# Patient Record
Sex: Male | Born: 1950
Health system: Southern US, Community
[De-identification: ages and names within clinical notes are randomized; demographics above are authoritative.]

## PROBLEM LIST (undated history)

## (undated) DIAGNOSIS — E785 Hyperlipidemia, unspecified: Secondary | ICD-10-CM

## (undated) DIAGNOSIS — E119 Type 2 diabetes mellitus without complications: Secondary | ICD-10-CM

## (undated) DIAGNOSIS — I10 Essential (primary) hypertension: Secondary | ICD-10-CM

## (undated) HISTORY — DX: Hyperlipidemia, unspecified: E78.5

## (undated) HISTORY — PX: NO PAST SURGERIES: SHX2092

---

## 2004-07-10 ENCOUNTER — Ambulatory Visit: Payer: Self-pay | Admitting: Family Medicine

## 2005-03-20 ENCOUNTER — Ambulatory Visit: Payer: Self-pay | Admitting: Family Medicine

## 2005-04-30 ENCOUNTER — Ambulatory Visit: Payer: Self-pay | Admitting: Family Medicine

## 2005-08-06 ENCOUNTER — Ambulatory Visit: Payer: Self-pay | Admitting: Family Medicine

## 2005-11-19 ENCOUNTER — Ambulatory Visit: Payer: Self-pay | Admitting: Family Medicine

## 2005-12-31 ENCOUNTER — Ambulatory Visit: Payer: Self-pay | Admitting: Family Medicine

## 2006-04-15 ENCOUNTER — Ambulatory Visit: Payer: Self-pay | Admitting: Family Medicine

## 2006-07-22 ENCOUNTER — Ambulatory Visit: Payer: Self-pay | Admitting: Family Medicine

## 2006-10-28 ENCOUNTER — Ambulatory Visit: Payer: Self-pay | Admitting: Family Medicine

## 2013-05-27 ENCOUNTER — Emergency Department (HOSPITAL_COMMUNITY)
Admission: EM | Admit: 2013-05-27 | Discharge: 2013-05-27 | Disposition: A | Discharge status: Home / Self Care | Payer: BC Managed Care – PPO | Attending: Emergency Medicine | Admitting: Emergency Medicine

## 2013-05-27 ENCOUNTER — Encounter (HOSPITAL_COMMUNITY): Payer: Self-pay | Admitting: Emergency Medicine

## 2013-05-27 DIAGNOSIS — E119 Type 2 diabetes mellitus without complications: Secondary | ICD-10-CM | POA: Insufficient documentation

## 2013-05-27 DIAGNOSIS — Z79899 Other long term (current) drug therapy: Secondary | ICD-10-CM | POA: Insufficient documentation

## 2013-05-27 DIAGNOSIS — K645 Perianal venous thrombosis: Secondary | ICD-10-CM

## 2013-05-27 DIAGNOSIS — K644 Residual hemorrhoidal skin tags: Secondary | ICD-10-CM | POA: Insufficient documentation

## 2013-05-27 DIAGNOSIS — I10 Essential (primary) hypertension: Secondary | ICD-10-CM | POA: Insufficient documentation

## 2013-05-27 DIAGNOSIS — Z7982 Long term (current) use of aspirin: Secondary | ICD-10-CM | POA: Insufficient documentation

## 2013-05-27 HISTORY — DX: Essential (primary) hypertension: I10

## 2013-05-27 HISTORY — DX: Type 2 diabetes mellitus without complications: E11.9

## 2013-05-27 LAB — CBC
HCT: 49.8 % (ref 39.0–52.0)
Hemoglobin: 18 g/dL — ABNORMAL HIGH (ref 13.0–17.0)
MCH: 35.5 pg — ABNORMAL HIGH (ref 26.0–34.0)
MCHC: 36.1 g/dL — ABNORMAL HIGH (ref 30.0–36.0)
MCV: 98.2 fL (ref 78.0–100.0)
Platelets: 159 10*3/uL (ref 150–400)
RBC: 5.07 MIL/uL (ref 4.22–5.81)
RDW: 13.2 % (ref 11.5–15.5)

## 2013-05-27 LAB — BASIC METABOLIC PANEL
BUN: 17 mg/dL (ref 6–23)
CO2: 26 mEq/L (ref 19–32)
Calcium: 9.7 mg/dL (ref 8.4–10.5)
Creatinine, Ser: 0.98 mg/dL (ref 0.50–1.35)
GFR calc Af Amer: >90 mL/min (ref >90)

## 2013-05-27 MED ORDER — HYDROCODONE-ACETAMINOPHEN 5-325 MG PO TABS: 1.0000 | ORAL_TABLET | Freq: Four times a day (QID) | ORAL | Status: DC | PRN

## 2013-05-27 MED ORDER — BISACODYL 5 MG PO TBEC: 5.0000 mg | DELAYED_RELEASE_TABLET | Freq: Two times a day (BID) | ORAL | Status: DC

## 2013-05-27 NOTE — ED Provider Notes (Addendum)
CSN: [id number]     Arrival date & time 05/27/13  7829 History   First MD Initiated Contact with Patient 05/27/13 1045     Chief Complaint  Patient presents with  . Hemorrhoids   (Consider location/radiation/quality/duration/timing/severity/associated sxs/prior Treatment) HPI Comments: Seen at [facility] hospital on tues and found to have hemorrhoids and started on anusol but not helping and pain getting worse.  States started after straining and doing a lot of heavy lifting lately with his job.  Patient is a 62 y.o. male presenting with hematochezia. The history is provided by the patient.  Rectal Bleeding Quality:  Bright red Amount:  Moderate Duration:  5 days Timing:  Constant Progression:  Worsening Chronicity:  Recurrent Context: hemorrhoids   Similar prior episodes: yes   Relieved by:  Nothing Worsened by:  Defecation and wiping Ineffective treatments:  Hemorrhoid cream Associated symptoms: no abdominal pain, no dizziness, no fever, no light-headedness, no loss of consciousness and no vomiting   Risk factors: no anticoagulant use, no hx of colorectal cancer, no hx of colorectal surgery, no liver disease and no steroid use     Past Medical History  Diagnosis Date  . Hypertension   . Diabetes mellitus without complication    History reviewed. No pertinent past surgical history. No family history on file. History  Substance Use Topics  . Smoking status: Never Smoker   . Smokeless tobacco: Not on file  . Alcohol Use: No    Review of Systems  Constitutional: Negative for fever.  Respiratory: Negative for cough, chest tightness and shortness of breath.   Cardiovascular: Negative for chest pain and leg swelling.  Gastrointestinal: Positive for hematochezia. Negative for vomiting and abdominal pain.  Neurological: Negative for dizziness, loss of consciousness, light-headedness and headaches.  All other systems reviewed and are negative.    Allergies  Review of  patient's allergies indicates no known allergies.  Home Medications   Current Outpatient Rx  Name  Route  Sig  Dispense  Refill  . aspirin EC 81 MG tablet   Oral   Take 81 mg by mouth daily.         Marland Kitchen atorvastatin (LIPITOR) 40 MG tablet   Oral   Take 40 mg by mouth at bedtime.         . benazepril (LOTENSIN) 40 MG tablet   Oral   Take 40 mg by mouth daily.         . Canagliflozin 300 MG TABS   Oral   Take 1 tablet by mouth daily.         Marland Kitchen glipiZIDE (GLUCOTROL) 10 MG tablet   Oral   Take 10 mg by mouth 2 (two) times daily before a meal.         . hydrochlorothiazide (MICROZIDE) 12.5 MG capsule   Oral   Take 12.5 mg by mouth daily.         . hydrocortisone (ANUSOL-HC) 25 MG suppository   Rectal   Place 25 mg rectally 2 (two) times daily.         Marland Kitchen losartan (COZAAR) 100 MG tablet   Oral   Take 100 mg by mouth daily.         . potassium chloride (K-DUR) 10 MEQ tablet   Oral   Take 10 mEq by mouth 2 (two) times daily.         . sertraline (ZOLOFT) 100 MG tablet   Oral   Take 100 mg by mouth daily.         Marland Kitchen  sitaGLIPtin-metformin (JANUMET) 50-1000 MG per tablet   Oral   Take 2 tablets by mouth 2 (two) times daily with a meal.          BP 203/93  Pulse 97  Temp(Src) 98.6 F (37 C) (Oral)  Resp 16  Ht 5\' 9"  (1.753 m)  Wt 179 lb (81.194 kg)  BMI 26.42 kg/m2  SpO2 96% Physical Exam  Nursing note and vitals reviewed. Constitutional: He is oriented to person, place, and time. He appears well-developed and well-nourished. No distress.  HENT:  Head: Normocephalic and atraumatic.  Eyes: EOM are normal. Pupils are equal, round, and reactive to light.  Cardiovascular: Normal rate and intact distal pulses.   Pulmonary/Chest: Effort normal.  Abdominal: Soft.  Genitourinary: Rectal exam shows external hemorrhoid.  Numerous large thrombosed external hemorrhoids small area of active bleeding from one of the hemorrhoids  Musculoskeletal: Normal  range of motion. He exhibits no edema and no tenderness.  Neurological: He is alert and oriented to person, place, and time.  Skin: Skin is warm and dry. No rash noted. No erythema.  Psychiatric: His behavior is normal.    ED Course  Procedures (including critical care time) Labs Review Labs Reviewed  CBC - Abnormal; Notable for the following:    WBC 14.4 (*)    Hemoglobin 18.0 (*)    MCH 35.5 (*)    MCHC 36.1 (*)    All other components within normal limits  BASIC METABOLIC PANEL - Abnormal; Notable for the following:    Potassium 3.4 (*)    Glucose, Bld 221 (*)    GFR calc non Af Amer 87 (*)    All other components within normal limits   Imaging Review No results found.  EKG Interpretation    Date/Time:  Friday May 27 2013 09:54:10 EST Ventricular Rate:  100 PR Interval:  200 QRS Duration: 88 QT Interval:  340 QTC Calculation: 438 R Axis:   26 Text Interpretation:  Normal sinus rhythm Nonspecific ST and T wave abnormality No previous tracing Confirmed by Anitra Lauth  MD, Christalyn Goertz ([id number]) on 05/27/2013 10:45:24 AM           INCISION AND DRAINAGE Performed by: Gwyneth Sprout Consent: Verbal consent obtained. Risks and benefits: risks, benefits and alternatives were discussed Type: thrombosed hemorrhoids  Body area: rectum  Anesthesia: local infiltration  Incision was made with a scalpel.  Local anesthetic: lidocaine 1% without epinephrine  Anesthetic total: 2 ml  Complexity:2 separate incisions made with clot expressed   Patient tolerance: Patient tolerated the procedure well with no immediate complications.    MDM   1. Thrombosed external hemorrhoid   2. Hypertension     Pt with significant thrombosed hemorrhoids.  Painful to palpation with mild bleeding.  Mild constipation reported by pt which may be cause.  Hemorrhoids lanced with expression of some clot but will give f/u with surgery.  Pt already has anusol at home.  Secondly pt with hx  of HTN and taking meds but noted to be HTN in triage and nurses ordered CBC and BMP and EKG however pt denies SOB, CP, fatigue, weakness or other complaints.  EKG abnormal without old to compare but pt not having any cardiac sx today.      Gwyneth Sprout, MD 05/27/13 1235  Gwyneth Sprout, MD 05/27/13 1610

## 2013-05-27 NOTE — ED Notes (Signed)
Pt reports hemorrhoids onset Monday. Pt seen at [facility] Tuesday night and given prescriptions but no relief. Pt BP 203/93 in triage, reports history of high blood pressure and takes his medications as ordered. Pt denies headache, dizziness or weakness at present.

## 2013-05-30 ENCOUNTER — Encounter (INDEPENDENT_AMBULATORY_CARE_PROVIDER_SITE_OTHER): Payer: Self-pay | Admitting: General Surgery

## 2013-05-30 ENCOUNTER — Encounter (HOSPITAL_COMMUNITY): Payer: Self-pay | Admitting: *Deleted

## 2013-05-30 ENCOUNTER — Inpatient Hospital Stay (HOSPITAL_COMMUNITY)
Admission: AD | Admit: 2013-05-30 | Discharge: 2013-06-06 | DRG: 395 | Disposition: A | Discharge status: Home / Self Care | Payer: BC Managed Care – PPO | Source: Ambulatory Visit | Attending: General Surgery | Admitting: General Surgery

## 2013-05-30 ENCOUNTER — Ambulatory Visit (INDEPENDENT_AMBULATORY_CARE_PROVIDER_SITE_OTHER): Payer: BC Managed Care – PPO | Admitting: General Surgery

## 2013-05-30 VITALS — BP 216/120 | HR 100 | Temp 97.9°F | Resp 15 | Ht 69.0 in | Wt 166.0 lb

## 2013-05-30 DIAGNOSIS — K5900 Constipation, unspecified: Secondary | ICD-10-CM | POA: Diagnosis present

## 2013-05-30 DIAGNOSIS — K644 Residual hemorrhoidal skin tags: Secondary | ICD-10-CM | POA: Insufficient documentation

## 2013-05-30 DIAGNOSIS — E119 Type 2 diabetes mellitus without complications: Secondary | ICD-10-CM | POA: Diagnosis present

## 2013-05-30 DIAGNOSIS — R142 Eructation: Secondary | ICD-10-CM | POA: Diagnosis present

## 2013-05-30 DIAGNOSIS — Z7982 Long term (current) use of aspirin: Secondary | ICD-10-CM

## 2013-05-30 DIAGNOSIS — R141 Gas pain: Secondary | ICD-10-CM | POA: Diagnosis present

## 2013-05-30 DIAGNOSIS — Z87891 Personal history of nicotine dependence: Secondary | ICD-10-CM

## 2013-05-30 DIAGNOSIS — I10 Essential (primary) hypertension: Secondary | ICD-10-CM | POA: Diagnosis present

## 2013-05-30 DIAGNOSIS — K645 Perianal venous thrombosis: Secondary | ICD-10-CM | POA: Diagnosis present

## 2013-05-30 DIAGNOSIS — K648 Other hemorrhoids: Secondary | ICD-10-CM

## 2013-05-30 DIAGNOSIS — Z79899 Other long term (current) drug therapy: Secondary | ICD-10-CM

## 2013-05-30 DIAGNOSIS — E785 Hyperlipidemia, unspecified: Secondary | ICD-10-CM | POA: Diagnosis present

## 2013-05-30 LAB — COMPREHENSIVE METABOLIC PANEL
AST: 15 U/L (ref 0–37)
Alkaline Phosphatase: 87 U/L (ref 39–117)
BUN: 16 mg/dL (ref 6–23)
CO2: 25 mEq/L (ref 19–32)
Chloride: 97 mEq/L (ref 96–112)
Creatinine, Ser: 0.93 mg/dL (ref 0.50–1.35)
GFR calc non Af Amer: 89 mL/min — ABNORMAL LOW (ref >90)
Potassium: 3.5 mEq/L (ref 3.5–5.1)
Sodium: 137 mEq/L (ref 135–145)
Total Bilirubin: 0.4 mg/dL (ref 0.3–1.2)

## 2013-05-30 LAB — CBC
HCT: 47.4 % (ref 39.0–52.0)
Hemoglobin: 16.9 g/dL (ref 13.0–17.0)
MCV: 96.3 fL (ref 78.0–100.0)
Platelets: 189 10*3/uL (ref 150–400)
RBC: 4.92 MIL/uL (ref 4.22–5.81)
RDW: 13 % (ref 11.5–15.5)
WBC: 13.6 10*3/uL — ABNORMAL HIGH (ref 4.0–10.5)

## 2013-05-30 LAB — GLUCOSE, CAPILLARY
Glucose-Capillary: 305 mg/dL — ABNORMAL HIGH (ref 70–99)
Glucose-Capillary: 69 mg/dL — ABNORMAL LOW (ref 70–99)

## 2013-05-30 LAB — PROTIME-INR: INR: 0.87 (ref 0.00–1.49)

## 2013-05-30 MED ORDER — INSULIN ASPART 100 UNIT/ML ~~LOC~~ SOLN
0.0000 [IU] | Freq: Three times a day (TID) | SUBCUTANEOUS | Status: DC
  Administered 2013-05-30: 19:00:00 via SUBCUTANEOUS

## 2013-05-30 MED ORDER — HYDROCHLOROTHIAZIDE 12.5 MG PO CAPS
12.5000 mg | ORAL_CAPSULE | Freq: Every day | ORAL | Status: DC
  Administered 2013-05-31: 12.5 mg via ORAL
  Filled 2013-05-30 (×2): qty 1

## 2013-05-30 MED ORDER — LOSARTAN POTASSIUM 50 MG PO TABS
100.0000 mg | ORAL_TABLET | Freq: Every day | ORAL | Status: DC
  Administered 2013-05-31: 100 mg via ORAL
  Filled 2013-05-30 (×2): qty 2

## 2013-05-30 MED ORDER — ONDANSETRON HCL 4 MG/2ML IJ SOLN
4.0000 mg | Freq: Four times a day (QID) | INTRAMUSCULAR | Status: DC | PRN
  Administered 2013-05-30: 4 mg via INTRAVENOUS
  Filled 2013-05-30: qty 2

## 2013-05-30 MED ORDER — METOPROLOL TARTRATE 1 MG/ML IV SOLN
5.0000 mg | Freq: Four times a day (QID) | INTRAVENOUS | Status: DC | PRN
  Administered 2013-05-31: 5 mg via INTRAVENOUS
  Filled 2013-05-30: qty 5

## 2013-05-30 MED ORDER — MORPHINE SULFATE 2 MG/ML IJ SOLN
2.0000 mg | INTRAMUSCULAR | Status: DC | PRN
  Administered 2013-05-30 – 2013-05-31 (×3): 2 mg via INTRAVENOUS
  Filled 2013-05-30 (×3): qty 1

## 2013-05-30 MED ORDER — POLYETHYLENE GLYCOL 3350 17 G PO PACK
17.0000 g | PACK | Freq: Once | ORAL | Status: AC
  Administered 2013-05-30: 17 g via ORAL
  Filled 2013-05-30: qty 1

## 2013-05-30 MED ORDER — PIPERACILLIN-TAZOBACTAM 3.375 G IVPB
3.3750 g | Freq: Three times a day (TID) | INTRAVENOUS | Status: DC
  Administered 2013-05-30 – 2013-06-01 (×5): 3.375 g via INTRAVENOUS
  Filled 2013-05-30 (×6): qty 50

## 2013-05-30 MED ORDER — BENAZEPRIL HCL 40 MG PO TABS
40.0000 mg | ORAL_TABLET | Freq: Every day | ORAL | Status: DC
  Administered 2013-05-31: 40 mg via ORAL
  Filled 2013-05-30 (×2): qty 1

## 2013-05-30 MED ORDER — INSULIN ASPART 100 UNIT/ML ~~LOC~~ SOLN
0.0000 [IU] | SUBCUTANEOUS | Status: DC
  Administered 2013-05-30: 11 [IU] via SUBCUTANEOUS
  Administered 2013-05-31: 3 [IU] via SUBCUTANEOUS
  Administered 2013-05-31 (×2): 5 [IU] via SUBCUTANEOUS
  Administered 2013-05-31: 16:00:00 via SUBCUTANEOUS
  Administered 2013-05-31: 8 [IU] via SUBCUTANEOUS
  Administered 2013-06-01: 2 [IU] via SUBCUTANEOUS
  Administered 2013-06-01: 3 [IU] via SUBCUTANEOUS

## 2013-05-30 MED ORDER — SODIUM CHLORIDE 0.9 % IV SOLN
INTRAVENOUS | Status: DC
  Administered 2013-05-30 – 2013-05-31 (×3): via INTRAVENOUS

## 2013-05-30 NOTE — Patient Instructions (Signed)
Go to [facility] °

## 2013-05-30 NOTE — Progress Notes (Signed)
Patient ID: Randy [name], male   DOB: 02-07-1951, 62 y.o.   MRN: [id number] He has necrotic, thrombosed internal and external hemorrhoids. There is foul-smelling drainage coming from these. I'm concerned they may be infected. He will be admitted to the hospital and started on IV antibiotics. We'll reevaluate in the morning and possibly do a hemorrhoidectomy.

## 2013-05-30 NOTE — Progress Notes (Signed)
Patient ID: Randy [name], male   DOB: Mar 18, 1951, 62 y.o.   MRN: [id number]  Chief Complaint  Patient presents with  .     throm hems     HPI Randy [name] is a 62 y.o. male.   HPI  He presents today to the urgent office for further evaluation and treatment of persistently painful external thrombosed hemorrhoids.  Initially began having a problem proximally 6 days ago and was seen at [facility]. He was started on Anusol but this did not help his symptoms. His pain and swelling began after he did a lot of straining and lifting on the job. He went to the emergency department 3 days ago where an incision and drainage of some clot was performed. He states he's not significantly better. He was noted to have a leukocytosis in the emergency department as well. He was sent to [place] for followup. He continues to have some bloody drainage and has severe pain. He's not had a bowel movement in 6 days.  Past Medical History  Diagnosis Date  . Hypertension   . Diabetes mellitus without complication   . Hyperlipidemia     History reviewed. No pertinent past surgical history.  History reviewed. No pertinent family history.  Social History History  Substance Use Topics  . Smoking status: Former Smoker    Quit date: 06/19/2011  . Smokeless tobacco: Never Used  . Alcohol Use: Yes     Comment: rarely    No Known Allergies  Current Outpatient Prescriptions  Medication Sig Dispense Refill  . aspirin EC 81 MG tablet Take 81 mg by mouth daily.      Marland Kitchen atorvastatin (LIPITOR) 40 MG tablet Take 40 mg by mouth at bedtime.      . benazepril (LOTENSIN) 40 MG tablet Take 40 mg by mouth daily.      . bisacodyl (DULCOLAX) 5 MG EC tablet Take 1 tablet (5 mg total) by mouth 2 (two) times daily.  14 tablet  0  . Canagliflozin 300 MG TABS Take 1 tablet by mouth daily.      Marland Kitchen glipiZIDE (GLUCOTROL) 10 MG tablet Take 10 mg by mouth 2 (two) times daily before a meal.      . hydrochlorothiazide  (MICROZIDE) 12.5 MG capsule Take 12.5 mg by mouth daily.      Marland Kitchen HYDROcodone-acetaminophen (NORCO/VICODIN) 5-325 MG per tablet Take 1 tablet by mouth every 6 (six) hours as needed.  6 tablet  0  . hydrocortisone (ANUSOL-HC) 25 MG suppository Place 25 mg rectally 2 (two) times daily.      Marland Kitchen losartan (COZAAR) 100 MG tablet Take 100 mg by mouth daily.      . potassium chloride (K-DUR) 10 MEQ tablet Take 10 mEq by mouth 2 (two) times daily.      . sertraline (ZOLOFT) 100 MG tablet Take 100 mg by mouth daily.      . sitaGLIPtin-metformin (JANUMET) 50-1000 MG per tablet Take 2 tablets by mouth 2 (two) times daily with a meal.       No current facility-administered medications for this visit.    Review of Systems Review of Systems  Constitutional: Negative for fever and chills.  Gastrointestinal: Positive for constipation, abdominal distention and rectal pain.  Genitourinary: Negative.     Blood pressure 216/120, pulse 100, temperature 97.9 F (36.6 C), temperature source Temporal, resp. rate 15, height 5\' 9"  (1.753 m), weight 166 lb (75.297 kg).  Physical Exam Physical Exam  Constitutional: He appears  well-developed and well-nourished. No distress.  HENT:  Head: Normocephalic and atraumatic.  Cardiovascular: Normal rate and regular rhythm.   Pulmonary/Chest: Effort normal and breath sounds normal.  Abdominal: Soft. He exhibits no distension and no mass. There is no tenderness.  Genitourinary:  There are necrotic prolapsed foul smelling hemorrhoids with some foul-smelling drainage present. There is no significant perianal erythema. On digital rectal exam I feel no masses but it is very uncomfortable for him.    Data Reviewed Emergency department note.  Assessment    Necrotic, thrombosed hemorrhoids. With the leukocytosis I'm concerned he could be trying to develop some anal sepsis.     Plan    Was admitted to the hospital start IV antibiotics. We'll give him some MiraLAX tonight.  We'll reevaluate the situation tomorrow and possibly taken to the operating room for debridement of necrotic hemorrhoids.        Randy [name] J 05/30/2013, 5:03 PM

## 2013-05-31 ENCOUNTER — Observation Stay (HOSPITAL_COMMUNITY): Payer: BC Managed Care – PPO

## 2013-05-31 LAB — GLUCOSE, CAPILLARY
Glucose-Capillary: 154 mg/dL — ABNORMAL HIGH (ref 70–99)
Glucose-Capillary: 199 mg/dL — ABNORMAL HIGH (ref 70–99)
Glucose-Capillary: 200 mg/dL — ABNORMAL HIGH (ref 70–99)
Glucose-Capillary: 225 mg/dL — ABNORMAL HIGH (ref 70–99)
Glucose-Capillary: 247 mg/dL — ABNORMAL HIGH (ref 70–99)

## 2013-05-31 LAB — CBC WITH DIFFERENTIAL/PLATELET
Basophils Absolute: 0.1 10*3/uL (ref 0.0–0.1)
Basophils Relative: 0 % (ref 0–1)
Eosinophils Absolute: 0.2 10*3/uL (ref 0.0–0.7)
Eosinophils Relative: 1 % (ref 0–5)
Hemoglobin: 15.8 g/dL (ref 13.0–17.0)
MCH: 33.9 pg (ref 26.0–34.0)
MCHC: 35 g/dL (ref 30.0–36.0)
Monocytes Absolute: 0.9 10*3/uL (ref 0.1–1.0)
Monocytes Relative: 7 % (ref 3–12)
Neutro Abs: 8.9 10*3/uL — ABNORMAL HIGH (ref 1.7–7.7)
Neutrophils Relative %: 71 % (ref 43–77)
RDW: 13.3 % (ref 11.5–15.5)

## 2013-05-31 MED ORDER — POLYETHYLENE GLYCOL 3350 17 G PO PACK
17.0000 g | PACK | Freq: Two times a day (BID) | ORAL | Status: DC
  Administered 2013-05-31: 17 g via ORAL
  Filled 2013-05-31 (×3): qty 1

## 2013-05-31 MED ORDER — HYDROCORTISONE 2.5 % RE CREA
TOPICAL_CREAM | Freq: Three times a day (TID) | RECTAL | Status: DC
  Administered 2013-05-31: 22:00:00 via RECTAL
  Filled 2013-05-31: qty 28.35

## 2013-05-31 MED ORDER — FLEET ENEMA 7-19 GM/118ML RE ENEM
1.0000 | ENEMA | Freq: Once | RECTAL | Status: AC
  Administered 2013-05-31: 133 via RECTAL
  Filled 2013-05-31: qty 1

## 2013-05-31 MED ORDER — DOCUSATE SODIUM 100 MG PO CAPS
100.0000 mg | ORAL_CAPSULE | Freq: Three times a day (TID) | ORAL | Status: DC
  Administered 2013-05-31: 100 mg via ORAL
  Filled 2013-05-31 (×4): qty 1

## 2013-05-31 NOTE — Progress Notes (Signed)
External strangulated hemorrhoids  Assessment: Improved somewhat; no BM for several days  Plan: Will give fleets today, and let him eat, NPO tonight and re-eval tomorrow for surgery vs continued non-operative management   Subjective: Much less pain today, notes no BM  Objective: Vital signs in last 24 hours: Temp:  [97.7 F (36.5 C)-98 F (36.7 C)] 97.9 F (36.6 C) (12/23 0544) Pulse Rate:  [77-104] 77 (12/23 0544) Resp:  [15-20] 18 (12/23 0544) BP: (150-225)/(67-125) 169/77 mmHg (12/23 0544) SpO2:  [93 %-98 %] 96 % (12/23 0544) Weight:  [166 lb (75.297 kg)] 166 lb (75.297 kg) (12/22 2100) Last BM Date: 05/27/13  Intake/Output from previous day: 12/22 0701 - 12/23 0700 In: 515 [P.O.:120; I.V.:395] Out: 1250 [Urine:1250]  General appearance: alert, cooperative and no distress Rectal: still has what appears to be bilateral necrotic hemorrhoidal tissue and some edematous but viable as well  Lab Results:  Results for orders placed during the hospital encounter of 05/30/13 (from the past 24 hour(s))  GLUCOSE, CAPILLARY     Status: Abnormal   Collection Time    05/30/13  5:52 PM      Result Value Range   Glucose-Capillary 170 (*) 70 - 99 mg/dL  CBC     Status: Abnormal   Collection Time    05/30/13  6:37 PM      Result Value Range   WBC 13.6 (*) 4.0 - 10.5 K/uL   RBC 4.92  4.22 - 5.81 MIL/uL   Hemoglobin 16.9  13.0 - 17.0 g/dL   HCT 62.1  30.8 - 65.7 %   MCV 96.3  78.0 - 100.0 fL   MCH 34.3 (*) 26.0 - 34.0 pg   MCHC 35.7  30.0 - 36.0 g/dL   RDW 84.6  96.2 - 95.2 %   Platelets 189  150 - 400 K/uL  COMPREHENSIVE METABOLIC PANEL     Status: Abnormal   Collection Time    05/30/13  6:37 PM      Result Value Range   Sodium 137  135 - 145 mEq/L   Potassium 3.5  3.5 - 5.1 mEq/L   Chloride 97  96 - 112 mEq/L   CO2 25  19 - 32 mEq/L   Glucose, Bld 167 (*) 70 - 99 mg/dL   BUN 16  6 - 23 mg/dL   Creatinine, Ser 8.41  0.50 - 1.35 mg/dL   Calcium 32.4  8.4 - 40.1 mg/dL   Total Protein 6.7  6.0 - 8.3 g/dL   Albumin 4.0  3.5 - 5.2 g/dL   AST 15  0 - 37 U/L   ALT 14  0 - 53 U/L   Alkaline Phosphatase 87  39 - 117 U/L   Total Bilirubin 0.4  0.3 - 1.2 mg/dL   GFR calc non Af Amer 89 (*) >90 mL/min   GFR calc Af Amer >90  >90 mL/min  PROTIME-INR     Status: None   Collection Time    05/30/13  6:37 PM      Result Value Range   Prothrombin Time 11.7  11.6 - 15.2 seconds   INR 0.87  0.00 - 1.49  GLUCOSE, CAPILLARY     Status: Abnormal   Collection Time    05/30/13  7:29 PM      Result Value Range   Glucose-Capillary 305 (*) 70 - 99 mg/dL  GLUCOSE, CAPILLARY     Status: Abnormal   Collection Time    05/30/13 11:36 PM  Result Value Range   Glucose-Capillary 69 (*) 70 - 99 mg/dL  GLUCOSE, CAPILLARY     Status: Abnormal   Collection Time    05/31/13 12:33 AM      Result Value Range   Glucose-Capillary 200 (*) 70 - 99 mg/dL  GLUCOSE, CAPILLARY     Status: Abnormal   Collection Time    05/31/13  1:21 AM      Result Value Range   Glucose-Capillary 199 (*) 70 - 99 mg/dL  GLUCOSE, CAPILLARY     Status: Abnormal   Collection Time    05/31/13  3:54 AM      Result Value Range   Glucose-Capillary 225 (*) 70 - 99 mg/dL  CBC WITH DIFFERENTIAL     Status: Abnormal   Collection Time    05/31/13  5:55 AM      Result Value Range   WBC 12.6 (*) 4.0 - 10.5 K/uL   RBC 4.66  4.22 - 5.81 MIL/uL   Hemoglobin 15.8  13.0 - 17.0 g/dL   HCT 16.1  09.6 - 04.5 %   MCV 96.8  78.0 - 100.0 fL   MCH 33.9  26.0 - 34.0 pg   MCHC 35.0  30.0 - 36.0 g/dL   RDW 40.9  81.1 - 91.4 %   Platelets 178  150 - 400 K/uL   Neutrophils Relative % 71  43 - 77 %   Neutro Abs 8.9 (*) 1.7 - 7.7 K/uL   Lymphocytes Relative 21  12 - 46 %   Lymphs Abs 2.6  0.7 - 4.0 K/uL   Monocytes Relative 7  3 - 12 %   Monocytes Absolute 0.9  0.1 - 1.0 K/uL   Eosinophils Relative 1  0 - 5 %   Eosinophils Absolute 0.2  0.0 - 0.7 K/uL   Basophils Relative 0  0 - 1 %   Basophils Absolute 0.1  0.0 -  0.1 K/uL  GLUCOSE, CAPILLARY     Status: Abnormal   Collection Time    05/31/13  8:06 AM      Result Value Range   Glucose-Capillary 154 (*) 70 - 99 mg/dL     Studies/Results Radiology     MEDS, Scheduled . benazepril  40 mg Oral Daily  . hydrochlorothiazide  12.5 mg Oral Daily  . insulin aspart  0-15 Units Subcutaneous Q4H  . losartan  100 mg Oral Daily  . piperacillin-tazobactam (ZOSYN)  IV  3.375 g Intravenous Q8H       LOS: 1 day    Currie Paris, MD, [facility], [place] [phone]   05/31/2013 8:49 AM

## 2013-05-31 NOTE — Progress Notes (Signed)
Inpatient Diabetes Program Recommendations  AACE/ADA: New Consensus Statement on Inpatient Glycemic Control (2013)  Target Ranges:  Prepandial:   less than 140 mg/dL      Peak postprandial:   less than 180 mg/dL (1-2 hours)      Critically ill patients:  140 - 180 mg/dL   Reason for Visit: Hyperglycemia Results for RUDELL, ORTMAN (MRN [id number]) as of 05/31/2013 16:26  Ref. Range 05/30/2013 17:52 05/30/2013 19:29 05/30/2013 23:36 05/31/2013 00:33 05/31/2013 01:21 05/31/2013 03:54 05/31/2013 08:06 05/31/2013 11:53 05/31/2013 16:08  Glucose-Capillary Latest Range: 70-99 mg/dL 409 (H) 811 (H) 69 (L) 200 (H) 199 (H) 225 (H) 154 (H) 247 (H) 144 (H)  Hx Type 2 DM on Janumet 50/1000 mg bid, glipizide 10 mg bid, Canaglifozin 300 mg/day.  Inpatient Diabetes Program Recommendations HgbA1C: Check HgbA1C to assess glycemic control prior to hospitalization Diet: When advanced, CHO mod med  Note: Will follow. Thank you. Ailene Ards, RD, LDN, CDE Inpatient Diabetes Coordinator [phone]

## 2013-05-31 NOTE — Progress Notes (Signed)
UR completed. Patient changed to inpatient r/t requiring IV antibiotics and IVF @ 100cc/hr 

## 2013-05-31 NOTE — Progress Notes (Signed)
INITIAL NUTRITION ASSESSMENT  DOCUMENTATION CODES Per approved criteria  -Not Applicable   INTERVENTION: Diet advancement per MD discretion Encourage PO intake RD to continue to monitor and will add Glucerna Shakes if needed  NUTRITION DIAGNOSIS: Unintentional weight loss related to medical condition as evidenced by 7% weight loss.   Goal: Pt to meet >/= 90% of their estimated nutrition needs   Monitor:  Diet advancement PO intake Weight Labs  Reason for Assessment: Malnutrition Screening Tool, score of 2  62 y.o. male  Admitting Dx: External strangulated hemorrhoids  ASSESSMENT: 62 y.o. male with history of hypertension, diabetes without complication, and hyperlipidemia. He presents for further evaluation and treatment of persistently painful external thrombosed hemorrhoids. Initially began having a problem proximally 6 days ago and was seen at [facility]. His pain and swelling began after he did a lot of straining and lifting on the job. He went to the emergency department 3 days ago where an incision and drainage of some clot was performed. He states he's not significantly better. He was noted to have a leukocytosis in the emergency department as well. He continues to have some bloody drainage and has severe pain. He's not had a bowel movement in 6 days.  Pt states that he usually weighs 178 lbs and he has lost weight unintentionally. Pt reports eating well PTA with 3 meals daily and a good appetite. Pt reports eating well today on full liquid diet and states he is still hungry. Based on patient's report he has lost 7% of his body weight.   Height: Ht Readings from Last 1 Encounters:  05/30/13 5\' 9"  (1.753 m)    Weight: Wt Readings from Last 1 Encounters:  05/30/13 166 lb (75.297 kg)    Ideal Body Weight: 160 lbs  % Ideal Body Weight: 104%  Wt Readings from Last 10 Encounters:  05/30/13 166 lb (75.297 kg)  05/30/13 166 lb (75.297 kg)  05/27/13 179 lb  (81.194 kg)    Usual Body Weight: 178 lbs  % Usual Body Weight: 93%  BMI:  Body mass index is 24.5 kg/(m^2).  Estimated Nutritional Needs: Kcal: 1900-2100 Protein: 80-90 grams Fluid: 2.1-2.3 L/day  Skin: intact  Diet Order: Full Liquid  EDUCATION NEEDS: -No education needs identified at this time   Intake/Output Summary (Last 24 hours) at 05/31/13 1438 Last data filed at 05/31/13 1400  Gross per 24 hour  Intake   1295 ml  Output   2775 ml  Net  -1480 ml    Last BM: 12/19  Labs:   Recent Labs Lab 05/27/13 1044 05/30/13 1837  NA 139 137  K 3.4* 3.5  CL 99 97  CO2 26 25  BUN 17 16  CREATININE 0.98 0.93  CALCIUM 9.7 10.0  GLUCOSE 221* 167*    CBG (last 3)   Recent Labs  05/31/13 0354 05/31/13 0806 05/31/13 1153  GLUCAP 225* 154* 247*    Scheduled Meds: . benazepril  40 mg Oral Daily  . hydrochlorothiazide  12.5 mg Oral Daily  . insulin aspart  0-15 Units Subcutaneous Q4H  . losartan  100 mg Oral Daily  . piperacillin-tazobactam (ZOSYN)  IV  3.375 g Intravenous Q8H    Continuous Infusions: . sodium chloride 75 mL/hr at 05/31/13 1323    Past Medical History  Diagnosis Date  . Hypertension   . Diabetes mellitus without complication   . Hyperlipidemia     Past Surgical History  Procedure Laterality Date  . No past surgeries  Pryor Ochoa RD, LDN Inpatient Clinical Dietitian Pager: [phone] After Hours Pager: [phone]

## 2013-05-31 NOTE — Progress Notes (Signed)
Pt evaluated at the request of Dr Jamey Ripa.  Pt has grade 4 hemorrhoids without necrosis.  Pain getting better.  Had a BM with an enema today.  Urinating without difficulty.  No signs of pelvic sepsis.  I do not think he requires surgery at this time.  Will order q4h sitz baths, anusol hemorrhoid cream, colace tid and miralax bid.  Possibly could d/c tom.  Pt should be re-evaluated in 3-4 weeks.

## 2013-06-01 LAB — GLUCOSE, CAPILLARY
Glucose-Capillary: 171 mg/dL — ABNORMAL HIGH (ref 70–99)
Glucose-Capillary: 156 mg/dL — ABNORMAL HIGH (ref 70–99)

## 2013-06-01 MED ORDER — POLYETHYLENE GLYCOL 3350 17 G PO PACK: 17.0000 g | PACK | Freq: Two times a day (BID) | ORAL | Status: DC

## 2013-06-01 MED ORDER — HYDROCORTISONE 2.5 % RE CREA: TOPICAL_CREAM | Freq: Three times a day (TID) | RECTAL | Status: DC

## 2013-06-01 MED ORDER — HYDROCORTISONE 2.5 % RE CREA
TOPICAL_CREAM | Freq: Four times a day (QID) | RECTAL | Status: DC
  Filled 2013-06-01: qty 28.35

## 2013-06-01 MED ORDER — BISACODYL 5 MG PO TBEC: 5.0000 mg | DELAYED_RELEASE_TABLET | Freq: Two times a day (BID) | ORAL | Status: DC

## 2013-06-01 MED ORDER — DSS 100 MG PO CAPS: 100.0000 mg | ORAL_CAPSULE | Freq: Three times a day (TID) | ORAL | Status: DC

## 2013-06-01 MED ORDER — HYDROCODONE-ACETAMINOPHEN 5-325 MG PO TABS: 1.0000 | ORAL_TABLET | ORAL | Status: DC | PRN

## 2013-06-07 ENCOUNTER — Telehealth (INDEPENDENT_AMBULATORY_CARE_PROVIDER_SITE_OTHER): Payer: Self-pay

## 2013-06-07 NOTE — Telephone Encounter (Signed)
Patient called to schedule P/O appt . With DR. Rosenbower  Nothing noted until the end of the month. Please advise. Patient states he is doing ok, he is doing the sitz baths and fiber intake as ordered, he had a large bm yesterday.

## 2013-06-08 ENCOUNTER — Encounter (HOSPITAL_COMMUNITY): Payer: Self-pay | Admitting: Emergency Medicine

## 2013-06-08 ENCOUNTER — Emergency Department (HOSPITAL_BASED_OUTPATIENT_CLINIC_OR_DEPARTMENT_OTHER): Payer: BC Managed Care – PPO | Admitting: Anesthesiology

## 2013-06-08 ENCOUNTER — Observation Stay (HOSPITAL_COMMUNITY)
Admission: EM | Admit: 2013-06-08 | Discharge: 2013-06-09 | Disposition: A | Discharge status: Home / Self Care | Payer: BC Managed Care – PPO | Attending: General Surgery | Admitting: General Surgery

## 2013-06-08 ENCOUNTER — Encounter (HOSPITAL_COMMUNITY)
Admission: EM | Disposition: A | Discharge status: Home / Self Care | Payer: Self-pay | Source: Home / Self Care | Attending: Emergency Medicine

## 2013-06-08 ENCOUNTER — Ambulatory Visit (HOSPITAL_BASED_OUTPATIENT_CLINIC_OR_DEPARTMENT_OTHER): Admit: 2013-06-08 | Payer: BC Managed Care – PPO | Admitting: General Surgery

## 2013-06-08 ENCOUNTER — Encounter (HOSPITAL_BASED_OUTPATIENT_CLINIC_OR_DEPARTMENT_OTHER): Payer: BC Managed Care – PPO | Admitting: Anesthesiology

## 2013-06-08 DIAGNOSIS — K644 Residual hemorrhoidal skin tags: Secondary | ICD-10-CM

## 2013-06-08 DIAGNOSIS — E119 Type 2 diabetes mellitus without complications: Secondary | ICD-10-CM | POA: Insufficient documentation

## 2013-06-08 DIAGNOSIS — K625 Hemorrhage of anus and rectum: Secondary | ICD-10-CM | POA: Insufficient documentation

## 2013-06-08 DIAGNOSIS — Z79899 Other long term (current) drug therapy: Secondary | ICD-10-CM | POA: Insufficient documentation

## 2013-06-08 DIAGNOSIS — Z7982 Long term (current) use of aspirin: Secondary | ICD-10-CM | POA: Insufficient documentation

## 2013-06-08 DIAGNOSIS — K648 Other hemorrhoids: Secondary | ICD-10-CM | POA: Insufficient documentation

## 2013-06-08 DIAGNOSIS — I10 Essential (primary) hypertension: Secondary | ICD-10-CM | POA: Insufficient documentation

## 2013-06-08 DIAGNOSIS — Z87891 Personal history of nicotine dependence: Secondary | ICD-10-CM | POA: Diagnosis not present

## 2013-06-08 DIAGNOSIS — E785 Hyperlipidemia, unspecified: Secondary | ICD-10-CM | POA: Insufficient documentation

## 2013-06-08 DIAGNOSIS — K649 Unspecified hemorrhoids: Secondary | ICD-10-CM

## 2013-06-08 HISTORY — PX: HEMORRHOID SURGERY: SHX153

## 2013-06-08 LAB — GLUCOSE, CAPILLARY
Glucose-Capillary: 149 mg/dL — ABNORMAL HIGH (ref 70–99)
Glucose-Capillary: 173 mg/dL — ABNORMAL HIGH (ref 70–99)
Glucose-Capillary: 204 mg/dL — ABNORMAL HIGH (ref 70–99)

## 2013-06-08 LAB — CBC WITH DIFFERENTIAL/PLATELET
Basophils Absolute: 0 10*3/uL (ref 0.0–0.1)
Eosinophils Absolute: 0.1 10*3/uL (ref 0.0–0.7)
HCT: 44.3 % (ref 39.0–52.0)
Hemoglobin: 16.1 g/dL (ref 13.0–17.0)
Lymphocytes Relative: 13 % (ref 12–46)
Monocytes Absolute: 1.1 10*3/uL — ABNORMAL HIGH (ref 0.1–1.0)
Monocytes Relative: 7 % (ref 3–12)
Neutro Abs: 11.7 10*3/uL — ABNORMAL HIGH (ref 1.7–7.7)
Neutrophils Relative %: 79 % — ABNORMAL HIGH (ref 43–77)
WBC: 14.8 10*3/uL — ABNORMAL HIGH (ref 4.0–10.5)

## 2013-06-08 LAB — BASIC METABOLIC PANEL
BUN: 19 mg/dL (ref 6–23)
CO2: 27 mEq/L (ref 19–32)
Calcium: 9.5 mg/dL (ref 8.4–10.5)
Chloride: 95 mEq/L — ABNORMAL LOW (ref 96–112)
Creatinine, Ser: 1.02 mg/dL (ref 0.50–1.35)
Potassium: 3.5 mEq/L — ABNORMAL LOW (ref 3.7–5.3)

## 2013-06-08 LAB — TYPE AND SCREEN
ABO/RH(D): O POS
Antibody Screen: NEGATIVE

## 2013-06-08 LAB — ABO/RH: ABO/RH(D): O POS

## 2013-06-08 SURGERY — HEMORRHOIDECTOMY
Anesthesia: General | Site: Anus

## 2013-06-08 MED ORDER — DIAZEPAM 5 MG PO TABS
5.0000 mg | ORAL_TABLET | Freq: Four times a day (QID) | ORAL | Status: DC | PRN
  Administered 2013-06-09 (×2): 5 mg via ORAL
  Filled 2013-06-08 (×3): qty 1

## 2013-06-08 MED ORDER — MIDAZOLAM HCL 2 MG/2ML IJ SOLN
INTRAMUSCULAR | Status: AC
  Filled 2013-06-08: qty 2

## 2013-06-08 MED ORDER — POTASSIUM CHLORIDE ER 10 MEQ PO TBCR
10.0000 meq | EXTENDED_RELEASE_TABLET | Freq: Two times a day (BID) | ORAL | Status: DC
  Administered 2013-06-08 – 2013-06-09 (×2): 10 meq via ORAL
  Filled 2013-06-08 (×4): qty 1

## 2013-06-08 MED ORDER — ENOXAPARIN SODIUM 40 MG/0.4ML ~~LOC~~ SOLN
40.0000 mg | SUBCUTANEOUS | Status: DC
  Administered 2013-06-09: 40 mg via SUBCUTANEOUS
  Filled 2013-06-08 (×3): qty 0.4

## 2013-06-08 MED ORDER — METFORMIN HCL 500 MG PO TABS
2000.0000 mg | ORAL_TABLET | Freq: Every day | ORAL | Status: DC
  Administered 2013-06-09: 2000 mg via ORAL
  Filled 2013-06-08 (×2): qty 4

## 2013-06-08 MED ORDER — SODIUM CHLORIDE 0.9 % IJ SOLN
3.0000 mL | Freq: Two times a day (BID) | INTRAMUSCULAR | Status: DC
  Filled 2013-06-08: qty 3

## 2013-06-08 MED ORDER — GLIPIZIDE 10 MG PO TABS
10.0000 mg | ORAL_TABLET | Freq: Two times a day (BID) | ORAL | Status: DC
  Administered 2013-06-09: 10 mg via ORAL
  Filled 2013-06-08 (×4): qty 1

## 2013-06-08 MED ORDER — PROMETHAZINE HCL 25 MG/ML IJ SOLN
6.2500 mg | INTRAMUSCULAR | Status: DC | PRN
  Filled 2013-06-08: qty 1

## 2013-06-08 MED ORDER — LIDOCAINE HCL (CARDIAC) 20 MG/ML IV SOLN: INTRAVENOUS | Status: DC | PRN

## 2013-06-08 MED ORDER — ATORVASTATIN CALCIUM 40 MG PO TABS
40.0000 mg | ORAL_TABLET | Freq: Every day | ORAL | Status: DC
Start: 2013-06-08 — End: 2013-06-09
  Administered 2013-06-08: 40 mg via ORAL
  Filled 2013-06-08 (×3): qty 1

## 2013-06-08 MED ORDER — DOCUSATE SODIUM 100 MG PO CAPS
100.0000 mg | ORAL_CAPSULE | Freq: Three times a day (TID) | ORAL | Status: DC
Start: 2013-06-08 — End: 2013-06-09
  Administered 2013-06-08 – 2013-06-09 (×3): 100 mg via ORAL
  Filled 2013-06-08 (×6): qty 1

## 2013-06-08 MED ORDER — KETOROLAC TROMETHAMINE 30 MG/ML IJ SOLN
INTRAMUSCULAR | Status: DC | PRN
  Administered 2013-06-08: 30 mg via INTRAVENOUS

## 2013-06-08 MED ORDER — PROPOFOL 10 MG/ML IV BOLUS
INTRAVENOUS | Status: DC | PRN
  Administered 2013-06-08: 200 mg via INTRAVENOUS

## 2013-06-08 MED ORDER — FENTANYL CITRATE 0.05 MG/ML IJ SOLN
INTRAMUSCULAR | Status: AC
  Filled 2013-06-08: qty 2

## 2013-06-08 MED ORDER — SODIUM CHLORIDE 0.9 % IV BOLUS (SEPSIS)
1000.0000 mL | Freq: Once | INTRAVENOUS | Status: AC
  Administered 2013-06-08 (×2): 1000 mL via INTRAVENOUS

## 2013-06-08 MED ORDER — MIDAZOLAM HCL 5 MG/5ML IJ SOLN
INTRAMUSCULAR | Status: DC | PRN
  Administered 2013-06-08: 2 mg via INTRAVENOUS

## 2013-06-08 MED ORDER — GLYCOPYRROLATE 0.2 MG/ML IJ SOLN
INTRAMUSCULAR | Status: DC | PRN
  Administered 2013-06-08: .5 mg via INTRAVENOUS

## 2013-06-08 MED ORDER — BUPIVACAINE LIPOSOME 1.3 % IJ SUSP
INTRAMUSCULAR | Status: DC | PRN
  Administered 2013-06-08: 20 mL

## 2013-06-08 MED ORDER — LOSARTAN POTASSIUM 50 MG PO TABS
100.0000 mg | ORAL_TABLET | Freq: Every day | ORAL | Status: DC
  Administered 2013-06-08 – 2013-06-09 (×2): 100 mg via ORAL
  Filled 2013-06-08 (×4): qty 2

## 2013-06-08 MED ORDER — OXYCODONE HCL 5 MG PO TABS
5.0000 mg | ORAL_TABLET | ORAL | Status: DC | PRN
  Administered 2013-06-09 (×3): 10 mg via ORAL
  Filled 2013-06-08 (×4): qty 2

## 2013-06-08 MED ORDER — INSULIN ASPART 100 UNIT/ML ~~LOC~~ SOLN
0.0000 [IU] | Freq: Three times a day (TID) | SUBCUTANEOUS | Status: DC
Start: 2013-06-08 — End: 2013-06-09
  Administered 2013-06-08: 3 [IU] via SUBCUTANEOUS
  Administered 2013-06-09: 8 [IU] via SUBCUTANEOUS
  Filled 2013-06-08: qty 0.15

## 2013-06-08 MED ORDER — FENTANYL CITRATE 0.05 MG/ML IJ SOLN
25.0000 ug | INTRAMUSCULAR | Status: DC | PRN
  Filled 2013-06-08: qty 1

## 2013-06-08 MED ORDER — SODIUM CHLORIDE 0.9 % IJ SOLN
3.0000 mL | INTRAMUSCULAR | Status: DC | PRN
  Filled 2013-06-08: qty 3

## 2013-06-08 MED ORDER — BISACODYL 5 MG PO TBEC
5.0000 mg | DELAYED_RELEASE_TABLET | Freq: Two times a day (BID) | ORAL | Status: DC
  Administered 2013-06-08 – 2013-06-09 (×2): 5 mg via ORAL
  Filled 2013-06-08 (×3): qty 1

## 2013-06-08 MED ORDER — LABETALOL HCL 5 MG/ML IV SOLN
INTRAVENOUS | Status: DC | PRN
  Administered 2013-06-08 (×3): 5 mg via INTRAVENOUS

## 2013-06-08 MED ORDER — HYDROCHLOROTHIAZIDE 12.5 MG PO CAPS
12.5000 mg | ORAL_CAPSULE | Freq: Every day | ORAL | Status: DC
  Administered 2013-06-08 – 2013-06-09 (×2): 12.5 mg via ORAL
  Filled 2013-06-08 (×5): qty 1

## 2013-06-08 MED ORDER — SITAGLIPTIN PHOS-METFORMIN HCL 50-1000 MG PO TABS: 2.0000 | ORAL_TABLET | Freq: Two times a day (BID) | ORAL | Status: DC

## 2013-06-08 MED ORDER — FENTANYL CITRATE 0.05 MG/ML IJ SOLN
INTRAMUSCULAR | Status: DC | PRN
  Administered 2013-06-08: 50 ug via INTRAVENOUS
  Administered 2013-06-08: 100 ug via INTRAVENOUS
  Administered 2013-06-08 (×3): 50 ug via INTRAVENOUS

## 2013-06-08 MED ORDER — BENAZEPRIL HCL 40 MG PO TABS
40.0000 mg | ORAL_TABLET | Freq: Every day | ORAL | Status: DC
  Administered 2013-06-08 – 2013-06-09 (×2): 40 mg via ORAL
  Filled 2013-06-08 (×5): qty 1

## 2013-06-08 MED ORDER — BUPIVACAINE-EPINEPHRINE 0.5% -1:200000 IJ SOLN
INTRAMUSCULAR | Status: DC | PRN
  Administered 2013-06-08: 5 mL

## 2013-06-08 MED ORDER — ACETAMINOPHEN 10 MG/ML IV SOLN
INTRAVENOUS | Status: DC | PRN
  Administered 2013-06-08: 1000 mg via INTRAVENOUS

## 2013-06-08 MED ORDER — FENTANYL CITRATE 0.05 MG/ML IJ SOLN
INTRAMUSCULAR | Status: AC
  Filled 2013-06-08: qty 4

## 2013-06-08 MED ORDER — POLYETHYLENE GLYCOL 3350 17 G PO PACK
17.0000 g | PACK | Freq: Two times a day (BID) | ORAL | Status: DC
  Administered 2013-06-08: 17 g via ORAL
  Filled 2013-06-08 (×4): qty 1

## 2013-06-08 MED ORDER — ROCURONIUM BROMIDE 100 MG/10ML IV SOLN
INTRAVENOUS | Status: DC | PRN
  Administered 2013-06-08: 40 mg via INTRAVENOUS
  Administered 2013-06-08 (×2): 10 mg via INTRAVENOUS

## 2013-06-08 MED ORDER — LINAGLIPTIN 5 MG PO TABS
5.0000 mg | ORAL_TABLET | Freq: Every day | ORAL | Status: DC
  Administered 2013-06-09: 5 mg via ORAL
  Filled 2013-06-08 (×2): qty 1

## 2013-06-08 MED ORDER — PSYLLIUM 95 % PO PACK
1.0000 | PACK | Freq: Two times a day (BID) | ORAL | Status: DC
  Administered 2013-06-08: 1 via ORAL
  Filled 2013-06-08 (×5): qty 1

## 2013-06-08 MED ORDER — INSULIN ASPART 100 UNIT/ML ~~LOC~~ SOLN
0.0000 [IU] | Freq: Every day | SUBCUTANEOUS | Status: DC
  Administered 2013-06-08: 2 [IU] via SUBCUTANEOUS
  Filled 2013-06-08: qty 0.05

## 2013-06-08 MED ORDER — NEOSTIGMINE METHYLSULFATE 1 MG/ML IJ SOLN
INTRAMUSCULAR | Status: DC | PRN
  Administered 2013-06-08: 3.5 mg via INTRAVENOUS

## 2013-06-08 MED ORDER — CANAGLIFLOZIN 300 MG PO TABS: 300.0000 mg | ORAL_TABLET | ORAL | Status: DC

## 2013-06-08 MED ORDER — CANAGLIFLOZIN 300 MG PO TABS
300.0000 mg | ORAL_TABLET | Freq: Every day | ORAL | Status: DC
  Administered 2013-06-09: 300 mg via ORAL
  Filled 2013-06-08 (×2): qty 1

## 2013-06-08 MED ORDER — SODIUM CHLORIDE 0.9 % IV SOLN
INTRAVENOUS | Status: DC | PRN
  Administered 2013-06-08: 14:00:00 via INTRAVENOUS

## 2013-06-08 MED ORDER — SERTRALINE HCL 100 MG PO TABS
100.0000 mg | ORAL_TABLET | Freq: Every day | ORAL | Status: DC
  Administered 2013-06-08 – 2013-06-09 (×2): 100 mg via ORAL
  Filled 2013-06-08 (×3): qty 1

## 2013-06-08 SURGICAL SUPPLY — 44 items
BLADE HEX COATED 2.75 (ELECTRODE) ×2 IMPLANT
BLADE SURG 10 STRL SS (BLADE) ×2 IMPLANT
BRIEF STRETCH FOR OB PAD LRG (UNDERPADS AND DIAPERS) ×3 IMPLANT
CLOTH BEACON ORANGE TIMEOUT ST (SAFETY) ×2 IMPLANT
COVER MAYO STAND STRL (DRAPES) ×2 IMPLANT
COVER TABLE BACK 60X90 (DRAPES) ×2 IMPLANT
DECANTER SPIKE VIAL GLASS SM (MISCELLANEOUS) ×2 IMPLANT
DRAPE PED LAPAROTOMY (DRAPES) ×2 IMPLANT
ELECT BLADE 6.5 .24CM SHAFT (ELECTRODE) ×2 IMPLANT
ELECT REM PT RETURN 9FT ADLT (ELECTROSURGICAL) ×2
ELECTRODE REM PT RTRN 9FT ADLT (ELECTROSURGICAL) ×1 IMPLANT
GAUZE SPONGE 4X4 16PLY XRAY LF (GAUZE/BANDAGES/DRESSINGS) ×2 IMPLANT
GLOVE BIO SURGEON STRL SZ 6.5 (GLOVE) ×2 IMPLANT
GLOVE BIOGEL M STER SZ 6 (GLOVE) ×1 IMPLANT
GLOVE BIOGEL PI IND STRL 6.5 (GLOVE) IMPLANT
GLOVE BIOGEL PI INDICATOR 6.5 (GLOVE) ×1
GLOVE INDICATOR 7.0 STRL GRN (GLOVE) ×2 IMPLANT
GOWN PREVENTION PLUS XXLARGE (GOWN DISPOSABLE) ×4 IMPLANT
GOWN STRL NON-REIN LRG LVL3 (GOWN DISPOSABLE) ×2 IMPLANT
NDL HYPO 25X1 1.5 SAFETY (NEEDLE) ×1 IMPLANT
NEEDLE HYPO 22GX1.5 SAFETY (NEEDLE) ×2 IMPLANT
NEEDLE HYPO 25X1 1.5 SAFETY (NEEDLE) ×2 IMPLANT
NS IRRIG 500ML POUR BTL (IV SOLUTION) ×2 IMPLANT
PACK BASIN DAY SURGERY FS (CUSTOM PROCEDURE TRAY) ×2 IMPLANT
PAD ABD 8X10 STRL (GAUZE/BANDAGES/DRESSINGS) ×1 IMPLANT
PENCIL BUTTON HOLSTER BLD 10FT (ELECTRODE) ×2 IMPLANT
SPONGE GAUZE 4X4 12PLY (GAUZE/BANDAGES/DRESSINGS) ×2 IMPLANT
SPONGE SURGIFOAM ABS GEL 100 (HEMOSTASIS) IMPLANT
SPONGE SURGIFOAM ABS GEL 12-7 (HEMOSTASIS) IMPLANT
STAPLER PROXIMATE HCS (STAPLE) IMPLANT
STAPLER VISISTAT 35W (STAPLE) ×2 IMPLANT
SUT CHROMIC 2 0 SH (SUTURE) ×2 IMPLANT
SUT CHROMIC 3 0 SH 27 (SUTURE) ×4 IMPLANT
SUT GUT CHROMIC 3 0 (SUTURE) IMPLANT
SUT PROLENE 2 0 BLUE (SUTURE) IMPLANT
SUT VIC AB 4-0 P-3 18XBRD (SUTURE) IMPLANT
SUT VIC AB 4-0 P3 18 (SUTURE)
SUT VIC AB 4-0 SH 18 (SUTURE) IMPLANT
SYR BULB IRRIGATION 50ML (SYRINGE) ×1 IMPLANT
SYR CONTROL 10ML LL (SYRINGE) ×2 IMPLANT
TRAY DSU PREP LF (CUSTOM PROCEDURE TRAY) ×2 IMPLANT
TUBE CONNECTING 12X1/4 (SUCTIONS) ×2 IMPLANT
WATER STERILE IRR 500ML POUR (IV SOLUTION) ×2 IMPLANT
YANKAUER SUCT BULB TIP NO VENT (SUCTIONS) ×2 IMPLANT

## 2013-06-08 NOTE — ED Notes (Signed)
Dr. Dierdre Highman made aware of BP  Per MD, surgical consult to address

## 2013-06-08 NOTE — H&P (Signed)
KERIC [name] is an 62 y.o. male.   Chief Complaint: rectal bleeding HPI: This patient presents for the 2nd time with prolapsed internal hemorrhoids.  He was recently hospitalized with grade 3 internal hemorrhoids.  He states that his symptoms were getting better until last night when he experienced severe bleeding with a BM.  His pain is now worse.  He reports regular BM's and no constipation.   Past Medical History  Diagnosis Date  . Hypertension   . Diabetes mellitus without complication   . Hyperlipidemia     Past Surgical History  Procedure Laterality Date  . No past surgeries      History reviewed. No pertinent family history. Social History:  reports that he quit smoking about 1 years ago. He has never used smokeless tobacco. He reports that he drinks alcohol. He reports that he does not use illicit drugs.  Allergies: No Known Allergies   (Not in a hospital admission)  Results for orders placed during the hospital encounter of 06/08/13 (from the past 48 hour(s))  ABO/RH     Status: None   Collection Time    06/08/13  5:16 AM      Result Value Range   ABO/RH(D) O POS    CBC WITH DIFFERENTIAL     Status: Abnormal   Collection Time    06/08/13  5:35 AM      Result Value Range   WBC 14.8 (*) 4.0 - 10.5 K/uL   RBC 4.60  4.22 - 5.81 MIL/uL   Hemoglobin 16.1  13.0 - 17.0 g/dL   HCT 16.1  09.6 - 04.5 %   MCV 96.3  78.0 - 100.0 fL   MCH 35.0 (*) 26.0 - 34.0 pg   MCHC 36.3 (*) 30.0 - 36.0 g/dL   RDW 40.9  81.1 - 91.4 %   Platelets 187  150 - 400 K/uL   Neutrophils Relative % 79 (*) 43 - 77 %   Neutro Abs 11.7 (*) 1.7 - 7.7 K/uL   Lymphocytes Relative 13  12 - 46 %   Lymphs Abs 1.9  0.7 - 4.0 K/uL   Monocytes Relative 7  3 - 12 %   Monocytes Absolute 1.1 (*) 0.1 - 1.0 K/uL   Eosinophils Relative 1  0 - 5 %   Eosinophils Absolute 0.1  0.0 - 0.7 K/uL   Basophils Relative 0  0 - 1 %   Basophils Absolute 0.0  0.0 - 0.1 K/uL  BASIC METABOLIC PANEL     Status: Abnormal     Collection Time    06/08/13  5:35 AM      Result Value Range   Sodium 137  137 - 147 mEq/L   Comment: Please note change in reference range.   Potassium 3.5 (*) 3.7 - 5.3 mEq/L   Comment: Please note change in reference range.   Chloride 95 (*) 96 - 112 mEq/L   CO2 27  19 - 32 mEq/L   Glucose, Bld 230 (*) 70 - 99 mg/dL   BUN 19  6 - 23 mg/dL   Creatinine, Ser 7.82  0.50 - 1.35 mg/dL   Calcium 9.5  8.4 - 95.6 mg/dL   GFR calc non Af Amer 77 (*) >90 mL/min   GFR calc Af Amer 90 (*) >90 mL/min   Comment: (NOTE)     The eGFR has been calculated using the CKD EPI equation.     This calculation has not been validated in all clinical  situations.     eGFR's persistently <90 mL/min signify possible Chronic Kidney     Disease.  TYPE AND SCREEN     Status: None   Collection Time    06/08/13  5:35 AM      Result Value Range   ABO/RH(D) O POS     Antibody Screen NEG     Sample Expiration 06/11/2013     No results found.  Review of Systems  Constitutional: Negative for fever and chills.  Respiratory: Negative for cough and shortness of breath.   Cardiovascular: Negative for chest pain.  Gastrointestinal: Positive for blood in stool. Negative for nausea, vomiting, abdominal pain, diarrhea and constipation.  Genitourinary: Negative for dysuria and flank pain.  Musculoskeletal: Negative for myalgias.  Neurological: Negative for headaches.    Blood pressure 190/94, pulse 92, temperature 97.9 F (36.6 C), temperature source Oral, resp. rate 18, SpO2 97.00%. Physical Exam  Constitutional: He is oriented to person, place, and time. He appears well-developed and well-nourished.  HENT:  Head: Normocephalic and atraumatic.  Eyes: Conjunctivae are normal. Pupils are equal, round, and reactive to light.  Neck: Normal range of motion.  Cardiovascular: Normal rate and regular rhythm.   Respiratory: Effort normal and breath sounds normal.  GI: Soft. Bowel sounds are normal. He exhibits no  distension. There is no tenderness.  Genitourinary: Rectal exam shows external hemorrhoid and internal hemorrhoid.     There is a necrotic R posterior internal hemorrhoid  Neurological: He is alert and oriented to person, place, and time.     Assessment/Plan Randy [name] is a 62 y.o. M with grade 4 hemorrhoids that are not responding to conservative treatment.  Given these new exam findings, I think he would benefit from a hemorrhoidectomy.  The risks and benefits were explained to the patient.  These include bleeding, significant pain, difficulty urinating and a slightly higher risk of anal stenosis.    Tessy Pawelski C. 06/08/2013, 8:30 AM

## 2013-06-08 NOTE — Anesthesia Postprocedure Evaluation (Signed)
  Anesthesia Post-op Note  Patient: Randy [name]  Procedure(s) Performed: Procedure(s) (LRB): HEMORRHOIDECTOMY (N/A)  Patient Location: PACU  Anesthesia Type: General  Level of Consciousness: awake and alert   Airway and Oxygen Therapy: Patient Spontanous Breathing  Post-op Pain: mild  Post-op Assessment: Post-op Vital signs reviewed, Patient's Cardiovascular Status Stable, Respiratory Function Stable, Patent Airway and No signs of Nausea or vomiting  Last Vitals:  Filed Vitals:   06/08/13 1700  BP:   Pulse: 66  Temp:   Resp: 15    Post-op Vital Signs: stable   Complications: No apparent anesthesia complications

## 2013-06-08 NOTE — Op Note (Signed)
06/08/2013  3:32 PM  PATIENT:  Randy [name]  62 y.o. male  Patient Care Team: Joette Catching, MD as PCP - General (Family Medicine)  PRE-OPERATIVE DIAGNOSIS:  External HEMORRHOIDS, Grade 4 internal hemorrhoids  POST-OPERATIVE DIAGNOSIS:  External HEMORRHOIDS, Grade 4 internal hemorrhoids   PROCEDURE: 2 column HEMORRHOIDECTOMY  SURGEON:  Surgeon(s): Romie Levee, MD  ASSISTANT: none   ANESTHESIA:   general  SPECIMEN:  Source of Specimen:  hemorrhoids  DISPOSITION OF SPECIMEN:  PATHOLOGY  COUNTS:  YES  PLAN OF CARE: Admit for overnight observation  PATIENT DISPOSITION:  PACU - hemodynamically stable.  INDICATION: This is a 62 y.o. M who presents to the ED with Grade 4 necrotic internal hemorrhoids.     OR FINDINGS: Necrotic R posterior and L lateral internal hemorrhoids.  Edematous external hemorrhoids  DESCRIPTION: the patient was identified in the preoperative holding area and taken to the OR where they were laid on the operating room table.  General anesthesia was induced without difficulty. The patient was then positioned in prone jackknife position with buttocks gently taped apart.  The patient was then prepped and draped in usual sterile fashion.  SCDs were noted to be in place prior to the initiation of anesthesia. A surgical timeout was performed indicating the correct patient, procedure, positioning and need for preoperative antibiotics.  I began with a digital rectal exam.   I then placed a Hill-Ferguson anoscope into the anal canal and evaluated this completely.  There were 2 areas of grade 4 internal hemorrhoids.  I began by resecting the R posterior hemorrhoid using scissors making sure to preserve the muscle underneath.  The area was closed using a 2-0 Chromic suture and a  3-0 Chromic was used to close the perianal skin.  I then replaced the [facility] and did the same for the L lateral column.  After good hemostasis was achieved, I re-evaluated the anal  canal.  I resected a posterior skin tag and closed this transversely using a 3-0 chromic suture.   30 ml of Exparel was then infused for a rectal block.  The anal canal was irrigated.  Hemostasis was good.  I was able to get 2 fingers into the anal canal after completion.

## 2013-06-08 NOTE — ED Notes (Signed)
VS updated PA made aware of BP

## 2013-06-08 NOTE — Progress Notes (Signed)
Utilization Review completed.  Ernestine Rohman RN CM  

## 2013-06-08 NOTE — Anesthesia Procedure Notes (Signed)
Procedure Name: Intubation Date/Time: 06/08/2013 2:08 PM Performed by: Maris Berger T Pre-anesthesia Checklist: Patient identified, Emergency Drugs available, Suction available and Patient being monitored Patient Re-evaluated:Patient Re-evaluated prior to inductionOxygen Delivery Method: Circle System Utilized Preoxygenation: Pre-oxygenation with 100% oxygen Intubation Type: IV induction Ventilation: Mask ventilation without difficulty Laryngoscope Size: Mac and 4 Grade View: Grade I Tube type: Oral Number of attempts: 1 Airway Equipment and Method: stylet Placement Confirmation: ETT inserted through vocal cords under direct vision,  positive ETCO2 and breath sounds checked- equal and bilateral Secured at: 23 cm Tube secured with: Tape Dental Injury: Teeth and Oropharynx as per pre-operative assessment

## 2013-06-08 NOTE — ED Provider Notes (Signed)
CSN: [id number]     Arrival date & time 06/08/13  0301 History   First MD Initiated Contact with Patient 06/08/13 0500     Chief Complaint  Patient presents with  . Hemorrhoids   HPI  History provided by the patient and recent medical chart. Patient is a 62 year old male with history of hypertension, hyperlipidemia and diabetes who presents with large amounts of bleeding from hemorrhoids. Patient states he has had a lot of difficulty with him related pain and bleeding recently. He was admitted to the hospital on December 22 for hemorrhoid bleeding. He was evaluated by general surgery at that time who were monitoring his hemorrhoids. They decided not to do any operative procedures and patient was treated with sitz baths and stool softeners. Patient has continued this while at home and states he has done generally well over the past 5 days. Last night patient states that he suddenly began to have large amounts of bleeding. He states he was having continuous bleeding from the rectum into his clothing for approximately 3 hours. Patient states he tried to use gauze and pressure to stop the bleeding but could not get it to stop. Patient has been taking MiraLax and Dulcolax as prescribed with soft stools. Denies any recent straining. He denies any shortness of breath, weakness or lightheadedness. No other aggravating or alleviating factors. No other associated symptoms    Past Medical History  Diagnosis Date  . Hypertension   . Diabetes mellitus without complication   . Hyperlipidemia    Past Surgical History  Procedure Laterality Date  . No past surgeries     History reviewed. No pertinent family history. History  Substance Use Topics  . Smoking status: Former Smoker -- 15 years    Quit date: 06/19/2011  . Smokeless tobacco: Never Used  . Alcohol Use: Yes     Comment: rarely    Review of Systems  Respiratory: Negative for shortness of breath.   Neurological: Negative for dizziness and  light-headedness.  All other systems reviewed and are negative.    Allergies  Review of patient's allergies indicates no known allergies.  Home Medications   Current Outpatient Rx  Name  Route  Sig  Dispense  Refill  . aspirin EC 81 MG tablet   Oral   Take 81 mg by mouth daily.         Marland Kitchen atorvastatin (LIPITOR) 40 MG tablet   Oral   Take 40 mg by mouth at bedtime.         . benazepril (LOTENSIN) 40 MG tablet   Oral   Take 40 mg by mouth daily.         . bisacodyl (DULCOLAX) 5 MG EC tablet   Oral   Take 1 tablet (5 mg total) by mouth 2 (two) times daily.   14 tablet   0   . Canagliflozin 300 MG TABS   Oral   Take 300 mg by mouth every morning.          . docusate sodium 100 MG CAPS   Oral   Take 100 mg by mouth 3 (three) times daily.   10 capsule   0   . FIBER SELECT GUMMIES PO   Oral   Take 1 tablet by mouth daily.         Marland Kitchen glipiZIDE (GLUCOTROL) 10 MG tablet   Oral   Take 10 mg by mouth 2 (two) times daily before a meal.         .  hydrochlorothiazide (MICROZIDE) 12.5 MG capsule   Oral   Take 12.5 mg by mouth daily.         Marland Kitchen HYDROcodone-acetaminophen (NORCO/VICODIN) 5-325 MG per tablet   Oral   Take 1 tablet by mouth every 6 (six) hours as needed for moderate pain.         . hydrocortisone (ANUSOL-HC) 2.5 % rectal cream   Rectal   Place rectally 3 (three) times daily.   30 g   0   . losartan (COZAAR) 100 MG tablet   Oral   Take 100 mg by mouth daily.         . polyethylene glycol (MIRALAX / GLYCOLAX) packet   Oral   Take 17 g by mouth 2 (two) times daily.   14 each   0   . potassium chloride (K-DUR) 10 MEQ tablet   Oral   Take 10 mEq by mouth 2 (two) times daily.         . sertraline (ZOLOFT) 100 MG tablet   Oral   Take 100 mg by mouth daily.         . sitaGLIPtin-metformin (JANUMET) 50-1000 MG per tablet   Oral   Take 2 tablets by mouth 2 (two) times daily with a meal.          BP 161/89  Pulse 115   Temp(Src) 97.9 F (36.6 C) (Oral)  Resp 20  SpO2 99% Physical Exam  Nursing note and vitals reviewed. Constitutional: He appears well-developed and well-nourished. No distress.  HENT:  Head: Normocephalic.  Cardiovascular: Regular rhythm.  Tachycardia present.   Pulmonary/Chest: Effort normal and breath sounds normal. No respiratory distress. He has no rales.  Abdominal: Soft.  Genitourinary:  Large external hemorrhoids with some tenderness. There appears to be slight prolapse as well the is easily pushed back.  There is some long blood clots present. There also appears to be some necrotic tissue. There is a small amount of fresh blood present. No large amounts of active bleeding. Patient cannot tolerate digital rectal exam well and is only able to reach a few cm. otherwise unremarkable  Neurological: He is alert.  Skin: Skin is warm.  Psychiatric: He has a normal mood and affect.    ED Course  Procedures  DIAGNOSTIC STUDIES: Oxygen Saturation is 99% on room air.    COORDINATION OF CARE:  Nursing notes reviewed. Vital signs reviewed. Initial pt interview and examination performed.   5:11 AM-patient seen and evaluated. Patient appears well no acute distress. He is slightly tachycardic. Describes a large amount of bleeding. Discussed work up plan with pt at bedside, which includes lab tests and possible surgery consult. Pt agrees with plan.  6:00AM spoke with Dr. Daphine Deutscher with Gen. surgery. He will put patient on the list for the surgery team to round on this morning and evaluate for possible further treatment.     MDM   1. Bleeding hemorrhoids        Angus Seller, PA-C 06/08/13 [phone]

## 2013-06-08 NOTE — Transfer of Care (Signed)
Immediate Anesthesia Transfer of Care Note  Patient: Randy [name]  Procedure(s) Performed: Procedure(s): HEMORRHOIDECTOMY (N/A)  Patient Location: PACU  Anesthesia Type:General  Level of Consciousness: awake and sedated  Airway & Oxygen Therapy: Patient Spontanous Breathing and Patient connected to nasal cannula oxygen  Post-op Assessment: Report given to PACU RN  Post vital signs: Reviewed and stable, 1st b/p up, Labetolol 5mg  IV.  Now 158/76  Complications: No apparent anesthesia complications

## 2013-06-08 NOTE — Anesthesia Preprocedure Evaluation (Addendum)
Anesthesia Evaluation  Patient identified by MRN, date of birth, ID band Patient awake    Reviewed: Allergy & Precautions, H&P , NPO status , Patient's Chart, lab work & pertinent test results  Airway Mallampati: II TM Distance: <3 FB Neck ROM: Full    Dental no notable dental hx.    Pulmonary neg pulmonary ROS, former smoker,  breath sounds clear to auscultation  Pulmonary exam normal       Cardiovascular hypertension, Pt. on medications Rhythm:Regular Rate:Normal     Neuro/Psych negative neurological ROS  negative psych ROS   GI/Hepatic negative GI ROS, Neg liver ROS,   Endo/Other  diabetes, Oral Hypoglycemic Agents  Renal/GU negative Renal ROS  negative genitourinary   Musculoskeletal negative musculoskeletal ROS (+)   Abdominal   Peds negative pediatric ROS (+)  Hematology negative hematology ROS (+)   Anesthesia Other Findings   Reproductive/Obstetrics negative OB ROS                          Anesthesia Physical Anesthesia Plan  ASA: II  Anesthesia Plan: General   Post-op Pain Management:    Induction: Intravenous  Airway Management Planned: Oral ETT  Additional Equipment:   Intra-op Plan:   Post-operative Plan: Extubation in OR  Informed Consent: I have reviewed the patients History and Physical, chart, labs and discussed the procedure including the risks, benefits and alternatives for the proposed anesthesia with the patient or authorized representative who has indicated his/her understanding and acceptance.   Dental advisory given  Plan Discussed with: CRNA and Surgeon  Anesthesia Plan Comments:         Anesthesia Quick Evaluation

## 2013-06-08 NOTE — ED Notes (Signed)
Pt complains of bleeding hemorroids tonight, was seen here last week for the same

## 2013-06-08 NOTE — ED Notes (Signed)
MD at bedside. Surgeon to see pt

## 2013-06-09 LAB — GLUCOSE, CAPILLARY: Glucose-Capillary: 286 mg/dL — ABNORMAL HIGH (ref 70–99)

## 2013-06-09 MED ORDER — PSYLLIUM 95 % PO PACK: 1.0000 | PACK | Freq: Every day | ORAL | Status: DC

## 2013-06-09 MED ORDER — DIAZEPAM 5 MG PO TABS: 5.0000 mg | ORAL_TABLET | Freq: Four times a day (QID) | ORAL | Status: DC | PRN

## 2013-06-09 MED ORDER — BISACODYL 5 MG PO TBEC: 5.0000 mg | DELAYED_RELEASE_TABLET | ORAL | Status: DC | PRN

## 2013-06-09 MED ORDER — LIDOCAINE 4 % EX CREA: TOPICAL_CREAM | Freq: Four times a day (QID) | CUTANEOUS | Status: DC | PRN

## 2013-06-09 MED ORDER — OXYCODONE HCL 5 MG PO TABS: 5.0000 mg | ORAL_TABLET | ORAL | Status: DC | PRN

## 2013-06-09 NOTE — Discharge Instructions (Signed)
ANORECTAL SURGERY: POST OP INSTRUCTIONS 1. Take your usually prescribed home medications unless otherwise directed. 2. DIET: During the first few hours after surgery sip on some liquids until you are able to urinate.  It is normal to not urinate for several hours after this surgery.  If you feel uncomfortable, please contact the office for instructions.  After you are able to urinate,you may eat, if you feel like it.  Follow a light bland diet the first 24 hours after arrival home, such as soup, liquids, crackers, etc.  Be sure to include lots of fluids daily (6-8 glasses).  Avoid fast food or heavy meals, as your are more likely to get nauseated.  Eat a low fat diet the next few days after surgery.  Limit caffeine intake to 1-2 servings a day. 3. PAIN CONTROL: a. Pain is best controlled by a usual combination of several different methods TOGETHER: i. Muscle relaxation 1.  Soak in a warm bath (or Sitz bath) three times a day and after bowel movements.  Continue to do this until all pain is resolved. 2. Take the muscle relaxer (Valium) every 6 hours for the first 2 days after surgery  ii. Over the counter pain medication iii. Prescription pain medication b. Most patients will experience some swelling and discomfort in the anus/rectal area and incisions.  Heat such as warm towels, sitz baths, warm baths, etc to help relax tight/sore spots and speed recovery.  Some people prefer to use ice, especially in the first couple days after surgery, as it may decrease the pain and swelling, or alternate between ice & heat.  Experiment to what works for you.  Swelling and bruising can take several weeks to resolve.  Pain can take even longer to completely resolve. c. It is helpful to take an over-the-counter pain medication regularly for the first few weeks.  Choose one of the following that works best for you: i. Naproxen (Aleve, etc)  Two 279m tabs twice a day ii. Ibuprofen (Advil, etc) Three 203mtabs four  times a day (every meal & bedtime) d. A  prescription for pain medication (such as percocet, oxycodone, hydrocodone, etc) should be given to you upon discharge.  Take your pain medication as prescribed.  i. If you are having problems/concerns with the prescription medicine (does not control pain, nausea, vomiting, rash, itching, etc), please call [phone] see if we need to switch you to a different pain medicine that will work better for you and/or control your side effect better. ii. If you need a refill on your pain medication, please contact your pharmacy.  They will contact our office to request authorization. Prescriptions will not be filled after 5 pm or on week-ends. 4. KEEP YOUR BOWELS REGULAR and AVOID CONSTIPATION a. The goal is one to two soft bowel movements a day.  You should at least have a bowel movement every other day. b. Avoid getting constipated.  Between the surgery and the pain medications, it is common to experience some constipation. This can be very painful after rectal surgery.  Increasing fluid intake and taking a fiber supplement (such as Metamucil, Citrucel, FiberCon, etc) 1-2 times a day regularly will usually help prevent this problem from occurring.  A stool softener like colace is also recommended.  This can be purchased over the counter at your pharmacy.  You can take it up to 3 times a day.  If you do not have a bowel movement after 24 hrs since your surgery,  take one does of milk of magnesia.  If you still haven't had a bowel movement 8-12 hours after that dose, take another dose.  If you don't have a bowel movement 48 hrs after surgery, purchase a Fleets enema from the drug store and administer gently per package instructions.  If you still are having trouble with your bowel movements after that, please call the office for further instructions. °c. If you develop diarrhea or have many loose bowel movements, simplify your diet to bland foods & liquids for a few  days.  Stop any stool softeners and decrease your fiber supplement.  Switching to mild anti-diarrheal medications (Kayopectate, Pepto Bismol) can help.  If this worsens or does not improve, please call us. ° °5. Wound Care °a. Remove your bandages before your first bowel movement or 8 hours after surgery.     °b. Remove any wound packing material at this tim,e as well.  You do not need to repack the wound unless instructed otherwise.  Wear an absorbent pad or soft cotton gauze in your underwear to catch any drainage and help keep the area clean. You should change this every 2-3 hours while awake. °c. Keep the area clean and dry.  Bathe / shower every day, especially after bowel movements.  Keep the area clean by showering / bathing over the incision / wound.   It is okay to soak an open wound to help wash it.  Wet wipes or showers / gentle washing after bowel movements is often less traumatic than regular toilet paper. °d. You may have some styrofoam-like soft packing in the rectum which will come out with the first bowel movement.  °e. You will often notice bleeding with bowel movements.  This should slow down by the end of the first week of surgery °f. Expect some drainage.  This should slow down, too, by the end of the first week of surgery.  Wear an absorbent pad or soft cotton gauze in your underwear until the drainage stops. °g. Do Not sit on a rubber or pillow ring.  This can make you symptoms worse.  You may sit on a soft pillow if needed.  °6. ACTIVITIES as tolerated:   °a. You may resume regular (light) daily activities beginning the next day--such as daily self-care, walking, climbing stairs--gradually increasing activities as tolerated.  If you can walk 30 minutes without difficulty, it is safe to try more intense activity such as jogging, treadmill, bicycling, low-impact aerobics, swimming, etc. °b. Save the most intensive and strenuous activity for last such as sit-ups, heavy lifting, contact sports,  etc  Refrain from any heavy lifting or straining until you are off narcotics for pain control.   °c. You may drive when you are no longer taking prescription pain medication, you can comfortably sit for long periods of time, and you can safely maneuver your car and apply brakes. °d. You may have sexual intercourse when it is comfortable.  °7. FOLLOW UP in our office °a. Please call [facility] at [phone] to set up an appointment to see your surgeon in the office for a follow-up appointment approximately 3-4 weeks after your surgery. °b. Make sure that you call for this appointment the day you arrive home to insure a convenient appointment time. °10. IF YOU HAVE DISABILITY OR FAMILY LEAVE FORMS, BRING THEM TO THE OFFICE FOR PROCESSING.  DO NOT GIVE THEM TO YOUR DOCTOR. ° ° ° ° °WHEN TO CALL US [phone]: °1. Poor pain control °  2. Reactions / problems with new medications (rash/itching, nausea, etc)  °3. Fever over 101.5 F (38.5 C) °4. Inability to urinate °5. Nausea and/or vomiting °6. Worsening swelling or bruising °7. Continued bleeding from incision. °8. Increased pain, redness, or drainage from the incision ° °The clinic staff is available to answer your questions during regular business hours (8:30am-5pm).  Please don’t hesitate to call and ask to speak to one of our nurses for clinical concerns.   A surgeon from [facility] is always on call at the hospitals °  °If you have a medical emergency, go to the nearest emergency room or call 911. °  ° °[facility] °[address], [address], [place], [place]  [address] ? °MAIN: [phone] ? TOLL FREE: [phone] ? °FAX [phone] °[email or web address] ° ° °

## 2013-06-09 NOTE — Discharge Summary (Signed)
Physician Discharge Summary  Patient ID: Randy [name] MRN: [id number] DOB/AGE: 12-Apr-1951 63 y.o.  Admit date: 06/08/2013 Discharge date: 06/09/2013  Admission Diagnoses: grade 4 internal hemorrhoids  Discharge Diagnoses:  Active Problems:   Bleeding hemorrhoids   Hemorrhoids, external, with complication   Discharged Condition: stable  Hospital Course: Patient was admitted for observation after emergent hemorrhoidectomy.  He was uncomfortable overnight but his pain was controlled.  He will continue his bowel regimen at home.  I will see him back in the office in 3 weeks.   Consults: None  Significant Diagnostic Studies: labs: cbc, chemistries  Treatments: surgery: hemorrhoidectomy  Discharge Exam: Blood pressure 190/100, pulse 108, temperature 97.7 F (36.5 C), temperature source Oral, resp. rate 18, height 5\' 9"  (1.753 m), weight 166 lb (75.297 kg), SpO2 94.00%. General appearance: alert and cooperative Incision/Wound: appears appropriate   Disposition: 01-Home or Self Care     Medication List    STOP taking these medications       HYDROcodone-acetaminophen 5-325 MG per tablet  Commonly known as:  NORCO/VICODIN     hydrocortisone 2.5 % rectal cream  Commonly known as:  ANUSOL-HC      TAKE these medications       aspirin EC 81 MG tablet  Take 81 mg by mouth daily.     atorvastatin 40 MG tablet  Commonly known as:  LIPITOR  Take 40 mg by mouth at bedtime.     benazepril 40 MG tablet  Commonly known as:  LOTENSIN  Take 40 mg by mouth daily.     bisacodyl 5 MG EC tablet  Commonly known as:  DULCOLAX  Take 1 tablet (5 mg total) by mouth every three (3) days as needed for moderate constipation.     Canagliflozin 300 MG Tabs  Take 300 mg by mouth every morning.     diazepam 5 MG tablet  Commonly known as:  VALIUM  Take 1 tablet (5 mg total) by mouth every 6 (six) hours as needed (inability to urinate, muscle spasms).     DSS 100 MG Caps  Take 100 mg  by mouth 3 (three) times daily.     FIBER SELECT GUMMIES PO  Take 1 tablet by mouth daily.     glipiZIDE 10 MG tablet  Commonly known as:  GLUCOTROL  Take 10 mg by mouth 2 (two) times daily before a meal.     hydrochlorothiazide 12.5 MG capsule  Commonly known as:  MICROZIDE  Take 12.5 mg by mouth daily.     lidocaine 4 % cream  Commonly known as:  LMX  Apply topically 4 (four) times daily as needed (pain).     losartan 100 MG tablet  Commonly known as:  COZAAR  Take 100 mg by mouth daily.     oxyCODONE 5 MG immediate release tablet  Commonly known as:  Oxy IR/ROXICODONE  Take 1-2 tablets (5-10 mg total) by mouth every 4 (four) hours as needed for severe pain.     polyethylene glycol packet  Commonly known as:  MIRALAX / GLYCOLAX  Take 17 g by mouth 2 (two) times daily.     potassium chloride 10 MEQ tablet  Commonly known as:  K-DUR  Take 10 mEq by mouth 2 (two) times daily.     psyllium 95 % Pack  Commonly known as:  HYDROCIL/METAMUCIL  Take 1 packet by mouth daily.     sertraline 100 MG tablet  Commonly known as:  ZOLOFT  Take 100  mg by mouth daily.     sitaGLIPtin-metformin 50-1000 MG per tablet  Commonly known as:  JANUMET  Take 2 tablets by mouth daily with breakfast.           Follow-up Information   Follow up with Rosario Adie., MD. Schedule an appointment as soon as possible for a visit in 3 weeks.   Specialty:  General Surgery   Contact information:   [address] [place] [address] [phone]       Signed: Rosario Adie 08/09/6120, 4:49 AM

## 2013-06-09 NOTE — ED Provider Notes (Signed)
Medical screening examination/treatment/procedure(s) were performed by non-physician practitioner and as supervising physician I was immediately available for consultation/collaboration.    Teressa Lower, MD 06/09/13 [phone]

## 2013-06-09 NOTE — Progress Notes (Signed)
Pt c/o feeling constipated. He is also bleeding from his rectum. Notified MD. She told me to inform pt that he was not constipated, that the pressure is feels in his rectum is the muscles being tight from surgery. She also informed me that it was normal for pt to have bleeding from surgery. I informed pt of this info. Pt also has increased bp. Ordered me to     Give him his 1000 bp meds now.

## 2013-06-09 NOTE — Progress Notes (Signed)
Pt had 2 sitz baths. One last night and one about 0400 this am.

## 2013-06-12 NOTE — Discharge Planning (Signed)
Physician Discharge Summary  Patient ID: Randy [name] MRN: [id number] DOB/AGE: 09-22-1950 63 y.o.  Admit date: 05/30/2013 Discharge date: 06/02/13   Admission Diagnoses:  Prolapsed hemorrhoids  Discharge Diagnoses:  same  Principal Problem:   External strangulated hemorrhoids   Surgery:  none  Discharged Condition: improved  Hospital Course:   Was admiitted for bedrest.  Was seen by Dr. Marcello Moores who recommeded nonoperative therapy.  Was ready to go home on Christmas day.   Consults: none  Significant Diagnostic Studies: none    Discharge Exam: Blood pressure 139/66, pulse 91, temperature 98.8 F (37.1 C), temperature source Oral, resp. rate 18, height 5\' 9"  (1.753 m), weight 166 lb (75.297 kg), SpO2 97.00%. Swelling was down but hemorrhoids were still out.    Disposition: 01-Home or Self Care  Discharge Orders   Future Appointments Provider Department Dept Phone   0/34/7425 9:56 PM Leighton Ruff, MD [facility], [place] [phone]   Future Orders Complete By Expires   Diet - low sodium heart healthy  As directed    Discharge instructions  As directed    Comments:     Sit in tub of water and Epsom salt 4 x a day   Increase activity slowly  As directed        Medication List    STOP taking these medications       bisacodyl 5 MG EC tablet  Commonly known as:  DULCOLAX     HYDROcodone-acetaminophen 5-325 MG per tablet  Commonly known as:  NORCO/VICODIN     hydrocortisone 25 MG suppository  Commonly known as:  ANUSOL-HC      TAKE these medications       aspirin EC 81 MG tablet  Take 81 mg by mouth daily.     atorvastatin 40 MG tablet  Commonly known as:  LIPITOR  Take 40 mg by mouth at bedtime.     benazepril 40 MG tablet  Commonly known as:  LOTENSIN  Take 40 mg by mouth daily.     Canagliflozin 300 MG Tabs  Take 300 mg by mouth every morning.     DSS 100 MG Caps  Take 100 mg by mouth 3 (three) times daily.     FIBER SELECT  GUMMIES PO  Take 1 tablet by mouth daily.     glipiZIDE 10 MG tablet  Commonly known as:  GLUCOTROL  Take 10 mg by mouth 2 (two) times daily before a meal.     hydrochlorothiazide 12.5 MG capsule  Commonly known as:  MICROZIDE  Take 12.5 mg by mouth daily.     losartan 100 MG tablet  Commonly known as:  COZAAR  Take 100 mg by mouth daily.     polyethylene glycol packet  Commonly known as:  MIRALAX / GLYCOLAX  Take 17 g by mouth 2 (two) times daily.     potassium chloride 10 MEQ tablet  Commonly known as:  K-DUR  Take 10 mEq by mouth 2 (two) times daily.     sertraline 100 MG tablet  Commonly known as:  ZOLOFT  Take 100 mg by mouth daily.         SignedPedro Earls 06/12/2013, 3:59 PM

## 2013-06-13 ENCOUNTER — Encounter (HOSPITAL_BASED_OUTPATIENT_CLINIC_OR_DEPARTMENT_OTHER): Payer: Self-pay | Admitting: General Surgery

## 2013-06-13 ENCOUNTER — Ambulatory Visit: Admit: 2013-06-13 | Payer: Self-pay | Admitting: General Surgery

## 2013-06-13 SURGERY — HEMORRHOIDECTOMY
Anesthesia: General

## 2013-06-20 ENCOUNTER — Telehealth (INDEPENDENT_AMBULATORY_CARE_PROVIDER_SITE_OTHER): Payer: Self-pay

## 2013-06-20 NOTE — Telephone Encounter (Signed)
Ok to refill lidocaine.  No valium.   Should do sitz baths if he is having trouble with spasms

## 2013-06-20 NOTE — Telephone Encounter (Signed)
Rx for Lidocaine 5%, 1 tube w/ no refills called to [facility].  Pt was made aware.

## 2013-06-20 NOTE — Telephone Encounter (Signed)
Spoke to [place] who does not have a prescription for this patient from Friday.  It is showing under the medication list that these were last filled on 06/09/13.  Are you ok to refill both of these?  I know the Lidocaine can be escribed but then we can write for the Valium?

## 2013-06-20 NOTE — Telephone Encounter (Signed)
I signed a Rx for him on Friday.  [facility] should have it.

## 2013-06-20 NOTE — Telephone Encounter (Signed)
Pt is s/p hemorrhoidectomy on 06/08/14 by Dr. Marcello Moores.  He is calling today to request refill for Lidocaine ointment and Valium.  I told him that I would pass his request on to Dr. Marcello Moores, and as soon as I heard from her I would call him back.  Pt understood and agreed.

## 2013-06-21 NOTE — Telephone Encounter (Signed)
No notes in this encounter. 

## 2013-07-06 ENCOUNTER — Encounter (INDEPENDENT_AMBULATORY_CARE_PROVIDER_SITE_OTHER): Payer: Self-pay

## 2013-07-06 ENCOUNTER — Encounter (INDEPENDENT_AMBULATORY_CARE_PROVIDER_SITE_OTHER): Payer: Self-pay | Admitting: General Surgery

## 2013-07-06 ENCOUNTER — Ambulatory Visit (INDEPENDENT_AMBULATORY_CARE_PROVIDER_SITE_OTHER): Payer: BC Managed Care – PPO | Admitting: General Surgery

## 2013-07-06 VITALS — BP 132/70 | HR 78 | Temp 97.0°F | Ht 69.0 in | Wt 162.0 lb

## 2013-07-06 DIAGNOSIS — Z9889 Other specified postprocedural states: Secondary | ICD-10-CM

## 2013-07-06 NOTE — Patient Instructions (Signed)
Continue fiber and stool softeners.  Use lidocaine as needed.  Use laxatives if you haven't had a BM in several days.

## 2013-07-06 NOTE — Progress Notes (Signed)
Randy [name] is a 63 y.o. male who is status post a hemorrhoidectomy on 06/13/13.  He is doing better.  He is using the lidocaine and sitz baths for BM's.  He is taking his fiber and stool softeners.  He denies any bleeding  Objective: There were no vitals filed for this visit.  General appearance: alert and cooperative  Incision: healing well, posterior anal wall still healing   Assessment: s/p  Patient Active Problem List   Diagnosis Date Noted  . Bleeding hemorrhoids 06/08/2013  . Hemorrhoids, external, with complication 93/79/0240  . Internal and external strangulated hemorrhoids 05/30/2013  . External strangulated hemorrhoids 05/30/2013    Plan: Continue current management.  RTO in 1 month.    Rosario Adie, [facility], [place]   07/06/2013 2:42 PM

## 2013-08-08 ENCOUNTER — Encounter (INDEPENDENT_AMBULATORY_CARE_PROVIDER_SITE_OTHER): Payer: Self-pay | Admitting: General Surgery

## 2013-08-08 ENCOUNTER — Ambulatory Visit (INDEPENDENT_AMBULATORY_CARE_PROVIDER_SITE_OTHER): Payer: BC Managed Care – PPO | Admitting: General Surgery

## 2013-08-08 VITALS — BP 134/78 | HR 78 | Temp 98.7°F | Resp 16 | Ht 69.0 in | Wt 171.0 lb

## 2013-08-08 DIAGNOSIS — Z9889 Other specified postprocedural states: Secondary | ICD-10-CM

## 2013-08-08 NOTE — Progress Notes (Signed)
Mehul Rudin is a 63 y.o. male who is status post a hemorrhoidectomy on 06/13/13. He is doing better.  He is taking his fiber and stool softeners. He denies any bleeding or pain with BM's Objective:  Filed Vitals:   08/08/13 1421  BP: 134/78  Pulse: 78  Temp: 98.7 F (37.1 C)  Resp: 16   General appearance: alert and cooperative  Incision: healing well, posterior anal wall slightly scarred, no anal stenosis, able to admit standard anoscope.  Mild posterior inflammation  Assessment:  s/p  Patient Active Problem List    Diagnosis  Date Noted   .  Bleeding hemorrhoids  06/08/2013   .  Hemorrhoids, external, with complication  76/16/0737   .  Internal and external strangulated hemorrhoids  05/30/2013   .  External strangulated hemorrhoids  05/30/2013   Plan:  Continue fiber indefinitely.  RTO PRN   .Rosario Adie, MD  [facility], [place]

## 2013-08-08 NOTE — Patient Instructions (Signed)
Continue fiber supplement indefinitely.  You may use a stool softener like colace if you still have hard stools with the fiber.  Do not use any laxative on a daily basis.

## 2015-01-06 LAB — HM DIABETES EYE EXAM

## 2015-03-25 LAB — HEMOGLOBIN A1C: HEMOGLOBIN A1C: 8.2 % — AB (ref 4.0–6.0)

## 2015-05-09 ENCOUNTER — Encounter: Payer: Self-pay | Admitting: "Endocrinology

## 2015-05-09 ENCOUNTER — Ambulatory Visit (INDEPENDENT_AMBULATORY_CARE_PROVIDER_SITE_OTHER): Payer: 59 | Admitting: "Endocrinology

## 2015-05-09 VITALS — BP 186/82 | HR 84 | Wt 172.4 lb

## 2015-05-09 DIAGNOSIS — E1169 Type 2 diabetes mellitus with other specified complication: Secondary | ICD-10-CM | POA: Insufficient documentation

## 2015-05-09 DIAGNOSIS — E785 Hyperlipidemia, unspecified: Secondary | ICD-10-CM | POA: Diagnosis not present

## 2015-05-09 DIAGNOSIS — E1165 Type 2 diabetes mellitus with hyperglycemia: Secondary | ICD-10-CM

## 2015-05-09 DIAGNOSIS — IMO0002 Reserved for concepts with insufficient information to code with codable children: Secondary | ICD-10-CM

## 2015-05-09 DIAGNOSIS — E118 Type 2 diabetes mellitus with unspecified complications: Secondary | ICD-10-CM

## 2015-05-09 DIAGNOSIS — I10 Essential (primary) hypertension: Secondary | ICD-10-CM

## 2015-05-09 DIAGNOSIS — Z794 Long term (current) use of insulin: Secondary | ICD-10-CM | POA: Diagnosis not present

## 2015-05-09 DIAGNOSIS — I152 Hypertension secondary to endocrine disorders: Secondary | ICD-10-CM | POA: Insufficient documentation

## 2015-05-09 DIAGNOSIS — E782 Mixed hyperlipidemia: Secondary | ICD-10-CM | POA: Insufficient documentation

## 2015-05-09 MED ORDER — INSULIN GLARGINE 100 UNIT/ML SOLOSTAR PEN: 30.0000 [IU] | PEN_INJECTOR | Freq: Every day | SUBCUTANEOUS | Status: DC

## 2015-05-09 NOTE — Progress Notes (Signed)
Subjective:    Patient ID: Randy [name], male    DOB: 08-21-1950, PCP Sherrie Mustache, MD   Past Medical History  Diagnosis Date  . Hypertension   . Diabetes mellitus without complication ([place])   . Hyperlipidemia    Past Surgical History  Procedure Laterality Date  . No past surgeries    . Hemorrhoid surgery N/A 06/08/2013    Procedure: HEMORRHOIDECTOMY;  Surgeon: Leighton Ruff, MD;  Location: [facility];  Service: General;  Laterality: N/A;   Social History   Social History  . Marital Status: Single    Spouse Name: N/A  . Number of Children: N/A  . Years of Education: N/A   Social History Main Topics  . Smoking status: Former Smoker -- 15 years    Quit date: 06/19/2011  . Smokeless tobacco: Never Used  . Alcohol Use: Yes     Comment: rarely  . Drug Use: No  . Sexual Activity: Not Asked   Other Topics Concern  . None   Social History Narrative   Outpatient Encounter Prescriptions as of 05/09/2015  Medication Sig  . metFORMIN (GLUCOPHAGE) 1000 MG tablet Take 1,000 mg by mouth 2 (two) times daily with a meal.  . metFORMIN (GLUCOPHAGE) 1000 MG tablet Take 1,000 mg by mouth 2 (two) times daily with a meal.  . aspirin EC 81 MG tablet Take 81 mg by mouth daily.  Marland Kitchen atorvastatin (LIPITOR) 40 MG tablet Take 40 mg by mouth at bedtime.  . benazepril (LOTENSIN) 40 MG tablet Take 40 mg by mouth daily.  . bisacodyl (DULCOLAX) 5 MG EC tablet Take 1 tablet (5 mg total) by mouth every three (3) days as needed for moderate constipation. (Patient not taking: Reported on 05/09/2015)  . Canagliflozin 300 MG TABS Take 300 mg by mouth every morning.   . diazepam (VALIUM) 5 MG tablet Take 1 tablet (5 mg total) by mouth every 6 (six) hours as needed (inability to urinate, muscle spasms). (Patient not taking: Reported on 05/09/2015)  . docusate sodium 100 MG CAPS Take 100 mg by mouth 3 (three) times daily. (Patient not taking: Reported on 05/09/2015)  . FIBER  SELECT GUMMIES PO Take 1 tablet by mouth daily.  . hydrochlorothiazide (MICROZIDE) 12.5 MG capsule Take 12.5 mg by mouth daily.  . Insulin Glargine (LANTUS SOLOSTAR) 100 UNIT/ML Solostar Pen Inject 30 Units into the skin daily at 10 pm.  . lidocaine (LMX) 4 % cream Apply topically 4 (four) times daily as needed (pain). (Patient not taking: Reported on 05/09/2015)  . losartan (COZAAR) 100 MG tablet Take 100 mg by mouth daily.  Marland Kitchen oxyCODONE (OXY IR/ROXICODONE) 5 MG immediate release tablet Take 1-2 tablets (5-10 mg total) by mouth every 4 (four) hours as needed for severe pain. (Patient not taking: Reported on 05/09/2015)  . polyethylene glycol (MIRALAX / GLYCOLAX) packet Take 17 g by mouth 2 (two) times daily. (Patient not taking: Reported on 05/09/2015)  . potassium chloride (K-DUR) 10 MEQ tablet Take 10 mEq by mouth 2 (two) times daily.  . psyllium (HYDROCIL/METAMUCIL) 95 % PACK Take 1 packet by mouth daily. (Patient not taking: Reported on 05/09/2015)  . sertraline (ZOLOFT) 100 MG tablet Take 100 mg by mouth daily.  . [DISCONTINUED] glipiZIDE (GLUCOTROL) 10 MG tablet Take 10 mg by mouth 2 (two) times daily before a meal.  . [DISCONTINUED] sitaGLIPtin-metformin (JANUMET) 50-1000 MG per tablet Take 2 tablets by mouth daily with breakfast.   No facility-administered encounter medications on file  as of 05/09/2015.   ALLERGIES: No Known Allergies VACCINATION STATUS: Immunization History  Administered Date(s) Administered  . Influenza,inj,Quad PF,36+ Mos 03/02/2014    Diabetes He presents for his follow-up diabetic visit. He has type 2 diabetes mellitus. Onset time: He was diagnosed at approximate age of 57 years. His disease course has been improving. There are no hypoglycemic associated symptoms. There are no diabetic associated symptoms. There are no hypoglycemic complications. Symptoms are improving. There are no diabetic complications. Risk factors for coronary artery disease include  dyslipidemia, diabetes mellitus, hypertension, sedentary lifestyle and male sex. Current diabetic treatment includes insulin injections and oral agent (monotherapy). He is compliant with treatment most of the time. He participates in exercise intermittently. His overall blood glucose range is 180-200 mg/dl. An ACE inhibitor/angiotensin II receptor blocker is being taken.  Hyperlipidemia This is a chronic problem. The current episode started more than 1 year ago. Current antihyperlipidemic treatment includes statins. Risk factors for coronary artery disease include diabetes mellitus, dyslipidemia, hypertension and male sex.  Hypertension This is a chronic problem. The current episode started more than 1 year ago. Past treatments include ACE inhibitors.     Review of Systems  Objective:    BP 186/82 mmHg  Pulse 84  Wt 172 lb 6 oz (78.189 kg)  SpO2 95%  Wt Readings from Last 3 Encounters:  05/09/15 172 lb 6 oz (78.189 kg)  08/08/13 171 lb (77.565 kg)  07/06/13 162 lb (73.483 kg)    Physical Exam  Results for orders placed or performed in visit on 05/09/15  Hemoglobin A1c  Result Value Ref Range   Hgb A1c MFr Bld 8.2 (A) 4.0 - 6.0 %  HM DIABETES EYE EXAM  Result Value Ref Range   HM Diabetic Eye Exam No Retinopathy No Retinopathy   Complete Blood Count (Most recent): Lab Results  Component Value Date   WBC 14.8* 06/08/2013   HGB 16.1 06/08/2013   HCT 44.3 06/08/2013   MCV 96.3 06/08/2013   PLT 187 06/08/2013   Chemistry (most recent): Lab Results  Component Value Date   NA 137 06/08/2013   K 3.5* 06/08/2013   CL 95* 06/08/2013   CO2 27 06/08/2013   BUN 19 06/08/2013   CREATININE 1.02 06/08/2013   Diabetic Labs (most recent): Lab Results  Component Value Date   HGBA1C 8.2* 03/25/2015   Lipid profile (most recent): No results found for: TRIG, CHOL       Assessment & Plan:   1. Uncontrolled type 2 diabetes mellitus with complication, with long-term current use  of insulin ([place])   - patient remains at a high risk for more acute and chronic complications of diabetes which include CAD, CVA, CKD, retinopathy, and neuropathy. These are all discussed in detail with the patient.  Patient came with controlled fasting glucose profile, and  recent A1c of 8.2% improving from 10.1 %.  Glucose logs and insulin administration records pertaining to this visit,  to be scanned into patient's records.  Recent labs reviewed.   - I have re-counseled the patient on diet management   by adopting a carbohydrate restricted / protein rich  Diet.  - Suggestion is made for patient to avoid simple carbohydrates   from their diet including Cakes , Desserts, Ice Cream,  Soda (  diet and regular) , Sweet Tea , Candies,  Chips, Cookies, Artificial Sweeteners,   and "Sugar-free" Products .  This will help patient to have stable blood glucose profile and potentially avoid unintended  Weight gain.  - Patient is advised to stick to a routine mealtimes to eat 3 meals  a day and avoid unnecessary snacks ( to snack only to correct hypoglycemia).   - I have approached patient with the following individualized plan to manage diabetes and patient agrees.  - I will increase Tresiba to 30 units qhs ( prescribed Lantus due to insurance not paying for [place] nor toujeo), associated with monitoring of BG AC breakfast and at bed time.  I will continue Metformin 1gm po BID.  - Patient specific target  for A1c; LDL, HDL, Triglycerides, and  Waist Circumference were discussed in detail.  2) BP/HTN: Uncontrolled, he took his medications late today. Continue current medications including ACEI/ARB. 3) Lipids/HPL:  continue statins. 4)  Weight/Diet: CDE consult in progress, exercise, and carbohydrates information provided.  5) Chronic Care/Health Maintenance:  -Patient is on ACEI/ARB and Statin medications and encouraged to continue to follow up with Ophthalmology, Podiatrist at least yearly or  according to recommendations, and advised to  stay away from smoking. I have recommended yearly flu vaccine and pneumonia vaccination at least every 5 years; moderate intensity exercise for up to 150 minutes weekly; and  sleep for at least 7 hours a day.  - 25 minutes of time was spent on the care of this patient , 50% of which was applied for counseling on diabetes complications and their preventions.  - I advised patient to maintain close follow up with Sherrie Mustache, MD for primary care needs.  Patient is asked to bring meter and  blood glucose logs during their next visit.   Follow up plan: -Return in about 3 months (around 08/07/2015) for diabetes, high blood pressure, high cholesterol, follow up with pre-visit labs, meter, and logs.  Glade Lloyd, MD Phone: [phone]  Fax: [phone]   05/09/2015, 2:12 PM

## 2015-06-06 LAB — HEMOGLOBIN A1C: HEMOGLOBIN A1C: 8.8

## 2015-06-11 IMAGING — CR DG CHEST 2V
2 series · 2 of 2 positions shown · non-contrast
Comparison: None

CLINICAL DATA: Preoperative respiratory exam for hemorrhoidectomy.

EXAM:
CHEST - 2 VIEW

[w chest pa]
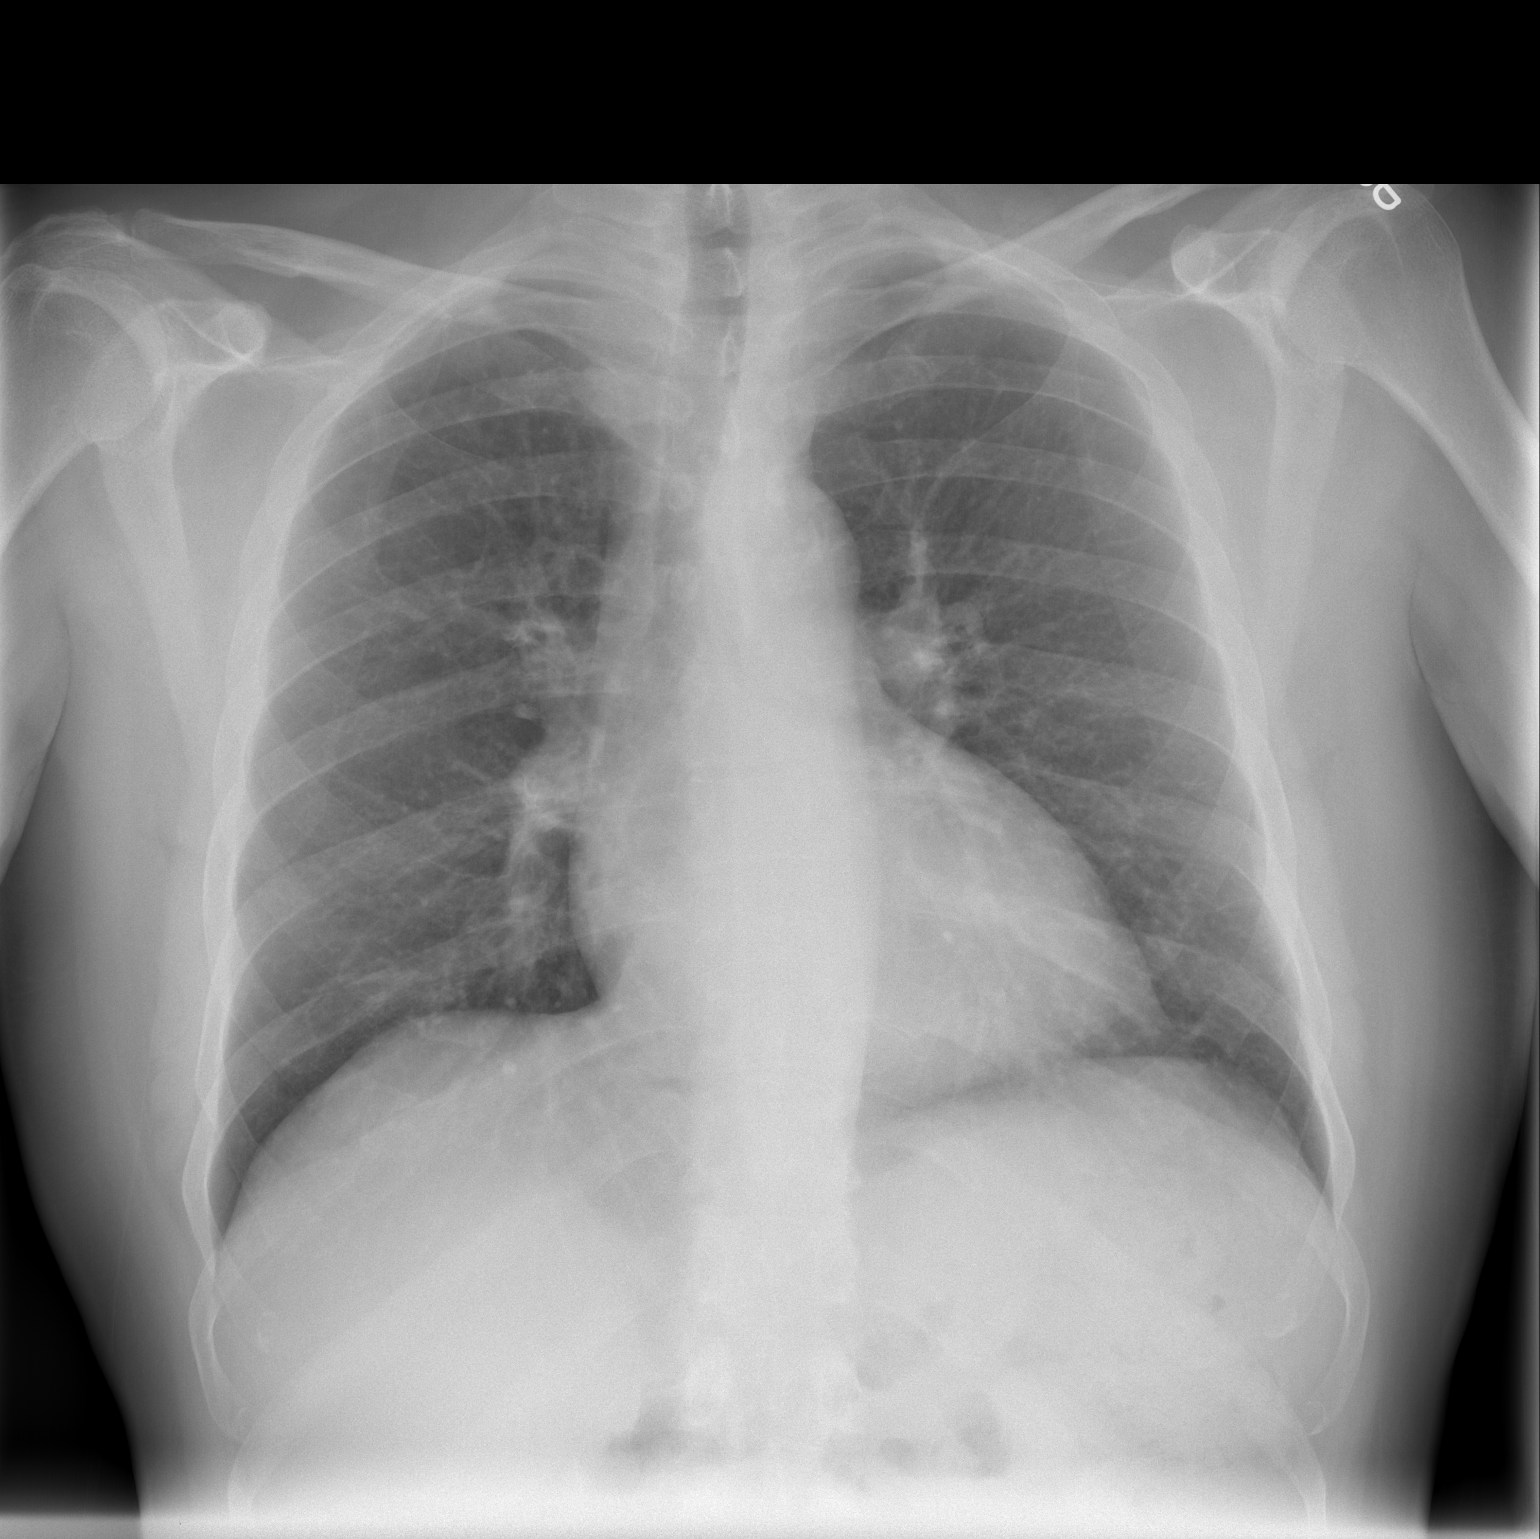

[w chest lat]
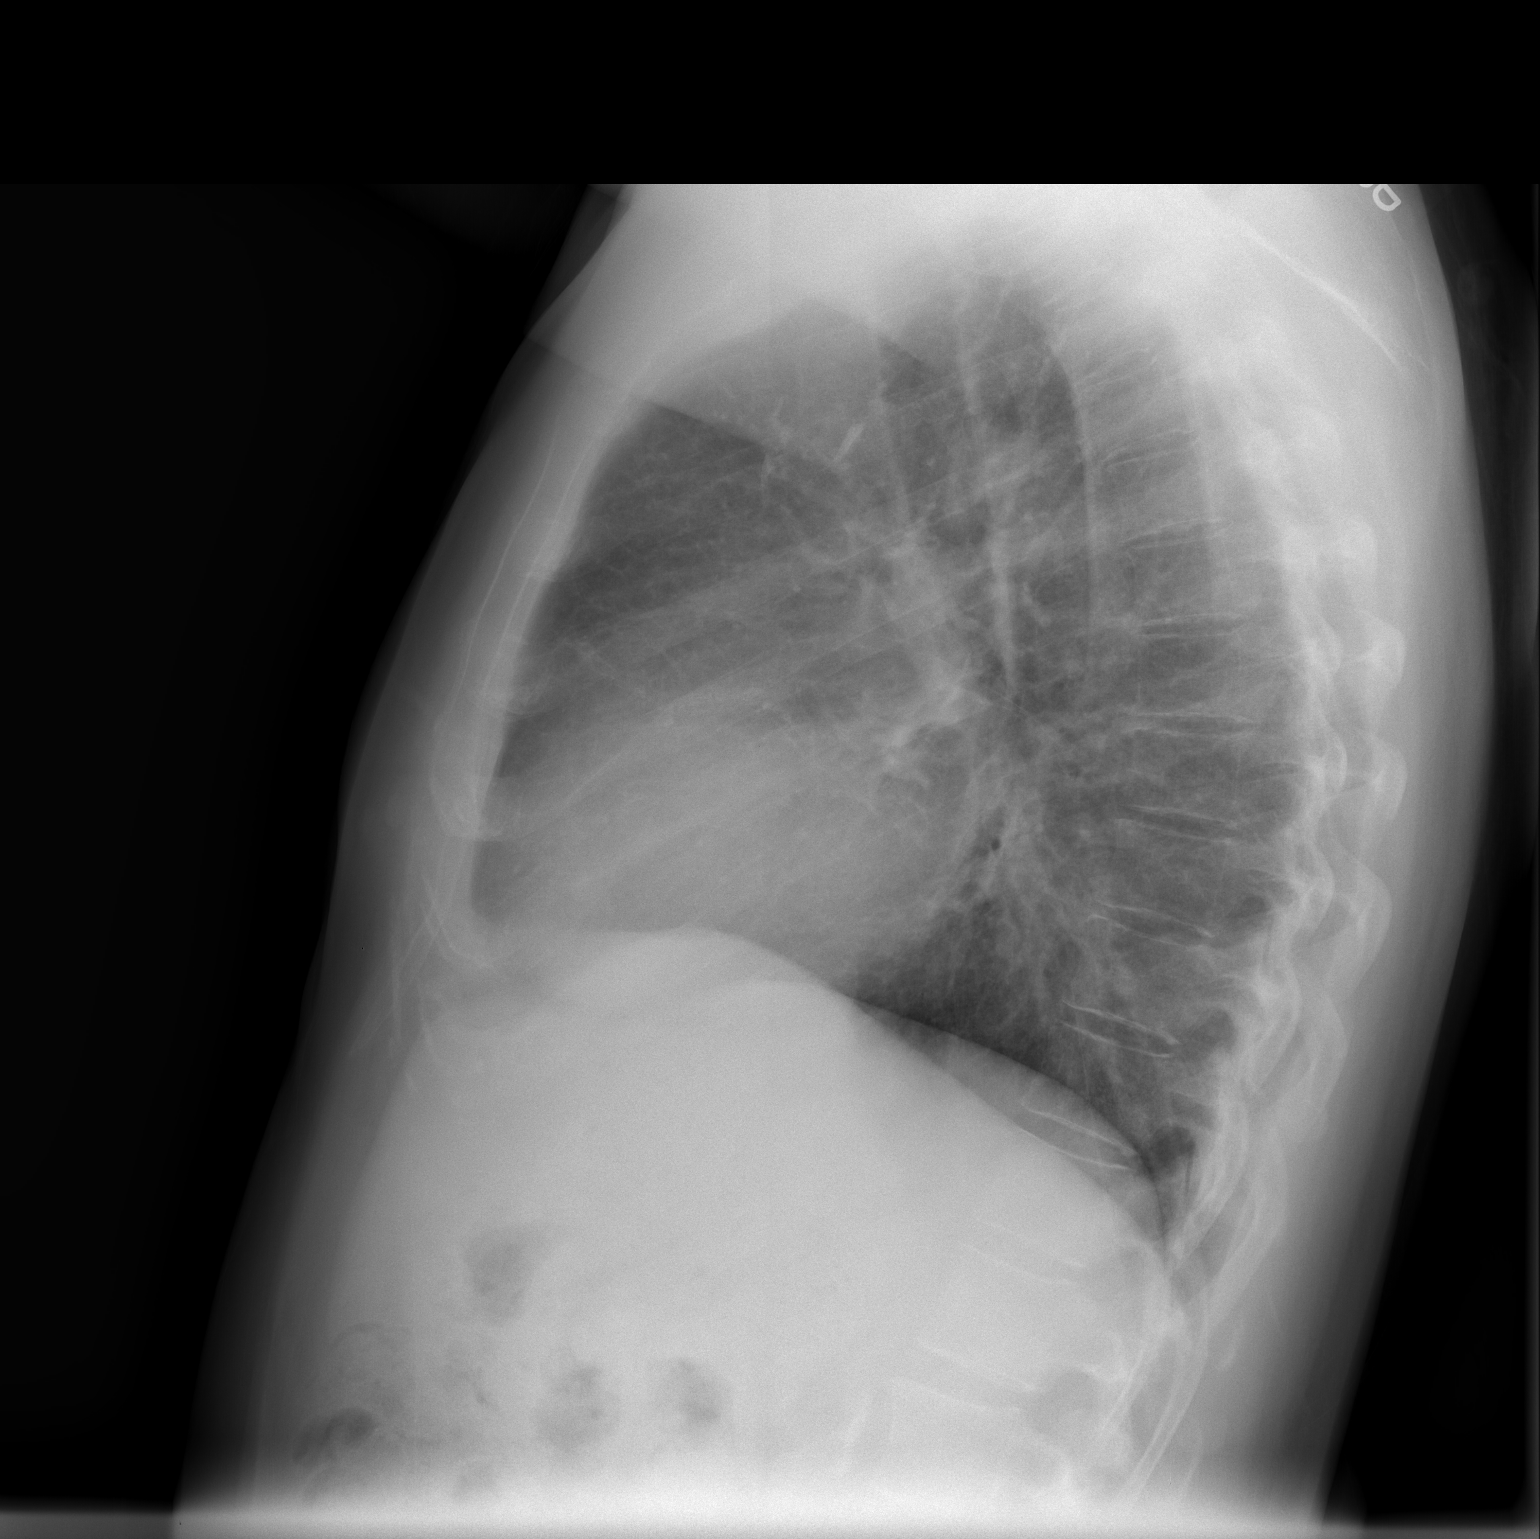

[2 of 2 positions shown; findings below may reference images not displayed]

FINDINGS: The heart size and mediastinal contours are within normal limits.
There is no evidence of pulmonary edema, consolidation,
pneumothorax, nodule or pleural fluid. The visualized skeletal
structures are unremarkable.
IMPRESSION: No active disease.

## 2015-08-08 ENCOUNTER — Encounter: Payer: Self-pay | Admitting: "Endocrinology

## 2015-08-08 ENCOUNTER — Ambulatory Visit (INDEPENDENT_AMBULATORY_CARE_PROVIDER_SITE_OTHER): Payer: BLUE CROSS/BLUE SHIELD | Admitting: "Endocrinology

## 2015-08-08 VITALS — BP 165/91 | HR 89 | Ht 69.0 in | Wt 174.0 lb

## 2015-08-08 DIAGNOSIS — IMO0002 Reserved for concepts with insufficient information to code with codable children: Secondary | ICD-10-CM

## 2015-08-08 DIAGNOSIS — E118 Type 2 diabetes mellitus with unspecified complications: Secondary | ICD-10-CM

## 2015-08-08 DIAGNOSIS — Z794 Long term (current) use of insulin: Secondary | ICD-10-CM

## 2015-08-08 DIAGNOSIS — E1165 Type 2 diabetes mellitus with hyperglycemia: Secondary | ICD-10-CM | POA: Diagnosis not present

## 2015-08-08 DIAGNOSIS — E785 Hyperlipidemia, unspecified: Secondary | ICD-10-CM

## 2015-08-08 DIAGNOSIS — I10 Essential (primary) hypertension: Secondary | ICD-10-CM

## 2015-08-08 MED ORDER — INSULIN GLARGINE 100 UNIT/ML SOLOSTAR PEN: 40.0000 [IU] | PEN_INJECTOR | Freq: Every day | SUBCUTANEOUS | Status: DC

## 2015-08-08 NOTE — Patient Instructions (Signed)

## 2015-08-08 NOTE — Progress Notes (Signed)
Subjective:    Patient ID: Randy [name], male    DOB: [birth year], PCP Sherrie Mustache, MD   Past Medical History  Diagnosis Date  . Hypertension   . Diabetes mellitus without complication ([place])   . Hyperlipidemia    Past Surgical History  Procedure Laterality Date  . No past surgeries    . Hemorrhoid surgery N/A 06/08/2013    Procedure: HEMORRHOIDECTOMY;  Surgeon: Leighton Ruff, MD;  Location: [facility];  Service: General;  Laterality: N/A;   Social History   Social History  . Marital Status: Single    Spouse Name: N/A  . Number of Children: N/A  . Years of Education: N/A   Social History Main Topics  . Smoking status: Former Smoker -- 15 years    Quit date: 06/19/2011  . Smokeless tobacco: Never Used  . Alcohol Use: Yes     Comment: rarely  . Drug Use: No  . Sexual Activity: Not on file   Other Topics Concern  . Not on file   Social History Narrative   Outpatient Encounter Prescriptions as of 08/08/2015  Medication Sig  . Insulin Glargine (LANTUS SOLOSTAR) 100 UNIT/ML Solostar Pen Inject 40 Units into the skin daily at 10 pm.  . metFORMIN (GLUCOPHAGE) 1000 MG tablet Take 1,000 mg by mouth 2 (two) times daily with a meal.  . [DISCONTINUED] Insulin Glargine (LANTUS SOLOSTAR) 100 UNIT/ML Solostar Pen Inject 30 Units into the skin daily at 10 pm.  . aspirin EC 81 MG tablet Take 81 mg by mouth daily.  Marland Kitchen atorvastatin (LIPITOR) 40 MG tablet Take 40 mg by mouth at bedtime.  . benazepril (LOTENSIN) 40 MG tablet Take 40 mg by mouth daily.  . bisacodyl (DULCOLAX) 5 MG EC tablet Take 1 tablet (5 mg total) by mouth every three (3) days as needed for moderate constipation. (Patient not taking: Reported on 05/09/2015)  . diazepam (VALIUM) 5 MG tablet Take 1 tablet (5 mg total) by mouth every 6 (six) hours as needed (inability to urinate, muscle spasms). (Patient not taking: Reported on 05/09/2015)  . docusate sodium 100 MG CAPS Take 100 mg by mouth 3  (three) times daily. (Patient not taking: Reported on 05/09/2015)  . FIBER SELECT GUMMIES PO Take 1 tablet by mouth daily.  . hydrochlorothiazide (MICROZIDE) 12.5 MG capsule Take 12.5 mg by mouth daily.  Marland Kitchen lidocaine (LMX) 4 % cream Apply topically 4 (four) times daily as needed (pain). (Patient not taking: Reported on 05/09/2015)  . losartan (COZAAR) 100 MG tablet Take 100 mg by mouth daily.  Marland Kitchen oxyCODONE (OXY IR/ROXICODONE) 5 MG immediate release tablet Take 1-2 tablets (5-10 mg total) by mouth every 4 (four) hours as needed for severe pain. (Patient not taking: Reported on 05/09/2015)  . polyethylene glycol (MIRALAX / GLYCOLAX) packet Take 17 g by mouth 2 (two) times daily. (Patient not taking: Reported on 05/09/2015)  . potassium chloride (K-DUR) 10 MEQ tablet Take 10 mEq by mouth 2 (two) times daily.  . psyllium (HYDROCIL/METAMUCIL) 95 % PACK Take 1 packet by mouth daily. (Patient not taking: Reported on 05/09/2015)  . sertraline (ZOLOFT) 100 MG tablet Take 100 mg by mouth daily.  . [DISCONTINUED] Canagliflozin 300 MG TABS Take 300 mg by mouth every morning.   . [DISCONTINUED] metFORMIN (GLUCOPHAGE) 1000 MG tablet Take 1,000 mg by mouth 2 (two) times daily with a meal.   No facility-administered encounter medications on file as of 08/08/2015.   ALLERGIES: No Known Allergies VACCINATION STATUS:  Immunization History  Administered Date(s) Administered  . Influenza,inj,Quad PF,36+ Mos 03/02/2014    Diabetes He presents for his follow-up diabetic visit. He has type 2 diabetes mellitus. Onset time: He was diagnosed at approximate age of 55 years. His disease course has been worsening. There are no hypoglycemic associated symptoms. Pertinent negatives for hypoglycemia include no confusion, headaches, pallor or seizures. There are no diabetic associated symptoms. Pertinent negatives for diabetes include no chest pain, no fatigue, no polydipsia, no polyphagia, no polyuria and no weakness. There are  no hypoglycemic complications. Symptoms are worsening. There are no diabetic complications. Risk factors for coronary artery disease include dyslipidemia, diabetes mellitus, hypertension, sedentary lifestyle and male sex. Current diabetic treatment includes insulin injections and oral agent (monotherapy). He is compliant with treatment most of the time. He is following a generally unhealthy diet. He participates in exercise intermittently. Home blood sugar record trend: He is A1c from December 2016 was higher at 8.8% from prior A1c of 8.2%. His breakfast blood glucose range is generally 140-180 mg/dl. An ACE inhibitor/angiotensin II receptor blocker is being taken.  Hyperlipidemia This is a chronic problem. The current episode started more than 1 year ago. Pertinent negatives include no chest pain, myalgias or shortness of breath. Current antihyperlipidemic treatment includes statins. Risk factors for coronary artery disease include diabetes mellitus, dyslipidemia, hypertension and male sex.  Hypertension This is a chronic problem. The current episode started more than 1 year ago. Pertinent negatives include no chest pain, headaches, neck pain, palpitations or shortness of breath. Past treatments include ACE inhibitors.     Review of Systems  Constitutional: Negative for fever, chills, fatigue and unexpected weight change.  HENT: Negative for dental problem, mouth sores and trouble swallowing.   Eyes: Negative for visual disturbance.  Respiratory: Negative for cough, choking, chest tightness, shortness of breath and wheezing.   Cardiovascular: Negative for chest pain, palpitations and leg swelling.  Gastrointestinal: Negative for nausea, vomiting, abdominal pain, diarrhea, constipation and abdominal distention.  Endocrine: Negative for polydipsia, polyphagia and polyuria.  Genitourinary: Negative for dysuria, urgency, hematuria and flank pain.  Musculoskeletal: Negative for myalgias, back pain, gait  problem and neck pain.  Skin: Negative for pallor, rash and wound.  Neurological: Negative for seizures, syncope, weakness, numbness and headaches.  Psychiatric/Behavioral: Negative.  Negative for confusion and dysphoric mood.    Objective:    BP 165/91 mmHg  Pulse 89  Ht 5\' 9"  (1.753 m)  Wt 174 lb (78.926 kg)  BMI 25.68 kg/m2  SpO2 96%  Wt Readings from Last 3 Encounters:  08/08/15 174 lb (78.926 kg)  05/09/15 172 lb 6 oz (78.189 kg)  08/08/13 171 lb (77.565 kg)    Physical Exam  Constitutional: He is oriented to person, place, and time. He appears well-developed and well-nourished. He is cooperative. No distress.  HENT:  Head: Normocephalic and atraumatic.  Eyes: EOM are normal.  Neck: Normal range of motion. Neck supple. No tracheal deviation present. No thyromegaly present.  Cardiovascular: Normal rate, S1 normal, S2 normal and normal heart sounds.  Exam reveals no gallop.   No murmur heard. Pulses:      Dorsalis pedis pulses are 1+ on the right side, and 1+ on the left side.       Posterior tibial pulses are 1+ on the right side, and 1+ on the left side.  Pulmonary/Chest: Breath sounds normal. No respiratory distress. He has no wheezes.  Abdominal: Soft. Bowel sounds are normal. He exhibits no distension. There is  no tenderness. There is no guarding and no CVA tenderness.  Musculoskeletal: He exhibits no edema.       Right shoulder: He exhibits no swelling and no deformity.  Neurological: He is alert and oriented to person, place, and time. He has normal strength and normal reflexes. No cranial nerve deficit or sensory deficit. Gait normal.  Skin: Skin is warm and dry. No rash noted. No cyanosis. Nails show no clubbing.  Psychiatric: He has a normal mood and affect. His speech is normal and behavior is normal. Judgment and thought content normal. Cognition and memory are normal.    Results for orders placed or performed in visit on 05/09/15  Hemoglobin A1c  Result Value  Ref Range   Hgb A1c MFr Bld 8.2 (A) 4.0 - 6.0 %  HM DIABETES EYE EXAM  Result Value Ref Range   HM Diabetic Eye Exam No Retinopathy No Retinopathy   Complete Blood Count (Most recent): Lab Results  Component Value Date   WBC 14.8* 06/08/2013   HGB 16.1 06/08/2013   HCT 44.3 06/08/2013   MCV 96.3 06/08/2013   PLT 187 06/08/2013   Chemistry (most recent): Lab Results  Component Value Date   NA 137 06/08/2013   K 3.5* 06/08/2013   CL 95* 06/08/2013   CO2 27 06/08/2013   BUN 19 06/08/2013   CREATININE 1.02 06/08/2013    Assessment & Plan:   1. Uncontrolled type 2 diabetes mellitus with complication, with long-term current use of insulin ([place])   - patient remains at a high risk for more acute and chronic complications of diabetes which include CAD, CVA, CKD, retinopathy, and neuropathy. These are all discussed in detail with the patient.  Patient came with controlled fasting glucose profile, and  recent A1c of 8.8% higher than his last visit when it was 8.2% , even though it is  generally improving from 10.1 %.  Glucose logs and insulin administration records pertaining to this visit,  to be scanned into patient's records.  Recent labs reviewed.   - I have re-counseled the patient on diet management   by adopting a carbohydrate restricted / protein rich  Diet.  - Suggestion is made for patient to avoid simple carbohydrates   from their diet including Cakes , Desserts, Ice Cream,  Soda (  diet and regular) , Sweet Tea , Candies,  Chips, Cookies, Artificial Sweeteners,   and "Sugar-free" Products .  This will help patient to have stable blood glucose profile and potentially avoid unintended  Weight gain.  - Patient is advised to stick to a routine mealtimes to eat 3 meals  a day and avoid unnecessary snacks ( to snack only to correct hypoglycemia).   - I have approached patient with the following individualized plan to manage diabetes and patient agrees.  - I will increase  Lantus  to 40 units qhs , associated with monitoring of BG before meals and at bedtime . -He is asked to return in 2 weeks with meter and logs and new labs including A1c.   I will continue Metformin 1gm po BID.  - Patient specific target  for A1c; LDL, HDL, Triglycerides, and  Waist Circumference were discussed in detail.  2) BP/HTN: Uncontrolled, he took his medications late today. Continue current medications including ACEI/ARB. 3) Lipids/HPL:  continue statins. 4)  Weight/Diet: CDE consult in progress, exercise, and carbohydrates information provided.  5) Chronic Care/Health Maintenance:  -Patient is on ACEI/ARB and Statin medications and encouraged to continue to  follow up with Ophthalmology, Podiatrist at least yearly or according to recommendations, and advised to  stay away from smoking. I have recommended yearly flu vaccine and pneumonia vaccination at least every 5 years; moderate intensity exercise for up to 150 minutes weekly; and  sleep for at least 7 hours a day.  - 25 minutes of time was spent on the care of this patient , 50% of which was applied for counseling on diabetes complications and their preventions.  - I advised patient to maintain close follow up with Sherrie Mustache, MD for primary care needs.  Patient is asked to bring meter and  blood glucose logs during their next visit.   Follow up plan: -Return in about 2 weeks (around 08/22/2015) for diabetes, high blood pressure, high cholesterol, follow up with pre-visit labs, meter, and logs.  Glade Lloyd, MD Phone: [phone]  Fax: [phone]   08/08/2015, 3:36 PM

## 2015-08-22 ENCOUNTER — Ambulatory Visit (INDEPENDENT_AMBULATORY_CARE_PROVIDER_SITE_OTHER): Payer: BLUE CROSS/BLUE SHIELD | Admitting: "Endocrinology

## 2015-08-22 ENCOUNTER — Encounter: Payer: Self-pay | Admitting: "Endocrinology

## 2015-08-22 VITALS — BP 151/84 | HR 86 | Ht 69.0 in | Wt 175.0 lb

## 2015-08-22 DIAGNOSIS — IMO0002 Reserved for concepts with insufficient information to code with codable children: Secondary | ICD-10-CM

## 2015-08-22 DIAGNOSIS — E118 Type 2 diabetes mellitus with unspecified complications: Secondary | ICD-10-CM

## 2015-08-22 DIAGNOSIS — E1165 Type 2 diabetes mellitus with hyperglycemia: Secondary | ICD-10-CM | POA: Diagnosis not present

## 2015-08-22 DIAGNOSIS — E785 Hyperlipidemia, unspecified: Secondary | ICD-10-CM

## 2015-08-22 DIAGNOSIS — I10 Essential (primary) hypertension: Secondary | ICD-10-CM

## 2015-08-22 DIAGNOSIS — Z794 Long term (current) use of insulin: Secondary | ICD-10-CM

## 2015-08-22 NOTE — Progress Notes (Signed)
Subjective:    Patient ID: Randy [name], male    DOB: 02/01/1951, PCP Sherrie Mustache, MD   Past Medical History  Diagnosis Date  . Hypertension   . Diabetes mellitus without complication ([place])   . Hyperlipidemia    Past Surgical History  Procedure Laterality Date  . No past surgeries    . Hemorrhoid surgery N/A 06/08/2013    Procedure: HEMORRHOIDECTOMY;  Surgeon: Leighton Ruff, MD;  Location: [facility];  Service: General;  Laterality: N/A;   Social History   Social History  . Marital Status: Single    Spouse Name: N/A  . Number of Children: N/A  . Years of Education: N/A   Social History Main Topics  . Smoking status: Former Smoker -- 15 years    Quit date: 06/19/2011  . Smokeless tobacco: Never Used  . Alcohol Use: Yes     Comment: rarely  . Drug Use: No  . Sexual Activity: Not Asked   Other Topics Concern  . None   Social History Narrative   Outpatient Encounter Prescriptions as of 08/22/2015  Medication Sig  . aspirin EC 81 MG tablet Take 81 mg by mouth daily.  Marland Kitchen atorvastatin (LIPITOR) 40 MG tablet Take 40 mg by mouth at bedtime.  . benazepril (LOTENSIN) 40 MG tablet Take 40 mg by mouth daily.  . bisacodyl (DULCOLAX) 5 MG EC tablet Take 1 tablet (5 mg total) by mouth every three (3) days as needed for moderate constipation. (Patient not taking: Reported on 05/09/2015)  . diazepam (VALIUM) 5 MG tablet Take 1 tablet (5 mg total) by mouth every 6 (six) hours as needed (inability to urinate, muscle spasms). (Patient not taking: Reported on 05/09/2015)  . docusate sodium 100 MG CAPS Take 100 mg by mouth 3 (three) times daily. (Patient not taking: Reported on 05/09/2015)  . FIBER SELECT GUMMIES PO Take 1 tablet by mouth daily.  . hydrochlorothiazide (MICROZIDE) 12.5 MG capsule Take 12.5 mg by mouth daily.  . Insulin Glargine (LANTUS SOLOSTAR) 100 UNIT/ML Solostar Pen Inject 40 Units into the skin daily at 10 pm.  . lidocaine (LMX) 4 %  cream Apply topically 4 (four) times daily as needed (pain). (Patient not taking: Reported on 05/09/2015)  . losartan (COZAAR) 100 MG tablet Take 100 mg by mouth daily.  . metFORMIN (GLUCOPHAGE) 1000 MG tablet Take 1,000 mg by mouth 2 (two) times daily with a meal.  . oxyCODONE (OXY IR/ROXICODONE) 5 MG immediate release tablet Take 1-2 tablets (5-10 mg total) by mouth every 4 (four) hours as needed for severe pain. (Patient not taking: Reported on 05/09/2015)  . polyethylene glycol (MIRALAX / GLYCOLAX) packet Take 17 g by mouth 2 (two) times daily. (Patient not taking: Reported on 05/09/2015)  . potassium chloride (K-DUR) 10 MEQ tablet Take 10 mEq by mouth 2 (two) times daily.  . psyllium (HYDROCIL/METAMUCIL) 95 % PACK Take 1 packet by mouth daily. (Patient not taking: Reported on 05/09/2015)  . sertraline (ZOLOFT) 100 MG tablet Take 100 mg by mouth daily.   No facility-administered encounter medications on file as of 08/22/2015.   ALLERGIES: No Known Allergies VACCINATION STATUS: Immunization History  Administered Date(s) Administered  . Influenza,inj,Quad PF,36+ Mos 03/02/2014    Diabetes He presents for his follow-up diabetic visit. He has type 2 diabetes mellitus. Onset time: He was diagnosed at approximate age of 79 years. His disease course has been improving. There are no hypoglycemic associated symptoms. Pertinent negatives for hypoglycemia include no confusion,  headaches, pallor or seizures. There are no diabetic associated symptoms. Pertinent negatives for diabetes include no chest pain, no fatigue, no polydipsia, no polyphagia, no polyuria and no weakness. There are no hypoglycemic complications. Symptoms are improving. There are no diabetic complications. Risk factors for coronary artery disease include dyslipidemia, diabetes mellitus, hypertension, sedentary lifestyle and male sex. Current diabetic treatment includes insulin injections and oral agent (monotherapy). He is compliant  with treatment most of the time. His weight is stable. He is following a generally unhealthy diet. He participates in exercise intermittently. Home blood sugar record trend: He is A1c from December 2016 was higher at 8.8% from prior A1c of 8.2%. His breakfast blood glucose range is generally 140-180 mg/dl. His lunch blood glucose range is generally 140-180 mg/dl. His dinner blood glucose range is generally 140-180 mg/dl. His overall blood glucose range is 140-180 mg/dl. An ACE inhibitor/angiotensin II receptor blocker is being taken.  Hyperlipidemia This is a chronic problem. The current episode started more than 1 year ago. Pertinent negatives include no chest pain, myalgias or shortness of breath. Current antihyperlipidemic treatment includes statins. Risk factors for coronary artery disease include diabetes mellitus, dyslipidemia, hypertension and male sex.  Hypertension This is a chronic problem. The current episode started more than 1 year ago. Pertinent negatives include no chest pain, headaches, neck pain, palpitations or shortness of breath. Past treatments include ACE inhibitors.     Review of Systems  Constitutional: Negative for fever, chills, fatigue and unexpected weight change.  HENT: Negative for dental problem, mouth sores and trouble swallowing.   Eyes: Negative for visual disturbance.  Respiratory: Negative for cough, choking, chest tightness, shortness of breath and wheezing.   Cardiovascular: Negative for chest pain, palpitations and leg swelling.  Gastrointestinal: Negative for nausea, vomiting, abdominal pain, diarrhea, constipation and abdominal distention.  Endocrine: Negative for polydipsia, polyphagia and polyuria.  Genitourinary: Negative for dysuria, urgency, hematuria and flank pain.  Musculoskeletal: Negative for myalgias, back pain, gait problem and neck pain.  Skin: Negative for pallor, rash and wound.  Neurological: Negative for seizures, syncope, weakness,  numbness and headaches.  Psychiatric/Behavioral: Negative.  Negative for confusion and dysphoric mood.    Objective:    BP 151/84 mmHg  Pulse 86  Ht 5\' 9"  (1.753 m)  Wt 175 lb (79.379 kg)  BMI 25.83 kg/m2  SpO2 99%  Wt Readings from Last 3 Encounters:  08/22/15 175 lb (79.379 kg)  08/08/15 174 lb (78.926 kg)  05/09/15 172 lb 6 oz (78.189 kg)    Physical Exam  Constitutional: He is oriented to person, place, and time. He appears well-developed and well-nourished. He is cooperative. No distress.  HENT:  Head: Normocephalic and atraumatic.  Eyes: EOM are normal.  Neck: Normal range of motion. Neck supple. No tracheal deviation present. No thyromegaly present.  Cardiovascular: Normal rate, S1 normal, S2 normal and normal heart sounds.  Exam reveals no gallop.   No murmur heard. Pulses:      Dorsalis pedis pulses are 1+ on the right side, and 1+ on the left side.       Posterior tibial pulses are 1+ on the right side, and 1+ on the left side.  Pulmonary/Chest: Breath sounds normal. No respiratory distress. He has no wheezes.  Abdominal: Soft. Bowel sounds are normal. He exhibits no distension. There is no tenderness. There is no guarding and no CVA tenderness.  Musculoskeletal: He exhibits no edema.       Right shoulder: He exhibits no swelling and  no deformity.  Neurological: He is alert and oriented to person, place, and time. He has normal strength and normal reflexes. No cranial nerve deficit or sensory deficit. Gait normal.  Skin: Skin is warm and dry. No rash noted. No cyanosis. Nails show no clubbing.  Psychiatric: He has a normal mood and affect. His speech is normal and behavior is normal. Judgment and thought content normal. Cognition and memory are normal.    Results for orders placed or performed in visit on 08/08/15  Hemoglobin A1c  Result Value Ref Range   Hemoglobin A1C 8.8    Complete Blood Count (Most recent): Lab Results  Component Value Date   WBC 14.8*  06/08/2013   HGB 16.1 06/08/2013   HCT 44.3 06/08/2013   MCV 96.3 06/08/2013   PLT 187 06/08/2013   Chemistry (most recent): Lab Results  Component Value Date   NA 137 06/08/2013   K 3.5* 06/08/2013   CL 95* 06/08/2013   CO2 27 06/08/2013   BUN 19 06/08/2013   CREATININE 1.02 06/08/2013    Assessment & Plan:   1. Uncontrolled type 2 diabetes mellitus with complication, with long-term current use of insulin ([place])   - patient remains at a high risk for more acute and chronic complications of diabetes which include CAD, CVA, CKD, retinopathy, and neuropathy. These are all discussed in detail with the patient.  Patient came with controlled fasting glucose profile, and  recent A1c of 8.8% higher than his last visit when it was 8.2% , even though it is  generally improving from 10.1 %.  Glucose logs and insulin administration records pertaining to this visit,  to be scanned into patient's records.  Recent labs reviewed.   - I have re-counseled the patient on diet management   by adopting a carbohydrate restricted / protein rich  Diet.  - Suggestion is made for patient to avoid simple carbohydrates   from their diet including Cakes , Desserts, Ice Cream,  Soda (  diet and regular) , Sweet Tea , Candies,  Chips, Cookies, Artificial Sweeteners,   and "Sugar-free" Products .  This will help patient to have stable blood glucose profile and potentially avoid unintended  Weight gain.  - Patient is advised to stick to a routine mealtimes to eat 3 meals  a day and avoid unnecessary snacks ( to snack only to correct hypoglycemia).   - I have approached patient with the following individualized plan to manage diabetes and patient agrees.  - I will continue  Lantus  40 units qhs , associated with monitoring of BG before breakfast. -he promises to do better on his diet. -if his a1c remains high next visit, he will be considered for prandial insulin or premixed insulin twice a day.  I will  continue Metformin 1gm po BID.  - Patient specific target  for A1c; LDL, HDL, Triglycerides, and  Waist Circumference were discussed in detail.  2) BP/HTN: Uncontrolled, he took his medications late today. Continue current medications including ACEI/ARB. 3) Lipids/HPL:  continue statins. 4)  Weight/Diet: CDE consult in progress, exercise, and carbohydrates information provided.  5) Chronic Care/Health Maintenance:  -Patient is on ACEI/ARB and Statin medications and encouraged to continue to follow up with Ophthalmology, Podiatrist at least yearly or according to recommendations, and advised to  stay away from smoking. I have recommended yearly flu vaccine and pneumonia vaccination at least every 5 years; moderate intensity exercise for up to 150 minutes weekly; and  sleep for at least  7 hours a day.  - 25 minutes of time was spent on the care of this patient , 50% of which was applied for counseling on diabetes complications and their preventions.  - I advised patient to maintain close follow up with Sherrie Mustache, MD for primary care needs.  Patient is asked to bring meter and  blood glucose logs during their next visit.   Follow up plan: -Return in about 3 months (around 11/22/2015) for diabetes, high blood pressure, high cholesterol, follow up with pre-visit labs, meter, and logs.  Glade Lloyd, MD Phone: [phone]  Fax: [phone]   08/22/2015, 1:12 PM

## 2015-10-17 LAB — HEMOGLOBIN A1C: Hemoglobin A1C: 8.3

## 2015-11-28 ENCOUNTER — Ambulatory Visit (INDEPENDENT_AMBULATORY_CARE_PROVIDER_SITE_OTHER): Payer: BLUE CROSS/BLUE SHIELD | Admitting: "Endocrinology

## 2015-11-28 ENCOUNTER — Encounter: Payer: Self-pay | Admitting: "Endocrinology

## 2015-11-28 VITALS — BP 152/80 | HR 76 | Ht 69.0 in | Wt 177.0 lb

## 2015-11-28 DIAGNOSIS — Z794 Long term (current) use of insulin: Secondary | ICD-10-CM

## 2015-11-28 DIAGNOSIS — E1165 Type 2 diabetes mellitus with hyperglycemia: Secondary | ICD-10-CM

## 2015-11-28 DIAGNOSIS — E785 Hyperlipidemia, unspecified: Secondary | ICD-10-CM

## 2015-11-28 DIAGNOSIS — IMO0002 Reserved for concepts with insufficient information to code with codable children: Secondary | ICD-10-CM

## 2015-11-28 DIAGNOSIS — I10 Essential (primary) hypertension: Secondary | ICD-10-CM

## 2015-11-28 DIAGNOSIS — E118 Type 2 diabetes mellitus with unspecified complications: Secondary | ICD-10-CM | POA: Diagnosis not present

## 2015-11-28 NOTE — Patient Instructions (Signed)

## 2015-11-28 NOTE — Progress Notes (Signed)
Subjective:    Patient ID: Randy [name], male    DOB: Aug 06, 1950, PCP Sherrie Mustache, MD   Past Medical History  Diagnosis Date  . Hypertension   . Diabetes mellitus without complication ([place])   . Hyperlipidemia    Past Surgical History  Procedure Laterality Date  . No past surgeries    . Hemorrhoid surgery N/A 06/08/2013    Procedure: HEMORRHOIDECTOMY;  Surgeon: Leighton Ruff, MD;  Location: [facility];  Service: General;  Laterality: N/A;   Social History   Social History  . Marital Status: Single    Spouse Name: N/A  . Number of Children: N/A  . Years of Education: N/A   Social History Main Topics  . Smoking status: Former Smoker -- 15 years    Quit date: 06/19/2011  . Smokeless tobacco: Never Used  . Alcohol Use: Yes     Comment: rarely  . Drug Use: No  . Sexual Activity: Not Asked   Other Topics Concern  . None   Social History Narrative   Outpatient Encounter Prescriptions as of 11/28/2015  Medication Sig  . aspirin EC 81 MG tablet Take 81 mg by mouth daily.  Marland Kitchen atorvastatin (LIPITOR) 40 MG tablet Take 40 mg by mouth at bedtime.  . benazepril (LOTENSIN) 40 MG tablet Take 40 mg by mouth daily.  Marland Kitchen FIBER SELECT GUMMIES PO Take 1 tablet by mouth daily.  . hydrochlorothiazide (MICROZIDE) 12.5 MG capsule Take 12.5 mg by mouth daily.  . Insulin Glargine (LANTUS SOLOSTAR) 100 UNIT/ML Solostar Pen Inject 40 Units into the skin daily at 10 pm.  . losartan (COZAAR) 100 MG tablet Take 100 mg by mouth daily.  . metFORMIN (GLUCOPHAGE) 1000 MG tablet Take 1,000 mg by mouth 2 (two) times daily with a meal.  . potassium chloride (K-DUR) 10 MEQ tablet Take 10 mEq by mouth 2 (two) times daily.  . sertraline (ZOLOFT) 100 MG tablet Take 100 mg by mouth daily.  . [DISCONTINUED] bisacodyl (DULCOLAX) 5 MG EC tablet Take 1 tablet (5 mg total) by mouth every three (3) days as needed for moderate constipation. (Patient not taking: Reported on 05/09/2015)  .  [DISCONTINUED] diazepam (VALIUM) 5 MG tablet Take 1 tablet (5 mg total) by mouth every 6 (six) hours as needed (inability to urinate, muscle spasms). (Patient not taking: Reported on 05/09/2015)  . [DISCONTINUED] docusate sodium 100 MG CAPS Take 100 mg by mouth 3 (three) times daily. (Patient not taking: Reported on 05/09/2015)  . [DISCONTINUED] lidocaine (LMX) 4 % cream Apply topically 4 (four) times daily as needed (pain). (Patient not taking: Reported on 05/09/2015)  . [DISCONTINUED] oxyCODONE (OXY IR/ROXICODONE) 5 MG immediate release tablet Take 1-2 tablets (5-10 mg total) by mouth every 4 (four) hours as needed for severe pain. (Patient not taking: Reported on 05/09/2015)  . [DISCONTINUED] polyethylene glycol (MIRALAX / GLYCOLAX) packet Take 17 g by mouth 2 (two) times daily. (Patient not taking: Reported on 05/09/2015)  . [DISCONTINUED] psyllium (HYDROCIL/METAMUCIL) 95 % PACK Take 1 packet by mouth daily. (Patient not taking: Reported on 05/09/2015)   No facility-administered encounter medications on file as of 11/28/2015.   ALLERGIES: No Known Allergies VACCINATION STATUS: Immunization History  Administered Date(s) Administered  . Influenza,inj,Quad PF,36+ Mos 03/02/2014    Diabetes He presents for his follow-up diabetic visit. He has type 2 diabetes mellitus. Onset time: He was diagnosed at approximate age of 59 years. His disease course has been improving. There are no hypoglycemic associated symptoms.  Pertinent negatives for hypoglycemia include no confusion, headaches, pallor or seizures. There are no diabetic associated symptoms. Pertinent negatives for diabetes include no chest pain, no fatigue, no polydipsia, no polyphagia, no polyuria and no weakness. There are no hypoglycemic complications. Symptoms are improving. There are no diabetic complications. Risk factors for coronary artery disease include dyslipidemia, diabetes mellitus, hypertension, sedentary lifestyle and male sex.  Current diabetic treatment includes insulin injections and oral agent (monotherapy). He is compliant with treatment most of the time. His weight is stable. He is following a generally unhealthy diet. He participates in exercise intermittently. Home blood sugar record trend: He is A1c from December 2016 was higher at 8.8% from prior A1c of 8.2%. His dinner blood glucose range is generally 140-180 mg/dl. His overall blood glucose range is 140-180 mg/dl. An ACE inhibitor/angiotensin II receptor blocker is being taken.  Hyperlipidemia This is a chronic problem. The current episode started more than 1 year ago. Pertinent negatives include no chest pain, myalgias or shortness of breath. Current antihyperlipidemic treatment includes statins. Risk factors for coronary artery disease include diabetes mellitus, dyslipidemia, hypertension and male sex.  Hypertension This is a chronic problem. The current episode started more than 1 year ago. Pertinent negatives include no chest pain, headaches, neck pain, palpitations or shortness of breath. Past treatments include ACE inhibitors.     Review of Systems  Constitutional: Negative for fever, chills, fatigue and unexpected weight change.  HENT: Negative for dental problem, mouth sores and trouble swallowing.   Eyes: Negative for visual disturbance.  Respiratory: Negative for cough, choking, chest tightness, shortness of breath and wheezing.   Cardiovascular: Negative for chest pain, palpitations and leg swelling.  Gastrointestinal: Negative for nausea, vomiting, abdominal pain, diarrhea, constipation and abdominal distention.  Endocrine: Negative for polydipsia, polyphagia and polyuria.  Genitourinary: Negative for dysuria, urgency, hematuria and flank pain.  Musculoskeletal: Negative for myalgias, back pain, gait problem and neck pain.  Skin: Negative for pallor, rash and wound.  Neurological: Negative for seizures, syncope, weakness, numbness and headaches.   Psychiatric/Behavioral: Negative.  Negative for confusion and dysphoric mood.    Objective:    BP 152/80 mmHg  Pulse 76  Ht 5\' 9"  (1.753 m)  Wt 177 lb (80.287 kg)  BMI 26.13 kg/m2  Wt Readings from Last 3 Encounters:  11/28/15 177 lb (80.287 kg)  08/22/15 175 lb (79.379 kg)  08/08/15 174 lb (78.926 kg)    Physical Exam  Constitutional: He is oriented to person, place, and time. He appears well-developed and well-nourished. He is cooperative. No distress.  HENT:  Head: Normocephalic and atraumatic.  Eyes: EOM are normal.  Neck: Normal range of motion. Neck supple. No tracheal deviation present. No thyromegaly present.  Cardiovascular: Normal rate, S1 normal, S2 normal and normal heart sounds.  Exam reveals no gallop.   No murmur heard. Pulses:      Dorsalis pedis pulses are 1+ on the right side, and 1+ on the left side.       Posterior tibial pulses are 1+ on the right side, and 1+ on the left side.  Pulmonary/Chest: Breath sounds normal. No respiratory distress. He has no wheezes.  Abdominal: Soft. Bowel sounds are normal. He exhibits no distension. There is no tenderness. There is no guarding and no CVA tenderness.  Musculoskeletal: He exhibits no edema.       Right shoulder: He exhibits no swelling and no deformity.  Neurological: He is alert and oriented to person, place, and time. He has  normal strength and normal reflexes. No cranial nerve deficit or sensory deficit. Gait normal.  Skin: Skin is warm and dry. No rash noted. No cyanosis. Nails show no clubbing.  Psychiatric: He has a normal mood and affect. His speech is normal and behavior is normal. Judgment and thought content normal. Cognition and memory are normal.    Results for orders placed or performed in visit on 11/28/15  Hemoglobin A1c  Result Value Ref Range   Hemoglobin A1C 8.3    Complete Blood Count (Most recent): Lab Results  Component Value Date   WBC 14.8* 06/08/2013   HGB 16.1 06/08/2013   HCT  44.3 06/08/2013   MCV 96.3 06/08/2013   PLT 187 06/08/2013   Chemistry (most recent): Lab Results  Component Value Date   NA 137 06/08/2013   K 3.5* 06/08/2013   CL 95* 06/08/2013   CO2 27 06/08/2013   BUN 19 06/08/2013   CREATININE 1.02 06/08/2013    Assessment & Plan:   1. Uncontrolled type 2 diabetes mellitus with complication, with long-term current use of insulin ([place])   - patient remains at a high risk for more acute and chronic complications of diabetes which include CAD, CVA, CKD, retinopathy, and neuropathy. These are all discussed in detail with the patient.  Patient came with controlled fasting glucose profile, and  recent A1c of 8.3% improving from 8.8% .   generally improving from 10.1 %.  Glucose logs and insulin administration records pertaining to this visit,  to be scanned into patient's records.  Recent labs reviewed.   - I have re-counseled the patient on diet management   by adopting a carbohydrate restricted / protein rich  Diet.  - Suggestion is made for patient to avoid simple carbohydrates   from their diet including Cakes , Desserts, Ice Cream,  Soda (  diet and regular) , Sweet Tea , Candies,  Chips, Cookies, Artificial Sweeteners,   and "Sugar-free" Products .  This will help patient to have stable blood glucose profile and potentially avoid unintended  Weight gain.  - Patient is advised to stick to a routine mealtimes to eat 3 meals  a day and avoid unnecessary snacks ( to snack only to correct hypoglycemia).   - I have approached patient with the following individualized plan to manage diabetes and patient agrees.  - I will increase Lantus to 50 units qhs , associated with monitoring of BG before breakfast. -he promises to do better on his diet. -if his a1c remains high next visit, he will be considered for prandial insulin or premixed insulin twice a day.  I will continue Metformin 1gm po BID.  - Patient specific target  for A1c; LDL, HDL,  Triglycerides, and  Waist Circumference were discussed in detail.  2) BP/HTN: Uncontrolled, he took his medications late today. Continue current medications including ACEI/ARB. 3) Lipids/HPL:  continue statins. 4)  Weight/Diet: CDE consult in progress, exercise, and carbohydrates information provided.  5) Chronic Care/Health Maintenance:  -Patient is on ACEI/ARB and Statin medications and encouraged to continue to follow up with Ophthalmology, Podiatrist at least yearly or according to recommendations, and advised to  stay away from smoking. I have recommended yearly flu vaccine and pneumonia vaccination at least every 5 years; moderate intensity exercise for up to 150 minutes weekly; and  sleep for at least 7 hours a day.  - 25 minutes of time was spent on the care of this patient , 50% of which was applied for counseling  on diabetes complications and their preventions.  - I advised patient to maintain close follow up with Sherrie Mustache, MD for primary care needs.  Patient is asked to bring meter and  blood glucose logs during their next visit.   Follow up plan: -Return in about 3 months (around 02/28/2016) for follow up with pre-visit labs, meter, and logs.  Glade Lloyd, MD Phone: [phone]  Fax: [phone]   11/28/2015, 12:53 PM

## 2016-02-20 LAB — HEMOGLOBIN A1C: HEMOGLOBIN A1C: 7.3

## 2016-03-05 ENCOUNTER — Encounter: Payer: Self-pay | Admitting: "Endocrinology

## 2016-03-05 ENCOUNTER — Ambulatory Visit (INDEPENDENT_AMBULATORY_CARE_PROVIDER_SITE_OTHER): Payer: BLUE CROSS/BLUE SHIELD | Admitting: "Endocrinology

## 2016-03-05 VITALS — BP 143/80 | HR 75 | Ht 69.0 in | Wt 180.0 lb

## 2016-03-05 DIAGNOSIS — E1165 Type 2 diabetes mellitus with hyperglycemia: Secondary | ICD-10-CM

## 2016-03-05 DIAGNOSIS — F17210 Nicotine dependence, cigarettes, uncomplicated: Secondary | ICD-10-CM | POA: Insufficient documentation

## 2016-03-05 DIAGNOSIS — I10 Essential (primary) hypertension: Secondary | ICD-10-CM | POA: Diagnosis not present

## 2016-03-05 DIAGNOSIS — E118 Type 2 diabetes mellitus with unspecified complications: Secondary | ICD-10-CM | POA: Diagnosis not present

## 2016-03-05 DIAGNOSIS — E785 Hyperlipidemia, unspecified: Secondary | ICD-10-CM

## 2016-03-05 DIAGNOSIS — Z794 Long term (current) use of insulin: Secondary | ICD-10-CM

## 2016-03-05 DIAGNOSIS — IMO0002 Reserved for concepts with insufficient information to code with codable children: Secondary | ICD-10-CM

## 2016-03-05 NOTE — Progress Notes (Signed)
Subjective:    Patient ID: Randy [name], male    DOB: 11-06-1950, PCP Sherrie Mustache, MD   Past Medical History:  Diagnosis Date  . Diabetes mellitus without complication ([place])   . Hyperlipidemia   . Hypertension    Past Surgical History:  Procedure Laterality Date  . HEMORRHOID SURGERY N/A 06/08/2013   Procedure: HEMORRHOIDECTOMY;  Surgeon: Leighton Ruff, MD;  Location: [facility];  Service: General;  Laterality: N/A;  . NO PAST SURGERIES     Social History   Social History  . Marital status: Single    Spouse name: N/A  . Number of children: N/A  . Years of education: N/A   Social History Main Topics  . Smoking status: Former Smoker    Years: 15.00    Quit date: 06/19/2011  . Smokeless tobacco: Never Used  . Alcohol use Yes     Comment: rarely  . Drug use: No  . Sexual activity: Not on file   Other Topics Concern  . Not on file   Social History Narrative  . No narrative on file   Outpatient Encounter Prescriptions as of 03/05/2016  Medication Sig  . aspirin EC 81 MG tablet Take 81 mg by mouth daily.  Marland Kitchen atorvastatin (LIPITOR) 40 MG tablet Take 40 mg by mouth at bedtime.  . benazepril (LOTENSIN) 40 MG tablet Take 40 mg by mouth daily.  Marland Kitchen FIBER SELECT GUMMIES PO Take 1 tablet by mouth daily.  . hydrochlorothiazide (MICROZIDE) 12.5 MG capsule Take 12.5 mg by mouth daily.  . Insulin Glargine (LANTUS SOLOSTAR) 100 UNIT/ML Solostar Pen Inject 40 Units into the skin daily at 10 pm. (Patient taking differently: Inject 50 Units into the skin at bedtime. )  . losartan (COZAAR) 100 MG tablet Take 100 mg by mouth daily.  . metFORMIN (GLUCOPHAGE) 1000 MG tablet Take 1,000 mg by mouth 2 (two) times daily with a meal.  . potassium chloride (K-DUR) 10 MEQ tablet Take 10 mEq by mouth 2 (two) times daily.  . sertraline (ZOLOFT) 100 MG tablet Take 100 mg by mouth daily.   No facility-administered encounter medications on file as of 03/05/2016.     ALLERGIES: No Known Allergies VACCINATION STATUS: Immunization History  Administered Date(s) Administered  . Influenza,inj,Quad PF,36+ Mos 03/02/2014    Diabetes  He presents for his follow-up diabetic visit. He has type 2 diabetes mellitus. Onset time: He was diagnosed at approximate age of 80 years. His disease course has been improving. There are no hypoglycemic associated symptoms. Pertinent negatives for hypoglycemia include no confusion, headaches, pallor or seizures. There are no diabetic associated symptoms. Pertinent negatives for diabetes include no chest pain, no fatigue, no polydipsia, no polyphagia, no polyuria and no weakness. There are no hypoglycemic complications. Symptoms are improving. There are no diabetic complications. Risk factors for coronary artery disease include dyslipidemia, diabetes mellitus, hypertension, sedentary lifestyle and male sex. Current diabetic treatment includes insulin injections and oral agent (monotherapy). He is compliant with treatment most of the time. His weight is stable. He is following a generally unhealthy diet. He participates in exercise intermittently. Home blood sugar record trend: He is A1c from December 2016 was higher at 8.8% from prior A1c of 8.2%. His dinner blood glucose range is generally 140-180 mg/dl. His overall blood glucose range is 140-180 mg/dl. An ACE inhibitor/angiotensin II receptor blocker is being taken.  Hyperlipidemia  This is a chronic problem. The current episode started more than 1 year ago. Pertinent negatives  include no chest pain, myalgias or shortness of breath. Current antihyperlipidemic treatment includes statins. Risk factors for coronary artery disease include diabetes mellitus, dyslipidemia, hypertension and male sex.  Hypertension  This is a chronic problem. The current episode started more than 1 year ago. Pertinent negatives include no chest pain, headaches, neck pain, palpitations or shortness of breath.  Past treatments include ACE inhibitors.     Review of Systems  Constitutional: Negative for chills, fatigue, fever and unexpected weight change.  HENT: Negative for dental problem, mouth sores and trouble swallowing.   Eyes: Negative for visual disturbance.  Respiratory: Negative for cough, choking, chest tightness, shortness of breath and wheezing.   Cardiovascular: Negative for chest pain, palpitations and leg swelling.  Gastrointestinal: Negative for abdominal distention, abdominal pain, constipation, diarrhea, nausea and vomiting.  Endocrine: Negative for polydipsia, polyphagia and polyuria.  Genitourinary: Negative for dysuria, flank pain, hematuria and urgency.  Musculoskeletal: Negative for back pain, gait problem, myalgias and neck pain.  Skin: Negative for pallor, rash and wound.  Neurological: Negative for seizures, syncope, weakness, numbness and headaches.  Psychiatric/Behavioral: Negative.  Negative for confusion and dysphoric mood.    Objective:    BP (!) 143/80   Pulse 75   Ht 5\' 9"  (1.753 m)   Wt 180 lb (81.6 kg)   BMI 26.58 kg/m   Wt Readings from Last 3 Encounters:  03/05/16 180 lb (81.6 kg)  11/28/15 177 lb (80.3 kg)  08/22/15 175 lb (79.4 kg)    Physical Exam  Constitutional: He is oriented to person, place, and time. He appears well-developed and well-nourished. He is cooperative. No distress.  HENT:  Head: Normocephalic and atraumatic.  Eyes: EOM are normal.  Neck: Normal range of motion. Neck supple. No tracheal deviation present. No thyromegaly present.  Cardiovascular: Normal rate, S1 normal, S2 normal and normal heart sounds.  Exam reveals no gallop.   No murmur heard. Pulses:      Dorsalis pedis pulses are 1+ on the right side, and 1+ on the left side.       Posterior tibial pulses are 1+ on the right side, and 1+ on the left side.  Pulmonary/Chest: Breath sounds normal. No respiratory distress. He has no wheezes.  Abdominal: Soft. Bowel sounds  are normal. He exhibits no distension. There is no tenderness. There is no guarding and no CVA tenderness.  Musculoskeletal: He exhibits no edema.       Right shoulder: He exhibits no swelling and no deformity.  Neurological: He is alert and oriented to person, place, and time. He has normal strength and normal reflexes. No cranial nerve deficit or sensory deficit. Gait normal.  Skin: Skin is warm and dry. No rash noted. No cyanosis. Nails show no clubbing.  Psychiatric: He has a normal mood and affect. His speech is normal and behavior is normal. Judgment and thought content normal. Cognition and memory are normal.    Results for orders placed or performed in visit on 03/05/16  Hemoglobin A1c  Result Value Ref Range   Hemoglobin A1C 7.3    Complete Blood Count (Most recent): Lab Results  Component Value Date   WBC 14.8 (H) 06/08/2013   HGB 16.1 06/08/2013   HCT 44.3 06/08/2013   MCV 96.3 06/08/2013   PLT 187 06/08/2013   Chemistry (most recent): Lab Results  Component Value Date   NA 137 06/08/2013   K 3.5 (L) 06/08/2013   CL 95 (L) 06/08/2013   CO2 27 06/08/2013  BUN 19 06/08/2013   CREATININE 1.02 06/08/2013    Assessment & Plan:   1. Uncontrolled type 2 diabetes mellitus with complication, with long-term current use of insulin ([place])   - patient remains at a high risk for more acute and chronic complications of diabetes which include CAD, CVA, CKD, retinopathy, and neuropathy. These are all discussed in detail with the patient.  Patient came with controlled fasting glucose profile, and  recent A1c of 7.3%,  generally improving from 10.1 %.  Glucose logs and insulin administration records pertaining to this visit,  to be scanned into patient's records.  Recent labs reviewed.   - I have re-counseled the patient on diet management   by adopting a carbohydrate restricted / protein rich  Diet.  - Suggestion is made for patient to avoid simple carbohydrates   from their  diet including Cakes , Desserts, Ice Cream,  Soda (  diet and regular) , Sweet Tea , Candies,  Chips, Cookies, Artificial Sweeteners,   and "Sugar-free" Products .  This will help patient to have stable blood glucose profile and potentially avoid unintended  Weight gain.  - Patient is advised to stick to a routine mealtimes to eat 3 meals  a day and avoid unnecessary snacks ( to snack only to correct hypoglycemia).   - I have approached patient with the following individualized plan to manage diabetes and patient agrees.  - I will continue  Lantus  50 units qhs , associated with monitoring of BG before breakfast. -he promises to do better on his diet. -if his a1c remains high next visit, he will be considered for prandial insulin or premixed insulin twice a day.  I will continue Metformin 1gm po BID.  - Patient specific target  for A1c; LDL, HDL, Triglycerides, and  Waist Circumference were discussed in detail.  2) BP/HTN: Uncontrolled, he took his medications late today. Continue current medications including ACEI/ARB. 3) Lipids/HPL:  continue statins. 4)  Weight/Diet: CDE consult in progress,  he has gained 10 pounds progressively since  March 2017 , exercise, and carbohydrates information provided.  5) Chronic Care/Health Maintenance:  -Patient is on ACEI/ARB and Statin medications and encouraged to continue to follow up with Ophthalmology, Podiatrist at least yearly or according to recommendations, and advised to  stay away from smoking. I have recommended yearly flu vaccine and pneumonia vaccination at least every 5 years; moderate intensity exercise for up to 150 minutes weekly; and  sleep for at least 7 hours a day.  - 25 minutes of time was spent on the care of this patient , 50% of which was applied for counseling on diabetes complications and their preventions.  - I advised patient to maintain close follow up with Sherrie Mustache, MD for primary care needs.  Patient is asked  to bring meter and  blood glucose logs during their next visit.   Follow up plan: -Return in about 3 months (around 06/04/2016) for follow up with pre-visit labs, meter, and logs.  Glade Lloyd, MD Phone: [phone]  Fax: [phone]   03/05/2016, 11:24 AM

## 2016-03-05 NOTE — Patient Instructions (Signed)

## 2016-05-21 LAB — HEMOGLOBIN A1C: Hemoglobin A1C: 8.4

## 2016-06-04 ENCOUNTER — Ambulatory Visit (INDEPENDENT_AMBULATORY_CARE_PROVIDER_SITE_OTHER): Payer: BLUE CROSS/BLUE SHIELD | Admitting: "Endocrinology

## 2016-06-04 ENCOUNTER — Encounter: Payer: Self-pay | Admitting: "Endocrinology

## 2016-06-04 VITALS — BP 163/83 | HR 80 | Ht 69.0 in | Wt 180.0 lb

## 2016-06-04 DIAGNOSIS — F339 Major depressive disorder, recurrent, unspecified: Secondary | ICD-10-CM | POA: Insufficient documentation

## 2016-06-04 DIAGNOSIS — F17200 Nicotine dependence, unspecified, uncomplicated: Secondary | ICD-10-CM | POA: Diagnosis not present

## 2016-06-04 DIAGNOSIS — E1165 Type 2 diabetes mellitus with hyperglycemia: Secondary | ICD-10-CM

## 2016-06-04 DIAGNOSIS — E782 Mixed hyperlipidemia: Secondary | ICD-10-CM

## 2016-06-04 DIAGNOSIS — Z794 Long term (current) use of insulin: Secondary | ICD-10-CM

## 2016-06-04 DIAGNOSIS — E118 Type 2 diabetes mellitus with unspecified complications: Secondary | ICD-10-CM

## 2016-06-04 DIAGNOSIS — IMO0002 Reserved for concepts with insufficient information to code with codable children: Secondary | ICD-10-CM

## 2016-06-04 DIAGNOSIS — I10 Essential (primary) hypertension: Secondary | ICD-10-CM

## 2016-06-04 MED ORDER — INSULIN GLARGINE 100 UNIT/ML SOLOSTAR PEN: 56.0000 [IU] | PEN_INJECTOR | Freq: Every day | SUBCUTANEOUS | 3 refills | Status: DC

## 2016-06-04 NOTE — Patient Instructions (Signed)

## 2016-06-04 NOTE — Progress Notes (Signed)
Subjective:    Patient ID: Randy [name], male    DOB: February 05, 1951, PCP Sherrie Mustache, MD   Past Medical History:  Diagnosis Date  . Diabetes mellitus without complication ([place])   . Hyperlipidemia   . Hypertension    Past Surgical History:  Procedure Laterality Date  . HEMORRHOID SURGERY N/A 06/08/2013   Procedure: HEMORRHOIDECTOMY;  Surgeon: Leighton Ruff, MD;  [facility];  Service: General;  Laterality: N/A;  . NO PAST SURGERIES     Social History   Social History  . Marital status: Single    Spouse name: N/A  . Number of children: N/A  . Years of education: N/A   Social History Main Topics  . Smoking status: Current Every Day Smoker    Years: 15.00    Last attempt to quit: 06/19/2011  . Smokeless tobacco: Never Used  . Alcohol use Yes     Comment: rarely  . Drug use: No  . Sexual activity: Not Asked   Other Topics Concern  . None   Social History Narrative  . None   Outpatient Encounter Prescriptions as of 06/04/2016  Medication Sig  . aspirin EC 81 MG tablet Take 81 mg by mouth daily.  [place] Kitchen atorvastatin (LIPITOR) 40 MG tablet Take 40 mg by mouth at bedtime.  . benazepril (LOTENSIN) 40 MG tablet Take 40 mg by mouth daily.  [place] Kitchen FIBER SELECT GUMMIES PO Take 1 tablet by mouth daily.  . hydrochlorothiazide (MICROZIDE) 12.5 MG capsule Take 12.5 mg by mouth daily.  . Insulin Glargine (LANTUS SOLOSTAR) 100 UNIT/ML Solostar Pen Inject 56 Units into the skin daily at 10 pm.  . losartan (COZAAR) 100 MG tablet Take 100 mg by mouth daily.  . metFORMIN (GLUCOPHAGE) 1000 MG tablet Take 1,000 mg by mouth 2 (two) times daily with a meal.  . potassium chloride (K-DUR) 10 MEQ tablet Take 10 mEq by mouth 2 (two) times daily.  . sertraline (ZOLOFT) 100 MG tablet Take 100 mg by mouth daily.  . [DISCONTINUED] Insulin Glargine (LANTUS SOLOSTAR) 100 UNIT/ML Solostar Pen Inject 40 Units into the skin daily at 10 pm. (Patient taking differently: Inject  50 Units into the skin at bedtime. )   No facility-administered encounter medications on file as of 06/04/2016.    ALLERGIES: No Known Allergies VACCINATION STATUS: Immunization History  Administered Date(s) Administered  . Influenza,inj,Quad PF,36+ Mos 03/02/2014    Diabetes  He presents for his follow-up diabetic visit. He has type 2 diabetes mellitus. Onset time: He was diagnosed at approximate age of 30 years. His disease course has been worsening. There are no hypoglycemic associated symptoms. Pertinent negatives for hypoglycemia include no confusion, headaches, pallor or seizures. There are no diabetic associated symptoms. Pertinent negatives for diabetes include no chest pain, no fatigue, no polydipsia, no polyphagia, no polyuria and no weakness. There are no hypoglycemic complications. Symptoms are worsening. There are no diabetic complications. Risk factors for coronary artery disease include dyslipidemia, diabetes mellitus, hypertension, sedentary lifestyle and male sex. Current diabetic treatment includes insulin injections and oral agent (monotherapy). He is compliant with treatment most of the time. His weight is stable. He is following a generally unhealthy diet. He participates in exercise intermittently. Home blood sugar record trend: He is A1c from December 2016 was higher at 8.8% from prior A1c of 8.2%. His dinner blood glucose range is generally 180-200 mg/dl. His overall blood glucose range is 180-200 mg/dl. An ACE inhibitor/angiotensin II receptor blocker is being taken.  Hyperlipidemia  This is a chronic problem. The current episode started more than 1 year ago. Pertinent negatives include no chest pain, myalgias or shortness of breath. Current antihyperlipidemic treatment includes statins. Risk factors for coronary artery disease include diabetes mellitus, dyslipidemia, hypertension and male sex.  Hypertension  This is a chronic problem. The current episode started more than 1  year ago. Pertinent negatives include no chest pain, headaches, neck pain, palpitations or shortness of breath. Past treatments include ACE inhibitors.     Review of Systems  Constitutional: Negative for chills, fatigue, fever and unexpected weight change.  HENT: Negative for dental problem, mouth sores and trouble swallowing.   Eyes: Negative for visual disturbance.  Respiratory: Negative for cough, choking, chest tightness, shortness of breath and wheezing.   Cardiovascular: Negative for chest pain, palpitations and leg swelling.  Gastrointestinal: Negative for abdominal distention, abdominal pain, constipation, diarrhea, nausea and vomiting.  Endocrine: Negative for polydipsia, polyphagia and polyuria.  Genitourinary: Negative for dysuria, flank pain, hematuria and urgency.  Musculoskeletal: Negative for back pain, gait problem, myalgias and neck pain.  Skin: Negative for pallor, rash and wound.  Neurological: Negative for seizures, syncope, weakness, numbness and headaches.  Psychiatric/Behavioral: Negative.  Negative for confusion and dysphoric mood.    Objective:    BP (!) 163/83   Pulse 80   Ht 5\' 9"  (1.753 m)   Wt 180 lb (81.6 kg)   BMI 26.58 kg/m   Wt Readings from Last 3 Encounters:  06/04/16 180 lb (81.6 kg)  03/05/16 180 lb (81.6 kg)  11/28/15 177 lb (80.3 kg)    Physical Exam  Constitutional: He is oriented to person, place, and time. He appears well-developed and well-nourished. He is cooperative. No distress.  HENT:  Head: Normocephalic and atraumatic.  Eyes: EOM are normal.  Neck: Normal range of motion. Neck supple. No tracheal deviation present. No thyromegaly present.  Cardiovascular: Normal rate, S1 normal, S2 normal and normal heart sounds.  Exam reveals no gallop.   No murmur heard. Pulses:      Dorsalis pedis pulses are 1+ on the right side, and 1+ on the left side.       Posterior tibial pulses are 1+ on the right side, and 1+ on the left side.   Pulmonary/Chest: Breath sounds normal. No respiratory distress. He has no wheezes.  Abdominal: Soft. Bowel sounds are normal. He exhibits no distension. There is no tenderness. There is no guarding and no CVA tenderness.  Musculoskeletal: He exhibits no edema.       Right shoulder: He exhibits no swelling and no deformity.  Neurological: He is alert and oriented to person, place, and time. He has normal strength and normal reflexes. No cranial nerve deficit or sensory deficit. Gait normal.  Skin: Skin is warm and dry. No rash noted. No cyanosis. Nails show no clubbing.  Psychiatric: He has a normal mood and affect. His speech is normal and behavior is normal. Judgment and thought content normal. Cognition and memory are normal.    Results for orders placed or performed in visit on 06/04/16  Hemoglobin A1c  Result Value Ref Range   Hemoglobin A1C 8.4    Complete Blood Count (Most recent): Lab Results  Component Value Date   WBC 14.8 (H) 06/08/2013   HGB 16.1 06/08/2013   HCT 44.3 06/08/2013   MCV 96.3 06/08/2013   PLT 187 06/08/2013   Chemistry (most recent): Lab Results  Component Value Date   NA 137 06/08/2013  K 3.5 (L) 06/08/2013   CL 95 (L) 06/08/2013   CO2 27 06/08/2013   BUN 19 06/08/2013   CREATININE 1.02 06/08/2013    Assessment & Plan:   1. Uncontrolled type 2 diabetes mellitus with complication, with long-term current use of insulin ([place])   - patient remains at a high risk for more acute and chronic complications of diabetes which include CAD, CVA, CKD, retinopathy, and neuropathy. These are all discussed in detail with the patient.  Patient came with controlled fasting glucose profile, and  recent A1c of 8.4% increasing from 7.3 %,  generally improving from 10.1 %.   - This is due to dietary indiscretion since his last visit.  Glucose logs and insulin administration records pertaining to this visit,  to be scanned into patient's records.  Recent labs  reviewed.   - I have re-counseled the patient on diet management   by adopting a carbohydrate restricted / protein rich  Diet.  - Suggestion is made for patient to avoid simple carbohydrates   from their diet including Cakes , Desserts, Ice Cream,  Soda (  diet and regular) , Sweet Tea , Candies,  Chips, Cookies, Artificial Sweeteners,   and "Sugar-free" Products .  This will help patient to have stable blood glucose profile and potentially avoid unintended  Weight gain.  - Patient is advised to stick to a routine mealtimes to eat 3 meals  a day and avoid unnecessary snacks ( to snack only to correct hypoglycemia).   - I have approached patient with the following individualized plan to manage diabetes and patient agrees.  - I will maximize Lantus to 56 units daily at bedtime, associated with monitoring of BG before breakfast and at bedtime. -he promises to do better on his diet. -if his a1c remains high next visit, he will be considered for prandial insulin or premixed insulin twice a day.  I will continue Metformin 1gm po BID.  - Patient specific target  for A1c; LDL, HDL, Triglycerides, and  Waist Circumference were discussed in detail.  2) BP/HTN: Uncontrolled, he took his medications late today. Continue current medications including ACEI/ARB. 3) Lipids/HPL:  continue statins. 4)  Weight/Diet: CDE consult in progress,  he has gained 10 pounds progressively since  March 2017 , exercise, and carbohydrates information provided.  5) Chronic Care/Health Maintenance:  -Patient is on ACEI/ARB and Statin medications and encouraged to continue to follow up with Ophthalmology, Podiatrist at least yearly or according to recommendations, and advised to quit (he has resumed heavy smoking)  smoking. I have recommended yearly flu vaccine and pneumonia vaccination at least every 5 years; moderate intensity exercise for up to 150 minutes weekly; and  sleep for at least 7 hours a day.  - 25 minutes of  time was spent on the care of this patient , 50% of which was applied for counseling on diabetes complications and their preventions.  - I advised patient to maintain close follow up with Sherrie Mustache, MD for primary care needs.  Patient is asked to bring meter and  blood glucose logs during their next visit.   Follow up plan: -Return in about 3 months (around 09/02/2016) for follow up with pre-visit labs, meter, and logs.  Glade Lloyd, MD Phone: [phone]  Fax: [phone]   06/04/2016, 9:12 AM

## 2016-08-13 LAB — HEMOGLOBIN A1C: HEMOGLOBIN A1C: 7.6

## 2016-09-03 ENCOUNTER — Ambulatory Visit (INDEPENDENT_AMBULATORY_CARE_PROVIDER_SITE_OTHER): Payer: BLUE CROSS/BLUE SHIELD | Admitting: "Endocrinology

## 2016-09-03 ENCOUNTER — Encounter: Payer: Self-pay | Admitting: "Endocrinology

## 2016-09-03 VITALS — BP 162/80 | HR 76 | Ht 69.0 in | Wt 183.0 lb

## 2016-09-03 DIAGNOSIS — E1165 Type 2 diabetes mellitus with hyperglycemia: Secondary | ICD-10-CM | POA: Diagnosis not present

## 2016-09-03 DIAGNOSIS — E118 Type 2 diabetes mellitus with unspecified complications: Secondary | ICD-10-CM | POA: Diagnosis not present

## 2016-09-03 DIAGNOSIS — F17200 Nicotine dependence, unspecified, uncomplicated: Secondary | ICD-10-CM

## 2016-09-03 DIAGNOSIS — Z794 Long term (current) use of insulin: Secondary | ICD-10-CM

## 2016-09-03 DIAGNOSIS — I10 Essential (primary) hypertension: Secondary | ICD-10-CM | POA: Diagnosis not present

## 2016-09-03 DIAGNOSIS — IMO0002 Reserved for concepts with insufficient information to code with codable children: Secondary | ICD-10-CM

## 2016-09-03 DIAGNOSIS — E782 Mixed hyperlipidemia: Secondary | ICD-10-CM

## 2016-09-03 NOTE — Patient Instructions (Signed)

## 2016-09-03 NOTE — Progress Notes (Signed)
Subjective:    Patient ID: Randy [name], male    DOB: 1950-08-21, PCP Sherrie Mustache, MD   Past Medical History:  Diagnosis Date  . Diabetes mellitus without complication ([place])   . Hyperlipidemia   . Hypertension    Past Surgical History:  Procedure Laterality Date  . HEMORRHOID SURGERY N/A 06/08/2013   Procedure: HEMORRHOIDECTOMY;  Surgeon: Leighton Ruff, MD;  [facility];  Service: General;  Laterality: N/A;  . NO PAST SURGERIES     Social History   Social History  . Marital status: Single    Spouse name: N/A  . Number of children: N/A  . Years of education: N/A   Social History Main Topics  . Smoking status: Current Every Day Smoker    Years: 15.00    Last attempt to quit: 06/19/2011  . Smokeless tobacco: Never Used  . Alcohol use Yes     Comment: rarely  . Drug use: No  . Sexual activity: Not Asked   Other Topics Concern  . None   Social History Narrative  . None   Outpatient Encounter Prescriptions as of 09/03/2016  Medication Sig  . aspirin EC 81 MG tablet Take 81 mg by mouth daily.  Mar[place] Kitchen atorvastatin (LIPITOR) 40 MG tablet Take 40 mg by mouth at bedtime.  . benazepril (LOTENSIN) 40 MG tablet Take 40 mg by mouth daily.  [place] Kitchen FIBER SELECT GUMMIES PO Take 1 tablet by mouth daily.  . hydrochlorothiazide (MICROZIDE) 12.5 MG capsule Take 12.5 mg by mouth daily.  . Insulin Glargine (LANTUS SOLOSTAR) 100 UNIT/ML Solostar Pen Inject 56 Units into the skin daily at 10 pm.  . losartan (COZAAR) 100 MG tablet Take 100 mg by mouth daily.  . metFORMIN (GLUCOPHAGE) 1000 MG tablet Take 1,000 mg by mouth 2 (two) times daily with a meal.  . potassium chloride (K-DUR) 10 MEQ tablet Take 10 mEq by mouth 2 (two) times daily.  . sertraline (ZOLOFT) 100 MG tablet Take 100 mg by mouth daily.   No facility-administered encounter medications on file as of 09/03/2016.    ALLERGIES: No Known Allergies VACCINATION STATUS: Immunization History   Administered Date(s) Administered  . Influenza,inj,Quad PF,36+ Mos 03/02/2014    Diabetes  He presents for his follow-up diabetic visit. He has type 2 diabetes mellitus. Onset time: He was diagnosed at approximate age of 44 years. His disease course has been improving. There are no hypoglycemic associated symptoms. Pertinent negatives for hypoglycemia include no confusion, headaches, pallor or seizures. There are no diabetic associated symptoms. Pertinent negatives for diabetes include no chest pain, no fatigue, no polydipsia, no polyphagia, no polyuria and no weakness. There are no hypoglycemic complications. Symptoms are improving. There are no diabetic complications. Risk factors for coronary artery disease include dyslipidemia, diabetes mellitus, hypertension, sedentary lifestyle and male sex. Current diabetic treatment includes insulin injections and oral agent (monotherapy). He is compliant with treatment most of the time. His weight is stable. He is following a generally unhealthy diet. He participates in exercise intermittently. Home blood sugar record trend: He is A1c from December 2016 was higher at 8.8% from prior A1c of 8.2%. His breakfast blood glucose range is generally 140-180 mg/dl. His overall blood glucose range is 140-180 mg/dl. An ACE inhibitor/angiotensin II receptor blocker is being taken.  Hyperlipidemia  This is a chronic problem. The current episode started more than 1 year ago. Pertinent negatives include no chest pain, myalgias or shortness of breath. Current antihyperlipidemic treatment includes statins.  Risk factors for coronary artery disease include diabetes mellitus, dyslipidemia, hypertension and male sex.  Hypertension  This is a chronic problem. The current episode started more than 1 year ago. Pertinent negatives include no chest pain, headaches, neck pain, palpitations or shortness of breath. Past treatments include ACE inhibitors.     Review of Systems   Constitutional: Negative for chills, fatigue, fever and unexpected weight change.  HENT: Negative for dental problem, mouth sores and trouble swallowing.   Eyes: Negative for visual disturbance.  Respiratory: Negative for cough, choking, chest tightness, shortness of breath and wheezing.   Cardiovascular: Negative for chest pain, palpitations and leg swelling.  Gastrointestinal: Negative for abdominal distention, abdominal pain, constipation, diarrhea, nausea and vomiting.  Endocrine: Negative for polydipsia, polyphagia and polyuria.  Genitourinary: Negative for dysuria, flank pain, hematuria and urgency.  Musculoskeletal: Negative for back pain, gait problem, myalgias and neck pain.  Skin: Negative for pallor, rash and wound.  Neurological: Negative for seizures, syncope, weakness, numbness and headaches.  Psychiatric/Behavioral: Negative.  Negative for confusion and dysphoric mood.    Objective:    BP (!) 162/80   Pulse 76   Ht 5\' 9"  (1.753 m)   Wt 183 lb (83 kg)   BMI 27.02 kg/m   Wt Readings from Last 3 Encounters:  09/03/16 183 lb (83 kg)  06/04/16 180 lb (81.6 kg)  03/05/16 180 lb (81.6 kg)    Physical Exam  Constitutional: He is oriented to person, place, and time. He appears well-developed and well-nourished. He is cooperative. No distress.  HENT:  Head: Normocephalic and atraumatic.  Eyes: EOM are normal.  Neck: Normal range of motion. Neck supple. No tracheal deviation present. No thyromegaly present.  Cardiovascular: Normal rate, S1 normal, S2 normal and normal heart sounds.  Exam reveals no gallop.   No murmur heard. Pulses:      Dorsalis pedis pulses are 1+ on the right side, and 1+ on the left side.       Posterior tibial pulses are 1+ on the right side, and 1+ on the left side.  Pulmonary/Chest: Breath sounds normal. No respiratory distress. He has no wheezes.  Abdominal: Soft. Bowel sounds are normal. He exhibits no distension. There is no tenderness. There is  no guarding and no CVA tenderness.  Musculoskeletal: He exhibits no edema.       Right shoulder: He exhibits no swelling and no deformity.  Neurological: He is alert and oriented to person, place, and time. He has normal strength and normal reflexes. No cranial nerve deficit or sensory deficit. Gait normal.  Skin: Skin is warm and dry. No rash noted. No cyanosis. Nails show no clubbing.  Psychiatric: He has a normal mood and affect. His speech is normal and behavior is normal. Judgment and thought content normal. Cognition and memory are normal.    Results for orders placed or performed in visit on 09/03/16  Hemoglobin A1c  Result Value Ref Range   Hemoglobin A1C 7.6    Complete Blood Count (Most recent): Lab Results  Component Value Date   WBC 14.8 (H) 06/08/2013   HGB 16.1 06/08/2013   HCT 44.3 06/08/2013   MCV 96.3 06/08/2013   PLT 187 06/08/2013   Chemistry (most recent): Lab Results  Component Value Date   NA 137 06/08/2013   K 3.5 (L) 06/08/2013   CL 95 (L) 06/08/2013   CO2 27 06/08/2013   BUN 19 06/08/2013   CREATININE 1.02 06/08/2013    Assessment & Plan:  1. Uncontrolled type 2 diabetes mellitus with complication, with long-term current use of insulin ([place])   - patient remains at a high risk for more acute and chronic complications of diabetes which include CAD, CVA, CKD, retinopathy, and neuropathy. These are all discussed in detail with the patient.  Patient came with controlled fasting glucose profile, and  recent A1c of  7.6%, generally improving from 10.1 %.   - This is due to dietary indiscretion since his last visit.  Glucose logs and insulin administration records pertaining to this visit,  to be scanned into patient's records.  Recent labs reviewed.   - I have re-counseled the patient on diet management   by adopting a carbohydrate restricted / protein rich  Diet.  - Suggestion is made for patient to avoid simple carbohydrates   from their diet  including Cakes , Desserts, Ice Cream,  Soda (  diet and regular) , Sweet Tea , Candies,  Chips, Cookies, Artificial Sweeteners,   and "Sugar-free" Products .  This will help patient to have stable blood glucose profile and potentially avoid unintended  Weight gain.  - Patient is advised to stick to a routine mealtimes to eat 3 meals  a day and avoid unnecessary snacks ( to snack only to correct hypoglycemia).   - I have approached patient with the following individualized plan to manage diabetes and patient agrees.  - I will  continue Lantus  56 units daily at bedtime, associated with monitoring of BG before breakfast and at bedtime. -he promises to do better on his diet. -if his a1c remains high next visit, he will be considered for prandial insulin or premixed insulin twice a day.  I will continue Metformin 1gm po BID.  - Patient specific target  for A1c; LDL, HDL, Triglycerides, and  Waist Circumference were discussed in detail.  2) BP/HTN: Uncontrolled, he took his medications late today. Continue current medications including ACEI/ARB. 3) Lipids/HPL:  continue statins. 4)  Weight/Diet: CDE consult in progress,  he has gained 10 pounds progressively since  March 2017 , exercise, and carbohydrates information provided.  5) Chronic Care/Health Maintenance:  -Patient is on ACEI/ARB and Statin medications and encouraged to continue to follow up with Ophthalmology, Podiatrist at least yearly or according to recommendations, and advised to quit (he has resumed heavy smoking)  smoking. I have recommended yearly flu vaccine and pneumonia vaccination at least every 5 years; moderate intensity exercise for up to 150 minutes weekly; and  sleep for at least 7 hours a day.  - 25 minutes of time was spent on the care of this patient , 50% of which was applied for counseling on diabetes complications and their preventions.  - I advised patient to maintain close follow up with Sherrie Mustache, MD  for primary care needs.  Patient is asked to bring meter and  blood glucose logs during their next visit.   Follow up plan: -Return in about 3 months (around 12/04/2016) for follow up with pre-visit labs, meter, and logs.  Glade Lloyd, MD Phone: [phone]  Fax: [phone]   09/03/2016, 9:34 AM

## 2016-10-20 ENCOUNTER — Emergency Department (HOSPITAL_COMMUNITY)
Admission: EM | Admit: 2016-10-20 | Discharge: 2016-10-21 | Disposition: A | Discharge status: Home / Self Care | Payer: BLUE CROSS/BLUE SHIELD | Attending: Emergency Medicine | Admitting: Emergency Medicine

## 2016-10-20 ENCOUNTER — Encounter (HOSPITAL_COMMUNITY): Payer: Self-pay | Admitting: *Deleted

## 2016-10-20 DIAGNOSIS — Z7982 Long term (current) use of aspirin: Secondary | ICD-10-CM | POA: Insufficient documentation

## 2016-10-20 DIAGNOSIS — I10 Essential (primary) hypertension: Secondary | ICD-10-CM | POA: Diagnosis not present

## 2016-10-20 DIAGNOSIS — Z794 Long term (current) use of insulin: Secondary | ICD-10-CM | POA: Insufficient documentation

## 2016-10-20 DIAGNOSIS — T7840XA Allergy, unspecified, initial encounter: Secondary | ICD-10-CM | POA: Diagnosis not present

## 2016-10-20 DIAGNOSIS — E119 Type 2 diabetes mellitus without complications: Secondary | ICD-10-CM | POA: Insufficient documentation

## 2016-10-20 DIAGNOSIS — F17200 Nicotine dependence, unspecified, uncomplicated: Secondary | ICD-10-CM | POA: Diagnosis not present

## 2016-10-20 NOTE — ED Triage Notes (Signed)
Pt woke up around 2130 this evening with hives all over body and sob; pt states the only new thing he has been exposed to is new carpet in his home; pt still has diffuse wheezing all over after albuterol 5mg , solumedrol 125mg , benadryl 50mg  and zantac 50mg  all IV; pt states he feels much better and hives have disappeared

## 2016-10-20 NOTE — ED Provider Notes (Signed)
[facility] DEPT Provider Note   CSN: [id number] Arrival date & time: 10/20/16  2256 By signing my name below, I, Dyke Brackett, attest that this documentation has been prepared under the direction and in the presence of Delora Fuel, MD . Electronically Signed: Dyke Brackett, Scribe. 10/20/2016. 11:34 PM.   History   Chief Complaint Chief Complaint  Patient presents with  . Allergic Reaction   HPI Randy [name] is a 66 y.o. male who presents to the Emergency Department complaining of sudden onset, progressively improving shortness of breath with associated diffuse hives onset tonight at 9:30. Pt was given 5 mg albuterol, 125 mg solumedrol, 50 mg Benadryl and 50 mg Zantac with significant relief. Pt states his symptoms have almost completely resolved. Per pt, he had new carpet installed in his home today; no new soaps, lotions, detergents, foods, animals, plants, or medications. Pt has no other acute complaints or associated symptoms at this time.    The history is provided by the patient. No language interpreter was used.   Past Medical History:  Diagnosis Date  . Diabetes mellitus without complication ([place])   . Hyperlipidemia   . Hypertension     Patient Active Problem List   Diagnosis Date Noted  . Current smoker 06/04/2016  . Uncontrolled type 2 diabetes mellitus with complication, with long-term current use of insulin ([place]) 05/09/2015  . Essential hypertension, benign 05/09/2015  . Hyperlipidemia 05/09/2015    Past Surgical History:  Procedure Laterality Date  . HEMORRHOID SURGERY N/A 06/08/2013   Procedure: HEMORRHOIDECTOMY;  Surgeon: Leighton Ruff, MD;  Location: [facility];  Service: General;  Laterality: N/A;  . NO PAST SURGERIES      Home Medications    Prior to Admission medications   Medication Sig Start Date End Date Taking? Authorizing Provider  aspirin EC 81 MG tablet Take 81 mg by mouth daily.   Yes [provider]    atorvastatin (LIPITOR) 40 MG tablet Take 40 mg by mouth every morning.    Yes [provider]  benazepril (LOTENSIN) 40 MG tablet Take 40 mg by mouth daily.   Yes [provider]  hydrochlorothiazide (MICROZIDE) 12.5 MG capsule Take 12.5 mg by mouth daily.   Yes [provider]  Insulin Glargine (LANTUS SOLOSTAR) 100 UNIT/ML Solostar Pen Inject 56 Units into the skin daily at 10 pm. 06/04/16  Yes Nida, Marella Chimes, MD  losartan (COZAAR) 100 MG tablet Take 100 mg by mouth daily.   Yes [provider]  metFORMIN (GLUCOPHAGE) 1000 MG tablet Take 1,000 mg by mouth 2 (two) times daily with a meal.   Yes [provider]  Potassium Chloride ER 20 MEQ TBCR Take 20 mEq by mouth daily.    Yes [provider]  psyllium (HYDROCIL/METAMUCIL) 95 % PACK Take 1 packet by mouth daily.   Yes [provider]  sertraline (ZOLOFT) 100 MG tablet Take 100 mg by mouth daily.   Yes [provider]    Family History History reviewed. No pertinent family history.  Social History Social History  Substance Use Topics  . Smoking status: Current Every Day Smoker    Years: 15.00    Last attempt to quit: 06/19/2011  . Smokeless tobacco: Never Used  . Alcohol use Yes     Comment: rarely   Allergies   Patient has no known allergies.  Review of Systems Review of Systems  Respiratory: Positive for shortness of breath.   Skin: Positive for rash.  All  other systems reviewed and are negative.  Physical Exam Updated Vital Signs BP (!) 194/92 (BP Location: Right Arm)   Pulse 86   Temp 97.7 F (36.5 C) (Oral)   Resp 20   Ht 5\' 9"  (1.753 m)   Wt 185 lb (83.9 kg)   SpO2 98%   BMI 27.32 kg/m   Physical Exam  Constitutional: He is oriented to person, place, and time. He appears well-developed and well-nourished.  HENT:  Head: Normocephalic and atraumatic.  Eyes: EOM are normal. Pupils are equal, round, and reactive to light.  Neck:  Normal range of motion. Neck supple. No JVD present.  Cardiovascular: Normal rate, regular rhythm and normal heart sounds.   No murmur heard. Pulmonary/Chest: Effort normal and breath sounds normal. He has no wheezes. He has no rales. He exhibits no tenderness.  Abdominal: Soft. Bowel sounds are normal. He exhibits no distension and no mass. There is no tenderness.  Musculoskeletal: Normal range of motion. He exhibits no edema.  Lymphadenopathy:    He has no cervical adenopathy.  Neurological: He is alert and oriented to person, place, and time. No cranial nerve deficit. He exhibits normal muscle tone. Coordination normal.  Skin: Skin is warm and dry. No rash noted.  Psychiatric: He has a normal mood and affect. His behavior is normal. Judgment and thought content normal.  Nursing note and vitals reviewed.   ED Treatments / Results  DIAGNOSTIC STUDIES:  Oxygen Saturation is 98% on RA, normal by my interpretation.    COORDINATION OF CARE:  11:32 PM Discussed treatment plan with pt at bedside and pt agreed to plan.    EKG  EKG Interpretation  Date/Time:  Monday Oct 20 2016 23:03:14 EDT Ventricular Rate:  84 PR Interval:    QRS Duration: 103 QT Interval:  393 QTC Calculation: 465 R Axis:   47 Text Interpretation:  Sinus rhythm Borderline repolarization abnormality When compared with ECG of 05/27/2013, T wave inversion Anterolateral leads now less evident Confirmed by Delora Fuel ([id number]) on 10/20/2016 11:18:01 PM      Procedures Procedures (including critical care time)  Medications Ordered in ED Medications - No data to display   Initial Impression / Assessment and Plan / ED Course  I have reviewed the triage vital signs and the nursing notes.   Apparent allergic reaction which has been successfully treated by EMS with diphenhydramine, ranitidine, methylprednisolone, and albuterol. No residual signs of allergy. Old records are reviewed, and I see no relevant past visits.  He will be observed in the emergency department.  He is observed in the ED for 3 hours with no recurrence of symptoms. He is discharged with prescription for prednisone and advised to use over-the-counter second-generation antihistamines for the next week. Return should symptoms worsen. Follow-up with PCP.  Final Clinical Impressions(s) / ED Diagnoses   Final diagnoses:  Allergic reaction, initial encounter    New Prescriptions New Prescriptions   PREDNISONE (DELTASONE) 50 MG TABLET    Take 1 tablet (50 mg total) by mouth daily.   I personally performed the services described in this documentation, which was scribed in my presence. The recorded information has been reviewed and is accurate.       Delora Fuel, MD 17/61/60 [phone]

## 2016-10-20 NOTE — ED Notes (Signed)
ED Provider at bedside. 

## 2016-10-20 NOTE — ED Notes (Signed)
EKG done and seen by Dr Roxanne Mins. Patient on 12 lead cardiac monitoring at this time.

## 2016-10-21 DIAGNOSIS — T782XXA Anaphylactic shock, unspecified, initial encounter: Secondary | ICD-10-CM | POA: Insufficient documentation

## 2016-10-21 MED ORDER — PREDNISONE 50 MG PO TABS: 50.0000 mg | ORAL_TABLET | Freq: Every day | ORAL | 0 refills | Status: DC

## 2016-10-21 NOTE — ED Notes (Signed)
Patient walked to restroom at this time. Patient returned to room with no complaints.

## 2016-10-21 NOTE — Discharge Instructions (Signed)
Take Claritin or Zyrtec once a day for the next week. Take Benadryl as needed if hives come back. Return to the ED if symptoms are getting worse, or not being adequately controlled at home.

## 2016-12-17 LAB — BASIC METABOLIC PANEL
BUN: 17 (ref 4–21)
CREATININE: 0.9 (ref <=1.3)

## 2016-12-17 LAB — HEMOGLOBIN A1C: HEMOGLOBIN A1C: 8.9

## 2016-12-24 ENCOUNTER — Ambulatory Visit (INDEPENDENT_AMBULATORY_CARE_PROVIDER_SITE_OTHER): Payer: BLUE CROSS/BLUE SHIELD | Admitting: "Endocrinology

## 2016-12-24 ENCOUNTER — Encounter: Payer: Self-pay | Admitting: "Endocrinology

## 2016-12-24 VITALS — BP 161/90 | HR 82 | Ht 69.0 in | Wt 182.0 lb

## 2016-12-24 DIAGNOSIS — E118 Type 2 diabetes mellitus with unspecified complications: Secondary | ICD-10-CM

## 2016-12-24 DIAGNOSIS — IMO0002 Reserved for concepts with insufficient information to code with codable children: Secondary | ICD-10-CM

## 2016-12-24 DIAGNOSIS — E1165 Type 2 diabetes mellitus with hyperglycemia: Secondary | ICD-10-CM | POA: Diagnosis not present

## 2016-12-24 DIAGNOSIS — F17200 Nicotine dependence, unspecified, uncomplicated: Secondary | ICD-10-CM | POA: Diagnosis not present

## 2016-12-24 DIAGNOSIS — I10 Essential (primary) hypertension: Secondary | ICD-10-CM | POA: Diagnosis not present

## 2016-12-24 DIAGNOSIS — Z794 Long term (current) use of insulin: Secondary | ICD-10-CM

## 2016-12-24 DIAGNOSIS — E782 Mixed hyperlipidemia: Secondary | ICD-10-CM

## 2016-12-24 MED ORDER — INSULIN PEN NEEDLE 31G X 8 MM MISC: 1.0000 | 3 refills | Status: DC

## 2016-12-24 MED ORDER — INSULIN ASPART 100 UNIT/ML FLEXPEN: 8.0000 [IU] | PEN_INJECTOR | Freq: Three times a day (TID) | SUBCUTANEOUS | 2 refills | Status: DC

## 2016-12-24 MED ORDER — INSULIN GLARGINE 100 UNIT/ML SOLOSTAR PEN: 50.0000 [IU] | PEN_INJECTOR | Freq: Every day | SUBCUTANEOUS | 3 refills | Status: DC

## 2016-12-24 NOTE — Progress Notes (Signed)
Subjective:    Patient ID: Randy [name], male    DOB: [birth year], PCP Dione Housekeeper, MD   Past Medical History:  Diagnosis Date  . Diabetes mellitus without complication ([place])   . Hyperlipidemia   . Hypertension    Past Surgical History:  Procedure Laterality Date  . HEMORRHOID SURGERY N/A 06/08/2013   Procedure: HEMORRHOIDECTOMY;  Surgeon: Leighton Ruff, MD;  Location: [facility];  Service: General;  Laterality: N/A;  . NO PAST SURGERIES     Social History   Social History  . Marital status: Single    Spouse name: N/A  . Number of children: N/A  . Years of education: N/A   Social History Main Topics  . Smoking status: Current Every Day Smoker    Years: 15.00    Last attempt to quit: 06/19/2011  . Smokeless tobacco: Never Used  . Alcohol use Yes     Comment: rarely  . Drug use: No  . Sexual activity: Not Asked   Other Topics Concern  . None   Social History Narrative  . None   Outpatient Encounter Prescriptions as of 12/24/2016  Medication Sig  . aspirin EC 81 MG tablet Take 81 mg by mouth daily.  Marland Kitchen atorvastatin (LIPITOR) 40 MG tablet Take 40 mg by mouth every morning.   . benazepril (LOTENSIN) 40 MG tablet Take 40 mg by mouth daily.  . hydrochlorothiazide (MICROZIDE) 12.5 MG capsule Take 12.5 mg by mouth daily.  . insulin aspart (NOVOLOG FLEXPEN) 100 UNIT/ML FlexPen Inject 8-14 Units into the skin 3 (three) times daily with meals.  . Insulin Glargine (LANTUS SOLOSTAR) 100 UNIT/ML Solostar Pen Inject 50 Units into the skin daily at 10 pm.  . Insulin Pen Needle (B-D ULTRAFINE III SHORT PEN) 31G X 8 MM MISC 1 each by Does not apply route as directed.  Marland Kitchen losartan (COZAAR) 100 MG tablet Take 100 mg by mouth daily.  . metFORMIN (GLUCOPHAGE) 1000 MG tablet Take 1,000 mg by mouth 2 (two) times daily with a meal.  . Potassium Chloride ER 20 MEQ TBCR Take 20 mEq by mouth daily.   . psyllium (HYDROCIL/METAMUCIL) 95 % PACK Take 1 packet by mouth  daily.  . sertraline (ZOLOFT) 100 MG tablet Take 100 mg by mouth daily.  . [DISCONTINUED] Insulin Glargine (LANTUS SOLOSTAR) 100 UNIT/ML Solostar Pen Inject 56 Units into the skin daily at 10 pm.  . [DISCONTINUED] predniSONE (DELTASONE) 50 MG tablet Take 1 tablet (50 mg total) by mouth daily.   No facility-administered encounter medications on file as of 12/24/2016.    ALLERGIES: No Known Allergies VACCINATION STATUS: Immunization History  Administered Date(s) Administered  . Influenza,inj,Quad PF,36+ Mos 03/02/2014    Diabetes  He presents for his follow-up diabetic visit. He has type 2 diabetes mellitus. Onset time: He was diagnosed at approximate age of 46 years. His disease course has been worsening. There are no hypoglycemic associated symptoms. Pertinent negatives for hypoglycemia include no confusion, headaches, pallor or seizures. There are no diabetic associated symptoms. Pertinent negatives for diabetes include no chest pain, no fatigue, no polydipsia, no polyphagia, no polyuria and no weakness. There are no hypoglycemic complications. Symptoms are worsening. There are no diabetic complications. Risk factors for coronary artery disease include dyslipidemia, diabetes mellitus, hypertension, sedentary lifestyle and male sex. Current diabetic treatment includes insulin injections and oral agent (monotherapy). He is compliant with treatment most of the time. His weight is stable. He is following a generally unhealthy diet. He  participates in exercise intermittently. Home blood sugar record trend: He came with loss of control with A1c of 8.9% increasing from 7.6%. His breakfast blood glucose range is generally 140-180 mg/dl. His overall blood glucose range is 140-180 mg/dl. An ACE inhibitor/angiotensin II receptor blocker is being taken.  Hyperlipidemia  This is a chronic problem. The current episode started more than 1 year ago. Pertinent negatives include no chest pain, myalgias or shortness  of breath. Current antihyperlipidemic treatment includes statins. Risk factors for coronary artery disease include diabetes mellitus, dyslipidemia, hypertension and male sex.  Hypertension  This is a chronic problem. The current episode started more than 1 year ago. Pertinent negatives include no chest pain, headaches, neck pain, palpitations or shortness of breath. Past treatments include ACE inhibitors.     Review of Systems  Constitutional: Negative for chills, fatigue, fever and unexpected weight change.  HENT: Negative for dental problem, mouth sores and trouble swallowing.   Eyes: Negative for visual disturbance.  Respiratory: Negative for cough, choking, chest tightness, shortness of breath and wheezing.   Cardiovascular: Negative for chest pain, palpitations and leg swelling.  Gastrointestinal: Negative for abdominal distention, abdominal pain, constipation, diarrhea, nausea and vomiting.  Endocrine: Negative for polydipsia, polyphagia and polyuria.  Genitourinary: Negative for dysuria, flank pain, hematuria and urgency.  Musculoskeletal: Negative for back pain, gait problem, myalgias and neck pain.  Skin: Negative for pallor, rash and wound.  Neurological: Negative for seizures, syncope, weakness, numbness and headaches.  Psychiatric/Behavioral: Negative.  Negative for confusion and dysphoric mood.    Objective:    BP (!) 161/90   Pulse 82   Ht 5\' 9"  (1.753 m)   Wt 182 lb (82.6 kg)   BMI 26.88 kg/m   Wt Readings from Last 3 Encounters:  12/24/16 182 lb (82.6 kg)  10/20/16 185 lb (83.9 kg)  09/03/16 183 lb (83 kg)    Physical Exam  Constitutional: He is oriented to person, place, and time. He appears well-developed and well-nourished. He is cooperative. No distress.  HENT:  Head: Normocephalic and atraumatic.  Eyes: EOM are normal.  Neck: Normal range of motion. Neck supple. No tracheal deviation present. No thyromegaly present.  Cardiovascular: Normal rate, S1 normal,  S2 normal and normal heart sounds.  Exam reveals no gallop.   No murmur heard. Pulses:      Dorsalis pedis pulses are 1+ on the right side, and 1+ on the left side.       Posterior tibial pulses are 1+ on the right side, and 1+ on the left side.  Pulmonary/Chest: Breath sounds normal. No respiratory distress. He has no wheezes.  Abdominal: Soft. Bowel sounds are normal. He exhibits no distension. There is no tenderness. There is no guarding and no CVA tenderness.  Musculoskeletal: He exhibits no edema.       Right shoulder: He exhibits no swelling and no deformity.  Neurological: He is alert and oriented to person, place, and time. He has normal strength and normal reflexes. No cranial nerve deficit or sensory deficit. Gait normal.  Skin: Skin is warm and dry. No rash noted. No cyanosis. Nails show no clubbing.  Psychiatric: He has a normal mood and affect. His speech is normal and behavior is normal. Judgment and thought content normal. Cognition and memory are normal.    Results for orders placed or performed in visit on 25/42/70  Basic metabolic panel  Result Value Ref Range   BUN 17 4 - 21   Creatinine 0.9 0.6 -  1.3  Hemoglobin A1c  Result Value Ref Range   Hemoglobin A1C 8.9    Complete Blood Count (Most recent): Lab Results  Component Value Date   WBC 14.8 (H) 06/08/2013   HGB 16.1 06/08/2013   HCT 44.3 06/08/2013   MCV 96.3 06/08/2013   PLT 187 06/08/2013   Chemistry (most recent): Lab Results  Component Value Date   NA 137 06/08/2013   K 3.5 (L) 06/08/2013   CL 95 (L) 06/08/2013   CO2 27 06/08/2013   BUN 17 12/17/2016   CREATININE 0.9 12/17/2016    Assessment & Plan:   1. Uncontrolled type 2 diabetes mellitus with complication, with long-term current use of insulin ([place])   - patient remains at a high risk for more acute and chronic complications of diabetes which include CAD, CVA, CKD, retinopathy, and neuropathy. These are all discussed in detail with the  patient.  Patient came with controlled fasting glucose profile, and  recent A1c of  7.6%, generally improving from 10.1 %.   - This is due to dietary indiscretion since his last visit.  Glucose logs and insulin administration records pertaining to this visit,  to be scanned into patient's records.  Recent labs reviewed.   - I have re-counseled the patient on diet management  by adopting a carbohydrate restricted / protein rich  Diet.  - Suggestion is made for patient to avoid simple carbohydrates   from his  diet including Cakes , Desserts, Ice Cream,  Soda (  diet and regular) , Sweet Tea , Candies,  Chips, Cookies, Artificial Sweeteners,   and "Sugar-free" Products .  This will help patient to have stable blood glucose profile and potentially avoid unintended  Weight gain.  - Patient is advised to stick to a routine mealtimes to eat 3 meals  a day and avoid unnecessary snacks ( to snack only to correct hypoglycemia).   - I have approached patient with the following individualized plan to manage diabetes and patient agrees.  -  Based on his current glycemic murder with A1c higher at 8.9%, he will need basal/bolus insulin to treat his diabetes without it. He is approached with this and he agrees. -  I will  lower her Lantus to 50 units daily at bedtime,   initiate NovoLog 8 units 3 times a day before meals for pre-meal blood glucose above 90 mg/dL (with additional correction for readings above 150 mg/dL) associated with monitoring of blood glucose 4 times a day-before meals and at bedtime.  -he promises to do better on his diet.  I will continue Metformin 1gm po   twice a day with breakfast and supper.   - Patient specific target  for A1c; LDL, HDL, Triglycerides, and  Waist Circumference were discussed in detail.  2) BP/HTN: Uncontrolled, he took his medications late today. Continue current medications including ACEI/ARB.  3) Lipids/HPL:  continue statins. 4)  Weight/Diet: CDE consult in  progress,  he has gained 10 pounds progressively since  March 2017 , exercise, and carbohydrates information provided.  5) Chronic Care/Health Maintenance:  -Patient is on ACEI/ARB and Statin medications and encouraged to continue to follow up with Ophthalmology, Podiatrist at least yearly or according to recommendations, and advised to quit (he has resumed heavy smoking)  smoking. I have recommended yearly flu vaccine and pneumonia vaccination at least every 5 years; moderate intensity exercise for up to 150 minutes weekly; and  sleep for at least 7 hours a day.  - 25 minutes  of time was spent on the care of this patient , 50% of which was applied for counseling on diabetes complications and their preventions.  - I advised patient to maintain close follow up with Dione Housekeeper, MD for primary care needs.  Patient is asked to bring meter and  blood glucose logs during his next visit.   Follow up plan: -Return in about 2 weeks (around 01/07/2017) for follow up with meter and logs- no labs.  Glade Lloyd, MD Phone: [phone]  Fax: [phone]   12/24/2016, 10:46 AM

## 2016-12-24 NOTE — Patient Instructions (Signed)

## 2017-01-14 ENCOUNTER — Encounter: Payer: Self-pay | Admitting: "Endocrinology

## 2017-01-14 ENCOUNTER — Ambulatory Visit (INDEPENDENT_AMBULATORY_CARE_PROVIDER_SITE_OTHER): Payer: BLUE CROSS/BLUE SHIELD | Admitting: "Endocrinology

## 2017-01-14 VITALS — BP 174/84 | HR 84 | Wt 185.0 lb

## 2017-01-14 DIAGNOSIS — E118 Type 2 diabetes mellitus with unspecified complications: Secondary | ICD-10-CM

## 2017-01-14 DIAGNOSIS — F17200 Nicotine dependence, unspecified, uncomplicated: Secondary | ICD-10-CM

## 2017-01-14 DIAGNOSIS — Z794 Long term (current) use of insulin: Secondary | ICD-10-CM

## 2017-01-14 DIAGNOSIS — I10 Essential (primary) hypertension: Secondary | ICD-10-CM

## 2017-01-14 DIAGNOSIS — E782 Mixed hyperlipidemia: Secondary | ICD-10-CM | POA: Diagnosis not present

## 2017-01-14 DIAGNOSIS — E1165 Type 2 diabetes mellitus with hyperglycemia: Secondary | ICD-10-CM | POA: Diagnosis not present

## 2017-01-14 DIAGNOSIS — IMO0002 Reserved for concepts with insufficient information to code with codable children: Secondary | ICD-10-CM

## 2017-01-14 MED ORDER — HYDROCHLOROTHIAZIDE 25 MG PO TABS: 25.0000 mg | ORAL_TABLET | Freq: Every day | ORAL | 2 refills | Status: DC

## 2017-01-14 MED ORDER — INSULIN ASPART 100 UNIT/ML FLEXPEN: 10.0000 [IU] | PEN_INJECTOR | Freq: Three times a day (TID) | SUBCUTANEOUS | 2 refills | Status: DC

## 2017-01-14 NOTE — Progress Notes (Signed)
Subjective:    Patient ID: Randy [name], male    DOB: Mar 19, 1951, PCP Dione Housekeeper, MD   Past Medical History:  Diagnosis Date  . Diabetes mellitus without complication ([place])   . Hyperlipidemia   . Hypertension    Past Surgical History:  Procedure Laterality Date  . HEMORRHOID SURGERY N/A 06/08/2013   Procedure: HEMORRHOIDECTOMY;  Surgeon: Leighton Ruff, MD;  Location: [facility];  Service: General;  Laterality: N/A;  . NO PAST SURGERIES     Social History   Social History  . Marital status: Single    Spouse name: N/A  . Number of children: N/A  . Years of education: N/A   Social History Main Topics  . Smoking status: Current Every Day Smoker    Years: 15.00    Last attempt to quit: 06/19/2011  . Smokeless tobacco: Never Used  . Alcohol use Yes     Comment: rarely  . Drug use: No  . Sexual activity: Not Asked   Other Topics Concern  . None   Social History Narrative  . None   Outpatient Encounter Prescriptions as of 01/14/2017  Medication Sig  . aspirin EC 81 MG tablet Take 81 mg by mouth daily.  Marland Kitchen atorvastatin (LIPITOR) 40 MG tablet Take 40 mg by mouth every morning.   . benazepril (LOTENSIN) 40 MG tablet Take 40 mg by mouth daily.  . hydrochlorothiazide (MICROZIDE) 12.5 MG capsule Take 12.5 mg by mouth daily.  . insulin aspart (NOVOLOG FLEXPEN) 100 UNIT/ML FlexPen Inject 10-16 Units into the skin 3 (three) times daily with meals.  . Insulin Glargine (LANTUS SOLOSTAR) 100 UNIT/ML Solostar Pen Inject 50 Units into the skin daily at 10 pm.  . Insulin Pen Needle (B-D ULTRAFINE III SHORT PEN) 31G X 8 MM MISC 1 each by Does not apply route as directed.  Marland Kitchen losartan (COZAAR) 100 MG tablet Take 100 mg by mouth daily.  . metFORMIN (GLUCOPHAGE) 1000 MG tablet Take 1,000 mg by mouth 2 (two) times daily with a meal.  . Potassium Chloride ER 20 MEQ TBCR Take 20 mEq by mouth daily.   . psyllium (HYDROCIL/METAMUCIL) 95 % PACK Take 1 packet by mouth  daily.  . sertraline (ZOLOFT) 100 MG tablet Take 100 mg by mouth daily.  . [DISCONTINUED] insulin aspart (NOVOLOG FLEXPEN) 100 UNIT/ML FlexPen Inject 8-14 Units into the skin 3 (three) times daily with meals.   No facility-administered encounter medications on file as of 01/14/2017.    ALLERGIES: No Known Allergies VACCINATION STATUS: Immunization History  Administered Date(s) Administered  . Influenza,inj,Quad PF,36+ Mos 03/02/2014    Diabetes  He presents for his follow-up diabetic visit. He has type 2 diabetes mellitus. Onset time: He was diagnosed at approximate age of 89 years. His disease course has been improving. There are no hypoglycemic associated symptoms. Pertinent negatives for hypoglycemia include no confusion, headaches, pallor or seizures. There are no diabetic associated symptoms. Pertinent negatives for diabetes include no chest pain, no fatigue, no polydipsia, no polyphagia, no polyuria and no weakness. There are no hypoglycemic complications. Symptoms are improving. There are no diabetic complications. Risk factors for coronary artery disease include dyslipidemia, diabetes mellitus, hypertension, sedentary lifestyle and male sex. Current diabetic treatment includes insulin injections and oral agent (monotherapy). He is compliant with treatment most of the time. His weight is stable. He is following a generally unhealthy diet. He participates in exercise intermittently. Home blood sugar record trend: He came with loss of control with  A1c of 8.9% increasing from 7.6%. His breakfast blood glucose range is generally 140-180 mg/dl. His lunch blood glucose range is generally 180-200 mg/dl. His dinner blood glucose range is generally 180-200 mg/dl. His bedtime blood glucose range is generally 180-200 mg/dl. His overall blood glucose range is 180-200 mg/dl. An ACE inhibitor/angiotensin II receptor blocker is being taken.  Hyperlipidemia  This is a chronic problem. The current episode  started more than 1 year ago. Pertinent negatives include no chest pain, myalgias or shortness of breath. Current antihyperlipidemic treatment includes statins. Risk factors for coronary artery disease include diabetes mellitus, dyslipidemia, hypertension and male sex.  Hypertension  This is a chronic problem. The current episode started more than 1 year ago. Pertinent negatives include no chest pain, headaches, neck pain, palpitations or shortness of breath. Past treatments include ACE inhibitors.     Review of Systems  Constitutional: Negative for chills, fatigue, fever and unexpected weight change.  HENT: Negative for dental problem, mouth sores and trouble swallowing.   Eyes: Negative for visual disturbance.  Respiratory: Negative for cough, choking, chest tightness, shortness of breath and wheezing.   Cardiovascular: Negative for chest pain, palpitations and leg swelling.  Gastrointestinal: Negative for abdominal distention, abdominal pain, constipation, diarrhea, nausea and vomiting.  Endocrine: Negative for polydipsia, polyphagia and polyuria.  Genitourinary: Negative for dysuria, flank pain, hematuria and urgency.  Musculoskeletal: Negative for back pain, gait problem, myalgias and neck pain.  Skin: Negative for pallor, rash and wound.  Neurological: Negative for seizures, syncope, weakness, numbness and headaches.  Psychiatric/Behavioral: Negative.  Negative for confusion and dysphoric mood.    Objective:    BP (!) 174/84   Pulse 84   Wt 185 lb (83.9 kg)   SpO2 94%   BMI 27.32 kg/m   Wt Readings from Last 3 Encounters:  01/14/17 185 lb (83.9 kg)  12/24/16 182 lb (82.6 kg)  10/20/16 185 lb (83.9 kg)    Physical Exam  Constitutional: He is oriented to person, place, and time. He appears well-developed and well-nourished. He is cooperative. No distress.  HENT:  Head: Normocephalic and atraumatic.  Eyes: EOM are normal.  Neck: Normal range of motion. Neck supple. No  tracheal deviation present. No thyromegaly present.  Cardiovascular: Normal rate, S1 normal, S2 normal and normal heart sounds.  Exam reveals no gallop.   No murmur heard. Pulses:      Dorsalis pedis pulses are 1+ on the right side, and 1+ on the left side.       Posterior tibial pulses are 1+ on the right side, and 1+ on the left side.  Pulmonary/Chest: Breath sounds normal. No respiratory distress. He has no wheezes.  Abdominal: Soft. Bowel sounds are normal. He exhibits no distension. There is no tenderness. There is no guarding and no CVA tenderness.  Musculoskeletal: He exhibits no edema.       Right shoulder: He exhibits no swelling and no deformity.  Neurological: He is alert and oriented to person, place, and time. He has normal strength and normal reflexes. No cranial nerve deficit or sensory deficit. Gait normal.  Skin: Skin is warm and dry. No rash noted. No cyanosis. Nails show no clubbing.  Psychiatric: He has a normal mood and affect. His speech is normal and behavior is normal. Judgment and thought content normal. Cognition and memory are normal.    Results for orders placed or performed in visit on 83/38/25  Basic metabolic panel  Result Value Ref Range   BUN 17  4 - 21   Creatinine 0.9 0.6 - 1.3  Hemoglobin A1c  Result Value Ref Range   Hemoglobin A1C 8.9    Complete Blood Count (Most recent): Lab Results  Component Value Date   WBC 14.8 (H) 06/08/2013   HGB 16.1 06/08/2013   HCT 44.3 06/08/2013   MCV 96.3 06/08/2013   PLT 187 06/08/2013   Chemistry (most recent): Lab Results  Component Value Date   NA 137 06/08/2013   K 3.5 (L) 06/08/2013   CL 95 (L) 06/08/2013   CO2 27 06/08/2013   BUN 17 12/17/2016   CREATININE 0.9 12/17/2016    Assessment & Plan:   1. Uncontrolled type 2 diabetes mellitus with complication, with long-term current use of insulin ([place])  - patient remains at a high risk for more acute and chronic complications of diabetes which include  CAD, CVA, CKD, retinopathy, and neuropathy. These are all discussed in detail with the patient.  Patient came with controlled fasting glucose profile.  Glucose logs and insulin administration records pertaining to this visit,  to be scanned into patient's records.  Recent labs reviewed.   - I have re-counseled the patient on diet management  by adopting a carbohydrate restricted / protein rich  Diet.  - Suggestion is made for patient to avoid simple carbohydrates   from his  diet including Cakes , Desserts, Ice Cream,  Soda (  diet and regular) , Sweet Tea , Candies,  Chips, Cookies, Artificial Sweeteners,   and "Sugar-free" Products .  This will help patient to have stable blood glucose profile and potentially avoid unintended  Weight gain.  - Patient is advised to stick to a routine mealtimes to eat 3 meals  a day and avoid unnecessary snacks ( to snack only to correct hypoglycemia).   - I have approached patient with the following individualized plan to manage diabetes and patient agrees.  -  Based on his recent glycemic murder with A1c higher at 8.9%, he will continue to need basal/bolus insulin to treat his diabetes without it.   -  I will  continue Lantus  50 units daily at bedtime,   increase NovoLog to 10  units 3 times a day before meals for pre-meal blood glucose above 90 mg/dL (with additional correction for readings above 150 mg/dL) associated with monitoring of blood glucose 4 times a day-before meals and at bedtime.  -he promises to do better on his diet.  I will continue Metformin 1gm po   twice a day with breakfast and supper.   - Patient specific target  for A1c; LDL, HDL, Triglycerides, and  Waist Circumference were discussed in detail.  2) BP/HTN: Uncontrolled. I will increase his hydrochlorothiazide to 25 mg by mouth daily. Continue current medications including ACEI/ARB.  3) Lipids/HPL:  continue statins. 4)  Weight/Diet: CDE consult in progress,  he has gained 10  pounds progressively since  March 2017 , exercise, and carbohydrates information provided.  5) Chronic Care/Health Maintenance:  -Patient is on ACEI/ARB and Statin medications and encouraged to continue to follow up with Ophthalmology, Podiatrist at least yearly or according to recommendations, and advised to quit (he has resumed heavy smoking)  smoking. I have recommended yearly flu vaccine and pneumonia vaccination at least every 5 years; moderate intensity exercise for up to 150 minutes weekly; and  sleep for at least 7 hours a day.  - 20 minutes of time was spent on the care of this patient , 50% of which was  applied for counseling on diabetes complications and their preventions.  - I advised patient to maintain close follow up with Dione Housekeeper, MD for primary care needs.  Patient is asked to bring meter and  blood glucose logs during his next visit.   Follow up plan: -Return in about 3 months (around 04/16/2017) for meter, and logs.  Glade Lloyd, MD Phone: [phone]  Fax: [phone]   01/14/2017, 11:34 AM

## 2017-04-07 ENCOUNTER — Ambulatory Visit (INDEPENDENT_AMBULATORY_CARE_PROVIDER_SITE_OTHER): Payer: BLUE CROSS/BLUE SHIELD | Admitting: *Deleted

## 2017-04-07 DIAGNOSIS — Z23 Encounter for immunization: Secondary | ICD-10-CM | POA: Diagnosis not present

## 2017-04-09 LAB — BASIC METABOLIC PANEL
BUN: 16 (ref 4–21)
Creatinine: 1 (ref <=1.3)

## 2017-04-09 LAB — HEMOGLOBIN A1C: Hemoglobin A1C: 6.9

## 2017-04-22 ENCOUNTER — Encounter: Payer: Self-pay | Admitting: "Endocrinology

## 2017-04-22 ENCOUNTER — Ambulatory Visit (INDEPENDENT_AMBULATORY_CARE_PROVIDER_SITE_OTHER): Payer: Medicare Other | Admitting: "Endocrinology

## 2017-04-22 VITALS — BP 166/89 | HR 80 | Ht 69.0 in | Wt 184.0 lb

## 2017-04-22 DIAGNOSIS — F17200 Nicotine dependence, unspecified, uncomplicated: Secondary | ICD-10-CM

## 2017-04-22 DIAGNOSIS — E1165 Type 2 diabetes mellitus with hyperglycemia: Secondary | ICD-10-CM

## 2017-04-22 DIAGNOSIS — E118 Type 2 diabetes mellitus with unspecified complications: Secondary | ICD-10-CM | POA: Diagnosis not present

## 2017-04-22 DIAGNOSIS — I10 Essential (primary) hypertension: Secondary | ICD-10-CM

## 2017-04-22 DIAGNOSIS — E782 Mixed hyperlipidemia: Secondary | ICD-10-CM

## 2017-04-22 DIAGNOSIS — Z794 Long term (current) use of insulin: Principal | ICD-10-CM

## 2017-04-22 DIAGNOSIS — IMO0002 Reserved for concepts with insufficient information to code with codable children: Secondary | ICD-10-CM

## 2017-04-22 MED ORDER — INSULIN GLARGINE 100 UNIT/ML SOLOSTAR PEN: 40.0000 [IU] | PEN_INJECTOR | Freq: Every day | SUBCUTANEOUS | 3 refills | Status: DC

## 2017-04-22 NOTE — Patient Instructions (Signed)

## 2017-04-22 NOTE — Progress Notes (Signed)
Subjective:    Patient ID: Randy [name], male    DOB: 05-15-1951, PCP Dione Housekeeper, MD   Past Medical History:  Diagnosis Date  . Diabetes mellitus without complication ([place])   . Hyperlipidemia   . Hypertension    Past Surgical History:  Procedure Laterality Date  . NO PAST SURGERIES     Social History   Socioeconomic History  . Marital status: Single    Spouse name: None  . Number of children: None  . Years of education: None  . Highest education level: None  Social Needs  . Financial resource strain: None  . Food insecurity - worry: None  . Food insecurity - inability: None  . Transportation needs - medical: None  . Transportation needs - non-medical: None  Occupational History  . None  Tobacco Use  . Smoking status: Current Every Day Smoker    Years: 15.00    Last attempt to quit: 06/19/2011    Years since quitting: 5.8  . Smokeless tobacco: Never Used  Substance and Sexual Activity  . Alcohol use: Yes    Comment: rarely  . Drug use: No  . Sexual activity: None  Other Topics Concern  . None  Social History Narrative  . None   Outpatient Encounter Medications as of 04/22/2017  Medication Sig  . aspirin EC 81 MG tablet Take 81 mg by mouth daily.  Marland Kitchen atorvastatin (LIPITOR) 40 MG tablet Take 40 mg by mouth every morning.   . benazepril (LOTENSIN) 40 MG tablet Take 40 mg by mouth daily.  . hydrochlorothiazide (HYDRODIURIL) 25 MG tablet Take 1 tablet (25 mg total) by mouth daily.  . insulin aspart (NOVOLOG FLEXPEN) 100 UNIT/ML FlexPen Inject 10-16 Units into the skin 3 (three) times daily with meals.  . Insulin Glargine (LANTUS SOLOSTAR) 100 UNIT/ML Solostar Pen Inject 40 Units daily at 10 pm into the skin.  . Insulin Pen Needle (B-D ULTRAFINE III SHORT PEN) 31G X 8 MM MISC 1 each by Does not apply route as directed.  Marland Kitchen losartan (COZAAR) 100 MG tablet Take 100 mg by mouth daily.  . metFORMIN (GLUCOPHAGE) 1000 MG tablet Take 1,000 mg by mouth 2 (two) times  daily with a meal.  . Potassium Chloride ER 20 MEQ TBCR Take 20 mEq by mouth daily.   . psyllium (HYDROCIL/METAMUCIL) 95 % PACK Take 1 packet by mouth daily.  . sertraline (ZOLOFT) 100 MG tablet Take 100 mg by mouth daily.  . [DISCONTINUED] Insulin Glargine (LANTUS SOLOSTAR) 100 UNIT/ML Solostar Pen Inject 50 Units into the skin daily at 10 pm.   No facility-administered encounter medications on file as of 04/22/2017.    ALLERGIES: No Known Allergies VACCINATION STATUS: Immunization History  Administered Date(s) Administered  . Influenza, High Dose Seasonal PF 04/07/2017  . Influenza,inj,Quad PF,6+ Mos 03/02/2014    Diabetes  He presents for his follow-up diabetic visit. He has type 2 diabetes mellitus. Onset time: He was diagnosed at approximate age of 69 years. His disease course has been improving. There are no hypoglycemic associated symptoms. Pertinent negatives for hypoglycemia include no confusion, headaches, pallor or seizures. There are no diabetic associated symptoms. Pertinent negatives for diabetes include no chest pain, no fatigue, no polydipsia, no polyphagia, no polyuria and no weakness. There are no hypoglycemic complications. Symptoms are improving. There are no diabetic complications. Risk factors for coronary artery disease include dyslipidemia, diabetes mellitus, hypertension, sedentary lifestyle and male sex. Current diabetic treatment includes insulin injections and oral agent (monotherapy). He  is compliant with treatment most of the time. His weight is stable. He is following a generally unhealthy diet. He participates in exercise intermittently. His breakfast blood glucose range is generally 140-180 mg/dl. His lunch blood glucose range is generally 140-180 mg/dl. His dinner blood glucose range is generally 140-180 mg/dl. His bedtime blood glucose range is generally 140-180 mg/dl. His overall blood glucose range is 140-180 mg/dl. An ACE inhibitor/angiotensin II receptor  blocker is being taken.  Hyperlipidemia  This is a chronic problem. The current episode started more than 1 year ago. Pertinent negatives include no chest pain, myalgias or shortness of breath. Current antihyperlipidemic treatment includes statins. Risk factors for coronary artery disease include diabetes mellitus, dyslipidemia, hypertension and male sex.  Hypertension  This is a chronic problem. The current episode started more than 1 year ago. Pertinent negatives include no chest pain, headaches, neck pain, palpitations or shortness of breath. Past treatments include ACE inhibitors.    Review of Systems  Constitutional: Negative for chills, fatigue, fever and unexpected weight change.  HENT: Negative for dental problem, mouth sores and trouble swallowing.   Eyes: Negative for visual disturbance.  Respiratory: Negative for cough, choking, chest tightness, shortness of breath and wheezing.   Cardiovascular: Negative for chest pain, palpitations and leg swelling.  Gastrointestinal: Negative for abdominal distention, abdominal pain, constipation, diarrhea, nausea and vomiting.  Endocrine: Negative for polydipsia, polyphagia and polyuria.  Genitourinary: Negative for dysuria, flank pain, hematuria and urgency.  Musculoskeletal: Negative for back pain, gait problem, myalgias and neck pain.  Skin: Negative for pallor, rash and wound.  Neurological: Negative for seizures, syncope, weakness, numbness and headaches.  Psychiatric/Behavioral: Negative.  Negative for confusion and dysphoric mood.    Objective:    BP (!) 166/89   Pulse 80   Ht 5\' 9"  (1.753 m)   Wt 184 lb (83.5 kg)   BMI 27.17 kg/m   Wt Readings from Last 3 Encounters:  04/22/17 184 lb (83.5 kg)  01/14/17 185 lb (83.9 kg)  12/24/16 182 lb (82.6 kg)    Physical Exam  Constitutional: He is oriented to person, place, and time. He appears well-developed and well-nourished. He is cooperative. No distress.  HENT:  Head:  Normocephalic and atraumatic.  Eyes: EOM are normal.  Neck: Normal range of motion. Neck supple. No tracheal deviation present. No thyromegaly present.  Cardiovascular: Normal rate, S1 normal, S2 normal and normal heart sounds. Exam reveals no gallop.  No murmur heard. Pulses:      Dorsalis pedis pulses are 1+ on the right side, and 1+ on the left side.       Posterior tibial pulses are 1+ on the right side, and 1+ on the left side.  Pulmonary/Chest: Breath sounds normal. No respiratory distress. He has no wheezes.  Abdominal: Soft. Bowel sounds are normal. He exhibits no distension. There is no tenderness. There is no guarding and no CVA tenderness.  Musculoskeletal: He exhibits no edema.       Right shoulder: He exhibits no swelling and no deformity.  Neurological: He is alert and oriented to person, place, and time. He has normal strength and normal reflexes. No cranial nerve deficit or sensory deficit. Gait normal.  Skin: Skin is warm and dry. No rash noted. No cyanosis. Nails show no clubbing.  Psychiatric: He has a normal mood and affect. His speech is normal and behavior is normal. Judgment and thought content normal. Cognition and memory are normal.    Results for orders placed or  performed in visit on 17/00/17  Basic metabolic panel  Result Value Ref Range   BUN 17 4 - 21   Creatinine 0.9 0.6 - 1.3  Hemoglobin A1c  Result Value Ref Range   Hemoglobin A1C 8.9    Complete Blood Count (Most recent): Lab Results  Component Value Date   WBC 14.8 (H) 06/08/2013   HGB 16.1 06/08/2013   HCT 44.3 06/08/2013   MCV 96.3 06/08/2013   PLT 187 06/08/2013   Chemistry (most recent): Lab Results  Component Value Date   NA 137 06/08/2013   K 3.5 (L) 06/08/2013   CL 95 (L) 06/08/2013   CO2 27 06/08/2013   BUN 17 12/17/2016   CREATININE 0.9 12/17/2016   A1c from 04/09/2017 was 6.8%.  Assessment & Plan:   1. Uncontrolled type 2 diabetes mellitus with complication, with long-term  current use of insulin ([place])  - He came with improved blood glucose profile and A1c at 6.8% from 8.9%, however , as a chronic heavy smoker he remains at extremely high risk for more acute and chronic complications of diabetes which include CAD, CVA, CKD, retinopathy, and neuropathy. These are all discussed in detail with the patient.  -Glucose logs and insulin administration records pertaining to this visit,  to be scanned into patient's records.  Recent labs reviewed.   - I have re-counseled the patient on diet management  by adopting a carbohydrate restricted / protein rich  Diet.  -  Suggestion is made for him to avoid simple carbohydrates  from his diet including Cakes, Sweet Desserts / Pastries, Ice Cream, Soda (diet and regular), Sweet Tea, Candies, Chips, Cookies, Store Bought Juices, Alcohol in Excess of  1-2 drinks a day, Artificial Sweeteners, and "Sugar-free" Products. This will help patient to have stable blood glucose profile and potentially avoid unintended weight gain.  - Patient is advised to stick to a routine mealtimes to eat 3 meals  a day and avoid unnecessary snacks ( to snack only to correct hypoglycemia).   - I have approached patient with the following individualized plan to manage diabetes and patient agrees.  -  he will continue to need basal/bolus insulin to maintain control of diabetes in target. -  I will  decreased his Lantus to 40 units daily at bedtime,  continue NovoLog  10  units 3 times a day before meals for pre-meal blood glucose above 90 mg/dL (with additional correction for readings above 150 mg/dL) associated with monitoring of blood glucose 4 times a day-before meals and at bedtime.  -he promises to do better on his diet.  - I will continue Metformin 1000 mg by mouth twice a day after breakfast and supper.    - Patient specific target  for A1c; LDL, HDL, Triglycerides, and  Waist Circumference were discussed in detail.  2) BP/HTN: Uncontrolled. I urged  him to be consistent taking his  hydrochlorothiazide  25 mg by mouth daily. Continue current medications including ACEI/ARB. He is a chronic heavy smoker.   3) Lipids/HPL:  continue statins. 4)  Weight/Diet: CDE consult in progress, he has a steady body weight recently , exercise, and carbohydrates information provided.  5) Chronic Care/Health Maintenance:  -Patient is on ACEI/ARB and Statin medications and encouraged to continue to follow up with Ophthalmology, Podiatrist at least yearly or according to recommendations, and advised to quit (he has resumed heavy smoking)  smoking. I have recommended yearly flu vaccine and pneumonia vaccination at least every 5 years; moderate intensity exercise  for up to 150 minutes weekly; and  sleep for at least 7 hours a day.  - I advised patient to maintain close follow up with Dione Housekeeper, MD for primary care needs.  - Time spent with the patient: 25 min, of which >50% was spent in reviewing his sugar logs , discussing his hypo- and hyper-glycemic episodes, reviewing his current and  previous labs and insulin doses and developing a plan to avoid hypo- and hyper-glycemia.    Follow up plan: -Return in about 3 months (around 07/23/2017) for follow up with pre-visit labs, meter, and logs.  Glade Lloyd, MD Phone: [phone]  Fax: [phone]   -  This note was partially dictated with voice recognition software. Similar sounding words can be transcribed inadequately or may not  be corrected upon review.  04/22/2017, 9:58 AM

## 2017-05-14 ENCOUNTER — Other Ambulatory Visit: Payer: Self-pay | Admitting: "Endocrinology

## 2017-07-23 LAB — BASIC METABOLIC PANEL
BUN: 17 (ref 4–21)
CREATININE: 1 (ref <=1.3)

## 2017-07-23 LAB — LIPID PANEL
Cholesterol: 139 (ref 0–200)
HDL: 37 (ref 35–70)
LDL CALC: 79
Triglycerides: 114 (ref 40–160)

## 2017-07-23 LAB — HEMOGLOBIN A1C: Hemoglobin A1C: 6.9

## 2017-07-29 ENCOUNTER — Ambulatory Visit (INDEPENDENT_AMBULATORY_CARE_PROVIDER_SITE_OTHER): Payer: Medicare Other | Admitting: "Endocrinology

## 2017-07-29 ENCOUNTER — Encounter: Payer: Self-pay | Admitting: "Endocrinology

## 2017-07-29 VITALS — BP 138/89 | HR 96 | Ht 69.0 in | Wt 185.0 lb

## 2017-07-29 DIAGNOSIS — E118 Type 2 diabetes mellitus with unspecified complications: Secondary | ICD-10-CM | POA: Diagnosis not present

## 2017-07-29 DIAGNOSIS — IMO0002 Reserved for concepts with insufficient information to code with codable children: Secondary | ICD-10-CM

## 2017-07-29 DIAGNOSIS — Z794 Long term (current) use of insulin: Secondary | ICD-10-CM

## 2017-07-29 DIAGNOSIS — F17200 Nicotine dependence, unspecified, uncomplicated: Secondary | ICD-10-CM | POA: Diagnosis not present

## 2017-07-29 DIAGNOSIS — E782 Mixed hyperlipidemia: Secondary | ICD-10-CM

## 2017-07-29 DIAGNOSIS — I10 Essential (primary) hypertension: Secondary | ICD-10-CM

## 2017-07-29 DIAGNOSIS — E1165 Type 2 diabetes mellitus with hyperglycemia: Secondary | ICD-10-CM

## 2017-07-29 MED ORDER — FREESTYLE LIBRE READER DEVI: 1.0000 | Freq: Once | 0 refills | Status: AC

## 2017-07-29 MED ORDER — FREESTYLE LIBRE SENSOR SYSTEM MISC: 2 refills | Status: DC

## 2017-07-29 MED ORDER — INSULIN LISPRO 100 UNIT/ML (KWIKPEN): 8.0000 [IU] | PEN_INJECTOR | Freq: Three times a day (TID) | SUBCUTANEOUS | 2 refills | Status: DC

## 2017-07-29 NOTE — Progress Notes (Signed)
Subjective:    Patient ID: Randy [name], male    DOB: 03-16-1951, PCP Dione Housekeeper, MD   Past Medical History:  Diagnosis Date  . Diabetes mellitus without complication ([place])   . Hyperlipidemia   . Hypertension    Past Surgical History:  Procedure Laterality Date  . HEMORRHOID SURGERY N/A 06/08/2013   Procedure: HEMORRHOIDECTOMY;  Surgeon: Leighton Ruff, MD;  Location: [facility];  Service: General;  Laterality: N/A;  . NO PAST SURGERIES     Social History   Socioeconomic History  . Marital status: Single    Spouse name: None  . Number of children: None  . Years of education: None  . Highest education level: None  Social Needs  . Financial resource strain: None  . Food insecurity - worry: None  . Food insecurity - inability: None  . Transportation needs - medical: None  . Transportation needs - non-medical: None  Occupational History  . None  Tobacco Use  . Smoking status: Current Every Day Smoker    Years: 15.00    Last attempt to quit: 06/19/2011    Years since quitting: 6.1  . Smokeless tobacco: Never Used  Substance and Sexual Activity  . Alcohol use: Yes    Comment: rarely  . Drug use: No  . Sexual activity: None  Other Topics Concern  . None  Social History Narrative  . None   Outpatient Encounter Medications as of 07/29/2017  Medication Sig  . insulin lispro (HUMALOG) 100 UNIT/ML KiwkPen Inject 0.08-0.14 mLs (8-14 Units total) into the skin 3 (three) times daily before meals.  . [DISCONTINUED] insulin lispro (HUMALOG) 100 UNIT/ML KiwkPen Inject 11-17 Units into the skin 3 (three) times daily before meals.  Marland Kitchen aspirin EC 81 MG tablet Take 81 mg by mouth daily.  Marland Kitchen atorvastatin (LIPITOR) 40 MG tablet Take 40 mg by mouth every morning.   . benazepril (LOTENSIN) 40 MG tablet Take 40 mg by mouth daily.  . Continuous Blood Gluc Receiver (FREESTYLE LIBRE READER) DEVI 1 Piece by Does not apply route once for 1 dose.  . Continuous Blood Gluc  Sensor (FREESTYLE LIBRE SENSOR SYSTEM) MISC Use one sensor every 10 days.  . hydrochlorothiazide (HYDRODIURIL) 25 MG tablet Take 1 tablet (25 mg total) by mouth daily.  . Insulin Glargine (LANTUS SOLOSTAR) 100 UNIT/ML Solostar Pen Inject 40 Units daily at 10 pm into the skin.  . Insulin Pen Needle (B-D ULTRAFINE III SHORT PEN) 31G X 8 MM MISC 1 each by Does not apply route as directed.  Marland Kitchen losartan (COZAAR) 100 MG tablet Take 100 mg by mouth daily.  . metFORMIN (GLUCOPHAGE) 1000 MG tablet Take 1,000 mg by mouth 2 (two) times daily with a meal.  . Potassium Chloride ER 20 MEQ TBCR Take 20 mEq by mouth daily.   . psyllium (HYDROCIL/METAMUCIL) 95 % PACK Take 1 packet by mouth daily.  . sertraline (ZOLOFT) 100 MG tablet Take 100 mg by mouth daily.  . [DISCONTINUED] NOVOLOG FLEXPEN 100 UNIT/ML FlexPen INJECT 8-14 UNITS SQ 3 TIMES DAILY WITH MEALS   No facility-administered encounter medications on file as of 07/29/2017.    ALLERGIES: No Known Allergies VACCINATION STATUS: Immunization History  Administered Date(s) Administered  . Influenza, High Dose Seasonal PF 04/07/2017  . Influenza,inj,Quad PF,6+ Mos 03/02/2014    Diabetes  He presents for his follow-up diabetic visit. He has type 2 diabetes mellitus. Onset time: He was diagnosed at approximate age of 63 years. His disease course has  been stable. There are no hypoglycemic associated symptoms. Pertinent negatives for hypoglycemia include no confusion, headaches, pallor or seizures. There are no diabetic associated symptoms. Pertinent negatives for diabetes include no chest pain, no fatigue, no polydipsia, no polyphagia, no polyuria and no weakness. There are no hypoglycemic complications. Symptoms are stable. There are no diabetic complications. Risk factors for coronary artery disease include dyslipidemia, diabetes mellitus, hypertension, sedentary lifestyle and male sex. Current diabetic treatment includes insulin injections and oral agent  (monotherapy). He is compliant with treatment most of the time. His weight is stable. He is following a generally unhealthy diet. He participates in exercise intermittently. His breakfast blood glucose range is generally 140-180 mg/dl. His lunch blood glucose range is generally 140-180 mg/dl. His dinner blood glucose range is generally 140-180 mg/dl. His bedtime blood glucose range is generally 140-180 mg/dl. His overall blood glucose range is 140-180 mg/dl. An ACE inhibitor/angiotensin II receptor blocker is being taken.  Hyperlipidemia  This is a chronic problem. The current episode started more than 1 year ago. Exacerbating diseases include diabetes and obesity. Pertinent negatives include no chest pain, myalgias or shortness of breath. Current antihyperlipidemic treatment includes statins. Compliance problems include adherence to diet.  Risk factors for coronary artery disease include diabetes mellitus, dyslipidemia, hypertension and male sex.  Hypertension  This is a chronic problem. The current episode started more than 1 year ago. Pertinent negatives include no chest pain, headaches, neck pain, palpitations or shortness of breath. Risk factors for coronary artery disease include dyslipidemia, diabetes mellitus, family history, male gender, sedentary lifestyle and smoking/tobacco exposure. Past treatments include ACE inhibitors. Compliance problems include diet and exercise.     Review of Systems  Constitutional: Negative for chills, fatigue, fever and unexpected weight change.  HENT: Negative for dental problem, mouth sores and trouble swallowing.   Eyes: Negative for visual disturbance.  Respiratory: Negative for cough, choking, chest tightness, shortness of breath and wheezing.   Cardiovascular: Negative for chest pain, palpitations and leg swelling.  Gastrointestinal: Negative for abdominal distention, abdominal pain, constipation, diarrhea, nausea and vomiting.  Endocrine: Negative for  polydipsia, polyphagia and polyuria.  Genitourinary: Negative for dysuria, flank pain, hematuria and urgency.  Musculoskeletal: Negative for back pain, gait problem, myalgias and neck pain.  Skin: Negative for pallor, rash and wound.  Neurological: Negative for seizures, syncope, weakness, numbness and headaches.  Psychiatric/Behavioral: Negative.  Negative for confusion and dysphoric mood.    Objective:    BP 138/89   Pulse 96   Ht 5\' 9"  (1.753 m)   BMI 27.17 kg/m   Wt Readings from Last 3 Encounters:  04/22/17 184 lb (83.5 kg)  01/14/17 185 lb (83.9 kg)  12/24/16 182 lb (82.6 kg)    Physical Exam  Constitutional: He is oriented to person, place, and time. He appears well-developed and well-nourished. He is cooperative. No distress.  HENT:  Head: Normocephalic and atraumatic.  Eyes: EOM are normal.  Neck: Normal range of motion. Neck supple. No tracheal deviation present. No thyromegaly present.  Cardiovascular: Normal rate, S1 normal, S2 normal and normal heart sounds. Exam reveals no gallop.  No murmur heard. Pulses:      Dorsalis pedis pulses are 1+ on the right side, and 1+ on the left side.       Posterior tibial pulses are 1+ on the right side, and 1+ on the left side.  Pulmonary/Chest: Breath sounds normal. No respiratory distress. He has no wheezes.  Abdominal: Soft. Bowel sounds are normal. He  exhibits no distension. There is no tenderness. There is no guarding and no CVA tenderness.  Musculoskeletal: He exhibits no edema.       Right shoulder: He exhibits no swelling and no deformity.  Neurological: He is alert and oriented to person, place, and time. He has normal strength and normal reflexes. No cranial nerve deficit or sensory deficit. Gait normal.  Skin: Skin is warm and dry. No rash noted. No cyanosis. Nails show no clubbing.  Psychiatric: He has a normal mood and affect. His speech is normal and behavior is normal. Judgment and thought content normal. Cognition  and memory are normal.    Results for orders placed or performed in visit on 48/18/56  Basic metabolic panel  Result Value Ref Range   BUN 17 4 - 21   Creatinine 1.0 0.6 - 1.3  Lipid panel  Result Value Ref Range   Triglycerides 114 40 - 160   Cholesterol 139 0 - 200   HDL 37 35 - 70   LDL Cholesterol 79   Hemoglobin A1c  Result Value Ref Range   Hemoglobin A1C 6.9    Complete Blood Count (Most recent): Lab Results  Component Value Date   WBC 14.8 (H) 06/08/2013   HGB 16.1 06/08/2013   HCT 44.3 06/08/2013   MCV 96.3 06/08/2013   PLT 187 06/08/2013   Chemistry (most recent): Lab Results  Component Value Date   NA 137 06/08/2013   K 3.5 (L) 06/08/2013   CL 95 (L) 06/08/2013   CO2 27 06/08/2013   BUN 17 07/23/2017   CREATININE 1.0 07/23/2017     Assessment & Plan:   1. Uncontrolled type 2 diabetes mellitus with complication, with long-term current use of insulin ([place])  - He came with stable blood glucose profile and A1c at 6.9%, overall improving from 10.1%.  -  as a chronic heavy smoker he remains at extremely high risk for more acute and chronic complications of diabetes which include CAD, CVA, CKD, retinopathy, and neuropathy. These are all discussed in detail with the patient.  -Glucose logs and insulin administration records pertaining to this visit,  to be scanned into patient's records.  Recent labs reviewed.   - I have re-counseled the patient on diet management  by adopting a carbohydrate restricted / protein rich  Diet.  -  Suggestion is made for him to avoid simple carbohydrates  from his diet including Cakes, Sweet Desserts / Pastries, Ice Cream, Soda (diet and regular), Sweet Tea, Candies, Chips, Cookies, Store Bought Juices, Alcohol in Excess of  1-2 drinks a day, Artificial Sweeteners, and "Sugar-free" Products. This will help patient to have stable blood glucose profile and potentially avoid unintended weight gain.   - Patient is advised to stick to  a routine mealtimes to eat 3 meals  a day and avoid unnecessary snacks ( to snack only to correct hypoglycemia).   - I have approached patient with the following individualized plan to manage diabetes and patient agrees.  -  He will continue to need basal/bolus insulin to maintain control of diabetes in target.  He has made significant errors of Humalog dosing, skipping when his blood sugar readings are between 90 and 150 mg/dL. -We discussed insulin dosing techniques 1 more time.  -  I will continue  Lantus 40 units daily at bedtime, lower his Humalog to 8  units 3 times a day before meals for pre-meal blood glucose above 90 mg/dL (with additional correction for readings above 150  mg/dL) associated with monitoring of blood glucose 4 times a day-before meals and at bedtime.  -he promises to do better on his diet.  - I will continue Metformin 1000 mg by mouth twice a day after breakfast and supper.    - Patient specific target  for A1c; LDL, HDL, Triglycerides, and  Waist Circumference were discussed in detail.  2) BP/HTN: His blood pressure is controlled to target.  I advised him to be  consistent taking his  hydrochlorothiazide  25 mg by mouth daily. Continue current medications including ACEI/ARB. He is a chronic heavy smoker.   3) Lipids/HPL: His lipid panel shows controlled with profile, I advised him to continue statins. 4)  Weight/Diet: CDE consult in progress, he has a steady body weight recently , exercise, and carbohydrates information provided.  5) Chronic Care/Health Maintenance:  -Patient is on ACEI/ARB and Statin medications and encouraged to continue to follow up with Ophthalmology, Podiatrist at least yearly or according to recommendations, and advised to quit working (he has resumed heavy smoking).  I have recommended yearly flu vaccine and pneumonia vaccination at least every 5 years; moderate intensity exercise for up to 150 minutes weekly; and  sleep for at least 7 hours a  day.  - I advised patient to maintain close follow up with Dione Housekeeper, MD for primary care needs.  - Time spent with the patient: 25 min, of which >50% was spent in reviewing his blood glucose logs , discussing his hypo- and hyper-glycemic episodes, reviewing his current and  previous labs and insulin doses and developing a plan to avoid hypo- and hyper-glycemia. Please refer to Patient Instructions for Blood Glucose Monitoring and Insulin/Medications Dosing Guide"  in media tab for additional information.   Follow up plan: -Return in about 4 months (around 11/26/2017) for meter, and logs, follow up with pre-visit labs, meter, and logs.  Glade Lloyd, MD Phone: [phone]  Fax: [phone]   -  This note was partially dictated with voice recognition software. Similar sounding words can be transcribed inadequately or may not  be corrected upon review.  07/29/2017, 8:51 AM

## 2017-07-29 NOTE — Patient Instructions (Signed)

## 2017-07-30 ENCOUNTER — Other Ambulatory Visit: Payer: Self-pay

## 2017-07-30 MED ORDER — INSULIN LISPRO 100 UNIT/ML (KWIKPEN): 17.0000 [IU] | PEN_INJECTOR | Freq: Three times a day (TID) | SUBCUTANEOUS | 2 refills | Status: DC

## 2017-07-30 MED ORDER — INSULIN LISPRO 100 UNIT/ML (KWIKPEN): 8.0000 [IU] | PEN_INJECTOR | Freq: Three times a day (TID) | SUBCUTANEOUS | 2 refills | Status: DC

## 2017-10-21 ENCOUNTER — Ambulatory Visit: Payer: Medicare Other | Admitting: Vascular Surgery

## 2017-10-21 ENCOUNTER — Encounter: Payer: Self-pay | Admitting: Vascular Surgery

## 2017-10-21 ENCOUNTER — Other Ambulatory Visit: Payer: Self-pay

## 2017-10-21 DIAGNOSIS — I70213 Atherosclerosis of native arteries of extremities with intermittent claudication, bilateral legs: Secondary | ICD-10-CM | POA: Diagnosis not present

## 2017-10-21 DIAGNOSIS — I70219 Atherosclerosis of native arteries of extremities with intermittent claudication, unspecified extremity: Secondary | ICD-10-CM | POA: Insufficient documentation

## 2017-10-21 MED ORDER — CILOSTAZOL 100 MG PO TABS: 100.0000 mg | ORAL_TABLET | Freq: Two times a day (BID) | ORAL | 11 refills | Status: DC

## 2017-10-21 NOTE — Progress Notes (Signed)
Requested by:  Cristal Deer, DPM [place], [place] [address]  Reason for consultation: likely BLE PAD   History of Present Illness   Randy [name] is a 67 y.o. (Dec 17, 1950) male with insulin dependent diabetes who presents with chief complaint: B calf pain.  Onset of symptoms occurred <1 year ago, without obvious trigger.  Pain is described as vague ache, severity 3-6/10, and associated with walking 100 yards.  Patient has attempted to treat this pain with rest.  His sx resolve rapidly after stopping ambulation.  The patient has no rest pain symptoms also and no leg wounds/ulcers.  Atherosclerotic risk factors include: HTN, HLD, IDDM and active smoking.  Past Medical History:  Diagnosis Date  . Diabetes mellitus without complication ([place])   . Hyperlipidemia   . Hypertension     Past Surgical History:  Procedure Laterality Date  . HEMORRHOID SURGERY N/A 06/08/2013   Procedure: HEMORRHOIDECTOMY;  Surgeon: Leighton Ruff, MD;  Location: [facility];  Service: General;  Laterality: N/A;  . NO PAST SURGERIES      Social History   Socioeconomic History  . Marital status: Single    Spouse name: Not on file  . Number of children: Not on file  . Years of education: Not on file  . Highest education level: Not on file  Occupational History  . Not on file  Social Needs  . Financial resource strain: Not on file  . Food insecurity:    Worry: Not on file    Inability: Not on file  . Transportation needs:    Medical: Not on file    Non-medical: Not on file  Tobacco Use  . Smoking status: Current Every Day Smoker    Years: 15.00    Last attempt to quit: 06/19/2011    Years since quitting: 6.3  . Smokeless tobacco: Never Used  Substance and Sexual Activity  . Alcohol use: Yes    Comment: rarely  . Drug use: No  . Sexual activity: Not on file  Lifestyle  . Physical activity:    Days per week: Not on file    Minutes per session: Not on file  .  Stress: Not on file  Relationships  . Social connections:    Talks on phone: Not on file    Gets together: Not on file    Attends religious service: Not on file    Active member of club or organization: Not on file    Attends meetings of clubs or organizations: Not on file    Relationship status: Not on file  . Intimate partner violence:    Fear of current or ex partner: Not on file    Emotionally abused: Not on file    Physically abused: Not on file    Forced sexual activity: Not on file  Other Topics Concern  . Not on file  Social History Narrative  . Not on file    Family History: patient is unable to detail the medical history of his parents   Current Outpatient Medications  Medication Sig Dispense Refill  . aspirin EC 81 MG tablet Take 81 mg by mouth daily.    Marland Kitchen atorvastatin (LIPITOR) 40 MG tablet Take 40 mg by mouth every morning.     . benazepril (LOTENSIN) 40 MG tablet Take 40 mg by mouth daily.    . Continuous Blood Gluc Sensor (FREESTYLE LIBRE SENSOR SYSTEM) MISC Use one sensor every 10 days. 3 each 2  .  hydrochlorothiazide (HYDRODIURIL) 25 MG tablet Take 1 tablet (25 mg total) by mouth daily. 30 tablet 2  . Insulin Glargine (LANTUS SOLOSTAR) 100 UNIT/ML Solostar Pen Inject 40 Units daily at 10 pm into the skin. 5 pen 3  . insulin lispro (HUMALOG) 100 UNIT/ML KiwkPen Inject 0.17 mLs (17 Units total) into the skin 3 (three) times daily before meals. 15 mL 2  . Insulin Pen Needle (B-D ULTRAFINE III SHORT PEN) 31G X 8 MM MISC 1 each by Does not apply route as directed. 150 each 3  . losartan (COZAAR) 100 MG tablet Take 100 mg by mouth daily.    . metFORMIN (GLUCOPHAGE) 1000 MG tablet Take 1,000 mg by mouth 2 (two) times daily with a meal.    . Potassium Chloride ER 20 MEQ TBCR Take 20 mEq by mouth daily.     . psyllium (HYDROCIL/METAMUCIL) 95 % PACK Take 1 packet by mouth daily.    . sertraline (ZOLOFT) 100 MG tablet Take 100 mg by mouth daily.    . cilostazol (PLETAL)  100 MG tablet Take 1 tablet (100 mg total) by mouth 2 (two) times daily before a meal. 60 tablet 11   No current facility-administered medications for this visit.     No Known Allergies  REVIEW OF SYSTEMS (negative unless checked):   Cardiac:  []  Chest pain or chest pressure? []  Shortness of breath upon activity? []  Shortness of breath when lying flat? []  Irregular heart rhythm?  Vascular:  [x]  Pain in calf, thigh, or hip brought on by walking? []  Pain in feet at night that wakes you up from your sleep? []  Blood clot in your veins? []  Leg swelling?  Pulmonary:  []  Oxygen at home? []  Productive cough? []  Wheezing?  Neurologic:  []  Sudden weakness in arms or legs? []  Sudden numbness in arms or legs? []  Sudden onset of difficult speaking or slurred speech? []  Temporary loss of vision in one eye? []  Problems with dizziness?  Gastrointestinal:  []  Blood in stool? []  Vomited blood?  Genitourinary:  []  Burning when urinating? []  Blood in urine?  Psychiatric:  []  Major depression  Hematologic:  []  Bleeding problems? []  Problems with blood clotting?  Dermatologic:  []  Rashes or ulcers?  Constitutional:  []  Fever or chills?  Ear/Nose/Throat:  []  Change in hearing? []  Nose bleeds? []  Sore throat?  Musculoskeletal:  []  Back pain? [x]  Joint pain? []  Muscle pain?   For VQI Use Only   PRE-ADM LIVING Home  AMB STATUS Ambulatory  CAD Sx None  PRIOR CHF None  STRESS TEST No   Physical Examination     Vitals:   10/21/17 0935 10/21/17 0939  BP: (!) 177/91 (!) 175/88  Pulse: 87 87  Resp: 16   Temp: (!) 96.6 F (35.9 C)   TempSrc: Oral   SpO2: 97%   Weight: 175 lb (79.4 kg)   Height: 5\' 9"  (1.753 m)    Body mass index is 25.84 kg/m.  General Alert, O x 3, WD, NAD  Head [place]/AT,    Ear/Nose/ Throat Hearing grossly intact, nares without erythema or drainage, oropharynx without Erythema or Exudate, Mallampati score: 3,   Eyes PERRLA, EOMI,    Neck  Supple, mid-line trachea,    Pulmonary Sym exp, good B air movt, CTA B  Cardiac RRR, Nl S1, S2, no Murmurs, No rubs, No S3,S4  Vascular Vessel Right Left  Radial Palpable Palpable  Brachial Palpable Palpable  Carotid Palpable, No Bruit Palpable, No Bruit  Aorta Not palpable N/A  Femoral Palpable Palpable  Popliteal Not palpable Not palpable  PT Faintly palpable Faintly palpable  DP Faintly palpable Not palpable    Gastro- intestinal soft, non-distended, non-tender to palpation, No guarding or rebound, no HSM, no masses, no CVAT B, No palpable prominent aortic pulse,    Musculo- skeletal M/S 5/5 throughout  , Extremities without ischemic changes  , No edema present, No visible varicosities , No Lipodermatosclerosis present  Neurologic Cranial nerves 2-12 intact , Pain and light touch intact in extremities , Motor exam as listed above  Psychiatric Judgement intact, Mood & affect appropriate for pt's clinical situation  Dermatologic See M/S exam for extremity exam, No rashes otherwise noted  Lymphatic  Palpable lymph nodes: None    Non-Invasive Vascular imaging   Outside BLE Physiologic Study (08/13/17)  R:   ABI: 0.76,   PT: bi  DP: mono  TBI:  0.61  L:   ABI: 0.89,   PT: bi  DP: mono  TBI: 0.65   Outside Studies/Documentation   4 pages of outside documents were reviewed including: podiatry chart and outside physiologic study.    Medical Decision Making   Roczen Waymire is a 67 y.o. male who presents with: BLE non-lifestyle or occupation limiting intermittent claudication.   Based on this patient's history and physical exam, I recommend: maximal medical mgmt, repeat ABI in 6 months  I discussed with the patient the natural history of intermittent claudication: 75% of patients have stable or improved symptoms in a year an only 2% require amputation. Eventually 20% may require intervention in a year.  I discussed in depth with the patient the nature of  atherosclerosis, and emphasized the importance of maximal medical management including strict control of blood pressure, blood glucose, and lipid levels, antiplatelet agent, obtaining regular exercise, and cessation of smoking.    The patient is aware that without maximal medical management the underlying atherosclerotic disease process will progress, limiting the benefit of any interventions.  I discussed in depth with the patient a walking plan and how to execute such.  The patient is interested in starting Pletal.  Pletal 100 mg 1 PO BID prescribed.  The patient is currently on a statin: Lipitor.   The patient is currently on an anti-platelet: ASA.  Thank you for allowing [place] to participate in this patient's care.   Adele Barthel, MD, FACS [facility] Office: [phone] Pager: [phone]  10/21/2017, 10:06 AM

## 2017-10-22 ENCOUNTER — Other Ambulatory Visit: Payer: Self-pay

## 2017-10-22 DIAGNOSIS — I70213 Atherosclerosis of native arteries of extremities with intermittent claudication, bilateral legs: Secondary | ICD-10-CM

## 2017-11-11 LAB — HEMOGLOBIN A1C: Hemoglobin A1C: 6.5

## 2017-11-11 LAB — LIPID PANEL
CHOLESTEROL: 132 (ref 0–200)
HDL: 39 (ref 35–70)
LDL Cholesterol: 70
TRIGLYCERIDES: 113 (ref 40–160)

## 2017-11-11 LAB — BASIC METABOLIC PANEL
BUN: 20 (ref 4–21)
Creatinine: 1.1 (ref <=1.3)

## 2017-12-02 ENCOUNTER — Encounter: Payer: Self-pay | Admitting: "Endocrinology

## 2017-12-02 ENCOUNTER — Ambulatory Visit (INDEPENDENT_AMBULATORY_CARE_PROVIDER_SITE_OTHER): Payer: Medicare Other | Admitting: "Endocrinology

## 2017-12-02 VITALS — BP 148/84 | HR 97 | Ht 69.0 in | Wt 182.0 lb

## 2017-12-02 DIAGNOSIS — E1165 Type 2 diabetes mellitus with hyperglycemia: Secondary | ICD-10-CM

## 2017-12-02 DIAGNOSIS — Z794 Long term (current) use of insulin: Secondary | ICD-10-CM

## 2017-12-02 DIAGNOSIS — I10 Essential (primary) hypertension: Secondary | ICD-10-CM

## 2017-12-02 DIAGNOSIS — F17200 Nicotine dependence, unspecified, uncomplicated: Secondary | ICD-10-CM

## 2017-12-02 DIAGNOSIS — E782 Mixed hyperlipidemia: Secondary | ICD-10-CM

## 2017-12-02 DIAGNOSIS — E118 Type 2 diabetes mellitus with unspecified complications: Secondary | ICD-10-CM | POA: Diagnosis not present

## 2017-12-02 DIAGNOSIS — IMO0002 Reserved for concepts with insufficient information to code with codable children: Secondary | ICD-10-CM

## 2017-12-02 MED ORDER — INSULIN GLARGINE 100 UNIT/ML SOLOSTAR PEN: 50.0000 [IU] | PEN_INJECTOR | Freq: Every day | SUBCUTANEOUS | 3 refills | Status: DC

## 2017-12-02 NOTE — Progress Notes (Signed)
Subjective:    Patient ID: Randy [name], male    DOB: December 13, 1950, PCP Dione Housekeeper, MD   Past Medical History:  Diagnosis Date  . Diabetes mellitus without complication ([place])   . Hyperlipidemia   . Hypertension    Past Surgical History:  Procedure Laterality Date  . HEMORRHOID SURGERY N/A 06/08/2013   Procedure: HEMORRHOIDECTOMY;  Surgeon: Leighton Ruff, MD;  Location: [facility];  Service: General;  Laterality: N/A;  . NO PAST SURGERIES     Social History   Socioeconomic History  . Marital status: Single    Spouse name: Not on file  . Number of children: Not on file  . Years of education: Not on file  . Highest education level: Not on file  Occupational History  . Not on file  Social Needs  . Financial resource strain: Not on file  . Food insecurity:    Worry: Not on file    Inability: Not on file  . Transportation needs:    Medical: Not on file    Non-medical: Not on file  Tobacco Use  . Smoking status: Current Every Day Smoker    Years: 15.00    Last attempt to quit: 06/19/2011    Years since quitting: 6.4  . Smokeless tobacco: Never Used  Substance and Sexual Activity  . Alcohol use: Yes    Comment: rarely  . Drug use: No  . Sexual activity: Not on file  Lifestyle  . Physical activity:    Days per week: Not on file    Minutes per session: Not on file  . Stress: Not on file  Relationships  . Social connections:    Talks on phone: Not on file    Gets together: Not on file    Attends religious service: Not on file    Active member of club or organization: Not on file    Attends meetings of clubs or organizations: Not on file    Relationship status: Not on file  Other Topics Concern  . Not on file  Social History Narrative  . Not on file   Outpatient Encounter Medications as of 12/02/2017  Medication Sig  . aspirin EC 81 MG tablet Take 81 mg by mouth daily.  Marland Kitchen atorvastatin (LIPITOR) 40 MG tablet Take 40 mg by mouth every  morning.   . benazepril (LOTENSIN) 40 MG tablet Take 40 mg by mouth daily.  . cilostazol (PLETAL) 100 MG tablet Take 1 tablet (100 mg total) by mouth 2 (two) times daily before a meal.  . Continuous Blood Gluc Sensor ([place]) MISC Use one sensor every 10 days.  . hydrochlorothiazide (HYDRODIURIL) 25 MG tablet Take 1 tablet (25 mg total) by mouth daily.  . Insulin Glargine (LANTUS SOLOSTAR) 100 UNIT/ML Solostar Pen Inject 50 Units into the skin daily at 10 pm.  . Insulin Pen Needle (B-D ULTRAFINE III SHORT PEN) 31G X 8 MM MISC 1 each by Does not apply route as directed.  Marland Kitchen losartan (COZAAR) 100 MG tablet Take 100 mg by mouth daily.  . metFORMIN (GLUCOPHAGE) 1000 MG tablet Take 1,000 mg by mouth 2 (two) times daily with a meal.  . Potassium Chloride ER 20 MEQ TBCR Take 20 mEq by mouth daily.   . psyllium (HYDROCIL/METAMUCIL) 95 % PACK Take 1 packet by mouth daily.  . sertraline (ZOLOFT) 100 MG tablet Take 100 mg by mouth daily.  . [DISCONTINUED] Insulin Glargine (LANTUS SOLOSTAR) 100 UNIT/ML Solostar Pen Inject 40  Units daily at 10 pm into the skin.  . [DISCONTINUED] insulin lispro (HUMALOG) 100 UNIT/ML KiwkPen Inject 0.17 mLs (17 Units total) into the skin 3 (three) times daily before meals.   No facility-administered encounter medications on file as of 12/02/2017.    ALLERGIES: No Known Allergies VACCINATION STATUS: Immunization History  Administered Date(s) Administered  . Influenza, High Dose Seasonal PF 04/07/2017  . Influenza,inj,Quad PF,6+ Mos 03/02/2014    Diabetes  He presents for his follow-up diabetic visit. He has type 2 diabetes mellitus. Onset time: He was diagnosed at approximate age of 67 years. His disease course has been improving. There are no hypoglycemic associated symptoms. Pertinent negatives for hypoglycemia include no confusion, headaches, pallor or seizures. There are no diabetic associated symptoms. Pertinent negatives for diabetes include no  chest pain, no fatigue, no polydipsia, no polyphagia, no polyuria and no weakness. There are no hypoglycemic complications. Symptoms are improving. There are no diabetic complications. Risk factors for coronary artery disease include dyslipidemia, diabetes mellitus, hypertension, sedentary lifestyle and male sex. Current diabetic treatment includes insulin injections and oral agent (monotherapy). He is compliant with treatment most of the time. His weight is stable. He is following a generally unhealthy diet. He participates in exercise intermittently. His breakfast blood glucose range is generally 140-180 mg/dl. His lunch blood glucose range is generally 140-180 mg/dl. His dinner blood glucose range is generally 140-180 mg/dl. His bedtime blood glucose range is generally 140-180 mg/dl. His overall blood glucose range is 140-180 mg/dl. An ACE inhibitor/angiotensin II receptor blocker is being taken.  Hyperlipidemia  This is a chronic problem. The current episode started more than 1 year ago. Exacerbating diseases include diabetes and obesity. Pertinent negatives include no chest pain, myalgias or shortness of breath. Current antihyperlipidemic treatment includes statins. Compliance problems include adherence to diet.  Risk factors for coronary artery disease include diabetes mellitus, dyslipidemia, hypertension and male sex.  Hypertension  This is a chronic problem. The current episode started more than 1 year ago. Pertinent negatives include no chest pain, headaches, neck pain, palpitations or shortness of breath. Risk factors for coronary artery disease include dyslipidemia, diabetes mellitus, family history, male gender, sedentary lifestyle and smoking/tobacco exposure. Past treatments include ACE inhibitors. Compliance problems include diet and exercise.     Review of Systems  Constitutional: Negative for chills, fatigue, fever and unexpected weight change.  HENT: Negative for dental problem, mouth  sores and trouble swallowing.   Eyes: Negative for visual disturbance.  Respiratory: Negative for cough, choking, chest tightness, shortness of breath and wheezing.   Cardiovascular: Negative for chest pain, palpitations and leg swelling.  Gastrointestinal: Negative for abdominal distention, abdominal pain, constipation, diarrhea, nausea and vomiting.  Endocrine: Negative for polydipsia, polyphagia and polyuria.  Genitourinary: Negative for dysuria, flank pain, hematuria and urgency.  Musculoskeletal: Negative for back pain, gait problem, myalgias and neck pain.  Skin: Negative for pallor, rash and wound.  Neurological: Negative for seizures, syncope, weakness, numbness and headaches.  Psychiatric/Behavioral: Negative.  Negative for confusion and dysphoric mood.    Objective:    BP (!) 148/84   Pulse 97   Ht 5\' 9"  (1.753 m)   Wt 182 lb (82.6 kg)   BMI 26.88 kg/m   Wt Readings from Last 3 Encounters:  12/02/17 182 lb (82.6 kg)  10/21/17 175 lb (79.4 kg)  07/29/17 185 lb (83.9 kg)    Physical Exam  Constitutional: He is oriented to person, place, and time. He appears well-developed. He is cooperative.  No distress.  HENT:  Head: Normocephalic and atraumatic.  Eyes: EOM are normal.  Neck: Normal range of motion. Neck supple. No tracheal deviation present. No thyromegaly present.  Cardiovascular: Normal rate, S1 normal and S2 normal. Exam reveals no gallop.  No murmur heard. Pulses:      Dorsalis pedis pulses are 1+ on the right side, and 1+ on the left side.       Posterior tibial pulses are 1+ on the right side, and 1+ on the left side.  Pulmonary/Chest: Effort normal. No respiratory distress. He has no wheezes.  Abdominal: He exhibits no distension. There is no tenderness. There is no guarding and no CVA tenderness.  Musculoskeletal: He exhibits no edema.       Right shoulder: He exhibits no swelling and no deformity.  Neurological: He is alert and oriented to person, place,  and time. He has normal strength. No cranial nerve deficit or sensory deficit. Gait normal.  Skin: Skin is warm and dry. No rash noted. No cyanosis. Nails show no clubbing.  Psychiatric: He has a normal mood and affect. His speech is normal. Judgment normal. Cognition and memory are normal.    Results for orders placed or performed in visit on 92/42/68  Basic metabolic panel  Result Value Ref Range   BUN 20 4 - 21   Creatinine 1.1 0.6 - 1.3  Lipid panel  Result Value Ref Range   Triglycerides 113 40 - 160   Cholesterol 132 0 - 200   HDL 39 35 - 70   LDL Cholesterol 70   Hemoglobin A1c  Result Value Ref Range   Hemoglobin A1C 6.5    Complete Blood Count (Most recent): Lab Results  Component Value Date   WBC 14.8 (H) 06/08/2013   HGB 16.1 06/08/2013   HCT 44.3 06/08/2013   MCV 96.3 06/08/2013   PLT 187 06/08/2013   Chemistry (most recent): Lab Results  Component Value Date   NA 137 06/08/2013   K 3.5 (L) 06/08/2013   CL 95 (L) 06/08/2013   CO2 27 06/08/2013   BUN 20 11/11/2017   CREATININE 1.1 11/11/2017   Recent Results (from the past 2160 hour(s))  Basic metabolic panel     Status: None   Collection Time: 11/11/17 12:00 AM  Result Value Ref Range   BUN 20 4 - 21   Creatinine 1.1 0.6 - 1.3  Lipid panel     Status: None   Collection Time: 11/11/17 12:00 AM  Result Value Ref Range   Triglycerides 113 40 - 160   Cholesterol 132 0 - 200   HDL 39 35 - 70   LDL Cholesterol 70   Hemoglobin A1c     Status: None   Collection Time: 11/11/17 12:00 AM  Result Value Ref Range   Hemoglobin A1C 6.5      Assessment & Plan:   1. Uncontrolled type 2 diabetes mellitus with complication, with long-term current use of insulin ([place])  - He came with continued improvement in his glycemic profile, A1c at 6.5%,  overall improving from 10.1%.  -  as a chronic heavy smoker he remains at extremely high risk for more acute and chronic complications of diabetes which include CAD, CVA,  CKD, retinopathy, and neuropathy. These are all discussed in detail with the patient.  -Glucose logs and insulin administration records pertaining to this visit,  to be scanned into patient's records.  Recent labs reviewed.   - I have re-counseled the patient  on diet management  by adopting a carbohydrate restricted / protein rich  Diet.  -  Suggestion is made for him to avoid simple carbohydrates  from his diet including Cakes, Sweet Desserts / Pastries, Ice Cream, Soda (diet and regular), Sweet Tea, Candies, Chips, Cookies, Store Bought Juices, Alcohol in Excess of  1-2 drinks a day, Artificial Sweeteners, and "Sugar-free" Products. This will help patient to have stable blood glucose profile and potentially avoid unintended weight gain.  - Patient is advised to stick to a routine mealtimes to eat 3 meals  a day and avoid unnecessary snacks ( to snack only to correct hypoglycemia).   - I have approached patient with the following individualized plan to manage diabetes and patient agrees.  -  He continue to make significant errors on Humalog dosing, skipping when his blood sugar readings are between 90 and 150 mg/dL.  -At this point, he may benefit from simplified treatment program. -I advised him to increase his Lantus to 50 units daily at bedtime, hold Humalog for now, continue to monitor blood glucose 2 times a day-before breakfast and at bedtime, and as needed.  -he promises to do better on his diet.  - I will continue Metformin 1000 mg by mouth twice a day after breakfast and supper.    - Patient specific target  for A1c; LDL, HDL, Triglycerides, and  Waist Circumference were discussed in detail.  2) BP/HTN: His blood pressure is not controlled to target.   I advised him to be  consistent taking his  hydrochlorothiazide  25 mg by mouth daily. Continue current medications including losartan 100 mg p.o. daily.  He is a chronic heavy smoker.   3) Lipids/HPL: His recent lipid panel showed  LDL controlled at 70.  He is advised to continue atorvastatin 40 grams p.o. nightly.   4)  Weight/Diet: CDE consult in progress, he has a steady body weight recently , exercise, and carbohydrates information provided.  5) Chronic Care/Health Maintenance:  -Patient is on ACEI/ARB and Statin medications and encouraged to continue to follow up with Ophthalmology, Podiatrist at least yearly or according to recommendations, and advised to quit smoking  (he has resumed heavy smoking).  I have recommended yearly flu vaccine and pneumonia vaccination at least every 5 years; moderate intensity exercise for up to 150 minutes weekly; and  sleep for at least 7 hours a day.  - I advised patient to maintain close follow up with Dione Housekeeper, MD for primary care needs.   - Time spent with the patient: 25 min, of which >50% was spent in reviewing his blood glucose logs , discussing his hypo- and hyper-glycemic episodes, reviewing his current and  previous labs and insulin doses and developing a plan to avoid hypo- and hyper-glycemia. Please refer to Patient Instructions for Blood Glucose Monitoring and Insulin/Medications Dosing Guide"  in media tab for additional information. Oliverio Minier participated in the discussions, expressed understanding, and voiced agreement with the above plans.  All questions were answered to his satisfaction. he is encouraged to contact clinic should he have any questions or concerns prior to his return visit.   Follow up plan: -Return in about 3 months (around 03/04/2018) for follow up with pre-visit labs, meter, and logs.  Glade Lloyd, MD Phone: [phone]  Fax: [phone]   -  This note was partially dictated with voice recognition software. Similar sounding words can be transcribed inadequately or may not  be corrected upon review.  12/02/2017, 1:48 PM

## 2017-12-02 NOTE — Patient Instructions (Signed)

## 2018-01-18 ENCOUNTER — Other Ambulatory Visit: Payer: Self-pay | Admitting: "Endocrinology

## 2018-01-27 ENCOUNTER — Telehealth: Payer: Self-pay | Admitting: "Endocrinology

## 2018-01-27 NOTE — Telephone Encounter (Signed)
Ok

## 2018-01-27 NOTE — Telephone Encounter (Signed)
Pt coming to pick up sample.

## 2018-01-27 NOTE — Telephone Encounter (Signed)
Randy [name] is calling stating that he is in the donut hole with his insurance and his Insulin Glargine (LANTUS SOLOSTAR) 100 UNIT/ML Solostar Pen  Is costing him $145.00 and he is asking if we can help him with samples, please advise?

## 2018-02-09 ENCOUNTER — Telehealth: Payer: Self-pay | Admitting: "Endocrinology

## 2018-02-09 MED ORDER — INSULIN GLARGINE 100 UNIT/ML SOLOSTAR PEN: 50.0000 [IU] | PEN_INJECTOR | Freq: Every day | SUBCUTANEOUS | 3 refills | Status: DC

## 2018-02-09 NOTE — Telephone Encounter (Signed)
Rx sent 

## 2018-02-09 NOTE — Telephone Encounter (Signed)
Broc is asking for a refill on Insulin Glargine (LANTUS SOLOSTAR) 100 UNIT/ML Solostar Pen  Please advisee?

## 2018-02-10 ENCOUNTER — Telehealth: Payer: Self-pay

## 2018-02-10 NOTE — Telephone Encounter (Signed)
Pt states his copay for his lantus is going to be $245 due to be in the "doughnut hole". His appt is on 03-10-18. He will pick up a couple of samples today

## 2018-02-10 NOTE — Telephone Encounter (Signed)
Ok. Will discuss his options next visit.

## 2018-02-17 LAB — LIPID PANEL
Cholesterol: 136 (ref 0–200)
HDL: 45 (ref 35–70)
LDL Cholesterol: 69
Triglycerides: 112 (ref 40–160)

## 2018-02-17 LAB — HEMOGLOBIN A1C: HEMOGLOBIN A1C: 6.9

## 2018-02-17 LAB — BASIC METABOLIC PANEL
BUN: 17 (ref 4–21)
Creatinine: 0.9 (ref 0.6–1.3)
Potassium: 3.5 (ref 3.4–5.3)

## 2018-03-10 ENCOUNTER — Encounter: Payer: Self-pay | Admitting: "Endocrinology

## 2018-03-10 ENCOUNTER — Ambulatory Visit (INDEPENDENT_AMBULATORY_CARE_PROVIDER_SITE_OTHER): Payer: Medicare Other | Admitting: "Endocrinology

## 2018-03-10 VITALS — BP 150/90 | HR 91 | Ht 69.0 in | Wt 182.0 lb

## 2018-03-10 DIAGNOSIS — E782 Mixed hyperlipidemia: Secondary | ICD-10-CM

## 2018-03-10 DIAGNOSIS — I10 Essential (primary) hypertension: Secondary | ICD-10-CM

## 2018-03-10 DIAGNOSIS — Z794 Long term (current) use of insulin: Secondary | ICD-10-CM

## 2018-03-10 DIAGNOSIS — E1165 Type 2 diabetes mellitus with hyperglycemia: Secondary | ICD-10-CM

## 2018-03-10 DIAGNOSIS — E118 Type 2 diabetes mellitus with unspecified complications: Secondary | ICD-10-CM

## 2018-03-10 DIAGNOSIS — IMO0002 Reserved for concepts with insufficient information to code with codable children: Secondary | ICD-10-CM

## 2018-03-10 DIAGNOSIS — F17200 Nicotine dependence, unspecified, uncomplicated: Secondary | ICD-10-CM | POA: Diagnosis not present

## 2018-03-10 NOTE — Progress Notes (Signed)
Endocrinology follow-up note  Subjective:    Patient ID: Randy [name], male    DOB: [birth year], PCP Dione Housekeeper, MD   Past Medical History:  Diagnosis Date  . Diabetes mellitus without complication ([place])   . Hyperlipidemia   . Hypertension    Past Surgical History:  Procedure Laterality Date  . HEMORRHOID SURGERY N/A 06/08/2013   Procedure: HEMORRHOIDECTOMY;  Surgeon: Leighton Ruff, MD;  Location: [facility];  Service: General;  Laterality: N/A;  . NO PAST SURGERIES     Social History   Socioeconomic History  . Marital status: Single    Spouse name: Not on file  . Number of children: Not on file  . Years of education: Not on file  . Highest education level: Not on file  Occupational History  . Not on file  Social Needs  . Financial resource strain: Not on file  . Food insecurity:    Worry: Not on file    Inability: Not on file  . Transportation needs:    Medical: Not on file    Non-medical: Not on file  Tobacco Use  . Smoking status: Current Every Day Smoker    Years: 15.00    Last attempt to quit: 06/19/2011    Years since quitting: 6.7  . Smokeless tobacco: Never Used  Substance and Sexual Activity  . Alcohol use: Yes    Comment: rarely  . Drug use: No  . Sexual activity: Not on file  Lifestyle  . Physical activity:    Days per week: Not on file    Minutes per session: Not on file  . Stress: Not on file  Relationships  . Social connections:    Talks on phone: Not on file    Gets together: Not on file    Attends religious service: Not on file    Active member of club or organization: Not on file    Attends meetings of clubs or organizations: Not on file    Relationship status: Not on file  Other Topics Concern  . Not on file  Social History Narrative  . Not on file   Outpatient Encounter Medications as of 03/10/2018  Medication Sig  . aspirin EC 81 MG tablet Take 81 mg by mouth daily.  Marland Kitchen atorvastatin (LIPITOR) 40 MG tablet  Take 40 mg by mouth every morning.   . benazepril (LOTENSIN) 40 MG tablet Take 40 mg by mouth daily.  . cilostazol (PLETAL) 100 MG tablet Take 1 tablet (100 mg total) by mouth 2 (two) times daily before a meal.  . Continuous Blood Gluc Sensor ([place]) MISC Use one sensor every 10 days.  . hydrochlorothiazide (HYDRODIURIL) 25 MG tablet TAKE ONE (1) TABLET EACH DAY  . Insulin Glargine (LANTUS SOLOSTAR) 100 UNIT/ML Solostar Pen Inject 50 Units into the skin at bedtime.  . Insulin Pen Needle (B-D ULTRAFINE III SHORT PEN) 31G X 8 MM MISC 1 each by Does not apply route as directed.  Marland Kitchen losartan (COZAAR) 100 MG tablet Take 100 mg by mouth daily.  . metFORMIN (GLUCOPHAGE) 1000 MG tablet Take 1,000 mg by mouth 2 (two) times daily with a meal.  . Potassium Chloride ER 20 MEQ TBCR Take 20 mEq by mouth daily.   . psyllium (HYDROCIL/METAMUCIL) 95 % PACK Take 1 packet by mouth daily.  . sertraline (ZOLOFT) 100 MG tablet Take 100 mg by mouth daily.   No facility-administered encounter medications on file as of 03/10/2018.  ALLERGIES: No Known Allergies VACCINATION STATUS: Immunization History  Administered Date(s) Administered  . Influenza, High Dose Seasonal PF 04/07/2017  . Influenza,inj,Quad PF,6+ Mos 03/02/2014    Diabetes  He presents for his follow-up diabetic visit. He has type 2 diabetes mellitus. Onset time: He was diagnosed at approximate age of 72 years. His disease course has been stable. There are no hypoglycemic associated symptoms. Pertinent negatives for hypoglycemia include no confusion, headaches, pallor or seizures. There are no diabetic associated symptoms. Pertinent negatives for diabetes include no chest pain, no fatigue, no polydipsia, no polyphagia, no polyuria and no weakness. There are no hypoglycemic complications. Symptoms are stable. There are no diabetic complications. Risk factors for coronary artery disease include dyslipidemia, diabetes mellitus,  hypertension, sedentary lifestyle and male sex. Current diabetic treatment includes insulin injections and oral agent (monotherapy). He is compliant with treatment most of the time. His weight is fluctuating minimally. He is following a generally unhealthy diet. He rarely participates in exercise. His breakfast blood glucose range is generally 140-180 mg/dl. His bedtime blood glucose range is generally 140-180 mg/dl. His overall blood glucose range is 140-180 mg/dl. An ACE inhibitor/angiotensin II receptor blocker is being taken.  Hyperlipidemia  This is a chronic problem. The current episode started more than 1 year ago. Exacerbating diseases include diabetes and obesity. Pertinent negatives include no chest pain, myalgias or shortness of breath. Current antihyperlipidemic treatment includes statins. Compliance problems include adherence to diet.  Risk factors for coronary artery disease include diabetes mellitus, dyslipidemia, hypertension and male sex.  Hypertension  This is a chronic problem. The current episode started more than 1 year ago. Pertinent negatives include no chest pain, headaches, neck pain, palpitations or shortness of breath. Risk factors for coronary artery disease include dyslipidemia, diabetes mellitus, family history, male gender, sedentary lifestyle and smoking/tobacco exposure. Past treatments include ACE inhibitors. Compliance problems include diet and exercise.     Review of Systems  Constitutional: Negative for chills, fatigue, fever and unexpected weight change.  HENT: Negative for dental problem, mouth sores and trouble swallowing.   Eyes: Negative for visual disturbance.  Respiratory: Negative for cough, choking, chest tightness, shortness of breath and wheezing.   Cardiovascular: Negative for chest pain, palpitations and leg swelling.  Gastrointestinal: Negative for abdominal distention, abdominal pain, constipation, diarrhea, nausea and vomiting.  Endocrine: Negative  for polydipsia, polyphagia and polyuria.  Genitourinary: Negative for dysuria, flank pain, hematuria and urgency.  Musculoskeletal: Negative for back pain, gait problem, myalgias and neck pain.  Skin: Negative for pallor, rash and wound.  Neurological: Negative for seizures, syncope, weakness, numbness and headaches.  Psychiatric/Behavioral: Negative.  Negative for confusion and dysphoric mood.    Objective:    BP (!) 150/90   Pulse 91   Ht 5\' 9"  (1.753 m)   Wt 182 lb (82.6 kg)   BMI 26.88 kg/m   Wt Readings from Last 3 Encounters:  03/10/18 182 lb (82.6 kg)  12/02/17 182 lb (82.6 kg)  10/21/17 175 lb (79.4 kg)    Physical Exam  Constitutional: He is oriented to person, place, and time. He appears well-developed. He is cooperative. No distress.  HENT:  Head: Normocephalic and atraumatic.  Eyes: EOM are normal.  Neck: Normal range of motion. Neck supple. No tracheal deviation present. No thyromegaly present.  Cardiovascular: Normal rate, S1 normal and S2 normal. Exam reveals no gallop.  No murmur heard. Pulses:      Dorsalis pedis pulses are 1+ on the right side, and 1+  on the left side.       Posterior tibial pulses are 1+ on the right side, and 1+ on the left side.  Pulmonary/Chest: Effort normal. No respiratory distress. He has no wheezes.  Abdominal: He exhibits no distension. There is no tenderness. There is no guarding and no CVA tenderness.  Musculoskeletal: He exhibits no edema.       Right shoulder: He exhibits no swelling and no deformity.  Neurological: He is alert and oriented to person, place, and time. He has normal strength. No cranial nerve deficit or sensory deficit. Gait normal.  Skin: Skin is warm and dry. No rash noted. No cyanosis. Nails show no clubbing.  Psychiatric: He has a normal mood and affect. His speech is normal. Judgment normal. Cognition and memory are normal.    Results for orders placed or performed in visit on 40/81/44  Basic metabolic  panel  Result Value Ref Range   BUN 17 4 - 21   Creatinine 0.9 0.6 - 1.3   Potassium 3.5 3.4 - 5.3  Lipid panel  Result Value Ref Range   Triglycerides 112 40 - 160   Cholesterol 136 0 - 200   HDL 45 35 - 70   LDL Cholesterol 69   Hemoglobin A1c  Result Value Ref Range   Hemoglobin A1C 6.9    Complete Blood Count (Most recent): Lab Results  Component Value Date   WBC 14.8 (H) 06/08/2013   HGB 16.1 06/08/2013   HCT 44.3 06/08/2013   MCV 96.3 06/08/2013   PLT 187 06/08/2013   Chemistry (most recent): Lab Results  Component Value Date   NA 137 06/08/2013   K 3.5 02/17/2018   CL 95 (L) 06/08/2013   CO2 27 06/08/2013   BUN 17 02/17/2018   CREATININE 0.9 02/17/2018   Recent Results (from the past 2160 hour(s))  Basic metabolic panel     Status: None   Collection Time: 02/17/18 12:00 AM  Result Value Ref Range   BUN 17 4 - 21   Creatinine 0.9 0.6 - 1.3   Potassium 3.5 3.4 - 5.3  Lipid panel     Status: None   Collection Time: 02/17/18 12:00 AM  Result Value Ref Range   Triglycerides 112 40 - 160   Cholesterol 136 0 - 200   HDL 45 35 - 70   LDL Cholesterol 69   Hemoglobin A1c     Status: None   Collection Time: 02/17/18 12:00 AM  Result Value Ref Range   Hemoglobin A1C 6.9      Assessment & Plan:   1. Uncontrolled type 2 diabetes mellitus with complication, with long-term current use of insulin ([place])  -He returns with continued improvement in his glycemic profile and A1c of 6.9%,   overall improving from 10.1%.  -  as a chronic heavy smoker he remains at extremely high risk for more acute and chronic complications of diabetes which include CAD, CVA, CKD, retinopathy, and neuropathy. These are all discussed in detail with the patient.  -Glucose logs and insulin administration records pertaining to this visit,  to be scanned into patient's records.  Recent labs reviewed.   - I have re-counseled the patient on diet management  by adopting a carbohydrate  restricted / protein rich  Diet.  -  Suggestion is made for him to avoid simple carbohydrates  from his diet including Cakes, Sweet Desserts / Pastries, Ice Cream, Soda (diet and regular), Sweet Tea, Candies, Chips, Cookies, Store Bought  Juices, Alcohol in Excess of  1-2 drinks a day, Artificial Sweeteners, and "Sugar-free" Products. This will help patient to have stable blood glucose profile and potentially avoid unintended weight gain.   - Patient is advised to stick to a routine mealtimes to eat 3 meals  a day and avoid unnecessary snacks ( to snack only to correct hypoglycemia).   - I have approached patient with the following individualized plan to manage diabetes and patient agrees.  -Based on his presentation with near target glycemic profile and stable A1c of 6.9%, he will not require prandial insulin for now.    -He is advised to continue Lantus 50 units nightly, continue to hold Humalog for now.  He will continue to monitor blood glucose 2 times a day- daily before breakfast and at bedtime.    -he promises to do better on his diet.  -He is advised to continue Metformin 1000 mg by mouth twice a day after breakfast and supper.    - Patient specific target  for A1c; LDL, HDL, Triglycerides, and  Waist Circumference were discussed in detail.  2) BP/HTN: His blood pressure is not controlled to target.    I advised him to be  consistent taking his  hydrochlorothiazide  25 mg by mouth daily along with losartan 100 mg p.o. daily, he is extensively counseled against smoking.   3) Lipids/HPL: His recent lipid panel showed LDL controlled at 69.  He is advised to continue atorvastatin 40 mg p.o. nightly.     4)  Weight/Diet: CDE consult in progress, he has a steady body weight recently , exercise, and carbohydrates information provided.  5) Chronic Care/Health Maintenance:  -Patient is on ACEI/ARB and Statin medications and encouraged to continue to follow up with Ophthalmology, Podiatrist  at least yearly or according to recommendations, and advised to quit smoking  (he has resumed heavy smoking).  I have recommended yearly flu vaccine and pneumonia vaccination at least every 5 years; moderate intensity exercise for up to 150 minutes weekly; and  sleep for at least 7 hours a day.  - I advised patient to maintain close follow up with Dione Housekeeper, MD for primary care needs.   - Time spent with the patient: 25 min, of which >50% was spent in reviewing his blood glucose logs , discussing his hypo- and hyper-glycemic episodes, reviewing his current and  previous labs and insulin doses and developing a plan to avoid hypo- and hyper-glycemia. Please refer to Patient Instructions for Blood Glucose Monitoring and Insulin/Medications Dosing Guide"  in media tab for additional information. Nikia Teal participated in the discussions, expressed understanding, and voiced agreement with the above plans.  All questions were answered to his satisfaction. he is encouraged to contact clinic should he have any questions or concerns prior to his return visit.    Follow up plan: -Return in about 4 months (around 07/11/2018) for Follow up with Pre-visit Labs, Meter, and Logs.  Glade Lloyd, MD Phone: [phone]  Fax: [phone]   -  This note was partially dictated with voice recognition software. Similar sounding words can be transcribed inadequately or may not  be corrected upon review.  03/10/2018, 1:35 PM

## 2018-03-10 NOTE — Patient Instructions (Signed)

## 2018-04-22 ENCOUNTER — Encounter (HOSPITAL_COMMUNITY): Payer: Medicare Other

## 2018-04-22 ENCOUNTER — Ambulatory Visit: Payer: Medicare Other | Admitting: Family

## 2018-05-12 ENCOUNTER — Ambulatory Visit (INDEPENDENT_AMBULATORY_CARE_PROVIDER_SITE_OTHER): Payer: Medicare Other | Admitting: Physician Assistant

## 2018-05-12 ENCOUNTER — Ambulatory Visit (HOSPITAL_COMMUNITY)
Admission: RE | Admit: 2018-05-12 | Discharge: 2018-05-12 | Disposition: A | Discharge status: Home / Self Care | Payer: Medicare Other | Source: Ambulatory Visit | Attending: Vascular Surgery | Admitting: Vascular Surgery

## 2018-05-12 VITALS — BP 170/93 | HR 83 | Resp 16 | Ht 69.0 in | Wt 182.0 lb

## 2018-05-12 DIAGNOSIS — F17200 Nicotine dependence, unspecified, uncomplicated: Secondary | ICD-10-CM | POA: Diagnosis not present

## 2018-05-12 DIAGNOSIS — I70213 Atherosclerosis of native arteries of extremities with intermittent claudication, bilateral legs: Secondary | ICD-10-CM

## 2018-05-12 NOTE — Progress Notes (Signed)
Established Intermittent Claudication   History of Present Illness   Randy [name] is a 67 y.o. (December 19, 1950) male who presents to go over ABIs performed today.  He is followed for peripheral arterial disease.  He denies any change in claudication symptoms over the past 6 months.  He is still able to walk 50 to 100 yards before experiencing bilateral calf cramping.  This is relieved with rest.  He denies any rest pain or active tissue changes.  He is tolerating Pletal in addition to aspirin and statin regimen.  He continues to smoke daily.   The patient's PMH, PSH, SH, and FamHx were reviewed and are unchanged from prior visit  Current Outpatient Medications  Medication Sig Dispense Refill  . aspirin EC 81 MG tablet Take 81 mg by mouth daily.    Marland Kitchen atorvastatin (LIPITOR) 40 MG tablet Take 40 mg by mouth every morning.     . benazepril (LOTENSIN) 40 MG tablet Take 40 mg by mouth daily.    . cilostazol (PLETAL) 100 MG tablet Take 1 tablet (100 mg total) by mouth 2 (two) times daily before a meal. 60 tablet 11  . Continuous Blood Gluc Sensor (FREESTYLE LIBRE SENSOR SYSTEM) MISC Use one sensor every 10 days. 3 each 2  . hydrochlorothiazide (HYDRODIURIL) 25 MG tablet TAKE ONE (1) TABLET EACH DAY 90 tablet 0  . Insulin Glargine (LANTUS SOLOSTAR) 100 UNIT/ML Solostar Pen Inject 50 Units into the skin at bedtime. 15 mL 3  . Insulin Pen Needle (B-D ULTRAFINE III SHORT PEN) 31G X 8 MM MISC 1 each by Does not apply route as directed. 150 each 3  . losartan (COZAAR) 100 MG tablet Take 100 mg by mouth daily.    . metFORMIN (GLUCOPHAGE) 1000 MG tablet Take 1,000 mg by mouth 2 (two) times daily with a meal.    . Potassium Chloride ER 20 MEQ TBCR Take 20 mEq by mouth daily.     . psyllium (HYDROCIL/METAMUCIL) 95 % PACK Take 1 packet by mouth daily.    . sertraline (ZOLOFT) 100 MG tablet Take 100 mg by mouth daily.     No current facility-administered medications for this visit.     On ROS today: 10  system ROS is negative unless otherwise noted in HPI   Physical Examination   Vitals:   05/12/18 1336  BP: (!) 170/93  Pulse: 83  Resp: 16  SpO2: 94%  Weight: 182 lb (82.6 kg)  Height: 5\' 9"  (1.753 m)   Body mass index is 26.88 kg/m.  General Alert, O x 3, WD, NAD  Pulmonary Sym exp, good B air movt,  Cardiac RRR, Nl S1, S2,   Vascular Vessel Right Left  Radial Palpable Palpable  Aorta Not palpable N/A  Femoral Palpable Palpable  Popliteal Palpable Palpable  PT Not palpable Not palpable  DP Not palpable Not palpable    Gastro- intestinal soft, non-distended, non-tender to palpation, No guarding or rebound,   Musculo- skeletal M/S 5/5 throughout  , Extremities without ischemic changes  , No edema present, No visible varicosities , No Lipodermatosclerosis present  Neurologic A&O    Non-Invasive Vascular imaging   ABI (05/12/18)  R:   ABI: 0.61   PT: mono  DP: mono  TBI: 0.41  L:   ABI: 0.72   PT: mono  DP: bi  TBI: 0.48    Medical Decision Making   Randy [name] is a 67 y.o. male who presents with:  bilateral leg intermittent claudication  without evidence of critical limb ischemia.   No significant change in ABIs or claudication symptoms over the past 6 months  Encouraged walking program  Continue Pletal, aspirin, and statin  Encourage smoking cessation  Recheck ABIs in 1 year  Patient will return sooner/call the office if claudication distance shortens, rest pain develops, or he notices any tissue changes of his feet  Dagoberto Ligas PA-C [facility]: [phone]

## 2018-05-26 LAB — LIPID PANEL
Cholesterol: 145 (ref 0–200)
HDL: 49 (ref 35–70)
LDL Cholesterol: 75
Triglycerides: 107 (ref 40–160)

## 2018-05-26 LAB — HEMOGLOBIN A1C: Hemoglobin A1C: 7.7

## 2018-05-26 LAB — BASIC METABOLIC PANEL
BUN: 23 — AB (ref 4–21)
Creatinine: 1.2 (ref 0.6–1.3)

## 2018-07-07 ENCOUNTER — Ambulatory Visit (INDEPENDENT_AMBULATORY_CARE_PROVIDER_SITE_OTHER): Payer: Medicare Other | Admitting: "Endocrinology

## 2018-07-07 ENCOUNTER — Encounter: Payer: Self-pay | Admitting: "Endocrinology

## 2018-07-07 VITALS — BP 152/89 | HR 76 | Ht 69.0 in | Wt 182.0 lb

## 2018-07-07 DIAGNOSIS — I10 Essential (primary) hypertension: Secondary | ICD-10-CM

## 2018-07-07 DIAGNOSIS — E1165 Type 2 diabetes mellitus with hyperglycemia: Secondary | ICD-10-CM

## 2018-07-07 DIAGNOSIS — F17200 Nicotine dependence, unspecified, uncomplicated: Secondary | ICD-10-CM

## 2018-07-07 DIAGNOSIS — E118 Type 2 diabetes mellitus with unspecified complications: Secondary | ICD-10-CM

## 2018-07-07 DIAGNOSIS — E782 Mixed hyperlipidemia: Secondary | ICD-10-CM

## 2018-07-07 DIAGNOSIS — IMO0002 Reserved for concepts with insufficient information to code with codable children: Secondary | ICD-10-CM

## 2018-07-07 DIAGNOSIS — Z794 Long term (current) use of insulin: Secondary | ICD-10-CM

## 2018-07-07 MED ORDER — INSULIN GLARGINE 100 UNIT/ML SOLOSTAR PEN: 60.0000 [IU] | PEN_INJECTOR | Freq: Every day | SUBCUTANEOUS | 3 refills | Status: DC

## 2018-07-07 NOTE — Progress Notes (Signed)
Endocrinology follow-up note  Subjective:    Patient ID: Randy [name], male    DOB: 1950/06/30, PCP Dione Housekeeper, MD   Past Medical History:  Diagnosis Date  . Diabetes mellitus without complication ([place])   . Hyperlipidemia   . Hypertension    Past Surgical History:  Procedure Laterality Date  . HEMORRHOID SURGERY N/A 06/08/2013   Procedure: HEMORRHOIDECTOMY;  Surgeon: Leighton Ruff, MD;  Location: [facility];  Service: General;  Laterality: N/A;  . NO PAST SURGERIES     Social History   Socioeconomic History  . Marital status: Single    Spouse name: Not on file  . Number of children: Not on file  . Years of education: Not on file  . Highest education level: Not on file  Occupational History  . Not on file  Social Needs  . Financial resource strain: Not on file  . Food insecurity:    Worry: Not on file    Inability: Not on file  . Transportation needs:    Medical: Not on file    Non-medical: Not on file  Tobacco Use  . Smoking status: Current Every Day Smoker    Years: 15.00  . Smokeless tobacco: Never Used  Substance and Sexual Activity  . Alcohol use: Never    Frequency: Never    Comment: rarely  . Drug use: No  . Sexual activity: Not on file  Lifestyle  . Physical activity:    Days per week: Not on file    Minutes per session: Not on file  . Stress: Not on file  Relationships  . Social connections:    Talks on phone: Not on file    Gets together: Not on file    Attends religious service: Not on file    Active member of club or organization: Not on file    Attends meetings of clubs or organizations: Not on file    Relationship status: Not on file  Other Topics Concern  . Not on file  Social History Narrative  . Not on file   Outpatient Encounter Medications as of 07/07/2018  Medication Sig  . aspirin EC 81 MG tablet Take 81 mg by mouth daily.  Marland Kitchen atorvastatin (LIPITOR) 40 MG tablet Take 40 mg by mouth every morning.   .  benazepril (LOTENSIN) 40 MG tablet Take 40 mg by mouth daily.  . cilostazol (PLETAL) 100 MG tablet Take 1 tablet (100 mg total) by mouth 2 (two) times daily before a meal.  . hydrochlorothiazide (HYDRODIURIL) 25 MG tablet TAKE ONE (1) TABLET EACH DAY  . Insulin Glargine (LANTUS SOLOSTAR) 100 UNIT/ML Solostar Pen Inject 60 Units into the skin at bedtime.  . Insulin Pen Needle (B-D ULTRAFINE III SHORT PEN) 31G X 8 MM MISC 1 each by Does not apply route as directed.  Marland Kitchen losartan (COZAAR) 100 MG tablet Take 100 mg by mouth daily.  . metFORMIN (GLUCOPHAGE) 1000 MG tablet Take 1,000 mg by mouth 2 (two) times daily with a meal.  . Potassium Chloride ER 20 MEQ TBCR Take 20 mEq by mouth daily.   . psyllium (HYDROCIL/METAMUCIL) 95 % PACK Take 1 packet by mouth daily.  . sertraline (ZOLOFT) 100 MG tablet Take 100 mg by mouth daily.  . [DISCONTINUED] Continuous Blood Gluc Sensor (FREESTYLE LIBRE SENSOR SYSTEM) MISC Use one sensor every 10 days.  . [DISCONTINUED] Insulin Glargine (LANTUS SOLOSTAR) 100 UNIT/ML Solostar Pen Inject 50 Units into the skin at bedtime.   No facility-administered  encounter medications on file as of 07/07/2018.    ALLERGIES: No Known Allergies VACCINATION STATUS: Immunization History  Administered Date(s) Administered  . Influenza, High Dose Seasonal PF 04/07/2017  . Influenza,inj,Quad PF,6+ Mos 03/02/2014    Diabetes  He presents for his follow-up diabetic visit. He has type 2 diabetes mellitus. Onset time: He was diagnosed at approximate age of 60 years. His disease course has been worsening. There are no hypoglycemic associated symptoms. Pertinent negatives for hypoglycemia include no confusion, headaches, pallor or seizures. There are no diabetic associated symptoms. Pertinent negatives for diabetes include no chest pain, no fatigue, no polydipsia, no polyphagia, no polyuria and no weakness. There are no hypoglycemic complications. Symptoms are worsening. There are no diabetic  complications. Risk factors for coronary artery disease include dyslipidemia, diabetes mellitus, hypertension, sedentary lifestyle and male sex. Current diabetic treatment includes insulin injections and oral agent (monotherapy). He is compliant with treatment most of the time. His weight is fluctuating minimally. He is following a generally unhealthy diet. He rarely participates in exercise. His home blood glucose trend is increasing steadily. His breakfast blood glucose range is generally 140-180 mg/dl. His bedtime blood glucose range is generally 140-180 mg/dl. His overall blood glucose range is 140-180 mg/dl. An ACE inhibitor/angiotensin II receptor blocker is being taken.  Hyperlipidemia  This is a chronic problem. The current episode started more than 1 year ago. Exacerbating diseases include diabetes and obesity. Pertinent negatives include no chest pain, myalgias or shortness of breath. Current antihyperlipidemic treatment includes statins. Compliance problems include adherence to diet.  Risk factors for coronary artery disease include diabetes mellitus, dyslipidemia, hypertension and male sex.  Hypertension  This is a chronic problem. The current episode started more than 1 year ago. Pertinent negatives include no chest pain, headaches, neck pain, palpitations or shortness of breath. Risk factors for coronary artery disease include dyslipidemia, diabetes mellitus, family history, male gender, sedentary lifestyle and smoking/tobacco exposure. Past treatments include ACE inhibitors. Compliance problems include diet and exercise.     Review of Systems  Constitutional: Negative for chills, fatigue, fever and unexpected weight change.  HENT: Negative for dental problem, mouth sores and trouble swallowing.   Eyes: Negative for visual disturbance.  Respiratory: Negative for cough, choking, chest tightness, shortness of breath and wheezing.   Cardiovascular: Negative for chest pain, palpitations and leg  swelling.  Gastrointestinal: Negative for abdominal distention, abdominal pain, constipation, diarrhea, nausea and vomiting.  Endocrine: Negative for polydipsia, polyphagia and polyuria.  Genitourinary: Negative for dysuria, flank pain, hematuria and urgency.  Musculoskeletal: Negative for back pain, gait problem, myalgias and neck pain.  Skin: Negative for pallor, rash and wound.  Neurological: Negative for seizures, syncope, weakness, numbness and headaches.  Psychiatric/Behavioral: Negative.  Negative for confusion and dysphoric mood.    Objective:    BP (!) 152/89   Pulse 76   Ht 5\' 9"  (1.753 m)   Wt 182 lb (82.6 kg)   BMI 26.88 kg/m   Wt Readings from Last 3 Encounters:  07/07/18 182 lb (82.6 kg)  05/12/18 182 lb (82.6 kg)  03/10/18 182 lb (82.6 kg)    Physical Exam  Constitutional: He is oriented to person, place, and time. He appears well-developed. He is cooperative. No distress.  HENT:  Head: Normocephalic and atraumatic.  Eyes: EOM are normal.  Neck: Normal range of motion. Neck supple. No tracheal deviation present. No thyromegaly present.  Cardiovascular: Normal rate, S1 normal and S2 normal. Exam reveals no gallop.  No murmur  heard. Pulses:      Dorsalis pedis pulses are 1+ on the right side and 1+ on the left side.       Posterior tibial pulses are 1+ on the right side and 1+ on the left side.  Pulmonary/Chest: Effort normal. No respiratory distress. He has no wheezes.  Abdominal: He exhibits no distension. There is no abdominal tenderness. There is no guarding and no CVA tenderness.  Musculoskeletal:        General: No edema.     Right shoulder: He exhibits no swelling and no deformity.  Neurological: He is alert and oriented to person, place, and time. He has normal strength. No cranial nerve deficit or sensory deficit. Gait normal.  Skin: Skin is warm and dry. No rash noted. No cyanosis. Nails show no clubbing.  Psychiatric: He has a normal mood and affect.  His speech is normal. Judgment normal. Cognition and memory are normal.    Results for orders placed or performed in visit on 38/46/65  Basic metabolic panel  Result Value Ref Range   BUN 23 (A) 4 - 21   Creatinine 1.2 0.6 - 1.3  Lipid panel  Result Value Ref Range   Triglycerides 107 40 - 160   Cholesterol 145 0 - 200   HDL 49 35 - 70   LDL Cholesterol 75   Hemoglobin A1c  Result Value Ref Range   Hemoglobin A1C 7.7    Complete Blood Count (Most recent): Lab Results  Component Value Date   WBC 14.8 (H) 06/08/2013   HGB 16.1 06/08/2013   HCT 44.3 06/08/2013   MCV 96.3 06/08/2013   PLT 187 06/08/2013   Chemistry (most recent): Lab Results  Component Value Date   NA 137 06/08/2013   K 3.5 02/17/2018   CL 95 (L) 06/08/2013   CO2 27 06/08/2013   BUN 23 (A) 05/26/2018   CREATININE 1.2 05/26/2018     Assessment & Plan:   1. Uncontrolled type 2 diabetes mellitus with complication, with long-term current use of insulin ([place])  -He returns with increasing glycemic profile and A1c of 7.7% increasing from 6.9% during his last visit.     -  as a chronic heavy smoker he remains at extremely high risk for more acute and chronic complications of diabetes which include CAD, CVA, CKD, retinopathy, and neuropathy. These are all discussed in detail with the patient.  -Glucose logs and insulin administration records pertaining to this visit,  to be scanned into patient's records.  Recent labs reviewed.   - I have re-counseled the patient on diet management  by adopting a carbohydrate restricted / protein rich  Diet. - Patient admits there is a room for improvement in his diet and drink choices. -  Suggestion is made for him to avoid simple carbohydrates  from his diet including Cakes, Sweet Desserts / Pastries, Ice Cream, Soda (diet and regular), Sweet Tea, Candies, Chips, Cookies, Store Bought Juices, Alcohol in Excess of  1-2 drinks a day, Artificial Sweeteners, and "Sugar-free"  Products. This will help patient to have stable blood glucose profile and potentially avoid unintended weight gain.  - Patient is advised to stick to a routine mealtimes to eat 3 meals  a day and avoid unnecessary snacks ( to snack only to correct hypoglycemia).  - I have approached patient with the following individualized plan to manage diabetes and patient agrees.  -Based on his presentation with above target glycemic profile and A1c of 7.7%, he will require  higher dose of basal insulin.  He will not require prandial insulin for now.    -He is advised to increase his Lantus to 60 units nightly,  continue to hold Humalog for now.  He will continue to monitor blood glucose 2 times a day- daily before breakfast and at bedtime.    -he promises to do better on his diet.  -He is advised to continue Metformin 1000 mg by mouth twice a day after breakfast and supper.   -He will be considered for low-dose glimepiride on next visit if A1c increases to above 8%.  - Patient specific target  for A1c; LDL, HDL, Triglycerides, and  Waist Circumference were discussed in detail.  2) BP/HTN: His blood pressure is not controlled to target.  He is advised to be  consistent taking his  hydrochlorothiazide  25 mg by mouth daily along with losartan 100 mg p.o. daily, he is extensively counseled against smoking.   3) Lipids/HPL: His recent lipid panel showed LDL controlled at 69.  He is advised to continue atorvastatin 40 mg p.o. nightly.    4)  Weight/Diet: CDE consult in progress, he has a steady body weight recently , exercise, and carbohydrates information provided.  5) Chronic Care/Health Maintenance:  -Patient is on ACEI/ARB and Statin medications and encouraged to continue to follow up with Ophthalmology, Podiatrist at least yearly or according to recommendations, and advised to quit smoking  (he has resumed heavy smoking).  I have recommended yearly flu vaccine and pneumonia vaccination at least every 5  years; moderate intensity exercise for up to 150 minutes weekly; and  sleep for at least 7 hours a day.  - I advised patient to maintain close follow up with Dione Housekeeper, MD for primary care needs.  - Time spent with the patient: 25 min, of which >50% was spent in reviewing his blood glucose logs , discussing his hypoglycemia and hyperglycemia episodes, reviewing his current and  previous labs / studies and medications  doses and developing a plan to avoid hypoglycemia and hyperglycemia. Please refer to Patient Instructions for Blood Glucose Monitoring and Insulin/Medications Dosing Guide"  in media tab for additional information. Cardin Tennison participated in the discussions, expressed understanding, and voiced agreement with the above plans.  All questions were answered to his satisfaction. he is encouraged to contact clinic should he have any questions or concerns prior to his return visit.   Follow up plan: -Return in about 3 months (around 10/06/2018) for Follow up with Pre-visit Labs, Meter, and Logs.  Glade Lloyd, MD Phone: [phone]  Fax: [phone]   -  This note was partially dictated with voice recognition software. Similar sounding words can be transcribed inadequately or may not  be corrected upon review.  07/07/2018, 1:23 PM

## 2018-07-07 NOTE — Patient Instructions (Signed)

## 2018-07-14 ENCOUNTER — Ambulatory Visit: Payer: Medicare Other | Admitting: "Endocrinology

## 2018-10-06 ENCOUNTER — Ambulatory Visit (INDEPENDENT_AMBULATORY_CARE_PROVIDER_SITE_OTHER): Payer: Medicare Other | Admitting: "Endocrinology

## 2018-10-06 ENCOUNTER — Other Ambulatory Visit: Payer: Self-pay

## 2018-10-06 ENCOUNTER — Encounter: Payer: Self-pay | Admitting: "Endocrinology

## 2018-10-06 DIAGNOSIS — Z794 Long term (current) use of insulin: Secondary | ICD-10-CM

## 2018-10-06 DIAGNOSIS — E1165 Type 2 diabetes mellitus with hyperglycemia: Secondary | ICD-10-CM | POA: Diagnosis not present

## 2018-10-06 DIAGNOSIS — I10 Essential (primary) hypertension: Secondary | ICD-10-CM | POA: Diagnosis not present

## 2018-10-06 DIAGNOSIS — E782 Mixed hyperlipidemia: Secondary | ICD-10-CM

## 2018-10-06 DIAGNOSIS — E118 Type 2 diabetes mellitus with unspecified complications: Secondary | ICD-10-CM | POA: Diagnosis not present

## 2018-10-06 DIAGNOSIS — IMO0002 Reserved for concepts with insufficient information to code with codable children: Secondary | ICD-10-CM

## 2018-10-06 NOTE — Progress Notes (Signed)
Endocrinology Telehealth Visit Follow up Note -During COVID -19 Pandemic  This visit type was conducted due to national recommendations for restrictions regarding the COVID-19 Pandemic  in an effort to limit this patient's exposure and mitigate transmission of the corona virus.  Due to his co-morbid illnesses, Randy [name] is at  moderate to high risk for complications without adequate follow up.  This format is felt to be most appropriate for him at this time.  I connected with this patient on 10/06/2018   by telephone and verified that I am speaking with the correct person using two identifiers. Randy [name], 07-Apr-1951. he has verbally consented to this visit. All issues noted in this document were discussed and addressed. The format was not optimal for physical exam.       Subjective:    Patient ID: Randy [name], male    DOB: 1950-11-25, PCP Dione Housekeeper, MD   Past Medical History:  Diagnosis Date  . Diabetes mellitus without complication ([place])   . Hyperlipidemia   . Hypertension    Past Surgical History:  Procedure Laterality Date  . HEMORRHOID SURGERY N/A 06/08/2013   Procedure: HEMORRHOIDECTOMY;  Surgeon: Leighton Ruff, MD;  Location: [facility];  Service: General;  Laterality: N/A;  . NO PAST SURGERIES     Social History   Socioeconomic History  . Marital status: Single    Spouse name: Not on file  . Number of children: Not on file  . Years of education: Not on file  . Highest education level: Not on file  Occupational History  . Not on file  Social Needs  . Financial resource strain: Not on file  . Food insecurity:    Worry: Not on file    Inability: Not on file  . Transportation needs:    Medical: Not on file    Non-medical: Not on file  Tobacco Use  . Smoking status: Current Every Day Smoker    Years: 15.00  . Smokeless tobacco: Never Used  Substance and Sexual Activity  .  Alcohol use: Never    Frequency: Never    Comment: rarely  . Drug use: No  . Sexual activity: Not on file  Lifestyle  . Physical activity:    Days per week: Not on file    Minutes per session: Not on file  . Stress: Not on file  Relationships  . Social connections:    Talks on phone: Not on file    Gets together: Not on file    Attends religious service: Not on file    Active member of club or organization: Not on file    Attends meetings of clubs or organizations: Not on file    Relationship status: Not on file  Other Topics Concern  . Not on file  Social History Narrative  . Not on file   Outpatient Encounter Medications as of 10/06/2018  Medication Sig  . aspirin EC 81 MG tablet Take 81 mg by mouth daily.  Marland Kitchen atorvastatin (LIPITOR) 40 MG tablet Take 40 mg by mouth every morning.   . benazepril (LOTENSIN) 40 MG tablet Take 40 mg by mouth daily.  . cilostazol (PLETAL) 100 MG tablet Take  1 tablet (100 mg total) by mouth 2 (two) times daily before a meal.  . hydrochlorothiazide (HYDRODIURIL) 25 MG tablet TAKE ONE (1) TABLET EACH DAY  . Insulin Glargine (LANTUS SOLOSTAR) 100 UNIT/ML Solostar Pen Inject 60 Units into the skin at bedtime.  . Insulin Pen Needle (B-D ULTRAFINE III SHORT PEN) 31G X 8 MM MISC 1 each by Does not apply route as directed.  Marland Kitchen losartan (COZAAR) 100 MG tablet Take 100 mg by mouth daily.  . metFORMIN (GLUCOPHAGE) 1000 MG tablet Take 1,000 mg by mouth 2 (two) times daily with a meal.  . Potassium Chloride ER 20 MEQ TBCR Take 20 mEq by mouth daily.   . psyllium (HYDROCIL/METAMUCIL) 95 % PACK Take 1 packet by mouth daily.  . sertraline (ZOLOFT) 100 MG tablet Take 100 mg by mouth daily.   No facility-administered encounter medications on file as of 10/06/2018.    ALLERGIES: No Known Allergies VACCINATION STATUS: Immunization History  Administered Date(s) Administered  . Influenza, High Dose Seasonal PF 04/07/2017  . Influenza,inj,Quad PF,6+ Mos 03/02/2014     Diabetes  He presents for his follow-up diabetic visit. He has type 2 diabetes mellitus. Onset time: He was diagnosed at approximate age of 28 years. His disease course has been improving. There are no hypoglycemic associated symptoms. Pertinent negatives for hypoglycemia include no confusion, headaches, pallor or seizures. There are no diabetic associated symptoms. Pertinent negatives for diabetes include no chest pain, no fatigue, no polydipsia, no polyphagia, no polyuria and no weakness. There are no hypoglycemic complications. Symptoms are improving. There are no diabetic complications. Risk factors for coronary artery disease include dyslipidemia, diabetes mellitus, hypertension, sedentary lifestyle and male sex. Current diabetic treatment includes insulin injections and oral agent (monotherapy). He is compliant with treatment most of the time. His weight is fluctuating minimally. He is following a generally unhealthy diet. He rarely participates in exercise. His home blood glucose trend is increasing steadily. His breakfast blood glucose range is generally 140-180 mg/dl. His overall blood glucose range is 140-180 mg/dl. An ACE inhibitor/angiotensin II receptor blocker is being taken.  Hyperlipidemia  This is a chronic problem. The current episode started more than 1 year ago. Exacerbating diseases include diabetes and obesity. Pertinent negatives include no chest pain, myalgias or shortness of breath. Current antihyperlipidemic treatment includes statins. Compliance problems include adherence to diet.  Risk factors for coronary artery disease include diabetes mellitus, dyslipidemia, hypertension and male sex.  Hypertension  This is a chronic problem. The current episode started more than 1 year ago. Pertinent negatives include no chest pain, headaches, neck pain, palpitations or shortness of breath. Risk factors for coronary artery disease include dyslipidemia, diabetes mellitus, family history,  male gender, sedentary lifestyle and smoking/tobacco exposure. Past treatments include ACE inhibitors. Compliance problems include diet and exercise.    Review of systems: Limited as above.   Objective:    There were no vitals taken for this visit.  Wt Readings from Last 3 Encounters:  07/07/18 182 lb (82.6 kg)  05/12/18 182 lb (82.6 kg)  03/10/18 182 lb (82.6 kg)     Results for orders placed or performed in visit on 79/89/21  Basic metabolic panel  Result Value Ref Range   BUN 23 (A) 4 - 21   Creatinine 1.2 0.6 - 1.3  Lipid panel  Result Value Ref Range   Triglycerides 107 40 - 160   Cholesterol 145 0 - 200   HDL 49 35 - 70   LDL Cholesterol  75   Hemoglobin A1c  Result Value Ref Range   Hemoglobin A1C 7.7    Complete Blood Count (Most recent): Lab Results  Component Value Date   WBC 14.8 (H) 06/08/2013   HGB 16.1 06/08/2013   HCT 44.3 06/08/2013   MCV 96.3 06/08/2013   PLT 187 06/08/2013   Chemistry (most recent): Lab Results  Component Value Date   NA 137 06/08/2013   K 3.5 02/17/2018   CL 95 (L) 06/08/2013   CO2 27 06/08/2013   BUN 23 (A) 05/26/2018   CREATININE 1.2 05/26/2018     Assessment & Plan:   1. Uncontrolled type 2 diabetes mellitus with complication, with long-term current use of insulin ([place])  -His previsit labs show improved A1c of 7.4%, reports no hypoglycemic episodes.      -  as a chronic heavy smoker he remains at extremely high risk for more acute and chronic complications of diabetes which include CAD, CVA, CKD, retinopathy, and neuropathy. These are all discussed in detail with the patient.  -  Recent labs reviewed.   - I have re-counseled the patient on diet management  by adopting a carbohydrate restricted / protein rich  Diet.  - Patient admits there is a room for improvement in his diet and drink choices. -  Suggestion is made for him to avoid simple carbohydrates  from his diet including Cakes, Sweet Desserts / Pastries,  Ice Cream, Soda (diet and regular), Sweet Tea, Candies, Chips, Cookies, Store Bought Juices, Alcohol in Excess of  1-2 drinks a day, Artificial Sweeteners, and "Sugar-free" Products. This will help patient to have stable blood glucose profile and potentially avoid unintended weight gain.   - Patient is advised to stick to a routine mealtimes to eat 3 meals  a day and avoid unnecessary snacks ( to snack only to correct hypoglycemia).  - I have approached patient with the following individualized plan to manage diabetes and patient agrees.  -Based on his near target glycemic profile, he will continue  without prandial insulin.   -He is advised to to new Lantus 60 units nightly, continue to hold Humalog.  He will continue to monitor blood glucose 2 times a day-daily before breakfast and at bedtime.    -He is advised to continue Metformin 1000 mg by mouth twice a day after breakfast and supper.   -He will be considered for low-dose glimepiride on next visit if A1c increases to above 8%.  - Patient specific target  for A1c; LDL, HDL, Triglycerides, and  Waist Circumference were discussed in detail.  2) BP/HTN: he is advised to home monitor blood pressure and report if > 140/90 on 2 separate readings.  - He is advised to be  consistent taking his  hydrochlorothiazide  25 mg by mouth daily along with losartan 100 mg p.o. daily, he is extensively counseled against smoking.   3) Lipids/HPL: His recent lipid panel showed LDL controlled at 69.  He is advised to continue atorvastatin 40 mg p.o. nightly.     4) Chronic Care/Health Maintenance:  -Patient is on ACEI/ARB and Statin medications and encouraged to continue to follow up with Ophthalmology, Podiatrist at least yearly or according to recommendations, and advised to quit smoking  (he has resumed heavy smoking).  I have recommended yearly flu vaccine and pneumonia vaccination at least every 5 years; moderate intensity exercise for up to 150 minutes  weekly; and  sleep for at least 7 hours a day.  - I advised patient  to maintain close follow up with Dione Housekeeper, MD for primary care needs.  - Patient Care Time Today:  25 min, of which >50% was spent in reviewing his  current and  previous labs/studies, previous treatments, and medications doses and developing a plan for long-term care based on the latest recommendations for standards of care.  Randy [name] participated in the discussions, expressed understanding, and voiced agreement with the above plans.  All questions were answered to his satisfaction. he is encouraged to contact clinic should he have any questions or concerns prior to his return visit.    Follow up plan: -Return in about 6 months (around 04/07/2019) for Follow up with Pre-visit Labs, Meter, and Logs.  Glade Lloyd, MD Phone: [phone]  Fax: [phone]   -  This note was partially dictated with voice recognition software. Similar sounding words can be transcribed inadequately or may not  be corrected upon review.  10/06/2018, 4:15 PM

## 2019-01-20 DIAGNOSIS — B351 Tinea unguium: Secondary | ICD-10-CM | POA: Diagnosis not present

## 2019-01-20 DIAGNOSIS — L84 Corns and callosities: Secondary | ICD-10-CM | POA: Diagnosis not present

## 2019-01-20 DIAGNOSIS — E1151 Type 2 diabetes mellitus with diabetic peripheral angiopathy without gangrene: Secondary | ICD-10-CM | POA: Diagnosis not present

## 2019-01-20 DIAGNOSIS — M79676 Pain in unspecified toe(s): Secondary | ICD-10-CM | POA: Diagnosis not present

## 2019-02-11 ENCOUNTER — Other Ambulatory Visit: Payer: Self-pay

## 2019-02-11 DIAGNOSIS — I70213 Atherosclerosis of native arteries of extremities with intermittent claudication, bilateral legs: Secondary | ICD-10-CM

## 2019-02-16 ENCOUNTER — Ambulatory Visit: Payer: Medicare Other | Admitting: Family

## 2019-02-16 ENCOUNTER — Encounter (HOSPITAL_COMMUNITY): Payer: Medicare Other

## 2019-02-17 ENCOUNTER — Encounter: Payer: Self-pay | Admitting: Family

## 2019-02-23 ENCOUNTER — Other Ambulatory Visit: Payer: Self-pay | Admitting: "Endocrinology

## 2019-03-07 ENCOUNTER — Other Ambulatory Visit: Payer: Self-pay

## 2019-03-07 DIAGNOSIS — I70219 Atherosclerosis of native arteries of extremities with intermittent claudication, unspecified extremity: Secondary | ICD-10-CM

## 2019-03-09 ENCOUNTER — Ambulatory Visit: Payer: Medicare Other | Admitting: Family

## 2019-03-09 ENCOUNTER — Encounter: Payer: Self-pay | Admitting: Family

## 2019-03-09 ENCOUNTER — Other Ambulatory Visit: Payer: Self-pay

## 2019-03-09 ENCOUNTER — Ambulatory Visit (HOSPITAL_COMMUNITY)
Admission: RE | Admit: 2019-03-09 | Discharge: 2019-03-09 | Disposition: A | Discharge status: Home / Self Care | Payer: Medicare Other | Source: Ambulatory Visit | Attending: Family | Admitting: Family

## 2019-03-09 VITALS — BP 175/87 | HR 74 | Temp 97.8°F | Resp 14 | Ht 69.0 in | Wt 186.3 lb

## 2019-03-09 DIAGNOSIS — F17200 Nicotine dependence, unspecified, uncomplicated: Secondary | ICD-10-CM

## 2019-03-09 DIAGNOSIS — I70219 Atherosclerosis of native arteries of extremities with intermittent claudication, unspecified extremity: Secondary | ICD-10-CM | POA: Insufficient documentation

## 2019-03-09 DIAGNOSIS — I70213 Atherosclerosis of native arteries of extremities with intermittent claudication, bilateral legs: Secondary | ICD-10-CM | POA: Diagnosis not present

## 2019-03-09 NOTE — Progress Notes (Addendum)
CC: Follow up Intermittent Claudication  History of Present Illness  Randy [name] is a 68 y.o. (09/14/1950) male who returns for follow up of intermittent claudication.  Dr. Bridgett Larsson saw pt initially on 10-21-17.   He denies any change in claudication symptoms over the past 9 months.  He is still able to walk 50 to 100 yards before experiencing right calf cramping, if he continues to walk his left calf will cramp.  This is relieved with rest.  He denies any rest pain or active tissue changes.    He was last evaluated on 05-12-18 by M. Eveland PA-C. At that time there was no significant change in ABIs or claudication symptoms over the prior 6 months Walking program encouraged. Continue Pletal, aspirin, and statin. Encourage smoking cessation. Recheck ABIs in 1 year.  He was taking Pletal, but stopped use about April 2020 as he states he noticed no benefit re his walking distance and claudication.   He is being evaluated by Steffanie Rainwater, DPM.    Diabetic: Yes, 7.7 A1C on 05-26-18 Tobacco Use: current smoker, 1 ppd, started in his 20's  Pt meds include: Statin :Yes Betablocker: No ASA: Yes Other anticoagulants/antiplatelets: no   Past Medical History:  Diagnosis Date  . Diabetes mellitus without complication ([place])   . Hyperlipidemia   . Hypertension     Social History Social History   Tobacco Use  . Smoking status: Current Every Day Smoker    Years: 15.00  . Smokeless tobacco: Never Used  Substance Use Topics  . Alcohol use: Never    Frequency: Never    Comment: rarely  . Drug use: No    Family History History reviewed. No pertinent family history.  Surgical History Past Surgical History:  Procedure Laterality Date  . HEMORRHOID SURGERY N/A 06/08/2013   Procedure: HEMORRHOIDECTOMY;  Surgeon: Leighton Ruff, MD;  Location: [facility];  Service: General;  Laterality: N/A;  . NO PAST SURGERIES      No Known Allergies  Current Outpatient Medications   Medication Sig Dispense Refill  . aspirin EC 81 MG tablet Take 81 mg by mouth daily.    Marland Kitchen atorvastatin (LIPITOR) 40 MG tablet Take 40 mg by mouth every morning.     . benazepril (LOTENSIN) 40 MG tablet Take 40 mg by mouth daily.    . cilostazol (PLETAL) 100 MG tablet Take 1 tablet (100 mg total) by mouth 2 (two) times daily before a meal. 60 tablet 11  . hydrochlorothiazide (HYDRODIURIL) 25 MG tablet TAKE ONE (1) TABLET EACH DAY 90 tablet 0  . Insulin Pen Needle (B-D ULTRAFINE III SHORT PEN) 31G X 8 MM MISC 1 each by Does not apply route as directed. 150 each 3  . LANTUS SOLOSTAR 100 UNIT/ML Solostar Pen INJECT 60 UNITS SQ AT BEDTIME 15 mL 3  . losartan (COZAAR) 100 MG tablet Take 100 mg by mouth daily.    . metFORMIN (GLUCOPHAGE) 1000 MG tablet Take 1,000 mg by mouth 2 (two) times daily with a meal.    . Potassium Chloride ER 20 MEQ TBCR Take 20 mEq by mouth daily.     . psyllium (HYDROCIL/METAMUCIL) 95 % PACK Take 1 packet by mouth daily.    . sertraline (ZOLOFT) 100 MG tablet Take 100 mg by mouth daily.     No current facility-administered medications for this visit.     REVIEW OF SYSTEMS: see HPI for pertinent positives and negatives    Physical Examination  Vitals:  03/09/19 1128  BP: (!) 175/87  Pulse: 74  Resp: 14  Temp: 97.8 F (36.6 C)  TempSrc: Temporal  SpO2: 97%  Weight: 186 lb 4.8 oz (84.5 kg)  Height: 5\' 9"  (1.753 m)   Body mass index is 27.51 kg/m.  General: The patient appears his stated age, in NAD.   HEENT:  No gross abnormalities Pulmonary: Respirations are non-labored, fair air movement in all fields, no rales, rhonchi, or wheezes Abdomen: Soft and non-tender with normal pitched bowel sounds. Musculoskeletal: There are no major deformities.   Neurologic: No focal weakness or paresthesias are detected Skin: There are no ulcers or rashes noted, no cellulitis. Psychiatric: The patient has normal affect. Cardiovascular: There is a regular rate and rhythm  without significant murmur appreciated.   Vascular: Vessel Right Left  Radial 2+Palpable 2+Palpable  Carotid  without bruit  without bruit  Aorta Not palpable N/A  Femoral 2+Palpable 2+Palpable  Popliteal Not palpable Not palpable  PT Not Palpable Not Palpable  DP Not Palpable Not Palpable    DATA  ABI (Date: 03/09/2019): ABI Findings: +---------+------------------+-----+----------+--------+ Right    Rt Pressure (mmHg)IndexWaveform  Comment  +---------+------------------+-----+----------+--------+ Brachial 220                    triphasic          +---------+------------------+-----+----------+--------+ PTA      111               0.50 monophasic         +---------+------------------+-----+----------+--------+ DP       108               0.49 monophasic         +---------+------------------+-----+----------+--------+ Great Toe84                0.38 Abnormal           +---------+------------------+-----+----------+--------+  +---------+------------------+-----+----------+-------+ Left     Lt Pressure (mmHg)IndexWaveform  Comment +---------+------------------+-----+----------+-------+ Brachial 222                    triphasic         +---------+------------------+-----+----------+-------+ PTA      123               0.55 monophasic        +---------+------------------+-----+----------+-------+ DP       115               0.52 monophasic        +---------+------------------+-----+----------+-------+ Great Toe115               0.52 Abnormal          +---------+------------------+-----+----------+-------+  +-------+-----------+-----------+------------+------------+ ABI/TBIToday's ABIToday's TBIPrevious ABIPrevious TBI +-------+-----------+-----------+------------+------------+ Right  0.50       0.38       0.61        0.41         +-------+-----------+-----------+------------+------------+ Left   0.52        0.52       0.72        0.48         +-------+-----------+-----------+------------+------------+  Decline in bilateral ABI's compared to 05-12-18   Summary: Right: Resting right ankle-brachial index indicates moderate right lower extremity arterial disease. The right toe-brachial index is abnormal. RT great toe pressure = 84 mmHg.  Left: Resting left ankle-brachial index indicates moderate left lower extremity arterial disease. The left toe-brachial index is abnormal. LT Great toe pressure = 115  mmHg.    Medical Decision Making  Randy [name] is a 68 y.o. male who presents with:  right calf intermittent claudication after walking 50-100 yards, followed by left calf claudication if he continues walking, without evidence of critical limb ischemia.  His bilateral femoral pulses are 2+ palpable.   Based on the patient's vascular studies and examination, I have offered the patient: return in 3 months with bilateral LE arterial duplex and ABI's.  Over 3 minutes was spent counseling patient re smoking cessation, and patient was given several free resources re smoking cessation.  Gradually increase walking to total at least 30 minutes daily, stop when needed, resume walking when claudication pain subsides, and repeat, etc.   I discussed in depth with the patient the nature of atherosclerosis, and emphasized the importance of maximal medical management including strict control of blood pressure, blood glucose, and lipid levels, antiplatelet agents, obtaining regular exercise, and cessation of smoking.         I discussed with the patient the natural history of intermittent claudication: 75% of patients have stable or improved symptoms in a year an only 2% require amputation. Eventually 20% may require intervention in a year.  The patient is aware that without maximal medical management the underlying atherosclerotic disease process will progress, limiting the benefit of any interventions.  The patient is currently on a statin: atorvastatin.    The patient is currently is on an anti-platelet: 81 mg daily ASA.   Thank you for allowing [place] to participate in this patient's care.  Clemon Chambers, RN, MSN, FNP-C [facility] Office: [phone]  03/09/2019, 11:47 AM  Clinic MD: Laqueta Due

## 2019-03-09 NOTE — Patient Instructions (Signed)
Steps to Quit Smoking Smoking tobacco is the leading cause of preventable death. It can affect almost every organ in the body. Smoking puts you and people around you at risk for many serious, long-lasting (chronic) diseases. Quitting smoking can be hard, but it is one of the best things that you can do for your health. It is never too late to quit. How do I get ready to quit? When you decide to quit smoking, make a plan to help you succeed. Before you quit:  Pick a date to quit. Set a date within the next 2 weeks to give you time to prepare.  Write down the reasons why you are quitting. Keep this list in places where you will see it often.  Tell your family, friends, and co-workers that you are quitting. Their support is important.  Talk with your doctor about the choices that may help you quit.  Find out if your health insurance will pay for these treatments.  Know the people, places, things, and activities that make you want to smoke (triggers). Avoid them. What first steps can I take to quit smoking?  Throw away all cigarettes at home, at work, and in your car.  Throw away the things that you use when you smoke, such as ashtrays and lighters.  Clean your car. Make sure to empty the ashtray.  Clean your home, including curtains and carpets. What can I do to help me quit smoking? Talk with your doctor about taking medicines and seeing a counselor at the same time. You are more likely to succeed when you do both.  If you are pregnant or breastfeeding, talk with your doctor about counseling or other ways to quit smoking. Do not take medicine to help you quit smoking unless your doctor tells you to do so. To quit smoking: Quit right away  Quit smoking totally, instead of slowly cutting back on how much you smoke over a period of time.  Go to counseling. You are more likely to quit if you go to counseling sessions regularly. Take medicine You may take medicines to help you quit. Some  medicines need a prescription, and some you can buy over-the-counter. Some medicines may contain a drug called nicotine to replace the nicotine in cigarettes. Medicines may:  Help you to stop having the desire to smoke (cravings).  Help to stop the problems that come when you stop smoking (withdrawal symptoms). Your doctor may ask you to use:  Nicotine patches, gum, or lozenges.  Nicotine inhalers or sprays.  Non-nicotine medicine that is taken by mouth. Find resources Find resources and other ways to help you quit smoking and remain smoke-free after you quit. These resources are most helpful when you use them often. They include:  Online chats with a counselor.  Phone quitlines.  Printed self-help materials.  Support groups or group counseling.  Text messaging programs.  Mobile phone apps. Use apps on your mobile phone or tablet that can help you stick to your quit plan. There are many free apps for mobile phones and tablets as well as websites. Examples include Quit Guide from the CDC and smokefree.gov  What things can I do to make it easier to quit?   Talk to your family and friends. Ask them to support and encourage you.  Call a phone quitline (1-800-QUIT-NOW), reach out to support groups, or work with a counselor.  Ask people who smoke to not smoke around you.  Avoid places that make you want to smoke,   such as: ? Bars. ? Parties. ? Smoke-break areas at work.  Spend time with people who do not smoke.  Lower the stress in your life. Stress can make you want to smoke. Try these things to help your stress: ? Getting regular exercise. ? Doing deep-breathing exercises. ? Doing yoga. ? Meditating. ? Doing a body scan. To do this, close your eyes, focus on one area of your body at a time from head to toe. Notice which parts of your body are tense. Try to relax the muscles in those areas. How will I feel when I quit smoking? Day 1 to 3 weeks Within the first 24 hours,  you may start to have some problems that come from quitting tobacco. These problems are very bad 2-3 days after you quit, but they do not often last for more than 2-3 weeks. You may get these symptoms:  Mood swings.  Feeling restless, nervous, angry, or annoyed.  Trouble concentrating.  Dizziness.  Strong desire for high-sugar foods and nicotine.  Weight gain.  Trouble pooping (constipation).  Feeling like you may vomit (nausea).  Coughing or a sore throat.  Changes in how the medicines that you take for other issues work in your body.  Depression.  Trouble sleeping (insomnia). Week 3 and afterward After the first 2-3 weeks of quitting, you may start to notice more positive results, such as:  Better sense of smell and taste.  Less coughing and sore throat.  Slower heart rate.  Lower blood pressure.  Clearer skin.  Better breathing.  Fewer sick days. Quitting smoking can be hard. Do not give up if you fail the first time. Some people need to try a few times before they succeed. Do your best to stick to your quit plan, and talk with your doctor if you have any questions or concerns. Summary  Smoking tobacco is the leading cause of preventable death. Quitting smoking can be hard, but it is one of the best things that you can do for your health.  When you decide to quit smoking, make a plan to help you succeed.  Quit smoking right away, not slowly over a period of time.  When you start quitting, seek help from your doctor, family, or friends. This information is not intended to replace advice given to you by your health care provider. Make sure you discuss any questions you have with your health care provider. Document Released: 03/22/2009 Document Revised: 08/13/2018 Document Reviewed: 08/14/2018 Elsevier Patient Education  2020 [employer]     Intermittent Claudication Intermittent claudication is pain in one or both legs that occurs when walking or  exercising and goes away when resting. Intermittent claudication is a symptom of peripheral arterial disease (PAD). This condition is commonly treated with rest, medicine, and healthy lifestyle changes. If medical management does not improve symptoms, surgery can be done to restore blood flow (revascularization) to the affected leg. What are the causes?  This condition is caused by buildup of fatty material (plaque) within the major arteries in the body (atherosclerosis). Plaque makes arteries stiff and narrow, which prevents enough blood from reaching the leg muscles. Pain occurs when you walk or exercise because your muscles need (but cannot get) more blood when you are moving and exercising. What increases the risk? The following factors may make you more likely to develop this condition:  A family history of atherosclerosis.  A personal history of stroke or heart disease.  Older age.  Being inactive (sedentary lifestyle).    Being overweight.  Smoking cigarettes.  Having another health condition such as: ? Diabetes. ? High blood pressure. ? High cholesterol. What are the signs or symptoms? Symptoms of this condition may first develop in the lower leg, and then they may spread to the thigh, hip, buttock, or the back of the lower leg (calf) over time. Symptoms may include:  Aches or pains.  Cramps.  A feeling of tightness, weakness, or heaviness.  A wound on the lower leg or foot that heals poorly or does not heal. How is this diagnosed? This condition may be diagnosed based on:  Your symptoms.  Your medical history.  Tests, such as: ? Blood tests. ? Arterial duplex ultrasound. This test uses images of blood vessels and surrounding organs to evaluate blood flow within arteries. ? Angiogram. In this procedure, dye is injected into arteries and then X-rays are taken. ? Magnetic resonance angiogram (MRA). In this procedure, strong magnets and radio waves are used instead of  X-rays to create images of blood vessels and blood flow. ? CT angiogram (CTA). In this procedure, a large X-ray machine called a CT scanner takes detailed pictures of blood vessels that have been injected with dye. ? Ankle-brachial index (ABI) test. This procedure measures blood pressure in the leg during exercise and at rest. ? Exercise test. For this test, you will walk on a treadmill while tests are done (such as the ABI test) to evaluate how this condition affects your ability to walk or exercise. How is this treated? Treatment for this condition may involve treatment for the underlying cause, such as treatment for high blood pressure, high cholesterol, or diabetes. Treatment may include:  Lifestyle changes such as: ? Starting a supervised or home-based exercise program. ? Losing weight. ? Quitting smoking.  Medicines to help restore blood flow through your legs.  Blood vessel surgery (angioplasty) to restore blood flow around the blocked vessel. This is also known as endovascular therapy (EVT). This is only done if your intermittent claudication is caused by severe peripheral artery disease, a condition in which blood flow is severely or totally restricted by the narrowing of the arteries. Follow these instructions at home: Lifestyle   Maintain a healthy weight.  Eat a diet that is low in saturated fats and calories. Consider working with a diet and nutrition specialist (dietitian) to help you make healthy food choices.  Do not use any products that contain nicotine or tobacco, such as cigarettes and e-cigarettes. If you need help quitting, ask your health care provider.  If your health care provider recommended an exercise program for you, follow it as directed. Your exercise program may involve: ? Walking 3 or more times a week. ? Walking until you have certain symptoms of intermittent claudication. ? Resting until symptoms go away. ? Gradually increasing your walking time to  about 50 minutes a day. General instructions  Work with your health care provider to manage any other health conditions you may have, including diabetes, high blood pressure, or high cholesterol.  Take over-the-counter and prescription medicines only as told by your health care provider.  Keep all follow-up visits as told by your health care provider. This is important. Contact a health care provider if:  Your pain does not go away with rest.  You have sores on your legs that do not heal or have a bad smell or pus coming from them.  Your condition gets worse or does not get better with treatment. Get help right away if:    You have chest pain.  You have difficulty breathing.  You develop arm weakness.  You have trouble speaking.  Your face begins to droop.  Your foot or leg is cold or it changes color.  Your foot or leg becomes numb. These symptoms may represent a serious problem that is an emergency. Do not wait to see if the symptoms will go away. Get medical help right away. Call your local emergency services (911 in the [place] Do not drive yourself to the hospital.  Summary  Intermittent claudication is pain in one or both legs that occurs when walking or exercising and goes away when resting.  This condition is caused by buildup of fatty material (plaque) within the major arteries in the body (atherosclerosis). Plaque makes arteries stiff and narrow, which prevents enough blood from reaching the leg muscles.  Intermittent claudication can be treated with medicine and lifestyle changes. If medical treatment fails, surgery can be done to help return blood flow to the affected area.  Make sure you work with your health care provider to manage any other health conditions you may have, including diabetes, high blood pressure, or high cholesterol. This information is not intended to replace advice given to you by your health care provider. Make sure you discuss any questions you  have with your health care provider. Document Released: 03/28/2004 Document Revised: 05/08/2017 Document Reviewed: 06/26/2016 Elsevier Patient Education  2020 [employer].  

## 2019-03-30 ENCOUNTER — Ambulatory Visit: Payer: Medicare Other | Admitting: Family Medicine

## 2019-04-06 DIAGNOSIS — D3132 Benign neoplasm of left choroid: Secondary | ICD-10-CM | POA: Diagnosis not present

## 2019-04-06 DIAGNOSIS — H25813 Combined forms of age-related cataract, bilateral: Secondary | ICD-10-CM | POA: Diagnosis not present

## 2019-04-06 DIAGNOSIS — E119 Type 2 diabetes mellitus without complications: Secondary | ICD-10-CM | POA: Diagnosis not present

## 2019-04-07 ENCOUNTER — Ambulatory Visit: Payer: Medicare Other | Admitting: "Endocrinology

## 2019-04-20 ENCOUNTER — Ambulatory Visit: Payer: Medicare Other | Admitting: Family Medicine

## 2019-04-20 DIAGNOSIS — E113293 Type 2 diabetes mellitus with mild nonproliferative diabetic retinopathy without macular edema, bilateral: Secondary | ICD-10-CM | POA: Diagnosis not present

## 2019-04-20 DIAGNOSIS — H43391 Other vitreous opacities, right eye: Secondary | ICD-10-CM | POA: Diagnosis not present

## 2019-04-20 DIAGNOSIS — H2513 Age-related nuclear cataract, bilateral: Secondary | ICD-10-CM | POA: Diagnosis not present

## 2019-04-20 DIAGNOSIS — D3132 Benign neoplasm of left choroid: Secondary | ICD-10-CM | POA: Diagnosis not present

## 2019-04-21 DIAGNOSIS — L84 Corns and callosities: Secondary | ICD-10-CM | POA: Diagnosis not present

## 2019-04-21 DIAGNOSIS — M79676 Pain in unspecified toe(s): Secondary | ICD-10-CM | POA: Diagnosis not present

## 2019-04-21 DIAGNOSIS — E1151 Type 2 diabetes mellitus with diabetic peripheral angiopathy without gangrene: Secondary | ICD-10-CM | POA: Diagnosis not present

## 2019-04-21 DIAGNOSIS — B351 Tinea unguium: Secondary | ICD-10-CM | POA: Diagnosis not present

## 2019-04-25 ENCOUNTER — Other Ambulatory Visit: Payer: Self-pay

## 2019-04-27 ENCOUNTER — Encounter: Payer: Self-pay | Admitting: Family Medicine

## 2019-04-27 ENCOUNTER — Ambulatory Visit (INDEPENDENT_AMBULATORY_CARE_PROVIDER_SITE_OTHER): Payer: Medicare Other | Admitting: Family Medicine

## 2019-04-27 ENCOUNTER — Other Ambulatory Visit: Payer: Self-pay

## 2019-04-27 VITALS — BP 178/81 | HR 84 | Temp 98.0°F | Ht 69.0 in | Wt 189.2 lb

## 2019-04-27 DIAGNOSIS — I10 Essential (primary) hypertension: Secondary | ICD-10-CM | POA: Diagnosis not present

## 2019-04-27 DIAGNOSIS — Z794 Long term (current) use of insulin: Secondary | ICD-10-CM | POA: Diagnosis not present

## 2019-04-27 DIAGNOSIS — E1165 Type 2 diabetes mellitus with hyperglycemia: Secondary | ICD-10-CM | POA: Diagnosis not present

## 2019-04-27 DIAGNOSIS — F17200 Nicotine dependence, unspecified, uncomplicated: Secondary | ICD-10-CM

## 2019-04-27 DIAGNOSIS — Z23 Encounter for immunization: Secondary | ICD-10-CM

## 2019-04-27 DIAGNOSIS — E118 Type 2 diabetes mellitus with unspecified complications: Secondary | ICD-10-CM | POA: Diagnosis not present

## 2019-04-27 DIAGNOSIS — IMO0002 Reserved for concepts with insufficient information to code with codable children: Secondary | ICD-10-CM

## 2019-04-27 DIAGNOSIS — F3342 Major depressive disorder, recurrent, in full remission: Secondary | ICD-10-CM | POA: Diagnosis not present

## 2019-04-27 DIAGNOSIS — E782 Mixed hyperlipidemia: Secondary | ICD-10-CM

## 2019-04-27 LAB — BAYER DCA HB A1C WAIVED: HB A1C (BAYER DCA - WAIVED): 7.3 % — ABNORMAL HIGH (ref <7.0)

## 2019-04-27 MED ORDER — SERTRALINE HCL 100 MG PO TABS: 100.0000 mg | ORAL_TABLET | Freq: Every day | ORAL | 3 refills | Status: DC

## 2019-04-27 MED ORDER — LANTUS SOLOSTAR 100 UNIT/ML ~~LOC~~ SOPN: 60.0000 [IU] | PEN_INJECTOR | Freq: Every day | SUBCUTANEOUS | 3 refills | Status: DC

## 2019-04-27 MED ORDER — AMLODIPINE BESYLATE 5 MG PO TABS: 5.0000 mg | ORAL_TABLET | Freq: Every day | ORAL | 3 refills | Status: DC

## 2019-04-27 MED ORDER — HYDROCHLOROTHIAZIDE 25 MG PO TABS: 25.0000 mg | ORAL_TABLET | Freq: Every day | ORAL | 3 refills | Status: DC

## 2019-04-27 MED ORDER — ATORVASTATIN CALCIUM 40 MG PO TABS: 40.0000 mg | ORAL_TABLET | Freq: Every morning | ORAL | 3 refills | Status: DC

## 2019-04-27 MED ORDER — METFORMIN HCL 1000 MG PO TABS: 1000.0000 mg | ORAL_TABLET | Freq: Two times a day (BID) | ORAL | 3 refills | Status: DC

## 2019-04-27 MED ORDER — LOSARTAN POTASSIUM 100 MG PO TABS: 100.0000 mg | ORAL_TABLET | Freq: Every day | ORAL | 3 refills | Status: DC

## 2019-04-27 NOTE — Progress Notes (Signed)
BP (!) 178/81   Pulse 84   Temp 98 F (36.7 C) (Temporal)   Ht _0  (1.753 m)   Wt 189 lb 3.2 oz (85.8 kg)   SpO2 96%   BMI 27.94 kg/m    Subjective:    Patient ID: Randy [name], male    DOB: 04-15-1951, 68 y.o.   MRN: [id number]  HPI: Randy [name] is a 68 y.o. male presenting on 04/27/2019 for New Patient (Initial Visit) ([place]) and Establish Care   HPI Type 2 diabetes mellitus Patient comes in today for recheck of his diabetes. Patient has been currently taking lantus 60 and metformin. Patient is currently on an ACE inhibitor/ARB. Patient has not seen an ophthalmologist this year. Patient denies any issues with their feet.   Hyperlipidemia Patient is coming in for recheck of his hyperlipidemia. The patient is currently taking atorvastatin. They deny any issues with myalgias or history of liver damage from it. They deny any focal numbness or weakness or chest pain.   Hypertension Patient is currently on hctz and losartan, and their blood pressure today is 178/81. Patient denies any lightheadedness or dizziness. Patient denies headaches, blurred vision, chest pains, shortness of breath, or weakness. Denies any side effects from medication and is content with current medication.   Anxiety and depression Patient is currently using zoloft and feels like things are going well. Patient denies suicidal ideations or thoughts of hurting himself.  Relevant past medical, surgical, family and social history reviewed and updated as indicated. Interim medical history since our last visit reviewed. Allergies and medications reviewed and updated.  Review of Systems  Constitutional: Negative for chills and fever.  Eyes: Negative for visual disturbance.  Respiratory: Negative for shortness of breath and wheezing.   Cardiovascular: Negative for chest pain and leg swelling.  Musculoskeletal: Negative for back pain and gait problem.  Skin: Negative for rash.  Neurological: Negative  for dizziness, weakness and light-headedness.  All other systems reviewed and are negative.   Per HPI unless specifically indicated above  Social History   Socioeconomic History  . Marital status: Single    Spouse name: Not on file  . Number of children: Not on file  . Years of education: Not on file  . Highest education level: Not on file  Occupational History  . Not on file  Social Needs  . Financial resource strain: Not on file  . Food insecurity    Worry: Not on file    Inability: Not on file  . Transportation needs    Medical: Not on file    Non-medical: Not on file  Tobacco Use  . Smoking status: Current Every Day Smoker    Packs/day: 1.00    Years: 15.00    Pack years: 15.00    Types: Pipe  . Smokeless tobacco: Never Used  Substance and Sexual Activity  . Alcohol use: Never    Frequency: Never    Comment: rarely  . Drug use: No  . Sexual activity: Not on file  Lifestyle  . Physical activity    Days per week: Not on file    Minutes per session: Not on file  . Stress: Not on file  Relationships  . Social Herbalist on phone: Not on file    Gets together: Not on file    Attends religious service: Not on file    Active member of club or organization: Not on file    Attends meetings of  clubs or organizations: Not on file    Relationship status: Not on file  . Intimate partner violence    Fear of current or ex partner: Not on file    Emotionally abused: Not on file    Physically abused: Not on file    Forced sexual activity: Not on file  Other Topics Concern  . Not on file  Social History Narrative  . Not on file    Past Surgical History:  Procedure Laterality Date  . HEMORRHOID SURGERY N/A 06/08/2013   Procedure: HEMORRHOIDECTOMY;  Surgeon: Leighton Ruff, MD;  Location: [facility];  Service: General;  Laterality: N/A;  . NO PAST SURGERIES      History reviewed. No pertinent family history.  Allergies as of 04/27/2019    No Known Allergies     Medication List       Accurate as of April 27, 2019  4:05 PM. If you have any questions, ask your nurse or doctor.        STOP taking these medications   cilostazol 100 MG tablet Commonly known as: PLETAL Stopped by: Fransisca Kaufmann Dettinger, MD   psyllium 95 % Pack Commonly known as: HYDROCIL/METAMUCIL Stopped by: Fransisca Kaufmann Dettinger, MD     TAKE these medications   aspirin EC 81 MG tablet Take 81 mg by mouth daily.   atorvastatin 40 MG tablet Commonly known as: LIPITOR Take 40 mg by mouth every morning.   benazepril 40 MG tablet Commonly known as: LOTENSIN Take 40 mg by mouth daily.   hydrochlorothiazide 25 MG tablet Commonly known as: HYDRODIURIL TAKE ONE (1) TABLET EACH DAY   Insulin Pen Needle 31G X 8 MM Misc Commonly known as: B-D ULTRAFINE III SHORT PEN 1 each by Does not apply route as directed.   Lantus SoloStar 100 UNIT/ML Solostar Pen Generic drug: Insulin Glargine INJECT 60 UNITS SQ AT BEDTIME   losartan 100 MG tablet Commonly known as: COZAAR Take 100 mg by mouth daily.   metFORMIN 1000 MG tablet Commonly known as: GLUCOPHAGE Take 1,000 mg by mouth 2 (two) times daily with a meal.   Potassium Chloride ER 20 MEQ Tbcr Take 20 mEq by mouth daily.   sertraline 100 MG tablet Commonly known as: ZOLOFT Take 100 mg by mouth daily.          Objective:    BP (!) 190/84   Pulse 84   Temp 98 F (36.7 C) (Temporal)   Ht _0  (1.753 m)   Wt 189 lb 3.2 oz (85.8 kg)   SpO2 96%   BMI 27.94 kg/m   Wt Readings from Last 3 Encounters:  04/27/19 189 lb 3.2 oz (85.8 kg)  03/09/19 186 lb 4.8 oz (84.5 kg)  07/07/18 182 lb (82.6 kg)    Physical Exam Vitals signs and nursing note reviewed.  Constitutional:      General: He is not in acute distress.    Appearance: He is well-developed. He is not diaphoretic.  Eyes:     General: No scleral icterus.    Conjunctiva/sclera: Conjunctivae normal.  Neck:     Musculoskeletal: Neck  supple.     Thyroid: No thyromegaly.  Cardiovascular:     Rate and Rhythm: Normal rate and regular rhythm.     Heart sounds: Normal heart sounds. No murmur.  Pulmonary:     Effort: Pulmonary effort is normal. No respiratory distress.     Breath sounds: Normal breath sounds. No wheezing.  Musculoskeletal: Normal range  of motion.  Lymphadenopathy:     Cervical: No cervical adenopathy.  Skin:    General: Skin is warm and dry.     Findings: No rash.  Neurological:     Mental Status: He is alert and oriented to person, place, and time.     Coordination: Coordination normal.  Psychiatric:        Behavior: Behavior normal.         Assessment & Plan:   Problem List Items Addressed This Visit      Cardiovascular and Mediastinum   Essential hypertension, benign - Primary   Relevant Medications   hydrochlorothiazide (HYDRODIURIL) 25 MG tablet   losartan (COZAAR) 100 MG tablet   amLODipine (NORVASC) 5 MG tablet   atorvastatin (LIPITOR) 40 MG tablet   Other Relevant Orders   CBC with Differential/Platelet   CMP14+EGFR     Endocrine   Uncontrolled type 2 diabetes mellitus with complication, with long-term current use of insulin (HCC)   Relevant Medications   losartan (COZAAR) 100 MG tablet   atorvastatin (LIPITOR) 40 MG tablet   Insulin Glargine (LANTUS SOLOSTAR) 100 UNIT/ML Solostar Pen   metFORMIN (GLUCOPHAGE) 1000 MG tablet   Other Relevant Orders   hgba1c   CMP14+EGFR     Other   Mixed hyperlipidemia   Relevant Medications   hydrochlorothiazide (HYDRODIURIL) 25 MG tablet   losartan (COZAAR) 100 MG tablet   amLODipine (NORVASC) 5 MG tablet   atorvastatin (LIPITOR) 40 MG tablet   Other Relevant Orders   Lipid panel   Current smoker   Recurrent major depressive disorder, in full remission (HCC)   Relevant Medications   sertraline (ZOLOFT) 100 MG tablet   Other Relevant Orders   CBC with Differential/Platelet      Add amlodipine and continue other medication  currently Follow up plan: Return in about 3 months (around 07/28/2019), or if symptoms worsen or fail to improve, for diabetes and hypertension.  Caryl Pina, MD [facility] 04/27/2019, 4:05 PM

## 2019-04-28 LAB — LIPID PANEL
Chol/HDL Ratio: 3.9 ratio (ref 0.0–5.0)
Cholesterol, Total: 166 mg/dL (ref 100–199)
HDL: 43 mg/dL (ref >39)
LDL Chol Calc (NIH): 100 mg/dL — ABNORMAL HIGH (ref 0–99)
Triglycerides: 130 mg/dL (ref 0–149)
VLDL Cholesterol Cal: 23 mg/dL (ref 5–40)

## 2019-04-28 LAB — CMP14+EGFR
ALT: 18 IU/L (ref 0–44)
AST: 17 IU/L (ref 0–40)
Albumin/Globulin Ratio: 2 (ref 1.2–2.2)
Albumin: 4.4 g/dL (ref 3.8–4.8)
Alkaline Phosphatase: 75 IU/L (ref 39–117)
BUN/Creatinine Ratio: 21 (ref 10–24)
BUN: 19 mg/dL (ref 8–27)
Bilirubin Total: 0.3 mg/dL (ref 0.0–1.2)
CO2: 32 mmol/L — ABNORMAL HIGH (ref 20–29)
Calcium: 9.8 mg/dL (ref 8.6–10.2)
Chloride: 99 mmol/L (ref 96–106)
Creatinine, Ser: 0.9 mg/dL (ref 0.76–1.27)
GFR calc Af Amer: 102 mL/min/{1.73_m2} (ref >59)
GFR calc non Af Amer: 88 mL/min/{1.73_m2} (ref >59)
Globulin, Total: 2.2 g/dL (ref 1.5–4.5)
Glucose: 132 mg/dL — ABNORMAL HIGH (ref 65–99)
Potassium: 4.3 mmol/L (ref 3.5–5.2)
Sodium: 143 mmol/L (ref 134–144)
Total Protein: 6.6 g/dL (ref 6.0–8.5)

## 2019-04-28 LAB — CBC WITH DIFFERENTIAL/PLATELET
Basophils Absolute: 0.1 10*3/uL (ref 0.0–0.2)
Basos: 1 %
EOS (ABSOLUTE): 0.2 10*3/uL (ref 0.0–0.4)
Eos: 2 %
Hematocrit: 45.1 % (ref 37.5–51.0)
Hemoglobin: 15.9 g/dL (ref 13.0–17.7)
Immature Grans (Abs): 0 10*3/uL (ref 0.0–0.1)
Immature Granulocytes: 0 %
Lymphocytes Absolute: 1.7 10*3/uL (ref 0.7–3.1)
Lymphs: 20 %
MCH: 34.2 pg — ABNORMAL HIGH (ref 26.6–33.0)
MCHC: 35.3 g/dL (ref 31.5–35.7)
MCV: 97 fL (ref 79–97)
Monocytes Absolute: 0.6 10*3/uL (ref 0.1–0.9)
Monocytes: 7 %
Neutrophils Absolute: 6 10*3/uL (ref 1.4–7.0)
Neutrophils: 70 %
Platelets: 190 10*3/uL (ref 150–450)
RBC: 4.65 x10E6/uL (ref 4.14–5.80)
RDW: 12.6 % (ref 11.6–15.4)
WBC: 8.5 10*3/uL (ref 3.4–10.8)

## 2019-05-04 ENCOUNTER — Ambulatory Visit (INDEPENDENT_AMBULATORY_CARE_PROVIDER_SITE_OTHER): Payer: Medicare Other | Admitting: "Endocrinology

## 2019-05-04 ENCOUNTER — Encounter: Payer: Self-pay | Admitting: "Endocrinology

## 2019-05-04 ENCOUNTER — Other Ambulatory Visit: Payer: Self-pay

## 2019-05-04 DIAGNOSIS — E118 Type 2 diabetes mellitus with unspecified complications: Secondary | ICD-10-CM | POA: Diagnosis not present

## 2019-05-04 DIAGNOSIS — IMO0002 Reserved for concepts with insufficient information to code with codable children: Secondary | ICD-10-CM

## 2019-05-04 DIAGNOSIS — Z794 Long term (current) use of insulin: Secondary | ICD-10-CM

## 2019-05-04 DIAGNOSIS — I10 Essential (primary) hypertension: Secondary | ICD-10-CM | POA: Diagnosis not present

## 2019-05-04 DIAGNOSIS — E1165 Type 2 diabetes mellitus with hyperglycemia: Secondary | ICD-10-CM | POA: Diagnosis not present

## 2019-05-04 DIAGNOSIS — E782 Mixed hyperlipidemia: Secondary | ICD-10-CM

## 2019-05-04 NOTE — Progress Notes (Signed)
05/04/2019                                                     Endocrinology Telehealth Visit Follow up Note -During COVID -19 Pandemic  This visit type was conducted due to national recommendations for restrictions regarding the COVID-19 Pandemic  in an effort to limit this patient's exposure and mitigate transmission of the corona virus.  Due to his co-morbid illnesses, Randy [name] is at  moderate to high risk for complications without adequate follow up.  This format is felt to be most appropriate for him at this time.  I connected with this patient on 05/04/2019   by telephone and verified that I am speaking with the correct person using two identifiers. Randy [name], 1950/12/27. he has verbally consented to this visit. All issues noted in this document were discussed and addressed. The format was not optimal for physical exam.     Subjective:    Patient ID: Randy [name], male    DOB: Jul 03, 1950, PCP Dettinger, Fransisca Kaufmann, MD   Past Medical History:  Diagnosis Date  . Diabetes mellitus without complication ([place])   . Hyperlipidemia   . Hypertension    Past Surgical History:  Procedure Laterality Date  . HEMORRHOID SURGERY N/A 06/08/2013   Procedure: HEMORRHOIDECTOMY;  Surgeon: Leighton Ruff, MD;  Location: [facility];  Service: General;  Laterality: N/A;  . NO PAST SURGERIES     Social History   Socioeconomic History  . Marital status: Single    Spouse name: Not on file  . Number of children: Not on file  . Years of education: Not on file  . Highest education level: Not on file  Occupational History  . Not on file  Social Needs  . Financial resource strain: Not on file  . Food insecurity    Worry: Not on file    Inability: Not on file  . Transportation needs    Medical: Not on file    Non-medical: Not on file  Tobacco Use  . Smoking status: Current Every Day Smoker    Packs/day: 1.00    Years: 40.00    Pack years: 40.00    Types: Pipe   . Smokeless tobacco: Never Used  Substance and Sexual Activity  . Alcohol use: Never    Frequency: Never    Comment: rarely  . Drug use: No  . Sexual activity: Not Currently    Birth control/protection: None  Lifestyle  . Physical activity    Days per week: Not on file    Minutes per session: Not on file  . Stress: Not on file  Relationships  . Social Herbalist on phone: Not on file    Gets together: Not on file    Attends religious service: Not on file    Active member of club or organization: Not on file    Attends meetings of clubs or organizations: Not on file    Relationship status: Not on file  Other Topics Concern  . Not on file  Social History Narrative   Retired Software engineer   Outpatient Encounter Medications as of 05/04/2019  Medication Sig  . amLODipine (NORVASC) 5 MG tablet Take 1 tablet (5 mg total) by mouth daily.  Marland Kitchen aspirin EC 81 MG tablet Take 81 mg  by mouth daily.  Marland Kitchen atorvastatin (LIPITOR) 40 MG tablet Take 1 tablet (40 mg total) by mouth every morning.  . hydrochlorothiazide (HYDRODIURIL) 25 MG tablet Take 1 tablet (25 mg total) by mouth daily.  . Insulin Glargine (LANTUS SOLOSTAR) 100 UNIT/ML Solostar Pen Inject 60 Units into the skin at bedtime.  . Insulin Pen Needle (B-D ULTRAFINE III SHORT PEN) 31G X 8 MM MISC 1 each by Does not apply route as directed.  Marland Kitchen losartan (COZAAR) 100 MG tablet Take 1 tablet (100 mg total) by mouth daily.  . metFORMIN (GLUCOPHAGE) 1000 MG tablet Take 1 tablet (1,000 mg total) by mouth 2 (two) times daily with a meal.  . Potassium Chloride ER 20 MEQ TBCR Take 20 mEq by mouth daily.   . sertraline (ZOLOFT) 100 MG tablet Take 1 tablet (100 mg total) by mouth daily.   No facility-administered encounter medications on file as of 05/04/2019.    ALLERGIES: No Known Allergies VACCINATION STATUS: Immunization History  Administered Date(s) Administered  . Fluad Quad(high Dose 65+) 04/27/2019  . Influenza Split 06/09/2010   . Influenza, High Dose Seasonal PF 04/07/2017, 03/10/2018  . Influenza, Seasonal, Injecte, Preservative Fre 03/02/2014, 04/04/2015  . Influenza,inj,Quad PF,6+ Mos 03/02/2014  . Pneumococcal Polysaccharide-23 06/09/2005    Diabetes He presents for his follow-up diabetic visit. He has type 2 diabetes mellitus. Onset time: He was diagnosed at approximate age of 76 years. His disease course has been improving. There are no hypoglycemic associated symptoms. Pertinent negatives for hypoglycemia include no confusion, headaches, pallor or seizures. There are no diabetic associated symptoms. Pertinent negatives for diabetes include no chest pain, no fatigue, no polydipsia, no polyphagia, no polyuria and no weakness. There are no hypoglycemic complications. Symptoms are improving. There are no diabetic complications. Risk factors for coronary artery disease include dyslipidemia, diabetes mellitus, hypertension, sedentary lifestyle and male sex. Current diabetic treatment includes insulin injections and oral agent (monotherapy). He is compliant with treatment most of the time. His weight is fluctuating minimally. He is following a generally unhealthy diet. He rarely participates in exercise. His home blood glucose trend is decreasing steadily. His breakfast blood glucose range is generally 140-180 mg/dl. His overall blood glucose range is 140-180 mg/dl. An ACE inhibitor/angiotensin II receptor blocker is being taken.  Hyperlipidemia This is a chronic problem. The current episode started more than 1 year ago. Exacerbating diseases include diabetes and obesity. Pertinent negatives include no chest pain, myalgias or shortness of breath. Current antihyperlipidemic treatment includes statins. Compliance problems include adherence to diet.  Risk factors for coronary artery disease include diabetes mellitus, dyslipidemia, hypertension and male sex.  Hypertension This is a chronic problem. The current episode started more  than 1 year ago. Pertinent negatives include no chest pain, headaches, neck pain, palpitations or shortness of breath. Risk factors for coronary artery disease include dyslipidemia, diabetes mellitus, family history, male gender, sedentary lifestyle and smoking/tobacco exposure. Past treatments include ACE inhibitors. Compliance problems include diet and exercise.    Review of systems: Limited as above.   Objective:    There were no vitals taken for this visit.  Wt Readings from Last 3 Encounters:  04/27/19 189 lb 3.2 oz (85.8 kg)  03/09/19 186 lb 4.8 oz (84.5 kg)  07/07/18 182 lb (82.6 kg)     Results for orders placed or performed in visit on 04/27/19  hgba1c  Result Value Ref Range   HB A1C (BAYER DCA - WAIVED) 7.3 (H) <7.0 %  CBC with Differential/Platelet  Result Value Ref Range   WBC 8.5 3.4 - 10.8 x10E3/uL   RBC 4.65 4.14 - 5.80 x10E6/uL   Hemoglobin 15.9 13.0 - 17.7 g/dL   Hematocrit 45.1 37.5 - 51.0 %   MCV 97 79 - 97 fL   MCH 34.2 (H) 26.6 - 33.0 pg   MCHC 35.3 31.5 - 35.7 g/dL   RDW 12.6 11.6 - 15.4 %   Platelets 190 150 - 450 x10E3/uL   Neutrophils 70 Not Estab. %   Lymphs 20 Not Estab. %   Monocytes 7 Not Estab. %   Eos 2 Not Estab. %   Basos 1 Not Estab. %   Neutrophils Absolute 6.0 1.4 - 7.0 x10E3/uL   Lymphocytes Absolute 1.7 0.7 - 3.1 x10E3/uL   Monocytes Absolute 0.6 0.1 - 0.9 x10E3/uL   EOS (ABSOLUTE) 0.2 0.0 - 0.4 x10E3/uL   Basophils Absolute 0.1 0.0 - 0.2 x10E3/uL   Immature Granulocytes 0 Not Estab. %   Immature Grans (Abs) 0.0 0.0 - 0.1 x10E3/uL  CMP14+EGFR  Result Value Ref Range   Glucose 132 (H) 65 - 99 mg/dL   BUN 19 8 - 27 mg/dL   Creatinine, Ser 0.90 0.76 - 1.27 mg/dL   GFR calc non Af Amer 88 >59 mL/min/1.73   GFR calc Af Amer 102 >59 mL/min/1.73   BUN/Creatinine Ratio 21 10 - 24   Sodium 143 134 - 144 mmol/L   Potassium 4.3 3.5 - 5.2 mmol/L   Chloride 99 96 - 106 mmol/L   CO2 32 (H) 20 - 29 mmol/L   Calcium 9.8 8.6 - 10.2 mg/dL    Total Protein 6.6 6.0 - 8.5 g/dL   Albumin 4.4 3.8 - 4.8 g/dL   Globulin, Total 2.2 1.5 - 4.5 g/dL   Albumin/Globulin Ratio 2.0 1.2 - 2.2   Bilirubin Total 0.3 0.0 - 1.2 mg/dL   Alkaline Phosphatase 75 39 - 117 IU/L   AST 17 0 - 40 IU/L   ALT 18 0 - 44 IU/L  Lipid panel  Result Value Ref Range   Cholesterol, Total 166 100 - 199 mg/dL   Triglycerides 130 0 - 149 mg/dL   HDL 43 >39 mg/dL   VLDL Cholesterol Cal 23 5 - 40 mg/dL   LDL Chol Calc (NIH) 100 (H) 0 - 99 mg/dL   Chol/HDL Ratio 3.9 0.0 - 5.0 ratio   Complete Blood Count (Most recent): Lab Results  Component Value Date   WBC 8.5 04/27/2019   HGB 15.9 04/27/2019   HCT 45.1 04/27/2019   MCV 97 04/27/2019   PLT 190 04/27/2019   Chemistry (most recent): Lab Results  Component Value Date   NA 143 04/27/2019   K 4.3 04/27/2019   CL 99 04/27/2019   CO2 32 (H) 04/27/2019   BUN 19 04/27/2019   CREATININE 0.90 04/27/2019     Assessment & Plan:   1. Uncontrolled type 2 diabetes mellitus with complication, with long-term current use of insulin ([place])  -His previsit labs show improved A1c of 7.3%, progressively improving.  He has been no reports of hypoglycemia.  -  as a chronic heavy smoker he remains at extremely high risk for more acute and chronic complications of diabetes which include CAD, CVA, CKD, retinopathy, and neuropathy. These are all discussed in detail with the patient.  -  Recent labs reviewed.  - I have re-counseled the patient on diet management  by adopting a carbohydrate restricted / protein rich  Diet.  - he  admits there is a room for improvement in his diet and drink choices. -  Suggestion is made for him to avoid simple carbohydrates  from his diet including Cakes, Sweet Desserts / Pastries, Ice Cream, Soda (diet and regular), Sweet Tea, Candies, Chips, Cookies, Sweet Pastries,  Store Bought Juices, Alcohol in Excess of  1-2 drinks a day, Artificial Sweeteners, Coffee Creamer, and "Sugar-free" Products.  This will help patient to have stable blood glucose profile and potentially avoid unintended weight gain.   - Patient is advised to stick to a routine mealtimes to eat 3 meals  a day and avoid unnecessary snacks ( to snack only to correct hypoglycemia).  - I have approached the patient with the following individualized plan to manage diabetes and patient agrees.  -Based on his near target glycemic profile, he will not require prandial insulin for now.   -He is advised to continue Lantus 60 units nightly, continue to hold Humalog.  He will continue to monitor blood glucose 2 times a day-daily before breakfast and at bedtime.    -He is advised to continue Metformin 1000 mg by mouth twice a day after breakfast and supper.   -He will be considered for low-dose glipizide  on next visit if A1c increases to above 8%.  - Patient specific target  for A1c; LDL, HDL, Triglycerides, and  Waist Circumference were discussed in detail.  2) BP/HTN: he is advised to home monitor blood pressure and report if > 140/90 on 2 separate readings.  - He is advised to be  consistent taking his  hydrochlorothiazide  25 mg by mouth daily along with losartan 100 mg p.o. daily, he is extensively counseled against smoking.   3) Lipids/HPL: His recent lipid panel showed LDL controlled at 69.  He is advised to continue atorvastatin 40 mg p.o. nightly.    4) Chronic Care/Health Maintenance:  -Patient is on ACEI/ARB and Statin medications and encouraged to continue to follow up with Ophthalmology, Podiatrist at least yearly or according to recommendations, and advised to quit smoking  (he has resumed heavy smoking).  I have recommended yearly flu vaccine and pneumonia vaccination at least every 5 years; moderate intensity exercise for up to 150 minutes weekly; and  sleep for at least 7 hours a day.  - I advised patient to maintain close follow up with Dettinger, Fransisca Kaufmann, MD for primary care needs.  - Patient Care Time  Today:  25 min, of which >50% was spent in  counseling and the rest reviewing his  current and  previous labs/studies, previous treatments, his blood glucose readings, and medications' doses and developing a plan for long-term care based on the latest recommendations for standards of care.   Haskell Flirt Aloi participated in the discussions, expressed understanding, and voiced agreement with the above plans.  All questions were answered to his satisfaction. he is encouraged to contact clinic should he have any questions or concerns prior to his return visit.   Follow up plan: -Return in about 4 months (around 09/01/2019) for Include 8 log sheets, Bring Meter and Logs- A1c in Office.  Glade Lloyd, MD Phone: [phone]  Fax: [phone]   -  This note was partially dictated with voice recognition software. Similar sounding words can be transcribed inadequately or may not  be corrected upon review.  05/04/2019, 10:26 AM

## 2019-05-17 ENCOUNTER — Telehealth: Payer: Self-pay

## 2019-05-17 ENCOUNTER — Other Ambulatory Visit: Payer: Self-pay | Admitting: Family Medicine

## 2019-05-17 NOTE — Telephone Encounter (Signed)
Pt called requesting a prescription of pain medicine be sent in for pain that he is having in his 2 small toes. He said that this has been ongoing for a long time now.   Advised pt that in order to give pain meds he would need a visit to justify his need for them. Offered him an appt to be evaluated but he declined. He said that he had an appt coming up next month.   We discussed his smoking habits and the benefit that he would have from stopping - he said that he did not think that this was all the problem. Advised him that I was not trying to imply that it was, but ensure that it would definitely benefit him to stop.   Asked him to call back if he changes his mind and wants an earlier appt.   York Cerise, CMA

## 2019-06-14 ENCOUNTER — Other Ambulatory Visit: Payer: Self-pay

## 2019-06-14 DIAGNOSIS — I70213 Atherosclerosis of native arteries of extremities with intermittent claudication, bilateral legs: Secondary | ICD-10-CM

## 2019-06-15 ENCOUNTER — Ambulatory Visit (HOSPITAL_COMMUNITY)
Admission: RE | Admit: 2019-06-15 | Discharge: 2019-06-15 | Disposition: A | Discharge status: Home / Self Care | Payer: Medicare Other | Source: Ambulatory Visit | Attending: Family | Admitting: Family

## 2019-06-15 ENCOUNTER — Encounter: Payer: Self-pay | Admitting: Family

## 2019-06-15 ENCOUNTER — Other Ambulatory Visit: Payer: Self-pay

## 2019-06-15 ENCOUNTER — Ambulatory Visit: Payer: Medicare Other | Admitting: Family

## 2019-06-15 ENCOUNTER — Ambulatory Visit (INDEPENDENT_AMBULATORY_CARE_PROVIDER_SITE_OTHER)
Admission: RE | Admit: 2019-06-15 | Discharge: 2019-06-15 | Disposition: A | Discharge status: Home / Self Care | Payer: Medicare Other | Source: Ambulatory Visit | Attending: Family | Admitting: Family

## 2019-06-15 ENCOUNTER — Ambulatory Visit (INDEPENDENT_AMBULATORY_CARE_PROVIDER_SITE_OTHER): Payer: Medicare Other | Admitting: Family

## 2019-06-15 VITALS — BP 180/78 | Ht 69.0 in | Wt 186.0 lb

## 2019-06-15 DIAGNOSIS — F17200 Nicotine dependence, unspecified, uncomplicated: Secondary | ICD-10-CM

## 2019-06-15 DIAGNOSIS — I70213 Atherosclerosis of native arteries of extremities with intermittent claudication, bilateral legs: Secondary | ICD-10-CM | POA: Insufficient documentation

## 2019-06-15 DIAGNOSIS — F17210 Nicotine dependence, cigarettes, uncomplicated: Secondary | ICD-10-CM

## 2019-06-15 MED ORDER — CILOSTAZOL 100 MG PO TABS: ORAL_TABLET | ORAL | 0 refills | Status: DC

## 2019-06-15 NOTE — Progress Notes (Signed)
Virtual Visit via Telephone Note  I connected with Randy [name] on 06/15/2019 using the [employer] by telephone and verified that I was speaking with the correct person using two identifiers. Patient was located at his home and accompanied by himself. I am located at the [facility] office[facility]clinic.   The limitations of evaluation and management by telemedicine and the availability of in person appointments have been previously discussed with the patient and are documented in the patients chart. The patient expressed understanding and consented to proceed.  PCP: Dettinger, Fransisca Kaufmann, MD  Chief Complaint: follow up intermittent claudication  History of Present Illness: Randy [name] is a 69 y.o. male with a history of intermittent claudication.  Dr. Bridgett Larsson saw pt initially on 10-21-17.   He denies any change in claudication symptoms over the past 3 months. He is still able to walk 15 minutes before experiencing right calf cramping, if he continues to walk his left calf will cramp. This is relieved with rest.He denies any rest pain or active tissue changes.  He was taking Pletal, but stopped use about April 2020 as he states he noticed no benefit re his walking distance and claudication. However, he is requesting another prescription of Pletal, will send to his pharmacy.   He is being evaluated by Steffanie Rainwater, DPM.    Diabetic: Yes, 7.3 A1C on 04-27-19 (review of records) Tobacco Use: current smoker, 1 ppd, started in his 20's  Pt meds include: Statin :Yes Betablocker: No ASA: Yes Other anticoagulants/antiplatelets: no    Past Medical History:  Diagnosis Date  . Diabetes mellitus without complication ([place])   . Hyperlipidemia   . Hypertension     Past Surgical History:  Procedure Laterality Date  . HEMORRHOID SURGERY N/A 06/08/2013   Procedure: HEMORRHOIDECTOMY;  Surgeon: Leighton Ruff, MD;  Location: [facility];  Service: General;  Laterality: N/A;  . NO  PAST SURGERIES     Social History   Socioeconomic History  . Marital status: Single    Spouse name: Not on file  . Number of children: Not on file  . Years of education: Not on file  . Highest education level: Not on file  Occupational History  . Not on file  Tobacco Use  . Smoking status: Current Every Day Smoker    Packs/day: 1.00    Years: 40.00    Pack years: 40.00    Types: Pipe  . Smokeless tobacco: Never Used  . Tobacco comment: Less than 1 pk per day  Substance and Sexual Activity  . Alcohol use: Never    Comment: rarely  . Drug use: No  . Sexual activity: Not Currently    Birth control/protection: None  Other Topics Concern  . Not on file  Social History Narrative   Retired Software engineer   Social Determinants of Radio broadcast assistant Strain:   . Difficulty of Paying Living Expenses: Not on file  Food Insecurity:   . Worried About Charity fundraiser in the Last Year: Not on file  . Ran Out of Food in the Last Year: Not on file  Transportation Needs:   . Lack of Transportation (Medical): Not on file  . Lack of Transportation (Non-Medical): Not on file  Physical Activity:   . Days of Exercise per Week: Not on file  . Minutes of Exercise per Session: Not on file  Stress:   . Feeling of Stress : Not on file  Social Connections:   .  Frequency of Communication with Friends and Family: Not on file  . Frequency of Social Gatherings with Friends and Family: Not on file  . Attends Religious Services: Not on file  . Active Member of Clubs or Organizations: Not on file  . Attends Archivist Meetings: Not on file  . Marital Status: Not on file  Intimate Partner Violence:   . Fear of Current or Ex-Partner: Not on file  . Emotionally Abused: Not on file  . Physically Abused: Not on file  . Sexually Abused: Not on file     Current Meds  Medication Sig  . amLODipine (NORVASC) 5 MG tablet Take 1 tablet (5 mg total) by mouth daily.  Marland Kitchen aspirin EC 81 MG  tablet Take 81 mg by mouth daily.  Marland Kitchen atorvastatin (LIPITOR) 40 MG tablet Take 1 tablet (40 mg total) by mouth every morning.  . hydrochlorothiazide (HYDRODIURIL) 25 MG tablet Take 1 tablet (25 mg total) by mouth daily.  . Insulin Glargine (LANTUS SOLOSTAR) 100 UNIT/ML Solostar Pen Inject 60 Units into the skin at bedtime.  . Insulin Pen Needle (B-D ULTRAFINE III SHORT PEN) 31G X 8 MM MISC 1 each by Does not apply route as directed.  Marland Kitchen losartan (COZAAR) 100 MG tablet Take 1 tablet (100 mg total) by mouth daily.  . metFORMIN (GLUCOPHAGE) 1000 MG tablet Take 1 tablet (1,000 mg total) by mouth 2 (two) times daily with a meal.  . Potassium Chloride ER 20 MEQ TBCR Take 20 mEq by mouth daily.   . sertraline (ZOLOFT) 100 MG tablet Take 1 tablet (100 mg total) by mouth daily.  . [DISCONTINUED] cilostazol (PLETAL) 100 MG tablet TAKE 1 TABLET TWICE A DAY BEFORE A MEAL    12 system ROS was negative unless otherwise noted in HPI   Observations/Objective:  DATA  Bilateral LE Arterial Duplex (06-15-19): +----------+--------+-----+--------+----------+-------------------+ RIGHT     PSV cm/sRatioStenosisWaveform  Comments            +----------+--------+-----+--------+----------+-------------------+ CFA Distal140                  biphasic                      +----------+--------+-----+--------+----------+-------------------+ DFA       173                  biphasic                      +----------+--------+-----+--------+----------+-------------------+ SFA Prox  55                   monophasic                    +----------+--------+-----+--------+----------+-------------------+ SFA Mid   62                   monophasic                    +----------+--------+-----+--------+----------+-------------------+ SFA Distal             occluded          collateral observed +----------+--------+-----+--------+----------+-------------------+ POP Prox  0             occluded                              +----------+--------+-----+--------+----------+-------------------+ POP Distal26  monophasic                    +----------+--------+-----+--------+----------+-------------------+ ATA Distal9                    monophasic                    +----------+--------+-----+--------+----------+-------------------+ PTA Distal25                   monophasic                    +----------+--------+-----+--------+----------+-------------------+  A focal velocity elevation of was obtained at distal SFA. Findings are characteristic of occluded. A 2nd focal velocity elevation was visualized, measuring at proximal popliteal. Findings are characteristic of occluded.  +----------+--------+-----+---------------+----------+-------------------+ LEFT      PSV cm/sRatioStenosis       Waveform  Comments            +----------+--------+-----+---------------+----------+-------------------+ CFA Distal183                         biphasic                      +----------+--------+-----+---------------+----------+-------------------+ DFA       118                         biphasic                      +----------+--------+-----+---------------+----------+-------------------+ SFA Prox  85                          monophasic                    +----------+--------+-----+---------------+----------+-------------------+ SFA Mid   83                          monophasic                    +----------+--------+-----+---------------+----------+-------------------+ SFA Distal123                                   collateral observed +----------+--------+-----+---------------+----------+-------------------+ POP Prox               likely occluded          flow not observed   +----------+--------+-----+---------------+----------+-------------------+ POP Distal17                                                         +----------+--------+-----+---------------+----------+-------------------+ ATA Distal36                          monophasic                    +----------+--------+-----+---------------+----------+-------------------+ PTA Distal21                          monophasic                    +----------+--------+-----+---------------+----------+-------------------+ Summary:  Right: Occlusion noted in the distal superficial femoral artery. Diffuse calcification throughout. Left: Probable occlusion of the proximal popliteal artery; difficult to adequately visualize due to calcified plaqe with acoustic shadowing. Diffuse calcification throughout.   ABI Findings (06-15-19): +---------+------------------+-----+----------+--------+ Right    Rt Pressure (mmHg)IndexWaveform  Comment  +---------+------------------+-----+----------+--------+ Brachial 202                                       +---------+------------------+-----+----------+--------+ PTA      94                0.45 monophasic         +---------+------------------+-----+----------+--------+ DP       92                0.44 monophasic         +---------+------------------+-----+----------+--------+ Great Toe84                0.40                    +---------+------------------+-----+----------+--------+  +---------+------------------+-----+----------+-------+ Left     Lt Pressure (mmHg)IndexWaveform  Comment +---------+------------------+-----+----------+-------+ Brachial 208                                      +---------+------------------+-----+----------+-------+ PTA      122               0.59 monophasic        +---------+------------------+-----+----------+-------+ DP       130               0.62 monophasic        +---------+------------------+-----+----------+-------+ Great Toe95                0.46                    +---------+------------------+-----+----------+-------+  +-------+-----------+-----------+------------+------------+ ABI/TBIToday's ABIToday's TBIPrevious ABIPrevious TBI +-------+-----------+-----------+------------+------------+ Right  0.45       0.40       0.50        0.38         +-------+-----------+-----------+------------+------------+ Left   0.63       0.46       0.52        0.52         +-------+-----------+-----------+------------+------------+ Right ABI appear decreased, left ABI improved compared to prior study on 03/09/2019. Summary: Right: Resting right ankle-brachial index indicates severe right lower extremity arterial disease. The right toe-brachial index is abnormal. Left: Resting left ankle-brachial index indicates moderate left lower extremity arterial disease. The left toe-brachial index is abnormal.    Assessment and Plan: Randy [name] is a 69 y.o. male who presents with:  right calf intermittent claudication after walking 15 minutes, followed by left calf claudication if he continues walking, without evidence of critical limb ischemia. This has remained stable.   His atherosclerotic risk factors include almost in control DM and continued smoking.   Bilateral LE arterial duplex today shows what appears to be total occlusion in the distal right SFA and left popliteal artery, with collaterals distal to this.  ABI's today indicate severe disease in the right and moderate disease in the left, all monophasic waveforms; slight decline in the right, some improvement in the left.   He has not had an  arteriogram yet, this was discussed today; pt states he prefers to continue walking and efforts to quit smoking and continued improvement of his DM.   Pletal prescription was reordered at pt request.    Based on the patient's vascular studies and HPI, I have offered the patient: return in 3 months with ABI's.  Over 3 minutes was spent counseling  patient re smoking cessation, and patient was given several free resources re smoking cessation.  Gradually increase walking to total at least 30 minutes daily, stop when needed, resume walking when claudication pain subsides, and repeat, etc.   I discussed the assessment and treatment plan with the patient. The patient was provided an opportunity to ask questions and all were answered. The patient agreed with the plan and demonstrated an understanding of the instructions.   The patient was advised to call back or seek an in-person evaluation if the symptoms worsen or if the condition fails to improve as anticipated.  I spent 11 minutes with the patient via telephone encounter.   Gabrielle Dare Serine [facility] Office: [phone]  06/15/2019, 5:29 PM

## 2019-06-15 NOTE — Patient Instructions (Addendum)
Tobacco Use Disorder Tobacco use disorder (TUD) occurs when a person craves, seeks, and uses tobacco, regardless of the consequences. This disorder can cause problems with mental and physical health. It can affect your ability to have healthy relationships, and it can keep you from meeting your responsibilities at work, home, or school. Tobacco may be:  Smoked as a cigarette or cigar.  Inhaled using e-cigarettes.  Smoked in a pipe or hookah.  Chewed as smokeless tobacco.  Inhaled into the nostrils as snuff. Tobacco products contain a dangerous chemical called nicotine, which is very addictive. Nicotine triggers hormones that make the body feel stimulated and works on areas of the brain that make you feel good. These effects can make it hard for people to quit nicotine. Tobacco contains many other unsafe chemicals that can damage almost every organ in the body. Smoking tobacco also puts others in danger due to fire risk and possible health problems caused by breathing in secondhand smoke. What are the signs or symptoms? Symptoms of TUD may include:  Being unable to slow down or stop your tobacco use.  Spending an abnormal amount of time getting or using tobacco.  Craving tobacco. Cravings may last for up to 6 months after quitting.  Tobacco use that: ? Interferes with your work, school, or home life. ? Interferes with your personal and social relationships. ? Makes you give up activities that you once enjoyed or found important.  Using tobacco even though you know that it is: ? Dangerous or bad for your health or someone else's health. ? Causing problems in your life.  Needing more and more of the substance to get the same effect (developing tolerance).  Experiencing unpleasant symptoms if you do not use the substance (withdrawal). Withdrawal symptoms may include: ? Depressed, anxious, or irritable mood. ? Difficulty concentrating. ? Increased appetite. ? Restlessness or trouble  sleeping.  Using the substance to avoid withdrawal. How is this diagnosed? This condition may be diagnosed based on:  Your current and past tobacco use. Your health care provider may ask questions about how your tobacco use affects your life.  A physical exam. You may be diagnosed with TUD if you have at least two symptoms within a 12-month period. How is this treated? This condition is treated by stopping tobacco use. Many people are unable to quit on their own and need help. Treatment may include:  Nicotine replacement therapy (NRT). NRT provides nicotine without the other harmful chemicals in tobacco. NRT gradually lowers the dosage of nicotine in the body and reduces withdrawal symptoms. NRT is available as: ? Over-the-counter gums, lozenges, and skin patches. ? Prescription mouth inhalers and nasal sprays.  Medicine that acts on the brain to reduce cravings and withdrawal symptoms.  A type of talk therapy that examines your triggers for tobacco use, how to avoid them, and how to cope with cravings (behavioral therapy).  Hypnosis. This may help with withdrawal symptoms.  Joining a support group for others coping with TUD. The best treatment for TUD is usually a combination of medicine, talk therapy, and support groups. Recovery can be a long process. Many people start using tobacco again after stopping (relapse). If you relapse, it does not mean that treatment will not work. Follow these instructions at home:  Lifestyle  Do not use any products that contain nicotine or tobacco, such as cigarettes and e-cigarettes.  Avoid things that trigger tobacco use as much as you can. Triggers include people and situations that usually cause you   to use tobacco.  Avoid drinks that contain caffeine, including coffee. These may worsen some withdrawal symptoms.  Find ways to manage stress. Wanting to smoke may cause stress, and stress can make you want to smoke. Relaxation techniques such as  deep breathing, meditation, and yoga may help.  Attend support groups as needed. These groups are an important part of long-term recovery for many people. General instructions  Take over-the-counter and prescription medicines only as told by your health care provider.  Check with your health care provider before taking any new prescription or over-the-counter medicines.  Decide on a friend, family member, or smoking quit-line (such as 1-800-QUIT-NOW in the [place] that you can call or text when you feel the urge to smoke or when you need help coping with cravings.  Keep all follow-up visits as told by your health care provider and therapist. This is important. Contact a health care provider if:  You are not able to take your medicines as prescribed.  Your symptoms get worse, even with treatment. Summary  Tobacco use disorder (TUD) occurs when a person craves, seeks, and uses tobacco regardless of the consequences.  This condition may be diagnosed based on your current and past tobacco use and a physical exam.  Many people are unable to quit on their own and need help. Recovery can be a long process.  The most effective treatment for TUD is usually a combination of medicine, talk therapy, and support groups. This information is not intended to replace advice given to you by your health care provider. Make sure you discuss any questions you have with your health care provider. Document Revised: 05/13/2017 Document Reviewed: 05/13/2017 Elsevier Patient Education  2020 [employer].     Steps to Quit Smoking Smoking tobacco is the leading cause of preventable death. It can affect almost every organ in the body. Smoking puts you and people around you at risk for many serious, long-lasting (chronic) diseases. Quitting smoking can be hard, but it is one of the best things that you can do for your health. It is never too late to quit. How do I get ready to quit? When you decide to quit  smoking, make a plan to help you succeed. Before you quit:  Pick a date to quit. Set a date within the next 2 weeks to give you time to prepare.  Write down the reasons why you are quitting. Keep this list in places where you will see it often.  Tell your family, friends, and co-workers that you are quitting. Their support is important.  Talk with your doctor about the choices that may help you quit.  Find out if your health insurance will pay for these treatments.  Know the people, places, things, and activities that make you want to smoke (triggers). Avoid them. What first steps can I take to quit smoking?  Throw away all cigarettes at home, at work, and in your car.  Throw away the things that you use when you smoke, such as ashtrays and lighters.  Clean your car. Make sure to empty the ashtray.  Clean your home, including curtains and carpets. What can I do to help me quit smoking? Talk with your doctor about taking medicines and seeing a counselor at the same time. You are more likely to succeed when you do both.  If you are pregnant or breastfeeding, talk with your doctor about counseling or other ways to quit smoking. Do not take medicine to help you quit smoking  unless your doctor tells you to do so. To quit smoking: Quit right away  Quit smoking totally, instead of slowly cutting back on how much you smoke over a period of time.  Go to counseling. You are more likely to quit if you go to counseling sessions regularly. Take medicine You may take medicines to help you quit. Some medicines need a prescription, and some you can buy over-the-counter. Some medicines may contain a drug called nicotine to replace the nicotine in cigarettes. Medicines may:  Help you to stop having the desire to smoke (cravings).  Help to stop the problems that come when you stop smoking (withdrawal symptoms). Your doctor may ask you to use:  Nicotine patches, gum, or lozenges.  Nicotine  inhalers or sprays.  Non-nicotine medicine that is taken by mouth. Find resources Find resources and other ways to help you quit smoking and remain smoke-free after you quit. These resources are most helpful when you use them often. They include:  Online chats with a Social worker.  Phone quitlines.  [employer]ed Furn[employer] conservator/restorer.  Support groups or group counseling.  Text messaging programs.  Mobile phone apps. Use apps on your mobile phone or tablet that can help you stick to your quit plan. There are many free apps for mobile phones and tablets as well as websites. Examples include Quit Guide from the [employer] and smokefree.gov  What things can I do to make it easier to quit?   Talk to your family and friends. Ask them to support and encourage you.  Call a phone quitline (1-800-QUIT-NOW), reach out to support groups, or work with a Social worker.  Ask people who smoke to not smoke around you.  Avoid places that make you want to smoke, such as: ? Bars. ? Parties. ? Smoke-break areas at work.  Spend time with people who do not smoke.  Lower the stress in your life. Stress can make you want to smoke. Try these things to help your stress: ? Getting regular exercise. ? Doing deep-breathing exercises. ? Doing yoga. ? Meditating. ? Doing a body scan. To do this, close your eyes, focus on one area of your body at a time from head to toe. Notice which parts of your body are tense. Try to relax the muscles in those areas. How will I feel when I quit smoking? Day 1 to 3 weeks Within the first 24 hours, you may start to have some problems that come from quitting tobacco. These problems are very bad 2-3 days after you quit, but they do not often last for more than 2-3 weeks. You may get these symptoms:  Mood swings.  Feeling restless, nervous, angry, or annoyed.  Trouble concentrating.  Dizziness.  Strong desire for high-sugar foods and nicotine.  Weight gain.  Trouble pooping  (constipation).  Feeling like you may vomit (nausea).  Coughing or a sore throat.  Changes in how the medicines that you take for other issues work in your body.  Depression.  Trouble sleeping (insomnia). Week 3 and afterward After the first 2-3 weeks of quitting, you may start to notice more positive results, such as:  Better sense of smell and taste.  Less coughing and sore throat.  Slower heart rate.  Lower blood pressure.  Clearer skin.  Better breathing.  Fewer sick days. Quitting smoking can be hard. Do not give up if you fail the first time. Some people need to try a few times before they succeed. Do your best to stick to your  quit plan, and talk with your doctor if you have any questions or concerns. Summary  Smoking tobacco is the leading cause of preventable death. Quitting smoking can be hard, but it is one of the best things that you can do for your health.  When you decide to quit smoking, make a plan to help you succeed.  Quit smoking right away, not slowly over a period of time.  When you start quitting, seek help from your doctor, family, or friends. This information is not intended to replace advice given to you by your health care provider. Make sure you discuss any questions you have with your health care provider. Document Revised: 02/18/2019 Document Reviewed: 08/14/2018 Elsevier Patient Education  [employer].     Intermittent Claudication Intermittent claudication is pain in one or both legs that occurs when walking or exercising and goes away when resting. Intermittent claudication is a symptom of peripheral arterial disease (PAD). This condition is commonly treated with rest, medicine, and healthy lifestyle changes. If medical management does not improve symptoms, surgery can be done to restore blood flow (revascularization) to the affected leg. What are the causes?  This condition is caused by buildup of fatty material (plaque) within  the major arteries in the body (atherosclerosis). Plaque makes arteries stiff and narrow, which prevents enough blood from reaching the leg muscles. Pain occurs when you walk or exercise because your muscles need (but cannot get) more blood when you are moving and exercising. What increases the risk? The following factors may make you more likely to develop this condition:  A family history of atherosclerosis.  A personal history of stroke or heart disease.  Older age.  Being inactive (sedentary lifestyle).  Being overweight.  Smoking cigarettes.  Having another health condition such as: ? Diabetes. ? High blood pressure. ? High cholesterol. What are the signs or symptoms? Symptoms of this condition may first develop in the lower leg, and then they may spread to the thigh, hip, buttock, or the back of the lower leg (calf) over time. Symptoms may include:  Aches or pains.  Cramps.  A feeling of tightness, weakness, or heaviness.  A wound on the lower leg or foot that heals poorly or does not heal. How is this diagnosed? This condition may be diagnosed based on:  Your symptoms.  Your medical history.  Tests, such as: ? Blood tests. ? Arterial duplex ultrasound. This test uses images of blood vessels and surrounding organs to evaluate blood flow within arteries. ? Angiogram. In this procedure, dye is injected into arteries and then X-rays are taken. ? Magnetic resonance angiogram (MRA). In this procedure, strong magnets and radio waves are used instead of X-rays to create images of blood vessels and blood flow. ? CT angiogram (CTA). In this procedure, a large X-ray machine called a CT scanner takes detailed pictures of blood vessels that have been injected with dye. ? Ankle-brachial index (ABI) test. This procedure measures blood pressure in the leg during exercise and at rest. ? Exercise test. For this test, you will walk on a treadmill while tests are done (such as the ABI  test) to evaluate how this condition affects your ability to walk or exercise. How is this treated? Treatment for this condition may involve treatment for the underlying cause, such as treatment for high blood pressure, high cholesterol, or diabetes. Treatment may include:  Lifestyle changes such as: ? Starting a supervised or home-based exercise program. ? Losing weight. ? Quitting smoking.  Medicines to help restore blood flow through your legs.  Blood vessel surgery (angioplasty) to restore blood flow around the blocked vessel. This is also known as endovascular therapy (EVT). This is only done if your intermittent claudication is caused by severe peripheral artery disease, a condition in which blood flow is severely or totally restricted by the narrowing of the arteries. Follow these instructions at home: Lifestyle   Maintain a healthy weight.  Eat a diet that is low in saturated fats and calories. Consider working with a diet and nutrition specialist (dietitian) to help you make healthy food choices.  Do not use any products that contain nicotine or tobacco, such as cigarettes and e-cigarettes. If you need help quitting, ask your health care provider.  If your health care provider recommended an exercise program for you, follow it as directed. Your exercise program may involve: ? Walking 3 or more times a week. ? Walking until you have certain symptoms of intermittent claudication. ? Resting until symptoms go away. ? Gradually increasing your walking time to about 50 minutes a day. General instructions  Work with your health care provider to manage any other health conditions you may have, including diabetes, high blood pressure, or high cholesterol.  Take over-the-counter and prescription medicines only as told by your health care provider.  Keep all follow-up visits as told by your health care provider. This is important. Contact a health care provider if:  Your pain does  not go away with rest.  You have sores on your legs that do not heal or have a bad smell or pus coming from them.  Your condition gets worse or does not get better with treatment. Get help right away if:  You have chest pain.  You have difficulty breathing.  You develop arm weakness.  You have trouble speaking.  Your face begins to droop.  Your foot or leg is cold or it changes color.  Your foot or leg becomes numb. These symptoms may represent a serious problem that is an emergency. Do not wait to see if the symptoms will go away. Get medical help right away. Call your local emergency services (911 in the [place] Do not drive yourself to the hospital.  Summary  Intermittent claudication is pain in one or both legs that occurs when walking or exercising and goes away when resting.  This condition is caused by buildup of fatty material (plaque) within the major arteries in the body (atherosclerosis). Plaque makes arteries stiff and narrow, which prevents enough blood from reaching the leg muscles.  Intermittent claudication can be treated with medicine and lifestyle changes. If medical treatment fails, surgery can be done to help return blood flow to the affected area.  Make sure you work with your health care provider to manage any other health conditions you may have, including diabetes, high blood pressure, or high cholesterol. This information is not intended to replace advice given to you by your health care provider. Make sure you discuss any questions you have with your health care provider. Document Revised: 05/08/2017 Document Reviewed: 06/26/2016 Elsevier Patient Education  2020 [employer].

## 2019-06-20 ENCOUNTER — Other Ambulatory Visit: Payer: Self-pay | Admitting: *Deleted

## 2019-06-20 DIAGNOSIS — I70213 Atherosclerosis of native arteries of extremities with intermittent claudication, bilateral legs: Secondary | ICD-10-CM

## 2019-07-06 ENCOUNTER — Encounter: Payer: Self-pay | Admitting: Family Medicine

## 2019-07-06 ENCOUNTER — Ambulatory Visit (INDEPENDENT_AMBULATORY_CARE_PROVIDER_SITE_OTHER): Payer: Medicare Other | Admitting: Family Medicine

## 2019-07-06 VITALS — BP 173/95 | HR 92 | Temp 97.5°F | Ht 69.0 in | Wt 190.0 lb

## 2019-07-06 DIAGNOSIS — I10 Essential (primary) hypertension: Secondary | ICD-10-CM

## 2019-07-06 MED ORDER — AMLODIPINE BESYLATE 10 MG PO TABS: 10.0000 mg | ORAL_TABLET | Freq: Every day | ORAL | 2 refills | Status: DC

## 2019-07-06 NOTE — Patient Instructions (Signed)
DASH Eating Plan DASH stands for "Dietary Approaches to Stop Hypertension." The DASH eating plan is a healthy eating plan that has been shown to reduce high blood pressure (hypertension). It may also reduce your risk for type 2 diabetes, heart disease, and stroke. The DASH eating plan may also help with weight loss. What are tips for following this plan?  General guidelines  Avoid eating more than 2,300 mg (milligrams) of salt (sodium) a day. If you have hypertension, you may need to reduce your sodium intake to 1,500 mg a day.  Limit alcohol intake to no more than 1 drink a day for nonpregnant women and 2 drinks a day for men. One drink equals 12 oz of beer, 5 oz of wine, or 1 oz of hard liquor.  Work with your health care provider to maintain a healthy body weight or to lose weight. Ask what an ideal weight is for you.  Get at least 30 minutes of exercise that causes your heart to beat faster (aerobic exercise) most days of the week. Activities may include walking, swimming, or biking.  Work with your health care provider or diet and nutrition specialist (dietitian) to adjust your eating plan to your individual calorie needs. Reading food labels   Check food labels for the amount of sodium per serving. Choose foods with less than 5 percent of the Daily Value of sodium. Generally, foods with less than 300 mg of sodium per serving fit into this eating plan.  To find whole grains, look for the word "whole" as the first word in the ingredient list. Shopping  Buy products labeled as "low-sodium" or "no salt added."  Buy fresh foods. Avoid canned foods and premade or frozen meals. Cooking  Avoid adding salt when cooking. Use salt-free seasonings or herbs instead of table salt or sea salt. Check with your health care provider or pharmacist before using salt substitutes.  Do not fry foods. Cook foods using healthy methods such as baking, boiling, grilling, and broiling instead.  Cook with  heart-healthy oils, such as olive, canola, soybean, or sunflower oil. Meal planning  Eat a balanced diet that includes: ? 5 or more servings of fruits and vegetables each day. At each meal, try to fill half of your plate with fruits and vegetables. ? Up to 6-8 servings of whole grains each day. ? Less than 6 oz of lean meat, poultry, or fish each day. A 3-oz serving of meat is about the same size as a deck of cards. One egg equals 1 oz. ? 2 servings of low-fat dairy each day. ? A serving of nuts, seeds, or beans 5 times each week. ? Heart-healthy fats. Healthy fats called Omega-3 fatty acids are found in foods such as flaxseeds and coldwater fish, like sardines, salmon, and mackerel.  Limit how much you eat of the following: ? Canned or prepackaged foods. ? Food that is high in trans fat, such as fried foods. ? Food that is high in saturated fat, such as fatty meat. ? Sweets, desserts, sugary drinks, and other foods with added sugar. ? Full-fat dairy products.  Do not salt foods before eating.  Try to eat at least 2 vegetarian meals each week.  Eat more home-cooked food and less restaurant, buffet, and fast food.  When eating at a restaurant, ask that your food be prepared with less salt or no salt, if possible. What foods are recommended? The items listed may not be a complete list. Talk with your dietitian about   what dietary choices are best for you. Grains Whole-grain or whole-wheat bread. Whole-grain or whole-wheat pasta. Brown rice. Oatmeal. Quinoa. Bulgur. Whole-grain and low-sodium cereals. Pita bread. Low-fat, low-sodium crackers. Whole-wheat flour tortillas. Vegetables Fresh or frozen vegetables (raw, steamed, roasted, or grilled). Low-sodium or reduced-sodium tomato and vegetable juice. Low-sodium or reduced-sodium tomato sauce and tomato paste. Low-sodium or reduced-sodium canned vegetables. Fruits All fresh, dried, or frozen fruit. Canned fruit in natural juice (without  added sugar). Meat and other protein foods Skinless chicken or turkey. Ground chicken or turkey. Pork with fat trimmed off. Fish and seafood. Egg whites. Dried beans, peas, or lentils. Unsalted nuts, nut butters, and seeds. Unsalted canned beans. Lean cuts of beef with fat trimmed off. Low-sodium, lean deli meat. Dairy Low-fat (1%) or fat-free (skim) milk. Fat-free, low-fat, or reduced-fat cheeses. Nonfat, low-sodium ricotta or cottage cheese. Low-fat or nonfat yogurt. Low-fat, low-sodium cheese. Fats and oils Soft margarine without trans fats. Vegetable oil. Low-fat, reduced-fat, or light mayonnaise and salad dressings (reduced-sodium). Canola, safflower, olive, soybean, and sunflower oils. Avocado. Seasoning and other foods Herbs. Spices. Seasoning mixes without salt. Unsalted popcorn and pretzels. Fat-free sweets. What foods are not recommended? The items listed may not be a complete list. Talk with your dietitian about what dietary choices are best for you. Grains Baked goods made with fat, such as croissants, muffins, or some breads. Dry pasta or rice meal packs. Vegetables Creamed or fried vegetables. Vegetables in a cheese sauce. Regular canned vegetables (not low-sodium or reduced-sodium). Regular canned tomato sauce and paste (not low-sodium or reduced-sodium). Regular tomato and vegetable juice (not low-sodium or reduced-sodium). Pickles. Olives. Fruits Canned fruit in a light or heavy syrup. Fried fruit. Fruit in cream or butter sauce. Meat and other protein foods Fatty cuts of meat. Ribs. Fried meat. Bacon. Sausage. Bologna and other processed lunch meats. Salami. Fatback. Hotdogs. Bratwurst. Salted nuts and seeds. Canned beans with added salt. Canned or smoked fish. Whole eggs or egg yolks. Chicken or turkey with skin. Dairy Whole or 2% milk, cream, and half-and-half. Whole or full-fat cream cheese. Whole-fat or sweetened yogurt. Full-fat cheese. Nondairy creamers. Whipped toppings.  Processed cheese and cheese spreads. Fats and oils Butter. Stick margarine. Lard. Shortening. Ghee. Bacon fat. Tropical oils, such as coconut, palm kernel, or palm oil. Seasoning and other foods Salted popcorn and pretzels. Onion salt, garlic salt, seasoned salt, table salt, and sea salt. Worcestershire sauce. Tartar sauce. Barbecue sauce. Teriyaki sauce. Soy sauce, including reduced-sodium. Steak sauce. Canned and packaged gravies. Fish sauce. Oyster sauce. Cocktail sauce. Horseradish that you find on the shelf. Ketchup. Mustard. Meat flavorings and tenderizers. Bouillon cubes. Hot sauce and Tabasco sauce. Premade or packaged marinades. Premade or packaged taco seasonings. Relishes. Regular salad dressings. Where to find more information:  National Heart, Lung, and Blood Institute: www.nhlbi.nih.gov  American Heart Association: www.heart.org Summary  The DASH eating plan is a healthy eating plan that has been shown to reduce high blood pressure (hypertension). It may also reduce your risk for type 2 diabetes, heart disease, and stroke.  With the DASH eating plan, you should limit salt (sodium) intake to 2,300 mg a day. If you have hypertension, you may need to reduce your sodium intake to 1,500 mg a day.  When on the DASH eating plan, aim to eat more fresh fruits and vegetables, whole grains, lean proteins, low-fat dairy, and heart-healthy fats.  Work with your health care provider or diet and nutrition specialist (dietitian) to adjust your eating plan to your   individual calorie needs. This information is not intended to replace advice given to you by your health care provider. Make sure you discuss any questions you have with your health care provider. Document Revised: 05/08/2017 Document Reviewed: 05/19/2016 Elsevier Patient Education  2020 Elsevier Inc.  

## 2019-07-06 NOTE — Progress Notes (Signed)
Assessment & Plan:  1. Essential hypertension, benign - Uncontrolled on amlodipine 5 mg once daily, hydrochlorothiazide 25 mg once daily, and losartan 100 mg once daily.  I have increased his amlodipine from 5 mg to 10 mg once daily.  Encouraged exercise and low-salt diet.  Education provided on the DASH diet.  Advised patient to keep a log of his blood pressures and bring it with him to his next appointment. - amLODipine (NORVASC) 10 MG tablet; Take 1 tablet (10 mg total) by mouth daily.  Dispense: 30 tablet; Refill: 2   Follow up plan: Return as scheduled with PCP on 07/27/2019, for HTN.  Hendricks Limes, MSN, APRN, FNP-C [facility]  Subjective:   Patient ID: Randy [name], male    DOB: 11/15/1950, 69 y.o.   MRN: [id number]  HPI: Randy [name] is a 69 y.o. male presenting on 07/06/2019 for Hypertension (went to dentist this morning and it was 205/97. Patient states the amlodipine on his med list did not sound familar not sure if taking.)  Hypertension: Patient here at the request of his dentist office.  Patient was scheduled for procedure this morning but when he went in blood pressure was 205/97.  Patient reports he had taken his medications approximately 30 minutes prior to this BP reading.  He is not adherent to low salt diet.  Blood pressure is not well controlled at home. Cardiac symptoms none. Cardiovascular risk factors: advanced age (older than 62 for men, 12 for women), diabetes mellitus, dyslipidemia, hypertension, male gender, obesity (BMI >= 30 kg/m2), sedentary lifestyle and smoking/ tobacco exposure. Use of agents associated with hypertension: none. History of target organ damage: none.   ROS: Negative unless specifically indicated above in HPI.   Relevant past medical history reviewed and updated as indicated.   Allergies and medications reviewed and updated.   Current Outpatient Medications:  .  aspirin EC 81 MG tablet, Take 81 mg by mouth  daily., Disp: , Rfl:  .  atorvastatin (LIPITOR) 40 MG tablet, Take 1 tablet (40 mg total) by mouth every morning., Disp: 90 tablet, Rfl: 3 .  cilostazol (PLETAL) 100 MG tablet, TAKE 1 TABLET TWICE A DAY BEFORE A MEAL, Disp: 180 tablet, Rfl: 0 .  hydrochlorothiazide (HYDRODIURIL) 25 MG tablet, Take 1 tablet (25 mg total) by mouth daily., Disp: 90 tablet, Rfl: 3 .  Insulin Glargine (LANTUS SOLOSTAR) 100 UNIT/ML Solostar Pen, Inject 60 Units into the skin at bedtime., Disp: 15 mL, Rfl: 3 .  Insulin Pen Needle (B-D ULTRAFINE III SHORT PEN) 31G X 8 MM MISC, 1 each by Does not apply route as directed., Disp: 150 each, Rfl: 3 .  losartan (COZAAR) 100 MG tablet, Take 1 tablet (100 mg total) by mouth daily., Disp: 90 tablet, Rfl: 3 .  metFORMIN (GLUCOPHAGE) 1000 MG tablet, Take 1 tablet (1,000 mg total) by mouth 2 (two) times daily with a meal., Disp: 180 tablet, Rfl: 3 .  ONETOUCH VERIO test strip, , Disp: , Rfl:  .  Potassium Chloride ER 20 MEQ TBCR, Take 20 mEq by mouth daily. , Disp: , Rfl:  .  pravastatin (PRAVACHOL) 80 MG tablet, Take 80 mg by mouth daily., Disp: , Rfl:  .  sertraline (ZOLOFT) 100 MG tablet, Take 1 tablet (100 mg total) by mouth daily., Disp: 90 tablet, Rfl: 3 .  amLODipine (NORVASC) 10 MG tablet, Take 1 tablet (10 mg total) by mouth daily., Disp: 30 tablet, Rfl: 2  No Known Allergies  Objective:   BP (!) 173/95   Pulse 92   Temp (!) 97.5 F (36.4 C) (Temporal)   Ht 5\' 9"  (1.753 m)   Wt 190 lb (86.2 kg)   BMI 28.06 kg/m    Physical Exam Vitals reviewed.  Constitutional:      General: He is not in acute distress.    Appearance: Normal appearance. He is overweight. He is not ill-appearing, toxic-appearing or diaphoretic.  HENT:     Head: Normocephalic and atraumatic.  Eyes:     General: No scleral icterus.       Right eye: No discharge.        Left eye: No discharge.     Conjunctiva/sclera: Conjunctivae normal.  Cardiovascular:     Rate and Rhythm: Normal rate and  regular rhythm.     Heart sounds: Normal heart sounds. No murmur. No friction rub. No gallop.   Pulmonary:     Effort: Pulmonary effort is normal. No respiratory distress.     Breath sounds: Normal breath sounds. No stridor. No wheezing, rhonchi or rales.  Musculoskeletal:        General: Normal range of motion.     Cervical back: Normal range of motion.  Skin:    General: Skin is warm and dry.  Neurological:     Mental Status: He is alert and oriented to person, place, and time. Mental status is at baseline.  Psychiatric:        Mood and Affect: Mood normal.        Behavior: Behavior normal.        Thought Content: Thought content normal.        Judgment: Judgment normal.

## 2019-07-18 ENCOUNTER — Other Ambulatory Visit: Payer: Self-pay | Admitting: Family Medicine

## 2019-07-21 DIAGNOSIS — L84 Corns and callosities: Secondary | ICD-10-CM | POA: Diagnosis not present

## 2019-07-21 DIAGNOSIS — B351 Tinea unguium: Secondary | ICD-10-CM | POA: Diagnosis not present

## 2019-07-21 DIAGNOSIS — M79676 Pain in unspecified toe(s): Secondary | ICD-10-CM | POA: Diagnosis not present

## 2019-07-21 DIAGNOSIS — E1151 Type 2 diabetes mellitus with diabetic peripheral angiopathy without gangrene: Secondary | ICD-10-CM | POA: Diagnosis not present

## 2019-07-26 ENCOUNTER — Other Ambulatory Visit: Payer: Self-pay

## 2019-07-27 ENCOUNTER — Encounter: Payer: Self-pay | Admitting: Family Medicine

## 2019-07-27 ENCOUNTER — Ambulatory Visit (INDEPENDENT_AMBULATORY_CARE_PROVIDER_SITE_OTHER): Payer: Medicare Other | Admitting: Family Medicine

## 2019-07-27 VITALS — BP 133/72 | HR 117 | Temp 97.5°F | Ht 69.0 in | Wt 184.6 lb

## 2019-07-27 DIAGNOSIS — I10 Essential (primary) hypertension: Secondary | ICD-10-CM | POA: Diagnosis not present

## 2019-07-27 DIAGNOSIS — Z794 Long term (current) use of insulin: Secondary | ICD-10-CM

## 2019-07-27 DIAGNOSIS — E118 Type 2 diabetes mellitus with unspecified complications: Secondary | ICD-10-CM | POA: Diagnosis not present

## 2019-07-27 DIAGNOSIS — E782 Mixed hyperlipidemia: Secondary | ICD-10-CM | POA: Diagnosis not present

## 2019-07-27 DIAGNOSIS — E1165 Type 2 diabetes mellitus with hyperglycemia: Secondary | ICD-10-CM | POA: Diagnosis not present

## 2019-07-27 DIAGNOSIS — E1169 Type 2 diabetes mellitus with other specified complication: Secondary | ICD-10-CM

## 2019-07-27 LAB — BAYER DCA HB A1C WAIVED: HB A1C (BAYER DCA - WAIVED): 8.4 % — ABNORMAL HIGH (ref <7.0)

## 2019-07-27 NOTE — Progress Notes (Signed)
 BP 133/72   Pulse (!) 117   Temp (!) 97.5 F (36.4 C) (Temporal)   Ht 5' 9" (1.753 m)   Wt 184 lb 9.6 oz (83.7 kg)   SpO2 95%   BMI 27.26 kg/m    Subjective:   Patient ID: Randy [name], male    DOB: 04/24/1951, 68 y.o.   MRN: [id number]  HPI: Randy [name] is a 68 y.o. male presenting on 07/27/2019 for Diabetes (3 month follow up) and Hypertension   HPI Type 2 diabetes mellitus Patient comes in today for recheck of his diabetes. Patient has been currently taking Metformin and Lantus. Patient is currently on an ACE inhibitor/ARB. Patient has not seen an ophthalmologist this year. Patient denies any issues with their feet.   Hypertension Patient is currently on losartan and hydrochlorothiazide, and their blood pressure today is 133/72. Patient denies any lightheadedness or dizziness. Patient denies headaches, blurred vision, chest pains, shortness of breath, or weakness. Denies any side effects from medication and is content with current medication.   Hyperlipidemia Patient is coming in for recheck of his hyperlipidemia. The patient is currently taking pravastatin. They deny any issues with myalgias or history of liver damage from it. They deny any focal numbness or weakness or chest pain.   Relevant past medical, surgical, family and social history reviewed and updated as indicated. Interim medical history since our last visit reviewed. Allergies and medications reviewed and updated.  Review of Systems  Constitutional: Negative for chills and fever.  Respiratory: Negative for shortness of breath and wheezing.   Cardiovascular: Negative for chest pain and leg swelling.  Musculoskeletal: Negative for back pain and gait problem.  Skin: Negative for rash.  Neurological: Negative for dizziness, weakness and light-headedness.  All other systems reviewed and are negative.   Per HPI unless specifically indicated above   Allergies as of 07/27/2019   No Known Allergies     Medication List       Accurate as of July 27, 2019 11:25 AM. If you have any questions, ask your nurse or doctor.        amLODipine 10 MG tablet Commonly known as: NORVASC Take 1 tablet (10 mg total) by mouth daily.   aspirin EC 81 MG tablet Take 81 mg by mouth daily.   atorvastatin 40 MG tablet Commonly known as: LIPITOR Take 1 tablet (40 mg total) by mouth every morning.   cilostazol 100 MG tablet Commonly known as: PLETAL TAKE 1 TABLET TWICE A DAY BEFORE A MEAL   hydrochlorothiazide 25 MG tablet Commonly known as: HYDRODIURIL Take 1 tablet (25 mg total) by mouth daily.   Insulin Pen Needle 31G X 8 MM Misc Commonly known as: B-D ULTRAFINE III SHORT PEN 1 each by Does not apply route as directed.   Lantus SoloStar 100 UNIT/ML Solostar Pen Generic drug: Insulin Glargine Inject 60 Units into the skin at bedtime.   losartan 100 MG tablet Commonly known as: COZAAR Take 1 tablet (100 mg total) by mouth daily.   metFORMIN 1000 MG tablet Commonly known as: GLUCOPHAGE Take 1 tablet (1,000 mg total) by mouth 2 (two) times daily with a meal.   OneTouch Verio test strip Generic drug: glucose blood   Potassium Chloride ER 20 MEQ Tbcr Take 20 mEq by mouth daily.   pravastatin 80 MG tablet Commonly known as: PRAVACHOL Take 80 mg by mouth daily.   sertraline 100 MG tablet Commonly known as: ZOLOFT Take 1 tablet (100 mg total)   by mouth daily.        Objective:   BP 133/72   Pulse (!) 117   Temp (!) 97.5 F (36.4 C) (Temporal)   Ht 5' 9" (1.753 m)   Wt 184 lb 9.6 oz (83.7 kg)   SpO2 95%   BMI 27.26 kg/m   Wt Readings from Last 3 Encounters:  07/27/19 184 lb 9.6 oz (83.7 kg)  07/06/19 190 lb (86.2 kg)  06/15/19 186 lb (84.4 kg)    Physical Exam Vitals and nursing note reviewed.  Constitutional:      General: He is not in acute distress.    Appearance: He is well-developed. He is not diaphoretic.  Eyes:     General: No scleral icterus.        Right eye: No discharge.     Conjunctiva/sclera: Conjunctivae normal.     Pupils: Pupils are equal, round, and reactive to light.  Neck:     Thyroid: No thyromegaly.  Cardiovascular:     Rate and Rhythm: Normal rate and regular rhythm.     Heart sounds: Normal heart sounds. No murmur.  Pulmonary:     Effort: Pulmonary effort is normal. No respiratory distress.     Breath sounds: Normal breath sounds. No wheezing.  Musculoskeletal:        General: Normal range of motion.     Cervical back: Neck supple.  Lymphadenopathy:     Cervical: No cervical adenopathy.  Skin:    General: Skin is warm and dry.     Findings: No rash.  Neurological:     Mental Status: He is alert and oriented to person, place, and time.     Coordination: Coordination normal.  Psychiatric:        Behavior: Behavior normal.    Diabetic Foot Exam - Simple   Simple Foot Form Diabetic Foot exam was performed with the following findings: Yes 07/27/2019 11:24 AM  Visual Inspection No deformities, no ulcerations, no other skin breakdown bilaterally: Yes Sensation Testing Intact to touch and monofilament testing bilaterally: Yes Pulse Check Posterior Tibialis and Dorsalis pulse intact bilaterally: Yes Comments Onychomycosis but otherwise normal       Assessment & Plan:   Problem List Items Addressed This Visit      Cardiovascular and Mediastinum   Essential hypertension, benign     Endocrine   Controlled type 2 diabetes mellitus ([place]) - Primary   Relevant Orders   Bayer DCA Hb A1c Waived (Completed)   BMP8+EGFR (Completed)     Other   Mixed hyperlipidemia    Continue current medication, will check A1c today and blood work.  No changes for today and see back in 3 months  Patient feels like his blood sugars are doing better and says his get in the 120s 130s.  Follow up plan: Return in about 3 months (around 10/24/2019), or if symptoms worsen or fail to improve, for Diabetes recheck.  Counseling  provided for all of the vaccine components Orders Placed This Encounter  Procedures  . Bayer DCA Hb A1c Waived  . BMP8+EGFR    Caryl Pina, MD [facility] 07/27/2019, 11:25 AM

## 2019-07-28 LAB — BMP8+EGFR
BUN/Creatinine Ratio: 15 (ref 10–24)
BUN: 19 mg/dL (ref 8–27)
CO2: 26 mmol/L (ref 20–29)
Calcium: 9.6 mg/dL (ref 8.6–10.2)
Chloride: 96 mmol/L (ref 96–106)
Creatinine, Ser: 1.25 mg/dL (ref 0.76–1.27)
GFR calc Af Amer: 68 mL/min/{1.73_m2} (ref >59)
GFR calc non Af Amer: 59 mL/min/{1.73_m2} — ABNORMAL LOW (ref >59)
Glucose: 150 mg/dL — ABNORMAL HIGH (ref 65–99)
Potassium: 3.6 mmol/L (ref 3.5–5.2)
Sodium: 139 mmol/L (ref 134–144)

## 2019-07-29 ENCOUNTER — Other Ambulatory Visit: Payer: Self-pay | Admitting: Family Medicine

## 2019-07-29 DIAGNOSIS — Z794 Long term (current) use of insulin: Secondary | ICD-10-CM

## 2019-07-29 DIAGNOSIS — E1169 Type 2 diabetes mellitus with other specified complication: Secondary | ICD-10-CM

## 2019-07-29 MED ORDER — OZEMPIC (0.25 OR 0.5 MG/DOSE) 2 MG/1.5ML ~~LOC~~ SOPN: 0.5000 mg | PEN_INJECTOR | SUBCUTANEOUS | 3 refills | Status: DC

## 2019-08-09 ENCOUNTER — Ambulatory Visit (INDEPENDENT_AMBULATORY_CARE_PROVIDER_SITE_OTHER): Payer: Medicare Other | Admitting: *Deleted

## 2019-08-09 VITALS — Ht 69.0 in | Wt 184.5 lb

## 2019-08-09 DIAGNOSIS — Z0000 Encounter for general adult medical examination without abnormal findings: Secondary | ICD-10-CM | POA: Diagnosis not present

## 2019-08-09 NOTE — Patient Instructions (Signed)
  Mr. Noftz , Thank you for taking time to come for your Medicare Wellness Visit. I appreciate your ongoing commitment to your health goals. Please review the following plan we discussed and let me know if I can assist you in the future.   These are the goals we discussed: Goals    . Exercise 3x per week (30 min per time)     Try to exercise for at least 30 minutes, 3 times weekly       This is a list of the screening recommended for you and due dates:  Health Maintenance  Topic Date Due  . Eye exam for diabetics  01/06/2016  . Colon Cancer Screening  04/26/2020*  . Tetanus Vaccine  04/26/2020*  .  Hepatitis C: One time screening is recommended by Center for Disease Control  (CDC) for  adults born from [birth year] through 1964.   04/26/2020*  . Pneumonia vaccines (1 of 2 - PCV13) 04/26/2020*  . Hemoglobin A1C  01/24/2020  . Complete foot exam   07/26/2020  . Flu Shot  Completed  *Topic was postponed. The date shown is not the original due date.

## 2019-08-09 NOTE — Progress Notes (Signed)
MEDICARE ANNUAL WELLNESS VISIT  08/09/2019  Telephone Visit Disclaimer This Medicare AWV was conducted by telephone due to national recommendations for restrictions regarding the COVID-19 Pandemic (e.g. social distancing).  I verified, using two identifiers, that I am speaking with Randy [name] or their authorized healthcare agent. I discussed the limitations, risks, security, and privacy concerns of performing an evaluation and management service by telephone and the potential availability of an in-person appointment in the future. The patient expressed understanding and agreed to proceed.   Subjective:  Randy [name] is a 69 y.o. male patient of Dettinger, Fransisca Kaufmann, MD who had a Medicare Annual Wellness Visit today via telephone. Dennie is Retired and lives alone. he has 0 children. he reports that he is socially active and does interact with friends/family regularly. he is minimally physically active and enjoys fishing.  Patient Care Team: Dettinger, Fransisca Kaufmann, MD as PCP - General (Family Medicine)  Advanced Directives 08/09/2019 05/12/2018 10/21/2017 10/20/2016 06/08/2013 06/08/2013 05/30/2013  Does Patient Have a Medical Advance Directive? No No No No Patient does not have advance directive;Patient would not like information Patient does not have advance directive;Patient would not like information Patient does not have advance directive;Patient would not like information  Would patient like information on creating a medical advance directive? No - Patient declined No - Patient declined Yes (MAU/Ambulatory/Procedural Areas - Information given) - - - -  Pre-existing out of facility DNR order (yellow form or pink MOST form) - - - - No - -    Hospital Utilization Over the Past 12 Months: # of hospitalizations or ER visits: 0 # of surgeries: 0  Review of Systems    Patient reports that his overall health is unchanged compared to last year.  History obtained from chart review and the  patient General ROS: negative  Patient Reported Readings (BP, Pulse, CBG, Weight, etc) none  Pain Assessment Pain : No/denies pain     Current Medications & Allergies (verified) Allergies as of 08/09/2019   No Known Allergies     Medication List       Accurate as of August 09, 2019  2:11 PM. If you have any questions, ask your nurse or doctor.        amLODipine 10 MG tablet Commonly known as: NORVASC Take 1 tablet (10 mg total) by mouth daily.   aspirin EC 81 MG tablet Take 81 mg by mouth daily.   cilostazol 100 MG tablet Commonly known as: PLETAL TAKE 1 TABLET TWICE A DAY BEFORE A MEAL   hydrochlorothiazide 25 MG tablet Commonly known as: HYDRODIURIL Take 1 tablet (25 mg total) by mouth daily.   Insulin Pen Needle 31G X 8 MM Misc Commonly known as: B-D ULTRAFINE III SHORT PEN 1 each by Does not apply route as directed.   Lantus SoloStar 100 UNIT/ML Solostar Pen Generic drug: Insulin Glargine Inject 60 Units into the skin at bedtime.   losartan 100 MG tablet Commonly known as: COZAAR Take 1 tablet (100 mg total) by mouth daily.   metFORMIN 1000 MG tablet Commonly known as: GLUCOPHAGE Take 1 tablet (1,000 mg total) by mouth 2 (two) times daily with a meal.   OneTouch Verio test strip Generic drug: glucose blood   Ozempic (0.25 or 0.5 MG/DOSE) 2 MG/1.5ML Sopn Generic drug: Semaglutide(0.25 or 0.5MG /DOS) Inject 0.5 mg into the skin once a week.   Potassium Chloride ER 20 MEQ Tbcr Take 20 mEq by mouth daily.   pravastatin 80  MG tablet Commonly known as: PRAVACHOL Take 80 mg by mouth daily.   sertraline 100 MG tablet Commonly known as: ZOLOFT Take 1 tablet (100 mg total) by mouth daily.       History (reviewed): Past Medical History:  Diagnosis Date  . Diabetes mellitus without complication ([place])   . Hyperlipidemia   . Hypertension    Past Surgical History:  Procedure Laterality Date  . HEMORRHOID SURGERY N/A 06/08/2013   Procedure:  HEMORRHOIDECTOMY;  Surgeon: Leighton Ruff, MD;  Location: [facility];  Service: General;  Laterality: N/A;  . NO PAST SURGERIES     History reviewed. No pertinent family history. Social History   Socioeconomic History  . Marital status: Single    Spouse name: Not on file  . Number of children: 0  . Years of education: 53  . Highest education level: 12th grade  Occupational History  . Occupation: Retired  Tobacco Use  . Smoking status: Current Every Day Smoker    Packs/day: 1.00    Years: 40.00    Pack years: 40.00    Types: Pipe  . Smokeless tobacco: Never Used  . Tobacco comment: Less than 1 pk per day  Substance and Sexual Activity  . Alcohol use: Never    Comment: rarely  . Drug use: No  . Sexual activity: Not Currently    Birth control/protection: None  Other Topics Concern  . Not on file  Social History Narrative   Retired Software engineer   Social Determinants of Radio broadcast assistant Strain: [place]   . Difficulty of Paying Living Expenses: Not hard at all  Food Insecurity: No Food Insecurity  . Worried About Charity fundraiser in the Last Year: Never true  . Ran Out of Food in the Last Year: Never true  Transportation Needs: No Transportation Needs  . Lack of Transportation (Medical): No  . Lack of Transportation (Non-Medical): No  Physical Activity: Inactive  . Days of Exercise per Week: 0 days  . Minutes of Exercise per Session: 0 min  Stress: No Stress Concern Present  . Feeling of Stress : Not at all  Social Connections: Slightly Isolated  . Frequency of Communication with Friends and Family: More than three times a week  . Frequency of Social Gatherings with Friends and Family: More than three times a week  . Attends Religious Services: More than 4 times per year  . Active Member of Clubs or Organizations: Yes  . Attends Archivist Meetings: More than 4 times per year  . Marital Status: Never married    Activities of Daily  Living In your present state of health, do you have any difficulty performing the following activities: 08/09/2019  Hearing? N  Vision? N  Difficulty concentrating or making decisions? N  Walking or climbing stairs? N  Dressing or bathing? N  Doing errands, shopping? N  Preparing Food and eating ? N  Using the Toilet? N  In the past six months, have you accidently leaked urine? N  Do you have problems with loss of bowel control? N  Managing your Medications? N  Managing your Finances? N  Housekeeping or managing your Housekeeping? N  Some recent data might be hidden    Patient Education/ Literacy How often do you need to have someone help you when you read instructions, pamphlets, or other written materials from your doctor or pharmacy?: 1 - Never What is the last grade level you completed in school?: 12th  Grade  Exercise Current Exercise Habits: The patient does not participate in regular exercise at present, Exercise limited by: None identified  Diet Patient reports consuming 3 meals a day and 0 snack(s) a day Patient reports that his primary diet is: Regular Patient reports that she does have regular access to food.   Depression Screen PHQ 2/9 Scores 08/09/2019 07/27/2019 07/06/2019 04/27/2019 12/02/2017 07/29/2017 04/22/2017  PHQ - 2 Score 0 0 0 0 0 0 0     Fall Risk Fall Risk  08/09/2019 07/27/2019 07/06/2019 04/27/2019 12/02/2017  Falls in the past year? 0 0 0 0 No  Number falls in past yr: 0 - - - -  Injury with Fall? 0 - - - -  Risk for fall due to : No Fall Risks - - - -  Follow up Falls evaluation completed - - - -     Objective:  Haskell Flirt Polidori seemed alert and oriented and he participated appropriately during our telephone visit.  Blood Pressure Weight BMI  BP Readings from Last 3 Encounters:  07/27/19 133/72  07/06/19 (!) 173/95  06/15/19 (!) 180/78   Wt Readings from Last 3 Encounters:  08/09/19 184 lb 8.4 oz (83.7 kg)  07/27/19 184 lb 9.6 oz (83.7 kg)    07/06/19 190 lb (86.2 kg)   BMI Readings from Last 1 Encounters:  08/09/19 27.25 kg/m    *Unable to obtain current vital signs, weight, and BMI due to telephone visit type  Hearing/Vision  . Seaborn did not seem to have difficulty with hearing/understanding during the telephone conversation . Reports that he has not had a formal eye exam by an eye care professional within the past year . Reports that he has not had a formal hearing evaluation within the past year *Unable to fully assess hearing and vision during telephone visit type  Cognitive Function: 6CIT Screen 08/09/2019  What Year? 0 points  What month? 0 points  What time? 0 points  Count back from 20 0 points  Months in reverse 0 points  Repeat phrase 0 points  Total Score 0   (Normal:0-7, Significant for Dysfunction: >8)  Normal Cognitive Function Screening: Yes   Immunization & Health Maintenance Record Immunization History  Administered Date(s) Administered  . Fluad Quad(high Dose 65+) 04/27/2019  . Influenza Split 06/09/2010  . Influenza, High Dose Seasonal PF 04/07/2017, 03/10/2018  . Influenza, Seasonal, Injecte, Preservative Fre 03/02/2014, 04/04/2015  . Influenza,inj,Quad PF,6+ Mos 03/02/2014  . Pneumococcal Polysaccharide-23 06/09/2005    Health Maintenance  Topic Date Due  . OPHTHALMOLOGY EXAM  01/06/2016  . COLONOSCOPY  04/26/2020 (Originally 07/18/2013)  . TETANUS/TDAP  04/26/2020 (Originally 03/31/1971)  . Hepatitis C Screening  04/26/2020 (Originally 1950-10-16)  . PNA vac Low Risk Adult (1 of 2 - PCV13) 04/26/2020 (Originally 03/30/2017)  . HEMOGLOBIN A1C  01/24/2020  . FOOT EXAM  07/26/2020  . INFLUENZA VACCINE  Completed       Assessment  This is a routine wellness examination for Renold [address] [place].  Health Maintenance: Due or Overdue Health Maintenance Due  Topic Date Due  . OPHTHALMOLOGY EXAM  01/06/2016    Haskell Flirt Quebedeaux does not need a referral for Community Assistance: Care  Management:   no Social Work:    no Prescription Assistance:  no Nutrition/Diabetes Education:  no   Plan:  Personalized Goals Goals Addressed            This Visit's Progress   . Exercise 3x per week (30 min per  time)       Try to exercise for at least 30 minutes, 3 times weekly      Personalized Health Maintenance & Screening Recommendations  Pneumococcal vaccine  Td vaccine Colorectal cancer screening Glaucoma screening Smoking cessation counseling Advanced directives: has NO advanced directive  - add't info requested. Referral to SW: no  Lung Cancer Screening Recommended: no (Low Dose CT Chest recommended if Age 14-80 years, 30 pack-year currently smoking OR have quit w/in past 15 years) Hepatitis C Screening recommended: yes HIV Screening recommended: no  Advanced Directives: Written information was not prepared per patient's request.  Referrals & Orders No orders of the defined types were placed in this encounter.   Follow-up Plan . Follow-up with Dettinger, Fransisca Kaufmann, MD as planned   I have personally reviewed and noted the following in the patient's chart:   . Medical and social history . Use of alcohol, tobacco or illicit drugs  . Current medications and supplements . Functional ability and status . Nutritional status . Physical activity . Advanced directives . List of other physicians . Hospitalizations, surgeries, and ER visits in previous 12 months . Vitals . Screenings to include cognitive, depression, and falls . Referrals and appointments  In addition, I have reviewed and discussed with Haskell Flirt Dobie certain preventive protocols, quality metrics, and best practice recommendations. A written personalized care plan for preventive services as well as general preventive health recommendations is available and can be mailed to the patient at his request.      Wardell Heath, LPN  [id number]

## 2019-08-31 ENCOUNTER — Other Ambulatory Visit: Payer: Self-pay | Admitting: "Endocrinology

## 2019-08-31 ENCOUNTER — Other Ambulatory Visit: Payer: Self-pay | Admitting: Family Medicine

## 2019-08-31 DIAGNOSIS — I10 Essential (primary) hypertension: Secondary | ICD-10-CM

## 2019-09-01 ENCOUNTER — Ambulatory Visit: Payer: Medicare Other | Admitting: "Endocrinology

## 2019-10-19 ENCOUNTER — Other Ambulatory Visit: Payer: Self-pay | Admitting: "Endocrinology

## 2019-10-19 ENCOUNTER — Other Ambulatory Visit: Payer: Self-pay | Admitting: Family Medicine

## 2019-10-20 DIAGNOSIS — E1151 Type 2 diabetes mellitus with diabetic peripheral angiopathy without gangrene: Secondary | ICD-10-CM | POA: Diagnosis not present

## 2019-10-20 DIAGNOSIS — B351 Tinea unguium: Secondary | ICD-10-CM | POA: Diagnosis not present

## 2019-10-20 DIAGNOSIS — M79676 Pain in unspecified toe(s): Secondary | ICD-10-CM | POA: Diagnosis not present

## 2019-10-20 DIAGNOSIS — L84 Corns and callosities: Secondary | ICD-10-CM | POA: Diagnosis not present

## 2019-10-26 ENCOUNTER — Ambulatory Visit (INDEPENDENT_AMBULATORY_CARE_PROVIDER_SITE_OTHER): Payer: Medicare Other | Admitting: Family Medicine

## 2019-10-26 ENCOUNTER — Other Ambulatory Visit: Payer: Self-pay

## 2019-10-26 ENCOUNTER — Encounter: Payer: Self-pay | Admitting: Family Medicine

## 2019-10-26 VITALS — BP 177/61 | HR 71 | Temp 97.6°F | Ht 69.0 in | Wt 182.2 lb

## 2019-10-26 DIAGNOSIS — Z794 Long term (current) use of insulin: Secondary | ICD-10-CM | POA: Diagnosis not present

## 2019-10-26 DIAGNOSIS — I70213 Atherosclerosis of native arteries of extremities with intermittent claudication, bilateral legs: Secondary | ICD-10-CM | POA: Diagnosis not present

## 2019-10-26 DIAGNOSIS — I10 Essential (primary) hypertension: Secondary | ICD-10-CM | POA: Diagnosis not present

## 2019-10-26 DIAGNOSIS — E782 Mixed hyperlipidemia: Secondary | ICD-10-CM

## 2019-10-26 DIAGNOSIS — E1169 Type 2 diabetes mellitus with other specified complication: Secondary | ICD-10-CM | POA: Diagnosis not present

## 2019-10-26 DIAGNOSIS — F3342 Major depressive disorder, recurrent, in full remission: Secondary | ICD-10-CM

## 2019-10-26 LAB — BAYER DCA HB A1C WAIVED: HB A1C (BAYER DCA - WAIVED): 8.1 % — ABNORMAL HIGH (ref <7.0)

## 2019-10-26 MED ORDER — JARDIANCE 10 MG PO TABS: 10.0000 mg | ORAL_TABLET | Freq: Every day | ORAL | 5 refills | Status: DC

## 2019-10-26 NOTE — Progress Notes (Signed)
BP (!) 177/61   Pulse 71   Temp 97.6 F (36.4 C) (Temporal)   Ht 5' 9" (1.753 m)   Wt 182 lb 4 oz (82.7 kg)   BMI 26.91 kg/m    Subjective:   Patient ID: Randy [name], male    DOB: 17-Nov-1950, 69 y.o.   MRN: [id number]  HPI: Randy [name] is a 69 y.o. male presenting on 10/26/2019 for No chief complaint on file.   HPI Type 2 diabetes mellitus Patient comes in today for recheck of his diabetes. Patient has been currently taking Lantus 60 units and Metformin 1000 twice daily, was unable to afford or start Ozempic, never got it.. Patient is currently on an ACE inhibitor/ARB. Patient has not seen an ophthalmologist this year. Patient denies any issues with their feet. The symptom started onset as an adult hypertension and cholesterol and PAD ARE RELATED TO DM   Hypertension Patient is currently on amlodipine and hydrochlorothiazide and losartan, and their blood pressure today is 177/61, he says it runs about there at home as well.. Patient denies any lightheadedness or dizziness. Patient denies headaches, blurred vision, chest pains, shortness of breath, or weakness. Denies any side effects from medication and is content with current medication.   Hyperlipidemia and atherosclerosis with claudication in legs Patient is coming in for recheck of his hyperlipidemia. The patient is currently taking Pletal and pravastatin. They deny any issues with myalgias or history of liver damage from it. They deny any focal numbness or weakness or chest pain.   Depression recheck Patient is currently on Zoloft for depression.  He feels like things are going very well with the Zoloft and denies any depression.  Relevant past medical, surgical, family and social history reviewed and updated as indicated. Interim medical history since our last visit reviewed. Allergies and medications reviewed and updated.  Review of Systems  Constitutional: Negative for chills and fever.  Respiratory: Negative  for shortness of breath and wheezing.   Cardiovascular: Negative for chest pain and leg swelling.  Musculoskeletal: Negative for back pain and gait problem.  Skin: Negative for rash.  Neurological: Negative for dizziness, weakness and light-headedness.  All other systems reviewed and are negative.   Per HPI unless specifically indicated above   Allergies as of 10/26/2019   No Known Allergies     Medication List       Accurate as of Oct 26, 2019  9:06 AM. If you have any questions, ask your nurse or doctor.        STOP taking these medications   Ozempic (0.25 or 0.5 MG/DOSE) 2 MG/1.5ML Sopn Generic drug: Semaglutide(0.25 or 0.5MG/DOS) Stopped by: Fransisca Kaufmann Kadeidra Coryell, MD     TAKE these medications   amLODipine 10 MG tablet Commonly known as: NORVASC TAKE ONE (1) TABLET EACH DAY   aspirin EC 81 MG tablet Take 81 mg by mouth daily.   cilostazol 100 MG tablet Commonly known as: PLETAL TAKE 1 TABLET TWICE A DAY BEFORE A MEAL   hydrochlorothiazide 25 MG tablet Commonly known as: HYDRODIURIL Take 1 tablet (25 mg total) by mouth daily.   Insulin Pen Needle 31G X 8 MM Misc Commonly known as: B-D ULTRAFINE III SHORT PEN 1 each by Does not apply route as directed.   Lantus SoloStar 100 UNIT/ML Solostar Pen Generic drug: insulin glargine INJECT 60 UNITS SQ AT BEDTIME   losartan 100 MG tablet Commonly known as: COZAAR Take 1 tablet (100 mg total) by mouth daily.  metFORMIN 1000 MG tablet Commonly known as: GLUCOPHAGE Take 1 tablet (1,000 mg total) by mouth 2 (two) times daily with a meal.   OneTouch Verio test strip Generic drug: glucose blood   Potassium Chloride ER 20 MEQ Tbcr Take 20 mEq by mouth daily.   pravastatin 80 MG tablet Commonly known as: PRAVACHOL Take 80 mg by mouth daily.   sertraline 100 MG tablet Commonly known as: ZOLOFT Take 1 tablet (100 mg total) by mouth daily.        Objective:   BP (!) 177/61   Pulse 71   Temp 97.6 F (36.4  C) (Temporal)   Ht 5' 9" (1.753 m)   Wt 182 lb 4 oz (82.7 kg)   BMI 26.91 kg/m   Wt Readings from Last 3 Encounters:  10/26/19 182 lb 4 oz (82.7 kg)  08/09/19 184 lb 8.4 oz (83.7 kg)  07/27/19 184 lb 9.6 oz (83.7 kg)    Physical Exam Vitals and nursing note reviewed.  Constitutional:      General: He is not in acute distress.    Appearance: He is well-developed. He is not diaphoretic.  Eyes:     General: No scleral icterus.    Conjunctiva/sclera: Conjunctivae normal.  Neck:     Thyroid: No thyromegaly.  Cardiovascular:     Rate and Rhythm: Normal rate and regular rhythm.     Heart sounds: Normal heart sounds. No murmur.  Pulmonary:     Effort: Pulmonary effort is normal. No respiratory distress.     Breath sounds: Normal breath sounds. No wheezing.  Musculoskeletal:        General: Normal range of motion.     Cervical back: Neck supple.  Lymphadenopathy:     Cervical: No cervical adenopathy.  Skin:    General: Skin is warm and dry.     Findings: No rash.  Neurological:     Mental Status: He is alert and oriented to person, place, and time.     Coordination: Coordination normal.  Psychiatric:        Behavior: Behavior normal.       Assessment & Plan:   Problem List Items Addressed This Visit      Cardiovascular and Mediastinum   Essential hypertension, benign   Relevant Orders   Lipid panel   Atherosclerosis of native arteries of extremity with intermittent claudication (HCC)   Relevant Orders   Lipid panel     Endocrine   Controlled type 2 diabetes mellitus (HCC) - Primary   Relevant Medications   empagliflozin (JARDIANCE) 10 MG TABS tablet   Other Relevant Orders   Microalbumin / creatinine urine ratio   Bayer DCA Hb A1c Waived   CMP14+EGFR     Other   Mixed hyperlipidemia   Relevant Orders   Lipid panel   Recurrent major depressive disorder, in full remission (HCC)   Relevant Orders   Lipid panel      A1c 8.1, slightly improved, will refer  to clinical pharmacist.  Gave sample for Jardiance 10 mg for 1 month.  Also sent prescription for prescription assistance, printed out.  We will see if Jardiance helps both with blood pressure and diabetes. Follow up plan: Return in about 3 months (around 01/26/2020), or if symptoms worsen or fail to improve, for Diabetes recheck, also set up an appointment for Julie Pruitt, pharmacist.  Counseling provided for all of the vaccine components Orders Placed This Encounter  Procedures  . Microalbumin / creatinine urine ratio  .   Bayer DCA Hb A1c Waived     , MD [facility] 10/26/2019, 9:06 AM     

## 2019-10-27 LAB — CMP14+EGFR
ALT: 17 IU/L (ref 0–44)
AST: 15 IU/L (ref 0–40)
Albumin/Globulin Ratio: 2.3 — ABNORMAL HIGH (ref 1.2–2.2)
Albumin: 4.9 g/dL — ABNORMAL HIGH (ref 3.8–4.8)
Alkaline Phosphatase: 95 IU/L (ref 48–121)
BUN/Creatinine Ratio: 17 (ref 10–24)
BUN: 17 mg/dL (ref 8–27)
Bilirubin Total: 0.3 mg/dL (ref 0.0–1.2)
CO2: 26 mmol/L (ref 20–29)
Calcium: 9.8 mg/dL (ref 8.6–10.2)
Chloride: 99 mmol/L (ref 96–106)
Creatinine, Ser: 1.01 mg/dL (ref 0.76–1.27)
GFR calc Af Amer: 89 mL/min/{1.73_m2} (ref >59)
GFR calc non Af Amer: 77 mL/min/{1.73_m2} (ref >59)
Globulin, Total: 2.1 g/dL (ref 1.5–4.5)
Glucose: 119 mg/dL — ABNORMAL HIGH (ref 65–99)
Potassium: 3.5 mmol/L (ref 3.5–5.2)
Sodium: 142 mmol/L (ref 134–144)
Total Protein: 7 g/dL (ref 6.0–8.5)

## 2019-10-27 LAB — LIPID PANEL
Chol/HDL Ratio: 3.2 ratio (ref 0.0–5.0)
Cholesterol, Total: 143 mg/dL (ref 100–199)
HDL: 45 mg/dL (ref >39)
LDL Chol Calc (NIH): 75 mg/dL (ref 0–99)
Triglycerides: 132 mg/dL (ref 0–149)
VLDL Cholesterol Cal: 23 mg/dL (ref 5–40)

## 2019-10-27 LAB — MICROALBUMIN / CREATININE URINE RATIO
Creatinine, Urine: 53.3 mg/dL
Microalb/Creat Ratio: 445 mg/g creat — ABNORMAL HIGH (ref 0–29)
Microalbumin, Urine: 237.3 ug/mL

## 2019-11-05 DIAGNOSIS — J440 Chronic obstructive pulmonary disease with acute lower respiratory infection: Secondary | ICD-10-CM | POA: Diagnosis not present

## 2019-11-05 DIAGNOSIS — E876 Hypokalemia: Secondary | ICD-10-CM | POA: Diagnosis not present

## 2019-11-05 DIAGNOSIS — I10 Essential (primary) hypertension: Secondary | ICD-10-CM | POA: Diagnosis not present

## 2019-11-05 DIAGNOSIS — R0902 Hypoxemia: Secondary | ICD-10-CM | POA: Diagnosis not present

## 2019-11-05 DIAGNOSIS — Z7982 Long term (current) use of aspirin: Secondary | ICD-10-CM | POA: Diagnosis not present

## 2019-11-05 DIAGNOSIS — J441 Chronic obstructive pulmonary disease with (acute) exacerbation: Secondary | ICD-10-CM | POA: Diagnosis not present

## 2019-11-05 DIAGNOSIS — Z20822 Contact with and (suspected) exposure to covid-19: Secondary | ICD-10-CM | POA: Diagnosis not present

## 2019-11-05 DIAGNOSIS — R0600 Dyspnea, unspecified: Secondary | ICD-10-CM | POA: Diagnosis not present

## 2019-11-05 DIAGNOSIS — R0602 Shortness of breath: Secondary | ICD-10-CM | POA: Diagnosis not present

## 2019-11-05 DIAGNOSIS — R0689 Other abnormalities of breathing: Secondary | ICD-10-CM | POA: Diagnosis not present

## 2019-11-05 DIAGNOSIS — J449 Chronic obstructive pulmonary disease, unspecified: Secondary | ICD-10-CM | POA: Diagnosis not present

## 2019-11-05 DIAGNOSIS — J9811 Atelectasis: Secondary | ICD-10-CM | POA: Diagnosis not present

## 2019-11-05 DIAGNOSIS — J9601 Acute respiratory failure with hypoxia: Secondary | ICD-10-CM | POA: Diagnosis not present

## 2019-11-05 DIAGNOSIS — E119 Type 2 diabetes mellitus without complications: Secondary | ICD-10-CM | POA: Diagnosis not present

## 2019-11-05 DIAGNOSIS — Z794 Long term (current) use of insulin: Secondary | ICD-10-CM | POA: Diagnosis not present

## 2019-11-05 DIAGNOSIS — E785 Hyperlipidemia, unspecified: Secondary | ICD-10-CM | POA: Diagnosis not present

## 2019-11-05 DIAGNOSIS — Z720 Tobacco use: Secondary | ICD-10-CM | POA: Diagnosis not present

## 2019-11-05 DIAGNOSIS — J189 Pneumonia, unspecified organism: Secondary | ICD-10-CM | POA: Diagnosis not present

## 2019-11-09 ENCOUNTER — Ambulatory Visit: Payer: Medicare Other | Admitting: Pharmacist

## 2019-11-11 ENCOUNTER — Other Ambulatory Visit: Payer: Self-pay | Admitting: Family Medicine

## 2019-11-11 DIAGNOSIS — I10 Essential (primary) hypertension: Secondary | ICD-10-CM

## 2019-11-12 DIAGNOSIS — J440 Chronic obstructive pulmonary disease with acute lower respiratory infection: Secondary | ICD-10-CM | POA: Diagnosis not present

## 2019-11-12 DIAGNOSIS — Z7982 Long term (current) use of aspirin: Secondary | ICD-10-CM | POA: Diagnosis not present

## 2019-11-12 DIAGNOSIS — F17210 Nicotine dependence, cigarettes, uncomplicated: Secondary | ICD-10-CM | POA: Diagnosis not present

## 2019-11-12 DIAGNOSIS — J441 Chronic obstructive pulmonary disease with (acute) exacerbation: Secondary | ICD-10-CM | POA: Diagnosis not present

## 2019-11-12 DIAGNOSIS — Z7952 Long term (current) use of systemic steroids: Secondary | ICD-10-CM | POA: Diagnosis not present

## 2019-11-12 DIAGNOSIS — E119 Type 2 diabetes mellitus without complications: Secondary | ICD-10-CM | POA: Diagnosis not present

## 2019-11-12 DIAGNOSIS — E785 Hyperlipidemia, unspecified: Secondary | ICD-10-CM | POA: Diagnosis not present

## 2019-11-12 DIAGNOSIS — J9601 Acute respiratory failure with hypoxia: Secondary | ICD-10-CM | POA: Diagnosis not present

## 2019-11-12 DIAGNOSIS — Z794 Long term (current) use of insulin: Secondary | ICD-10-CM | POA: Diagnosis not present

## 2019-11-12 DIAGNOSIS — Z9181 History of falling: Secondary | ICD-10-CM | POA: Diagnosis not present

## 2019-11-12 DIAGNOSIS — I10 Essential (primary) hypertension: Secondary | ICD-10-CM | POA: Diagnosis not present

## 2019-11-14 DIAGNOSIS — E785 Hyperlipidemia, unspecified: Secondary | ICD-10-CM | POA: Diagnosis not present

## 2019-11-14 DIAGNOSIS — I10 Essential (primary) hypertension: Secondary | ICD-10-CM | POA: Diagnosis not present

## 2019-11-14 DIAGNOSIS — Z7952 Long term (current) use of systemic steroids: Secondary | ICD-10-CM | POA: Diagnosis not present

## 2019-11-14 DIAGNOSIS — J441 Chronic obstructive pulmonary disease with (acute) exacerbation: Secondary | ICD-10-CM | POA: Diagnosis not present

## 2019-11-14 DIAGNOSIS — Z794 Long term (current) use of insulin: Secondary | ICD-10-CM | POA: Diagnosis not present

## 2019-11-14 DIAGNOSIS — Z7982 Long term (current) use of aspirin: Secondary | ICD-10-CM | POA: Diagnosis not present

## 2019-11-14 DIAGNOSIS — J9601 Acute respiratory failure with hypoxia: Secondary | ICD-10-CM | POA: Diagnosis not present

## 2019-11-14 DIAGNOSIS — J440 Chronic obstructive pulmonary disease with acute lower respiratory infection: Secondary | ICD-10-CM | POA: Diagnosis not present

## 2019-11-14 DIAGNOSIS — F17210 Nicotine dependence, cigarettes, uncomplicated: Secondary | ICD-10-CM | POA: Diagnosis not present

## 2019-11-14 DIAGNOSIS — E119 Type 2 diabetes mellitus without complications: Secondary | ICD-10-CM | POA: Diagnosis not present

## 2019-11-14 DIAGNOSIS — Z9181 History of falling: Secondary | ICD-10-CM | POA: Diagnosis not present

## 2019-11-15 ENCOUNTER — Other Ambulatory Visit: Payer: Self-pay

## 2019-11-15 ENCOUNTER — Other Ambulatory Visit: Payer: Medicare Other

## 2019-11-15 DIAGNOSIS — N289 Disorder of kidney and ureter, unspecified: Secondary | ICD-10-CM | POA: Diagnosis not present

## 2019-11-16 ENCOUNTER — Ambulatory Visit (INDEPENDENT_AMBULATORY_CARE_PROVIDER_SITE_OTHER): Payer: Medicare Other | Admitting: Family Medicine

## 2019-11-16 ENCOUNTER — Encounter: Payer: Self-pay | Admitting: Family Medicine

## 2019-11-16 VITALS — BP 124/61 | HR 107 | Temp 97.4°F | Ht 69.0 in | Wt 177.5 lb

## 2019-11-16 DIAGNOSIS — J189 Pneumonia, unspecified organism: Secondary | ICD-10-CM | POA: Diagnosis not present

## 2019-11-16 DIAGNOSIS — N179 Acute kidney failure, unspecified: Secondary | ICD-10-CM | POA: Diagnosis not present

## 2019-11-16 LAB — CMP14+EGFR
ALT: 14 IU/L (ref 0–44)
AST: 14 IU/L (ref 0–40)
Albumin/Globulin Ratio: 2.1 (ref 1.2–2.2)
Albumin: 4.5 g/dL (ref 3.8–4.8)
Alkaline Phosphatase: 95 IU/L (ref 48–121)
BUN/Creatinine Ratio: 31 — ABNORMAL HIGH (ref 10–24)
BUN: 36 mg/dL — ABNORMAL HIGH (ref 8–27)
Bilirubin Total: 0.3 mg/dL (ref 0.0–1.2)
CO2: 24 mmol/L (ref 20–29)
Calcium: 9.9 mg/dL (ref 8.6–10.2)
Chloride: 96 mmol/L (ref 96–106)
Creatinine, Ser: 1.15 mg/dL (ref 0.76–1.27)
GFR calc Af Amer: 76 mL/min/{1.73_m2} (ref >59)
GFR calc non Af Amer: 65 mL/min/{1.73_m2} (ref >59)
Globulin, Total: 2.1 g/dL (ref 1.5–4.5)
Glucose: 133 mg/dL — ABNORMAL HIGH (ref 65–99)
Potassium: 3.9 mmol/L (ref 3.5–5.2)
Sodium: 139 mmol/L (ref 134–144)
Total Protein: 6.6 g/dL (ref 6.0–8.5)

## 2019-11-16 NOTE — Progress Notes (Signed)
BP 124/61    Pulse (!) 107    Temp (!) 97.4 F (36.3 C) (Temporal)    Ht '5\' 9"'  (1.753 m)    Wt 177 lb 8 oz (80.5 kg)    SpO2 94%    BMI 26.21 kg/m    Subjective:   Patient ID: Randy [name], male    DOB: 07-11-1950, 69 y.o.   MRN: [id number]  HPI: Randy [name] is a 69 y.o. male presenting on 11/16/2019 for Hospitalization Follow-up   HPI  Patient was in [facility] on last Saturday 5/29. D/c d on 6/1. Patient was in the hospital for pneumonia. He says since leaving the hospital he has been doing well. He still little fatigued at times but other than that he has no complaints. Denies any cough or wheezing or shortness of breath. He says his blood sugars went up to the three hundreds with the prednisone he was given but now have come back down to normal levels. Patient did have repeat kidney function just a yesterday ago and his renal function was back to normal.  Relevant past medical, surgical, family and social history reviewed and updated as indicated. Interim medical history since our last visit reviewed. Allergies and medications reviewed and updated.  Review of Systems  Constitutional: Positive for fatigue. Negative for chills and fever.  Eyes: Negative for visual disturbance.  Respiratory: Negative for cough, chest tightness, shortness of breath and wheezing.   Cardiovascular: Negative for chest pain, palpitations and leg swelling.  Musculoskeletal: Negative for back pain and gait problem.  Skin: Negative for rash.  Neurological: Negative for dizziness and light-headedness.  All other systems reviewed and are negative.   Per HPI unless specifically indicated above   Allergies as of 11/16/2019   No Known Allergies     Medication List       Accurate as of November 16, 2019  4:14 PM. If you have any questions, ask your nurse or doctor.        amLODipine 10 MG tablet Commonly known as: NORVASC TAKE ONE (1) TABLET EACH DAY   aspirin EC 81 MG tablet Take 81 mg by mouth  daily.   cilostazol 100 MG tablet Commonly known as: PLETAL TAKE 1 TABLET TWICE A DAY BEFORE A MEAL   hydrochlorothiazide 25 MG tablet Commonly known as: HYDRODIURIL Take 1 tablet (25 mg total) by mouth daily.   Insulin Pen Needle 31G X 8 MM Misc Commonly known as: B-D ULTRAFINE III SHORT PEN 1 each by Does not apply route as directed.   Jardiance 10 MG Tabs tablet Generic drug: empagliflozin Take 10 mg by mouth daily before breakfast.   Lantus SoloStar 100 UNIT/ML Solostar Pen Generic drug: insulin glargine INJECT 60 UNITS SQ AT BEDTIME   losartan 100 MG tablet Commonly known as: COZAAR Take 1 tablet (100 mg total) by mouth daily.   metFORMIN 1000 MG tablet Commonly known as: GLUCOPHAGE Take 1 tablet (1,000 mg total) by mouth 2 (two) times daily with a meal.   OneTouch Verio test strip Generic drug: glucose blood   Potassium Chloride ER 20 MEQ Tbcr Take 20 mEq by mouth daily.   pravastatin 80 MG tablet Commonly known as: PRAVACHOL Take 80 mg by mouth daily.   sertraline 100 MG tablet Commonly known as: ZOLOFT Take 1 tablet (100 mg total) by mouth daily.        Objective:   BP 124/61    Pulse (!) 107    Temp (!)  97.4 F (36.3 C) (Temporal)    Ht '5\' 9"'  (1.753 m)    Wt 177 lb 8 oz (80.5 kg)    SpO2 94%    BMI 26.21 kg/m   Wt Readings from Last 3 Encounters:  11/16/19 177 lb 8 oz (80.5 kg)  10/26/19 182 lb 4 oz (82.7 kg)  08/09/19 184 lb 8.4 oz (83.7 kg)    Physical Exam Vitals and nursing note reviewed.  Constitutional:      General: He is not in acute distress.    Appearance: He is well-developed. He is not diaphoretic.  Eyes:     General: No scleral icterus.    Conjunctiva/sclera: Conjunctivae normal.  Neck:     Thyroid: No thyromegaly.  Cardiovascular:     Rate and Rhythm: Normal rate and regular rhythm.     Heart sounds: Normal heart sounds. No murmur.  Pulmonary:     Effort: Pulmonary effort is normal. No respiratory distress.     Breath  sounds: Normal breath sounds. No wheezing.  Musculoskeletal:        General: Normal range of motion.     Cervical back: Neck supple.  Lymphadenopathy:     Cervical: No cervical adenopathy.  Skin:    General: Skin is warm and dry.     Findings: No rash.  Neurological:     Mental Status: He is alert and oriented to person, place, and time.     Coordination: Coordination normal.  Psychiatric:        Behavior: Behavior normal.     Results for orders placed or performed in visit on 11/15/19  CMP14+EGFR  Result Value Ref Range   Glucose 133 (H) 65 - 99 mg/dL   BUN 36 (H) 8 - 27 mg/dL   Creatinine, Ser 1.15 0.76 - 1.27 mg/dL   GFR calc non Af Amer 65 >59 mL/min/1.73   GFR calc Af Amer 76 >59 mL/min/1.73   BUN/Creatinine Ratio 31 (H) 10 - 24   Sodium 139 134 - 144 mmol/L   Potassium 3.9 3.5 - 5.2 mmol/L   Chloride 96 96 - 106 mmol/L   CO2 24 20 - 29 mmol/L   Calcium 9.9 8.6 - 10.2 mg/dL   Total Protein 6.6 6.0 - 8.5 g/dL   Albumin 4.5 3.8 - 4.8 g/dL   Globulin, Total 2.1 1.5 - 4.5 g/dL   Albumin/Globulin Ratio 2.1 1.2 - 2.2   Bilirubin Total 0.3 0.0 - 1.2 mg/dL   Alkaline Phosphatase 95 48 - 121 IU/L   AST 14 0 - 40 IU/L   ALT 14 0 - 44 IU/L    Assessment & Plan:   Problem List Items Addressed This Visit    None    Visit Diagnoses    Community acquired pneumonia, unspecified laterality    -  Primary   AKI (acute kidney injury) ([place])          Patient's renal function returned back to normal and he is doing fine with the breathing, still little fatigued but that is starting to improve. His blood sugars are coming back down now that he is off prednisone, follow-up with routine visit in 2 months. Follow up plan: Return in about 2 months (around 01/16/2020), or if symptoms worsen or fail to improve, for diabetes.  Counseling provided for all of the vaccine components No orders of the defined types were placed in this encounter.   Caryl Pina, MD [facility] 11/16/2019, 4:14 PM

## 2019-11-17 DIAGNOSIS — E119 Type 2 diabetes mellitus without complications: Secondary | ICD-10-CM | POA: Diagnosis not present

## 2019-11-17 DIAGNOSIS — J440 Chronic obstructive pulmonary disease with acute lower respiratory infection: Secondary | ICD-10-CM | POA: Diagnosis not present

## 2019-11-17 DIAGNOSIS — Z7952 Long term (current) use of systemic steroids: Secondary | ICD-10-CM | POA: Diagnosis not present

## 2019-11-17 DIAGNOSIS — J9601 Acute respiratory failure with hypoxia: Secondary | ICD-10-CM | POA: Diagnosis not present

## 2019-11-17 DIAGNOSIS — Z794 Long term (current) use of insulin: Secondary | ICD-10-CM | POA: Diagnosis not present

## 2019-11-17 DIAGNOSIS — Z7982 Long term (current) use of aspirin: Secondary | ICD-10-CM | POA: Diagnosis not present

## 2019-11-17 DIAGNOSIS — J441 Chronic obstructive pulmonary disease with (acute) exacerbation: Secondary | ICD-10-CM | POA: Diagnosis not present

## 2019-11-17 DIAGNOSIS — I10 Essential (primary) hypertension: Secondary | ICD-10-CM | POA: Diagnosis not present

## 2019-11-17 DIAGNOSIS — F17210 Nicotine dependence, cigarettes, uncomplicated: Secondary | ICD-10-CM | POA: Diagnosis not present

## 2019-11-17 DIAGNOSIS — E785 Hyperlipidemia, unspecified: Secondary | ICD-10-CM | POA: Diagnosis not present

## 2019-11-17 DIAGNOSIS — Z9181 History of falling: Secondary | ICD-10-CM | POA: Diagnosis not present

## 2019-11-21 DIAGNOSIS — Z7982 Long term (current) use of aspirin: Secondary | ICD-10-CM | POA: Diagnosis not present

## 2019-11-21 DIAGNOSIS — F17210 Nicotine dependence, cigarettes, uncomplicated: Secondary | ICD-10-CM | POA: Diagnosis not present

## 2019-11-21 DIAGNOSIS — E785 Hyperlipidemia, unspecified: Secondary | ICD-10-CM | POA: Diagnosis not present

## 2019-11-21 DIAGNOSIS — J441 Chronic obstructive pulmonary disease with (acute) exacerbation: Secondary | ICD-10-CM | POA: Diagnosis not present

## 2019-11-21 DIAGNOSIS — J440 Chronic obstructive pulmonary disease with acute lower respiratory infection: Secondary | ICD-10-CM | POA: Diagnosis not present

## 2019-11-21 DIAGNOSIS — J9601 Acute respiratory failure with hypoxia: Secondary | ICD-10-CM | POA: Diagnosis not present

## 2019-11-21 DIAGNOSIS — Z7952 Long term (current) use of systemic steroids: Secondary | ICD-10-CM | POA: Diagnosis not present

## 2019-11-21 DIAGNOSIS — Z9181 History of falling: Secondary | ICD-10-CM | POA: Diagnosis not present

## 2019-11-21 DIAGNOSIS — I10 Essential (primary) hypertension: Secondary | ICD-10-CM | POA: Diagnosis not present

## 2019-11-21 DIAGNOSIS — Z794 Long term (current) use of insulin: Secondary | ICD-10-CM | POA: Diagnosis not present

## 2019-11-21 DIAGNOSIS — E119 Type 2 diabetes mellitus without complications: Secondary | ICD-10-CM | POA: Diagnosis not present

## 2019-11-23 ENCOUNTER — Telehealth: Payer: Self-pay | Admitting: Family Medicine

## 2019-11-23 ENCOUNTER — Other Ambulatory Visit: Payer: Self-pay | Admitting: *Deleted

## 2019-11-23 MED ORDER — EMPAGLIFLOZIN 10 MG PO TABS: 10.0000 mg | ORAL_TABLET | Freq: Every day | ORAL | 5 refills | Status: DC

## 2019-11-23 NOTE — Telephone Encounter (Signed)
Patient aware, script is ready. 

## 2019-11-23 NOTE — Telephone Encounter (Signed)
°  Prescription Request  11/23/2019  What is the name of the medication or equipment? empagliflozin (JARDIANCE) 10 MG TABS tablet  Have you contacted your pharmacy to request a refill? (if applicable) yes but not sent over class says [place] would you like this sent to? [facility]   Patient notified that their request is being sent to the clinical staff for review and that they should receive a response within 2 business days.

## 2019-12-14 ENCOUNTER — Other Ambulatory Visit: Payer: Self-pay | Admitting: "Endocrinology

## 2019-12-14 ENCOUNTER — Other Ambulatory Visit: Payer: Self-pay | Admitting: Family Medicine

## 2019-12-14 DIAGNOSIS — I10 Essential (primary) hypertension: Secondary | ICD-10-CM

## 2019-12-21 ENCOUNTER — Ambulatory Visit (INDEPENDENT_AMBULATORY_CARE_PROVIDER_SITE_OTHER): Payer: Medicare Other

## 2019-12-21 ENCOUNTER — Other Ambulatory Visit: Payer: Self-pay

## 2019-12-21 DIAGNOSIS — Z7952 Long term (current) use of systemic steroids: Secondary | ICD-10-CM

## 2019-12-21 DIAGNOSIS — J9601 Acute respiratory failure with hypoxia: Secondary | ICD-10-CM | POA: Diagnosis not present

## 2019-12-21 DIAGNOSIS — J441 Chronic obstructive pulmonary disease with (acute) exacerbation: Secondary | ICD-10-CM | POA: Diagnosis not present

## 2019-12-21 DIAGNOSIS — Z9181 History of falling: Secondary | ICD-10-CM

## 2019-12-21 DIAGNOSIS — F17210 Nicotine dependence, cigarettes, uncomplicated: Secondary | ICD-10-CM

## 2019-12-21 DIAGNOSIS — E785 Hyperlipidemia, unspecified: Secondary | ICD-10-CM

## 2019-12-21 DIAGNOSIS — Z7982 Long term (current) use of aspirin: Secondary | ICD-10-CM

## 2019-12-21 DIAGNOSIS — I10 Essential (primary) hypertension: Secondary | ICD-10-CM

## 2019-12-21 DIAGNOSIS — J189 Pneumonia, unspecified organism: Secondary | ICD-10-CM

## 2019-12-21 DIAGNOSIS — J440 Chronic obstructive pulmonary disease with acute lower respiratory infection: Secondary | ICD-10-CM

## 2019-12-21 DIAGNOSIS — E119 Type 2 diabetes mellitus without complications: Secondary | ICD-10-CM

## 2019-12-21 DIAGNOSIS — Z794 Long term (current) use of insulin: Secondary | ICD-10-CM

## 2020-01-09 ENCOUNTER — Other Ambulatory Visit: Payer: Self-pay | Admitting: Family Medicine

## 2020-01-09 ENCOUNTER — Other Ambulatory Visit: Payer: Self-pay | Admitting: "Endocrinology

## 2020-01-09 ENCOUNTER — Telehealth: Payer: Self-pay | Admitting: Family Medicine

## 2020-01-09 DIAGNOSIS — I10 Essential (primary) hypertension: Secondary | ICD-10-CM

## 2020-01-09 MED ORDER — LANTUS SOLOSTAR 100 UNIT/ML ~~LOC~~ SOPN: PEN_INJECTOR | SUBCUTANEOUS | 0 refills | Status: DC

## 2020-01-09 NOTE — Telephone Encounter (Signed)
Pt has apt 8/25 w [facility] but he will be out of LANTUS SOLOSTAR 100 UNIT/ML Solostar Pen before then. Pt needs refill sent [facility]. Please call back

## 2020-01-09 NOTE — Telephone Encounter (Signed)
Refill sent patient aware  

## 2020-01-12 DIAGNOSIS — B351 Tinea unguium: Secondary | ICD-10-CM | POA: Diagnosis not present

## 2020-01-12 DIAGNOSIS — E1151 Type 2 diabetes mellitus with diabetic peripheral angiopathy without gangrene: Secondary | ICD-10-CM | POA: Diagnosis not present

## 2020-01-12 DIAGNOSIS — M79676 Pain in unspecified toe(s): Secondary | ICD-10-CM | POA: Diagnosis not present

## 2020-01-12 DIAGNOSIS — L84 Corns and callosities: Secondary | ICD-10-CM | POA: Diagnosis not present

## 2020-01-17 DIAGNOSIS — T63441A Toxic effect of venom of bees, accidental (unintentional), initial encounter: Secondary | ICD-10-CM | POA: Diagnosis not present

## 2020-01-17 DIAGNOSIS — L509 Urticaria, unspecified: Secondary | ICD-10-CM | POA: Diagnosis not present

## 2020-01-26 ENCOUNTER — Ambulatory Visit: Payer: Medicare Other | Admitting: Pharmacist

## 2020-02-01 ENCOUNTER — Other Ambulatory Visit: Payer: Self-pay

## 2020-02-01 ENCOUNTER — Ambulatory Visit (INDEPENDENT_AMBULATORY_CARE_PROVIDER_SITE_OTHER): Payer: Medicare Other | Admitting: Family Medicine

## 2020-02-01 ENCOUNTER — Encounter: Payer: Self-pay | Admitting: Family Medicine

## 2020-02-01 VITALS — BP 137/74 | HR 87 | Temp 98.3°F | Ht 69.0 in | Wt 182.0 lb

## 2020-02-01 DIAGNOSIS — E1169 Type 2 diabetes mellitus with other specified complication: Secondary | ICD-10-CM | POA: Diagnosis not present

## 2020-02-01 DIAGNOSIS — E782 Mixed hyperlipidemia: Secondary | ICD-10-CM | POA: Diagnosis not present

## 2020-02-01 DIAGNOSIS — Z794 Long term (current) use of insulin: Secondary | ICD-10-CM

## 2020-02-01 DIAGNOSIS — I10 Essential (primary) hypertension: Secondary | ICD-10-CM | POA: Diagnosis not present

## 2020-02-01 LAB — BAYER DCA HB A1C WAIVED: HB A1C (BAYER DCA - WAIVED): 9.7 % — ABNORMAL HIGH (ref <7.0)

## 2020-02-01 MED ORDER — CILOSTAZOL 100 MG PO TABS: 100.0000 mg | ORAL_TABLET | Freq: Two times a day (BID) | ORAL | 3 refills | Status: DC

## 2020-02-01 MED ORDER — METFORMIN HCL ER 500 MG PO TB24: 1000.0000 mg | ORAL_TABLET | Freq: Two times a day (BID) | ORAL | 3 refills | Status: DC

## 2020-02-01 MED ORDER — LANTUS SOLOSTAR 100 UNIT/ML ~~LOC~~ SOPN: 60.0000 [IU] | PEN_INJECTOR | Freq: Every day | SUBCUTANEOUS | 5 refills | Status: DC

## 2020-02-01 MED ORDER — EMPAGLIFLOZIN 10 MG PO TABS: 10.0000 mg | ORAL_TABLET | Freq: Every day | ORAL | 5 refills | Status: DC

## 2020-02-01 MED ORDER — LOSARTAN POTASSIUM 100 MG PO TABS: 100.0000 mg | ORAL_TABLET | Freq: Every day | ORAL | 3 refills | Status: DC

## 2020-02-01 MED ORDER — HYDROCHLOROTHIAZIDE 25 MG PO TABS: 25.0000 mg | ORAL_TABLET | Freq: Every day | ORAL | 3 refills | Status: DC

## 2020-02-01 NOTE — Progress Notes (Signed)
BP 137/74   Pulse 87   Temp 98.3 F (36.8 C)   Ht 5\' 9"  (1.753 m)   Wt 182 lb (82.6 kg)   BMI 26.88 kg/m    Subjective:   Patient ID: Randy [name], male    DOB: 29-Jul-1950, 69 y.o.   MRN: [id number]  HPI: Randy [name] is a 69 y.o. male presenting on 02/01/2020 for Medical Management of Chronic Issues, Diabetes, and Hypertension   HPI Type 2 diabetes mellitus Patient comes in today for recheck of his diabetes. Patient has been currently taking lantus, stopped metformin for diarrhea and Jardiance. Patient is currently on an ACE inhibitor/ARB. Patient has not seen an ophthalmologist this year. Patient denies any issues with their feet. The symptom started onset as an adult hypertension hyperlipidemia ARE RELATED TO DM   Hypertension Patient is currently on amlodipine and losartan and hctz, and their blood pressure today is 137/74. Patient denies any lightheadedness or dizziness. Patient denies headaches, blurred vision, chest pains, shortness of breath, or weakness. Denies any side effects from medication and is content with current medication.   Hyperlipidemia Patient is coming in for recheck of his hyperlipidemia. The patient is currently taking pravastatin. They deny any issues with myalgias or history of liver damage from it. They deny any focal numbness or weakness or chest pain.   Depression anxiety recheck Patient is coming in for depression and anxiety recheck today, patient is currently taking Zoloft and says it is working well denies any major issues denies any suicidal ideations or thoughts of hurting self. Depression screen [facility] 2/9 02/01/2020 11/16/2019 10/26/2019 08/09/2019 07/27/2019  Decreased Interest 0 0 0 0 0  Down, Depressed, Hopeless 0 0 0 0 0  PHQ - 2 Score 0 0 0 0 0  Altered sleeping - 0 - - -  Tired, decreased energy - 1 - - -  Change in appetite - 0 - - -  Feeling bad or failure about yourself  - 0 - - -  Trouble concentrating - 0 - - -  Moving slowly or  fidgety/restless - 0 - - -  Suicidal thoughts - 0 - - -  PHQ-9 Score - 1 - - -  Difficult doing work/chores - Not difficult at all - - -     Relevant past medical, surgical, family and social history reviewed and updated as indicated. Interim medical history since our last visit reviewed. Allergies and medications reviewed and updated.  Review of Systems  Constitutional: Negative for chills and fever.  Eyes: Negative for visual disturbance.  Respiratory: Negative for shortness of breath and wheezing.   Cardiovascular: Negative for chest pain and leg swelling.  Musculoskeletal: Negative for back pain and gait problem.  Skin: Negative for rash.  Neurological: Negative for dizziness, weakness and light-headedness.  All other systems reviewed and are negative.   Per HPI unless specifically indicated above   Allergies as of 02/01/2020   No Known Allergies     Medication List       Accurate as of February 01, 2020 11:19 AM. If you have any questions, ask your nurse or doctor.        STOP taking these medications   metFORMIN 1000 MG tablet Commonly known as: GLUCOPHAGE Replaced by: metFORMIN 500 MG 24 hr tablet Stopped by: Fransisca Kaufmann Karl Erway, MD     TAKE these medications   amLODipine 10 MG tablet Commonly known as: NORVASC TAKE ONE (1) TABLET EACH DAY   aspirin EC  81 MG tablet Take 81 mg by mouth daily.   cilostazol 100 MG tablet Commonly known as: PLETAL Take 1 tablet (100 mg total) by mouth 2 (two) times daily. What changed: See the new instructions. Changed by: Fransisca Kaufmann Adrien Dietzman, MD   empagliflozin 10 MG Tabs tablet Commonly known as: Jardiance Take 1 tablet (10 mg total) by mouth daily before breakfast.   hydrochlorothiazide 25 MG tablet Commonly known as: HYDRODIURIL Take 1 tablet (25 mg total) by mouth daily.   Insulin Pen Needle 31G X 8 MM Misc Commonly known as: B-D ULTRAFINE III SHORT PEN 1 each by Does not apply route as directed.   Lantus SoloStar  100 UNIT/ML Solostar Pen Generic drug: insulin glargine Inject 60 Units into the skin at bedtime. What changed:   how much to take  how to take this  when to take this  additional instructions Changed by: Fransisca Kaufmann Yadhira Mckneely, MD   losartan 100 MG tablet Commonly known as: COZAAR Take 1 tablet (100 mg total) by mouth daily.   metFORMIN 500 MG 24 hr tablet Commonly known as: Glucophage XR Take 2 tablets (1,000 mg total) by mouth in the morning and at bedtime. Replaces: metFORMIN 1000 MG tablet Started by: Fransisca Kaufmann Depaul Arizpe, MD   OneTouch Verio test strip Generic drug: glucose blood   Potassium Chloride ER 20 MEQ Tbcr Take 20 mEq by mouth daily.   pravastatin 80 MG tablet Commonly known as: PRAVACHOL Take 80 mg by mouth daily.   sertraline 100 MG tablet Commonly known as: ZOLOFT Take 1 tablet (100 mg total) by mouth daily.        Objective:   BP 137/74   Pulse 87   Temp 98.3 F (36.8 C)   Ht 5\' 9"  (1.753 m)   Wt 182 lb (82.6 kg)   BMI 26.88 kg/m   Wt Readings from Last 3 Encounters:  02/01/20 182 lb (82.6 kg)  11/16/19 177 lb 8 oz (80.5 kg)  10/26/19 182 lb 4 oz (82.7 kg)    Physical Exam Vitals and nursing note reviewed.  Constitutional:      General: He is not in acute distress.    Appearance: He is well-developed. He is not diaphoretic.  Eyes:     General: No scleral icterus.    Conjunctiva/sclera: Conjunctivae normal.  Neck:     Thyroid: No thyromegaly.  Cardiovascular:     Rate and Rhythm: Normal rate and regular rhythm.     Heart sounds: Normal heart sounds. No murmur heard.   Pulmonary:     Effort: Pulmonary effort is normal. No respiratory distress.     Breath sounds: Normal breath sounds. No wheezing.  Musculoskeletal:        General: Normal range of motion.     Cervical back: Neck supple.  Lymphadenopathy:     Cervical: No cervical adenopathy.  Skin:    General: Skin is warm and dry.     Findings: No rash.  Neurological:      Mental Status: He is alert and oriented to person, place, and time.     Coordination: Coordination normal.  Psychiatric:        Behavior: Behavior normal.       Assessment & Plan:   Problem List Items Addressed This Visit      Cardiovascular and Mediastinum   Essential hypertension, benign   Relevant Medications   hydrochlorothiazide (HYDRODIURIL) 25 MG tablet   losartan (COZAAR) 100 MG tablet     Endocrine  Controlled type 2 diabetes mellitus (HCC) - Primary   Relevant Medications   insulin glargine (LANTUS SOLOSTAR) 100 UNIT/ML Solostar Pen   empagliflozin (JARDIANCE) 10 MG TABS tablet   losartan (COZAAR) 100 MG tablet   metFORMIN (GLUCOPHAGE XR) 500 MG 24 hr tablet   Other Relevant Orders   Bayer DCA Hb A1c Waived     Other   Mixed hyperlipidemia   Relevant Medications   hydrochlorothiazide (HYDRODIURIL) 25 MG tablet   losartan (COZAAR) 100 MG tablet      Will set patient up with Lottie Dawson for prescription assistance, switched his Metformin over to extended release and he will try that instead to see if it helps with his diarrhea.  No change in blood pressure medication, seems to be doing well. Follow up plan: Return in about 3 months (around 05/03/2020), or if symptoms worsen or fail to improve, for Diabetes hypertension.  Counseling provided for all of the vaccine components Orders Placed This Encounter  Procedures  . Bayer [facility] Hb A1c [place], MD [facility] 02/01/2020, 11:19 AM

## 2020-02-02 ENCOUNTER — Ambulatory Visit (INDEPENDENT_AMBULATORY_CARE_PROVIDER_SITE_OTHER): Payer: Medicare Other | Admitting: Pharmacist

## 2020-02-02 DIAGNOSIS — E119 Type 2 diabetes mellitus without complications: Secondary | ICD-10-CM | POA: Diagnosis not present

## 2020-02-02 MED ORDER — DAPAGLIFLOZIN PROPANEDIOL 10 MG PO TABS: 10.0000 mg | ORAL_TABLET | Freq: Every day | ORAL | 0 refills | Status: DC

## 2020-02-02 NOTE — Progress Notes (Signed)
° ° °  02/02/2020 Name: Randy [name] MRN: [id number] DOB: 06/06/1951   S:  19 yoM presents for diabetes evaluation, education, and management Patient was referred and last seen by Primary Care Provider on 02/01/20.  Insurance coverage/medication affordability: [employer]HC medicare  Patient denies adherence with medications.  Current diabetes medications include: lantus, metformin XR/jardiance  Current hypertension medications include: hctz, losartan, amlodipine Goal 130/80  Current hyperlipidemia medications include: pravastatin   Patient denies hypoglycemic events.  Recommend patient:  Avoid sugary drinks and desserts  Incorporate balanced protein, non starchy veggies, 1 serving of carbohydrate with each meal  Increase water intake  Increase physical activity as able  Patient-reported exercise habits: n/a  O:  Lab Results  Component Value Date   HGBA1C 9.7 (H) 02/01/2020    There were no vitals filed for this visit.     Lipid Panel     Component Value Date/Time   CHOL 143 10/26/2019 0830   TRIG 132 10/26/2019 0830   HDL 45 10/26/2019 0830   CHOLHDL 3.2 10/26/2019 0830   LDLCALC 75 10/26/2019 0830     Home fasting blood sugars: n/a  2 hour post-meal/random blood sugars: n/a.   A/P:  Diabetes T2DM currently uncontrolled. Patient is adherent with medication. Control is suboptimal due to cost.  RESTART SGLT2-->Switch from jardiance to farxiga due to patient assistance programs   Will start farxiga 10mg  daily  30 day voucher given for patient to pick up at local drug store  [place] for patient assistance Wilder Glade)  Patient has not been taking SGLT2 due to cost  Continue lantus for now  Will transition to basaglar based on patient assistance program  Continue metformin XR  -Extensively discussed pathophysiology of diabetes, recommended lifestyle interventions, dietary effects on blood sugar control  -Counseled on s/sx of and management  of hypoglycemia  -Next A1C anticipated 3 months.   Written patient instructions provided.  Total time in face to face counseling 20 minutes.   Follow up PCP Clinic Visit in 3 months.    Regina Eck, PharmD, BCPS Clinical Pharmacist, [place]  II Phone [phone]

## 2020-02-06 ENCOUNTER — Telehealth: Payer: Self-pay | Admitting: Pharmacist

## 2020-02-06 NOTE — Telephone Encounter (Signed)
approved for lilly cares (basaglar) until 06/08/2020 Patient was given phone number to set up shipment Patient verbalizes understanding

## 2020-02-14 ENCOUNTER — Telehealth: Payer: Self-pay | Admitting: Family Medicine

## 2020-02-14 NOTE — Telephone Encounter (Signed)
Pt states that with taking the dapagliflozin propanediol (FARXIGA) 10 MG TABS tablet He has to go to the restroom every 10 mins. Would like to discuss with the nurse.

## 2020-02-14 NOTE — Telephone Encounter (Signed)
VMB not set up

## 2020-02-21 ENCOUNTER — Encounter: Payer: Self-pay | Admitting: Pharmacist

## 2020-02-23 NOTE — Telephone Encounter (Signed)
Multiple attempts made to contact patient this encounter will now be closed.

## 2020-02-29 ENCOUNTER — Ambulatory Visit (INDEPENDENT_AMBULATORY_CARE_PROVIDER_SITE_OTHER): Payer: Medicare Other | Admitting: Pharmacist

## 2020-02-29 ENCOUNTER — Other Ambulatory Visit: Payer: Self-pay

## 2020-02-29 DIAGNOSIS — E119 Type 2 diabetes mellitus without complications: Secondary | ICD-10-CM | POA: Diagnosis not present

## 2020-02-29 NOTE — Progress Notes (Signed)
    02/29/2020 Name: Randy [name] MRN: [id number] DOB: 10-Oct-1950   S:  45 yoM presents for diabetes evaluation, education, and management Patient was referred and last seen by Primary Care Provider on 02/01/20.  Insurance coverage/medication affordability: UHC medicare  Patient denies adherence with medications.  Current diabetes medications include: lantus, metformin XR/jardiance  Current hypertension medications include: hctz, losartan, amlodipine Goal 130/80  Current hyperlipidemia medications include: pravastatin   Patient denies hypoglycemic events.  Recommend patient:  Avoid sugary drinks and desserts  Incorporate balanced protein, non starchy veggies, 1 serving of carbohydrate with each meal  Increase water intake  Increase physical activity as able  Patient-reported exercise habits: n/a O:  Lab Results  Component Value Date   HGBA1C 9.7 (H) 02/01/2020    There were no vitals filed for this visit.     Lipid Panel     Component Value Date/Time   CHOL 143 10/26/2019 0830   TRIG 132 10/26/2019 0830   HDL 45 10/26/2019 0830   CHOLHDL 3.2 10/26/2019 0830   LDLCALC 75 10/26/2019 0830     Home fasting blood sugars: 245 today, patient not checking  2 hour post-meal/random blood sugars: not checking     A/P:  Diabetes T2DM currently uncontrolled. Patient is adherent with medication. Control is suboptimal due to cost.  -Patient complaining of very frequent urination with Wilder Glade RESTART SGLT2-->Try lower dose [place] 5mg  daily.             Application [place] for [place] for patient assistance Wilder Glade)              -If Wilder Glade does not work, will transition patient to tradjenta (prefers another pill).  Could explore GLP1 in the future as well  -INCREASE basaglar to 65 units daily--patient received free shipment in the mail  -Continue metformin XR  -Extensively discussed pathophysiology of diabetes, recommended lifestyle interventions,  dietary effects on blood sugar control  -Counseled on s/sx of and management of hypoglycemia  -Next A1C anticipated 3 months.   Written patient instructions provided.  Total time in face to face counseling 15 minutes.   Follow up PCP Clinic Visit in 3 months.    Regina Eck, PharmD, BCPS Clinical Pharmacist, [place]  II Phone [phone]

## 2020-03-14 ENCOUNTER — Telehealth: Payer: Self-pay

## 2020-03-14 NOTE — Telephone Encounter (Signed)
Pt called to let Almyra Free know that the [place] is not working for him.

## 2020-03-14 NOTE — Telephone Encounter (Signed)
Patient to switch to tradjenta (samples given at last visit) He cannot tolerate farxiga 5mg   He reports going to urinate >20 times per day Continue Lantus Continue reduced metformin 500mg  BID (due to GI discomfort)  Will apply for patient assistance if this medication is tolerated

## 2020-03-22 DIAGNOSIS — E1142 Type 2 diabetes mellitus with diabetic polyneuropathy: Secondary | ICD-10-CM | POA: Diagnosis not present

## 2020-03-22 DIAGNOSIS — L84 Corns and callosities: Secondary | ICD-10-CM | POA: Diagnosis not present

## 2020-03-22 DIAGNOSIS — M79672 Pain in left foot: Secondary | ICD-10-CM | POA: Diagnosis not present

## 2020-03-22 DIAGNOSIS — B351 Tinea unguium: Secondary | ICD-10-CM | POA: Diagnosis not present

## 2020-03-26 ENCOUNTER — Telehealth: Payer: Self-pay

## 2020-03-27 NOTE — Telephone Encounter (Signed)
Call placed to patient to f/u on status of tradjenta Will have to call company Patient aware I will return his call in 24 hrs

## 2020-03-28 ENCOUNTER — Other Ambulatory Visit: Payer: Self-pay | Admitting: Family Medicine

## 2020-03-28 DIAGNOSIS — I10 Essential (primary) hypertension: Secondary | ICD-10-CM

## 2020-03-28 NOTE — Telephone Encounter (Signed)
Please let patient know: Tradjenta samples are up front for him to pick up (#21 tabs, medication for diabetes)  I am still waiting for approval from medication assistance program.  I will let him know when approved.    Thank you very much!

## 2020-03-28 NOTE — Telephone Encounter (Signed)
Patient aware.

## 2020-03-28 NOTE — Telephone Encounter (Signed)
lmtcb

## 2020-04-09 NOTE — Telephone Encounter (Signed)
Cancelled farxiga due to side effects Patient approved for Onglyza, patient should receive shipment 04/12/20 Medication list updated

## 2020-04-09 NOTE — Addendum Note (Signed)
Addended by: Lottie Dawson D on: 04/09/2020 03:56 PM   Modules accepted: Orders

## 2020-04-09 NOTE — Telephone Encounter (Signed)
Patient aware he was approved for Farxiga 10mg  daily via [place] patient assistance program

## 2020-04-25 ENCOUNTER — Other Ambulatory Visit: Payer: Self-pay

## 2020-04-25 ENCOUNTER — Other Ambulatory Visit: Payer: Medicare Other

## 2020-04-25 DIAGNOSIS — E782 Mixed hyperlipidemia: Secondary | ICD-10-CM | POA: Diagnosis not present

## 2020-04-25 DIAGNOSIS — I10 Essential (primary) hypertension: Secondary | ICD-10-CM | POA: Diagnosis not present

## 2020-04-25 DIAGNOSIS — E119 Type 2 diabetes mellitus without complications: Secondary | ICD-10-CM | POA: Diagnosis not present

## 2020-04-25 LAB — CBC WITH DIFFERENTIAL/PLATELET
Basophils Absolute: 0.1 10*3/uL (ref 0.0–0.2)
Basos: 1 %
EOS (ABSOLUTE): 0.3 10*3/uL (ref 0.0–0.4)
Eos: 3 %
Hematocrit: 42.8 % (ref 37.5–51.0)
Hemoglobin: 15.3 g/dL (ref 13.0–17.7)
Immature Grans (Abs): 0.1 10*3/uL (ref 0.0–0.1)
Immature Granulocytes: 1 %
Lymphocytes Absolute: 1.5 10*3/uL (ref 0.7–3.1)
Lymphs: 18 %
MCH: 33.7 pg — ABNORMAL HIGH (ref 26.6–33.0)
MCHC: 35.7 g/dL (ref 31.5–35.7)
MCV: 94 fL (ref 79–97)
Monocytes Absolute: 0.7 10*3/uL (ref 0.1–0.9)
Monocytes: 8 %
Neutrophils Absolute: 5.7 10*3/uL (ref 1.4–7.0)
Neutrophils: 69 %
Platelets: 202 10*3/uL (ref 150–450)
RBC: 4.54 x10E6/uL (ref 4.14–5.80)
RDW: 12.4 % (ref 11.6–15.4)
WBC: 8.2 10*3/uL (ref 3.4–10.8)

## 2020-04-25 LAB — CMP14+EGFR
ALT: 19 IU/L (ref 0–44)
AST: 19 IU/L (ref 0–40)
Albumin/Globulin Ratio: 2.4 — ABNORMAL HIGH (ref 1.2–2.2)
Albumin: 4.8 g/dL (ref 3.8–4.8)
Alkaline Phosphatase: 119 IU/L (ref 44–121)
BUN/Creatinine Ratio: 16 (ref 10–24)
BUN: 18 mg/dL (ref 8–27)
Bilirubin Total: 0.5 mg/dL (ref 0.0–1.2)
CO2: 27 mmol/L (ref 20–29)
Calcium: 9.8 mg/dL (ref 8.6–10.2)
Chloride: 97 mmol/L (ref 96–106)
Creatinine, Ser: 1.16 mg/dL (ref 0.76–1.27)
GFR calc Af Amer: 74 mL/min/{1.73_m2} (ref >59)
GFR calc non Af Amer: 64 mL/min/{1.73_m2} (ref >59)
Globulin, Total: 2 g/dL (ref 1.5–4.5)
Glucose: 216 mg/dL — ABNORMAL HIGH (ref 65–99)
Potassium: 4.1 mmol/L (ref 3.5–5.2)
Sodium: 142 mmol/L (ref 134–144)
Total Protein: 6.8 g/dL (ref 6.0–8.5)

## 2020-04-25 LAB — BAYER DCA HB A1C WAIVED: HB A1C (BAYER DCA - WAIVED): 10 % — ABNORMAL HIGH (ref <7.0)

## 2020-04-25 LAB — LIPID PANEL
Chol/HDL Ratio: 3.4 ratio (ref 0.0–5.0)
Cholesterol, Total: 148 mg/dL (ref 100–199)
HDL: 44 mg/dL (ref >39)
LDL Chol Calc (NIH): 82 mg/dL (ref 0–99)
Triglycerides: 124 mg/dL (ref 0–149)
VLDL Cholesterol Cal: 22 mg/dL (ref 5–40)

## 2020-05-02 ENCOUNTER — Telehealth: Payer: Self-pay | Admitting: Pharmacist

## 2020-05-02 ENCOUNTER — Other Ambulatory Visit: Payer: Self-pay

## 2020-05-02 ENCOUNTER — Ambulatory Visit (INDEPENDENT_AMBULATORY_CARE_PROVIDER_SITE_OTHER): Payer: Medicare Other | Admitting: Family Medicine

## 2020-05-02 ENCOUNTER — Encounter: Payer: Self-pay | Admitting: Family Medicine

## 2020-05-02 VITALS — BP 143/73 | HR 92 | Temp 92.0°F | Ht 69.0 in | Wt 189.0 lb

## 2020-05-02 DIAGNOSIS — Z23 Encounter for immunization: Secondary | ICD-10-CM | POA: Diagnosis not present

## 2020-05-02 DIAGNOSIS — E782 Mixed hyperlipidemia: Secondary | ICD-10-CM | POA: Diagnosis not present

## 2020-05-02 DIAGNOSIS — Z794 Long term (current) use of insulin: Secondary | ICD-10-CM

## 2020-05-02 DIAGNOSIS — Z1211 Encounter for screening for malignant neoplasm of colon: Secondary | ICD-10-CM

## 2020-05-02 DIAGNOSIS — E1169 Type 2 diabetes mellitus with other specified complication: Secondary | ICD-10-CM

## 2020-05-02 DIAGNOSIS — I10 Essential (primary) hypertension: Secondary | ICD-10-CM

## 2020-05-02 DIAGNOSIS — F3342 Major depressive disorder, recurrent, in full remission: Secondary | ICD-10-CM

## 2020-05-02 MED ORDER — TRULICITY 1.5 MG/0.5ML ~~LOC~~ SOAJ: 1.5000 mg | SUBCUTANEOUS | 3 refills | Status: DC

## 2020-05-02 MED ORDER — CILOSTAZOL 100 MG PO TABS: 100.0000 mg | ORAL_TABLET | Freq: Two times a day (BID) | ORAL | 3 refills | Status: DC

## 2020-05-02 MED ORDER — LOSARTAN POTASSIUM 100 MG PO TABS
100.0000 mg | ORAL_TABLET | Freq: Every day | ORAL | 3 refills | Status: DC
Start: 2020-05-02 — End: 2021-03-13

## 2020-05-02 MED ORDER — SERTRALINE HCL 100 MG PO TABS
ORAL_TABLET | ORAL | 3 refills | Status: DC
Start: 2020-05-02 — End: 2020-08-16

## 2020-05-02 MED ORDER — FREESTYLE LIBRE 2 READER DEVI: 0 refills | Status: DC

## 2020-05-02 MED ORDER — AMLODIPINE BESYLATE 10 MG PO TABS: ORAL_TABLET | ORAL | 3 refills | Status: DC

## 2020-05-02 MED ORDER — ONETOUCH VERIO W/DEVICE KIT: 1.0000 | PACK | Freq: Four times a day (QID) | 1 refills | Status: DC | PRN

## 2020-05-02 MED ORDER — HYDROCHLOROTHIAZIDE 25 MG PO TABS
25.0000 mg | ORAL_TABLET | Freq: Every day | ORAL | 3 refills | Status: DC
Start: 2020-05-02 — End: 2021-04-17

## 2020-05-02 MED ORDER — LANTUS SOLOSTAR 100 UNIT/ML ~~LOC~~ SOPN: 60.0000 [IU] | PEN_INJECTOR | Freq: Every day | SUBCUTANEOUS | 5 refills | Status: DC

## 2020-05-02 MED ORDER — FREESTYLE LIBRE 2 SENSOR MISC: 11 refills | Status: DC

## 2020-05-02 MED ORDER — ONETOUCH VERIO VI STRP: 1.0000 | ORAL_STRIP | Freq: Four times a day (QID) | 1 refills | Status: DC | PRN

## 2020-05-02 MED ORDER — METFORMIN HCL ER 500 MG PO TB24
1000.0000 mg | ORAL_TABLET | Freq: Two times a day (BID) | ORAL | 3 refills | Status: DC
Start: 2020-05-02 — End: 2021-05-15

## 2020-05-02 NOTE — Telephone Encounter (Signed)
New start Randy [name] Will send paperwork to [facility] based on insurance coverage Fax [phone] Phone [phone]

## 2020-05-02 NOTE — Progress Notes (Addendum)
BP (!) 143/73   Pulse 92   Temp (!) 92 F (33.3 C)   Ht '5\' 9"'  (1.753 m)   Wt 189 lb (85.7 kg)   SpO2 98%   BMI 27.91 kg/m    Subjective:   Patient ID: Randy [name], male    DOB: 07/07/1950, 69 y.o.   MRN: [id number]  HPI: Randy [name] is a 69 y.o. male presenting on 05/02/2020 for Medical Management of Chronic Issues, Diabetes, and Hypertension   HPI Type 2 diabetes mellitus Patient comes in today for recheck of his diabetes. Patient has been currently taking Lantus and Metformin. Patient is currently on an ACE inhibitor/ARB. Patient has seen an ophthalmologist this year. Patient denies any issues with their feet. The symptom started onset as an adult hypertension hyperlipidemia ARE RELATED TO DM.  Patient will be injecting diabetes medication to 3 times daily and checking blood sugars 4-6 times daily  Hypertension Patient is currently on amlodipine and hydrochlorothiazide and losartan, and their blood pressure today is 143/73. Patient denies any lightheadedness or dizziness. Patient denies headaches, blurred vision, chest pains, shortness of breath, or weakness. Denies any side effects from medication and is content with current medication.   Hyperlipidemia Patient is coming in for recheck of his hyperlipidemia. The patient is currently taking Pletal and pravastatin. They deny any issues with myalgias or history of liver damage from it. They deny any focal numbness or weakness or chest pain.   Patient is coming in for recheck of depression, currently taking Zoloft and doing okay with it.  Denies any major depression or suicidal ideations.  He feels like the medicine is working for him.  Relevant past medical, surgical, family and social history reviewed and updated as indicated. Interim medical history since our last visit reviewed. Allergies and medications reviewed and updated.  Review of Systems  Constitutional: Negative for chills and fever.  Respiratory: Negative  for shortness of breath and wheezing.   Cardiovascular: Negative for chest pain and leg swelling.  Musculoskeletal: Negative for back pain and gait problem.  Skin: Negative for rash.  Neurological: Negative for dizziness, weakness and light-headedness.  All other systems reviewed and are negative.   Per HPI unless specifically indicated above   Allergies as of 05/02/2020   No Known Allergies     Medication List       Accurate as of May 02, 2020  8:16 AM. If you have any questions, ask your nurse or doctor.        amLODipine 10 MG tablet Commonly known as: NORVASC TAKE ONE (1) TABLET EACH DAY   aspirin EC 81 MG tablet Take 81 mg by mouth daily.   cilostazol 100 MG tablet Commonly known as: PLETAL Take 1 tablet (100 mg total) by mouth 2 (two) times daily.   hydrochlorothiazide 25 MG tablet Commonly known as: HYDRODIURIL Take 1 tablet (25 mg total) by mouth daily.   Insulin Pen Needle 31G X 8 MM Misc Commonly known as: B-D ULTRAFINE III SHORT PEN 1 each by Does not apply route as directed.   Lantus SoloStar 100 UNIT/ML Solostar Pen Generic drug: insulin glargine Inject 60 Units into the skin at bedtime.   losartan 100 MG tablet Commonly known as: COZAAR Take 1 tablet (100 mg total) by mouth daily.   metFORMIN 500 MG 24 hr tablet Commonly known as: Glucophage XR Take 2 tablets (1,000 mg total) by mouth in the morning and at bedtime. What changed: how much to take  OneTouch Verio test strip Generic drug: glucose blood   Onglyza 5 MG Tabs tablet Generic drug: saxagliptin HCl Take 5 mg by mouth daily.   Potassium Chloride ER 20 MEQ Tbcr Take 20 mEq by mouth daily.   pravastatin 80 MG tablet Commonly known as: PRAVACHOL Take 80 mg by mouth daily.   sertraline 100 MG tablet Commonly known as: ZOLOFT TAKE ONE (1) TABLET EACH DAY        Objective:   BP (!) 143/73   Pulse 92   Temp (!) 92 F (33.3 C)   Ht '5\' 9"'  (1.753 m)   Wt 189 lb (85.7  kg)   SpO2 98%   BMI 27.91 kg/m   Wt Readings from Last 3 Encounters:  05/02/20 189 lb (85.7 kg)  02/01/20 182 lb (82.6 kg)  11/16/19 177 lb 8 oz (80.5 kg)    Physical Exam Vitals and nursing note reviewed.  Constitutional:      General: He is not in acute distress.    Appearance: He is well-developed. He is not diaphoretic.  Eyes:     General: No scleral icterus.    Conjunctiva/sclera: Conjunctivae normal.  Neck:     Thyroid: No thyromegaly.  Cardiovascular:     Rate and Rhythm: Normal rate and regular rhythm.     Heart sounds: Normal heart sounds. No murmur heard.   Pulmonary:     Effort: Pulmonary effort is normal. No respiratory distress.     Breath sounds: Normal breath sounds. No wheezing.  Musculoskeletal:        General: Normal range of motion.     Cervical back: Neck supple.  Lymphadenopathy:     Cervical: No cervical adenopathy.  Skin:    General: Skin is warm and dry.     Findings: No rash.  Neurological:     Mental Status: He is alert and oriented to person, place, and time.     Coordination: Coordination normal.  Psychiatric:        Behavior: Behavior normal.     Results for orders placed or performed in visit on 04/25/20  CBC with Differential/Platelet  Result Value Ref Range   WBC 8.2 3.4 - 10.8 x10E3/uL   RBC 4.54 4.14 - 5.80 x10E6/uL   Hemoglobin 15.3 13.0 - 17.7 g/dL   Hematocrit 42.8 37.5 - 51.0 %   MCV 94 79 - 97 fL   MCH 33.7 (H) 26.6 - 33.0 pg   MCHC 35.7 31 - 35 g/dL   RDW 12.4 11.6 - 15.4 %   Platelets 202 150 - 450 x10E3/uL   Neutrophils 69 Not Estab. %   Lymphs 18 Not Estab. %   Monocytes 8 Not Estab. %   Eos 3 Not Estab. %   Basos 1 Not Estab. %   Neutrophils Absolute 5.7 1.40 - 7.00 x10E3/uL   Lymphocytes Absolute 1.5 0 - 3 x10E3/uL   Monocytes Absolute 0.7 0 - 0 x10E3/uL   EOS (ABSOLUTE) 0.3 0.0 - 0.4 x10E3/uL   Basophils Absolute 0.1 0 - 0 x10E3/uL   Immature Granulocytes 1 Not Estab. %   Immature Grans (Abs) 0.1 0.0 - 0.1  x10E3/uL  CMP14+EGFR  Result Value Ref Range   Glucose 216 (H) 65 - 99 mg/dL   BUN 18 8 - 27 mg/dL   Creatinine, Ser 1.16 0.76 - 1.27 mg/dL   GFR calc non Af Amer 64 >59 mL/min/1.73   GFR calc Af Amer 74 >59 mL/min/1.73   BUN/Creatinine Ratio 16 10 -  24   Sodium 142 134 - 144 mmol/L   Potassium 4.1 3.5 - 5.2 mmol/L   Chloride 97 96 - 106 mmol/L   CO2 27 20 - 29 mmol/L   Calcium 9.8 8.6 - 10.2 mg/dL   Total Protein 6.8 6.0 - 8.5 g/dL   Albumin 4.8 3.8 - 4.8 g/dL   Globulin, Total 2.0 1.5 - 4.5 g/dL   Albumin/Globulin Ratio 2.4 (H) 1.2 - 2.2   Bilirubin Total 0.5 0.0 - 1.2 mg/dL   Alkaline Phosphatase 119 44 - 121 IU/L   AST 19 0 - 40 IU/L   ALT 19 0 - 44 IU/L  Bayer DCA Hb A1c Waived  Result Value Ref Range   HB A1C (BAYER DCA - WAIVED) 10.0 (H) <7.0 %  Lipid panel  Result Value Ref Range   Cholesterol, Total 148 100 - 199 mg/dL   Triglycerides 124 0 - 149 mg/dL   HDL 44 >39 mg/dL   VLDL Cholesterol Cal 22 5 - 40 mg/dL   LDL Chol Calc (NIH) 82 0 - 99 mg/dL   Chol/HDL Ratio 3.4 0.0 - 5.0 ratio    Assessment & Plan:   Problem List Items Addressed This Visit      Cardiovascular and Mediastinum   Essential hypertension, benign   Relevant Medications   amLODipine (NORVASC) 10 MG tablet   hydrochlorothiazide (HYDRODIURIL) 25 MG tablet   losartan (COZAAR) 100 MG tablet     Endocrine   Controlled type 2 diabetes mellitus (HCC)   Relevant Medications   insulin glargine (LANTUS SOLOSTAR) 100 UNIT/ML Solostar Pen   losartan (COZAAR) 100 MG tablet   metFORMIN (GLUCOPHAGE XR) 500 MG 24 hr tablet   Dulaglutide (TRULICITY) 1.5 SX/1.1BZ SOPN     Other   Mixed hyperlipidemia   Relevant Medications   amLODipine (NORVASC) 10 MG tablet   hydrochlorothiazide (HYDRODIURIL) 25 MG tablet   losartan (COZAAR) 100 MG tablet   Recurrent major depressive disorder, in full remission (HCC)   Relevant Medications   sertraline (ZOLOFT) 100 MG tablet    Other Visit Diagnoses    Colon  cancer screening    -  Primary   Relevant Orders   Cologuard      A1c 10.0, has increased from where it was.  Patient needs a 1 week appointment with Antoine Primas samples for Trulicity starting at 2.08 for the first 2 weeks and going up to 1.5 for 2 weeks, follow-up with Lottie Dawson because she will help with getting a Dexcom and ordering the Trulicity.  This office visit was a visit to discuss patient's diabetic management and because he is out of control and having to check her blood sugars 4 times daily I believe he would be a good candidate for a continuous subcutaneous continuous glucose monitor such as freestyle libre or Dexcom.  Follow up plan: Return in about 3 months (around 08/02/2020), or if symptoms worsen or fail to improve.  Counseling provided for all of the vaccine components No orders of the defined types were placed in this encounter.   Caryl Pina, MD [facility] 05/02/2020, 8:16 AM

## 2020-05-08 DIAGNOSIS — Z794 Long term (current) use of insulin: Secondary | ICD-10-CM | POA: Diagnosis not present

## 2020-05-08 DIAGNOSIS — E119 Type 2 diabetes mellitus without complications: Secondary | ICD-10-CM | POA: Diagnosis not present

## 2020-05-15 ENCOUNTER — Other Ambulatory Visit: Payer: Self-pay

## 2020-05-15 ENCOUNTER — Ambulatory Visit (INDEPENDENT_AMBULATORY_CARE_PROVIDER_SITE_OTHER): Payer: Medicare Other | Admitting: Pharmacist

## 2020-05-15 DIAGNOSIS — E119 Type 2 diabetes mellitus without complications: Secondary | ICD-10-CM

## 2020-05-15 NOTE — Progress Notes (Signed)
    05/15/2020 Name: Randy [name] MRN: [id number] DOB: 1951/02/24   S:  52 YOM Presents for diabetes evaluation, education, and management Patient was referred and last seen by Primary Care Provider on 04/25/20.  Insurance coverage/medication affordability: [employer] medicare  Patient reports adherence with medications. . Current diabetes medications include: new start trulicity, basaglar, metformin . Current hypertension medications include: losartan, hctz, amlodipine Goal 130/80 . Current hyperlipidemia medications include: pravastatin  Patient denies hypoglycemic events.  Patient reports nocturia (nighttime urination).  Patient denies neuropathy (nerve pain).  Patient denies visual changes.  O:  Lab Results  Component Value Date   HGBA1C 10.0 (H) 04/25/2020   Lipid Panel     Component Value Date/Time   CHOL 148 04/25/2020 0918   TRIG 124 04/25/2020 0918   HDL 44 04/25/2020 0918   CHOLHDL 3.4 04/25/2020 0918   LDLCALC 82 04/25/2020 0918     Home fasting blood sugars: 170-180 (IMPROVING)  2 hour post-meal/random blood sugars: N/A.   A/P:  Diabetes T2DM currently UNCONTROLLED. Patient is adherent with medication. Control is suboptimal due to diet/lifestyle.  LIBRE 2 CGM APPLIED (PATIENT GETTING LIBRE VIA [facility] #[phone])  APPLICATION FOR RE-ENROLLMENT [employer] PATIENT [facility]IST[facility] PROGRAM SENT TO COMPANY FOR TRULICITY AND BASAGLAR  . COMPANY -[employer] [facility]IENT [facility] (TRULICITY & BASAGLAR) . YOU HAVE TO CALL--[phone], PRESS 1 AND STATE YOU ARE CALLING TO SET UP SHIPMENT o WAIT TIMES CAN BE UP TO 1 HOUR . ENROLLED IN AUTOMATIC REFILLS MEDICATION WILL SHIP EVERY 4 MONTHS TO YOUR HOUSE  -Continue basaglar, trulicity to 1.5mg  sq weekly, and metformin  -Extensively discussed pathophysiology of diabetes, recommended lifestyle interventions, dietary effects on blood sugar control  -Counseled on s/sx of and management of  hypoglycemia  -Next A1C anticipated 3 months.  Written patient instructions provided.  Total time in face to face counseling 30 minutes.   Follow up PCP Clinic Visit in 3 months.   Regina Eck, PharmD, BCPS Clinical Pharmacist, [place]  II Phone [phone]

## 2020-05-24 DIAGNOSIS — E1142 Type 2 diabetes mellitus with diabetic polyneuropathy: Secondary | ICD-10-CM | POA: Diagnosis not present

## 2020-05-24 DIAGNOSIS — M79676 Pain in unspecified toe(s): Secondary | ICD-10-CM | POA: Diagnosis not present

## 2020-05-24 DIAGNOSIS — B351 Tinea unguium: Secondary | ICD-10-CM | POA: Diagnosis not present

## 2020-05-24 DIAGNOSIS — L84 Corns and callosities: Secondary | ICD-10-CM | POA: Diagnosis not present

## 2020-06-06 MED ORDER — TRULICITY 1.5 MG/0.5ML ~~LOC~~ SOAJ: 1.5000 mg | SUBCUTANEOUS | 6 refills | Status: DC

## 2020-06-06 MED ORDER — BASAGLAR KWIKPEN 100 UNIT/ML ~~LOC~~ SOPN: 60.0000 [IU] | PEN_INJECTOR | Freq: Every day | SUBCUTANEOUS | 11 refills | Status: DC

## 2020-06-06 NOTE — Addendum Note (Signed)
Addended by: Vanice Sarah D on: 06/06/2020 04:21 PM   Modules accepted: Orders

## 2020-06-27 DIAGNOSIS — S82832A Other fracture of upper and lower end of left fibula, initial encounter for closed fracture: Secondary | ICD-10-CM | POA: Diagnosis not present

## 2020-06-27 DIAGNOSIS — M7989 Other specified soft tissue disorders: Secondary | ICD-10-CM | POA: Diagnosis not present

## 2020-06-27 DIAGNOSIS — M25572 Pain in left ankle and joints of left foot: Secondary | ICD-10-CM | POA: Diagnosis not present

## 2020-06-28 DIAGNOSIS — S82832A Other fracture of upper and lower end of left fibula, initial encounter for closed fracture: Secondary | ICD-10-CM | POA: Diagnosis not present

## 2020-06-28 DIAGNOSIS — M25572 Pain in left ankle and joints of left foot: Secondary | ICD-10-CM | POA: Diagnosis not present

## 2020-07-02 ENCOUNTER — Other Ambulatory Visit: Payer: Self-pay | Admitting: Family Medicine

## 2020-07-03 ENCOUNTER — Other Ambulatory Visit: Payer: Self-pay | Admitting: *Deleted

## 2020-07-03 MED ORDER — PRAVASTATIN SODIUM 80 MG PO TABS
80.0000 mg | ORAL_TABLET | Freq: Every day | ORAL | 0 refills | Status: DC
Start: 2020-07-03 — End: 2020-08-01

## 2020-07-05 DIAGNOSIS — M25572 Pain in left ankle and joints of left foot: Secondary | ICD-10-CM | POA: Diagnosis not present

## 2020-07-05 DIAGNOSIS — Z09 Encounter for follow-up examination after completed treatment for conditions other than malignant neoplasm: Secondary | ICD-10-CM | POA: Diagnosis not present

## 2020-08-01 ENCOUNTER — Encounter: Payer: Self-pay | Admitting: Family Medicine

## 2020-08-01 ENCOUNTER — Ambulatory Visit (INDEPENDENT_AMBULATORY_CARE_PROVIDER_SITE_OTHER): Payer: Medicare Other | Admitting: Family Medicine

## 2020-08-01 ENCOUNTER — Telehealth: Payer: Self-pay

## 2020-08-01 ENCOUNTER — Other Ambulatory Visit: Payer: Self-pay

## 2020-08-01 VITALS — BP 147/70 | HR 82 | Ht 69.0 in | Wt 183.0 lb

## 2020-08-01 DIAGNOSIS — E119 Type 2 diabetes mellitus without complications: Secondary | ICD-10-CM

## 2020-08-01 DIAGNOSIS — I10 Essential (primary) hypertension: Secondary | ICD-10-CM

## 2020-08-01 DIAGNOSIS — Z794 Long term (current) use of insulin: Secondary | ICD-10-CM

## 2020-08-01 DIAGNOSIS — E1169 Type 2 diabetes mellitus with other specified complication: Secondary | ICD-10-CM

## 2020-08-01 DIAGNOSIS — E782 Mixed hyperlipidemia: Secondary | ICD-10-CM | POA: Diagnosis not present

## 2020-08-01 LAB — BAYER DCA HB A1C WAIVED: HB A1C (BAYER DCA - WAIVED): 8.6 % — ABNORMAL HIGH (ref <7.0)

## 2020-08-01 MED ORDER — PRAVASTATIN SODIUM 80 MG PO TABS
80.0000 mg | ORAL_TABLET | Freq: Every day | ORAL | 3 refills | Status: DC
Start: 2020-08-01 — End: 2021-06-05

## 2020-08-01 NOTE — Telephone Encounter (Signed)
Placed CBC, Chem-12, lipid and A1c for him

## 2020-08-01 NOTE — Progress Notes (Signed)
BP (!) 147/70   Pulse 82   Ht $R'5\' 9"'cT$  (1.753 m)   Wt 183 lb (83 kg)   SpO2 97%   BMI 27.02 kg/m    Subjective:   Patient ID: Randy [name], male    DOB: [birth year], 70 y.o.   MRN: [id number]  HPI: Randy [name] is a 70 y.o. male presenting on 08/01/2020 for Medical Management of Chronic Issues and Diabetes   HPI Type 2 diabetes mellitus Patient comes in today for recheck of his diabetes. Patient has been currently taking Basaglar and Metformin and Trulicity. Patient is currently on an ACE inhibitor/ARB. Patient has not seen an ophthalmologist this year. Patient denies any issues with their feet. The symptom started onset as an adult hypertension hyperlipidemia and atherosclerosis ARE RELATED TO DM   Hypertension Patient is currently on amlodipine and hydrochlorothiazide and losartan, and their blood pressure today is 147/70. Patient denies any lightheadedness or dizziness. Patient denies headaches, blurred vision, chest pains, shortness of breath, or weakness. Denies any side effects from medication and is content with current medication.   Hyperlipidemia Patient is coming in for recheck of his hyperlipidemia. The patient is currently taking pravastatin. They deny any issues with myalgias or history of liver damage from it. They deny any focal numbness or weakness or chest pain.   Relevant past medical, surgical, family and social history reviewed and updated as indicated. Interim medical history since our last visit reviewed. Allergies and medications reviewed and updated.  Review of Systems  Constitutional: Negative for chills and fever.  Eyes: Negative for discharge.  Respiratory: Negative for shortness of breath and wheezing.   Cardiovascular: Negative for chest pain and leg swelling.  Musculoskeletal: Negative for back pain and gait problem.  Skin: Negative for rash.  Neurological: Negative for dizziness, weakness and light-headedness.  All other systems reviewed and  are negative.   Per HPI unless specifically indicated above   Allergies as of 08/01/2020   No Known Allergies     Medication List       Accurate as of August 01, 2020  8:23 AM. If you have any questions, ask your nurse or doctor.        amLODipine 10 MG tablet Commonly known as: NORVASC TAKE ONE (1) TABLET EACH DAY   aspirin EC 81 MG tablet Take 81 mg by mouth daily.   Basaglar KwikPen 100 UNIT/ML Inject 60 Units into the skin daily.   cilostazol 100 MG tablet Commonly known as: PLETAL Take 1 tablet (100 mg total) by mouth 2 (two) times daily.   FreeStyle Libre 2 Reader [place] Use to check blood sugar 6 times daily. DX: E11.9   FreeStyle Libre 2 Sensor Misc Use to check blood sugar 6 times daily. Change sensor every 14 days. DX: E11.9   hydrochlorothiazide 25 MG tablet Commonly known as: HYDRODIURIL Take 1 tablet (25 mg total) by mouth daily.   Insulin Pen Needle 31G X 8 MM Misc Commonly known as: B-D ULTRAFINE III SHORT PEN 1 each by Does not apply route as directed.   losartan 100 MG tablet Commonly known as: COZAAR Take 1 tablet (100 mg total) by mouth daily.   metFORMIN 500 MG 24 hr tablet Commonly known as: Glucophage XR Take 2 tablets (1,000 mg total) by mouth in the morning and at bedtime.   OneTouch Verio test strip Generic drug: glucose blood 1 each by Other route 4 (four) times daily as needed for other.   OneTouch Verio  w/Device Kit 1 each by Does not apply route 4 (four) times daily as needed.   Potassium Chloride ER 20 MEQ Tbcr Take 20 mEq by mouth daily.   pravastatin 80 MG tablet Commonly known as: PRAVACHOL Take 1 tablet (80 mg total) by mouth daily.   sertraline 100 MG tablet Commonly known as: ZOLOFT TAKE ONE (1) TABLET EACH DAY   Trulicity 1.5 YS/0.6TK Sopn Generic drug: Dulaglutide Inject 1.5 mg into the skin once a week.        Objective:   BP (!) 147/70   Pulse 82   Ht _0  (1.753 m)   Wt 183 lb (83 kg)   SpO2  97%   BMI 27.02 kg/m   Wt Readings from Last 3 Encounters:  08/01/20 183 lb (83 kg)  05/02/20 189 lb (85.7 kg)  02/01/20 182 lb (82.6 kg)    Physical Exam Vitals and nursing note reviewed.  Constitutional:      General: He is not in acute distress.    Appearance: He is well-developed and well-nourished. He is not diaphoretic.  Eyes:     General: No scleral icterus.    Extraocular Movements: EOM normal.     Conjunctiva/sclera: Conjunctivae normal.  Neck:     Thyroid: No thyromegaly.  Cardiovascular:     Rate and Rhythm: Normal rate and regular rhythm.     Pulses: Intact distal pulses.     Heart sounds: Normal heart sounds. No murmur heard.   Pulmonary:     Effort: Pulmonary effort is normal. No respiratory distress.     Breath sounds: Normal breath sounds. No wheezing.  Musculoskeletal:        General: No edema. Normal range of motion.     Cervical back: Neck supple.  Lymphadenopathy:     Cervical: No cervical adenopathy.  Skin:    General: Skin is warm and dry.     Findings: No rash.  Neurological:     Mental Status: He is alert and oriented to person, place, and time.     Coordination: Coordination normal.  Psychiatric:        Mood and Affect: Mood and affect normal.        Behavior: Behavior normal.      A1c is 8.6 which is improved Assessment & Plan:   Problem List Items Addressed This Visit      Cardiovascular and Mediastinum   Essential hypertension, benign   Relevant Medications   pravastatin (PRAVACHOL) 80 MG tablet     Endocrine   Type 2 diabetes mellitus with other specified complication (HCC)   Relevant Medications   pravastatin (PRAVACHOL) 80 MG tablet     Other   Mixed hyperlipidemia   Relevant Medications   pravastatin (PRAVACHOL) 80 MG tablet    Other Visit Diagnoses    Type 2 diabetes mellitus without complication, without long-term current use of insulin (HCC)    -  Primary   Relevant Medications   pravastatin (PRAVACHOL) 80 MG  tablet   Other Relevant Orders   Bayer DCA Hb A1c Waived      A1c 8.6, will hold on Trulicity for now and continue to diet and given 3 more months, if still elevated in 3 months then will increase the Trulicity then. Follow up plan: Return in about 3 months (around 10/29/2020), or if symptoms worsen or fail to improve, for Diabetes and hypertension cholesterol.  Counseling provided for all of the vaccine components Orders Placed This Encounter  Procedures  .  Bayer [facility] Hb A1c Horace, MD [facility] 08/01/2020, 8:23 AM

## 2020-08-15 ENCOUNTER — Ambulatory Visit: Payer: Medicare Other

## 2020-08-16 ENCOUNTER — Other Ambulatory Visit: Payer: Self-pay

## 2020-08-16 ENCOUNTER — Ambulatory Visit: Payer: Medicare PPO | Admitting: Family Medicine

## 2020-08-16 ENCOUNTER — Encounter: Payer: Self-pay | Admitting: Family Medicine

## 2020-08-16 VITALS — BP 120/67 | HR 106 | Ht 69.0 in | Wt 179.0 lb

## 2020-08-16 DIAGNOSIS — F339 Major depressive disorder, recurrent, unspecified: Secondary | ICD-10-CM | POA: Diagnosis not present

## 2020-08-16 MED ORDER — CITALOPRAM HYDROBROMIDE 20 MG PO TABS: 20.0000 mg | ORAL_TABLET | Freq: Every day | ORAL | 3 refills | Status: DC

## 2020-08-16 NOTE — Progress Notes (Signed)
BP 120/67   Pulse (!) 106   Ht 5' 9" (1.753 m)   Wt 179 lb (81.2 kg)   SpO2 96%   BMI 26.43 kg/m    Subjective:   Patient ID: Randy [name], male    DOB: 01-03-1951, 70 y.o.   MRN: [id number]  HPI: Randy [name] is a 70 y.o. male presenting on 08/16/2020 for Medical Management of Chronic Issues and Depression   HPI Depression recheck Patient had a fracture his ankle has been less mobile and has been a lot more depressed since then.  He said he started feeling a little more depressed even before his ankle fracture but the fracture is definitely not help to because of his immobility.  He has been taking sertraline for many years and just feels like is not working anymore and would like to try something different.  It did work for him for many years.  He denies any suicidal ideations but has been feeling a lot more down recently. Depression screen [facility] 2/9 08/16/2020 08/01/2020 05/02/2020 02/01/2020 11/16/2019  Decreased Interest 1 0 0 0 0  Down, Depressed, Hopeless 3 0 0 0 0  PHQ - 2 Score 4 0 0 0 0  Altered sleeping 1 - - - 0  Tired, decreased energy 1 - - - 1  Change in appetite 1 - - - 0  Feeling bad or failure about yourself  3 - - - 0  Trouble concentrating 1 - - - 0  Moving slowly or fidgety/restless 1 - - - 0  Suicidal thoughts 0 - - - 0  PHQ-9 Score - - - - 1  Difficult doing work/chores - - - - Not difficult at all     Relevant past medical, surgical, family and social history reviewed and updated as indicated. Interim medical history since our last visit reviewed. Allergies and medications reviewed and updated.  Review of Systems  Constitutional: Negative for chills and fever.  Eyes: Negative for visual disturbance.  Respiratory: Negative for shortness of breath and wheezing.   Cardiovascular: Negative for chest pain and leg swelling.  Musculoskeletal: Negative for back pain and gait problem.  Skin: Negative for rash.  Psychiatric/Behavioral: Positive for  dysphoric mood and sleep disturbance. Negative for self-injury and suicidal ideas. The patient is nervous/anxious.   All other systems reviewed and are negative.   Per HPI unless specifically indicated above   Allergies as of 08/16/2020   No Known Allergies     Medication List       Accurate as of August 16, 2020  9:51 AM. If you have any questions, ask your nurse or doctor.        STOP taking these medications   sertraline 100 MG tablet Commonly known as: ZOLOFT Stopped by: Fransisca Kaufmann Dettinger, MD     TAKE these medications   amLODipine 10 MG tablet Commonly known as: NORVASC TAKE ONE (1) TABLET EACH DAY   aspirin EC 81 MG tablet Take 81 mg by mouth daily.   Basaglar KwikPen 100 UNIT/ML Inject 60 Units into the skin daily.   cilostazol 100 MG tablet Commonly known as: PLETAL Take 1 tablet (100 mg total) by mouth 2 (two) times daily.   citalopram 20 MG tablet Commonly known as: CELEXA Take 1 tablet (20 mg total) by mouth daily. Started by: Worthy Rancher, MD   FreeStyle Libre 2 Reader [facility] Use to check blood sugar 6 times daily. DX: E11.9   FreeStyle Libre 2  Sensor Misc Use to check blood sugar 6 times daily. Change sensor every 14 days. DX: E11.9   hydrochlorothiazide 25 MG tablet Commonly known as: HYDRODIURIL Take 1 tablet (25 mg total) by mouth daily.   Insulin Pen Needle 31G X 8 MM Misc Commonly known as: B-D ULTRAFINE III SHORT PEN 1 each by Does not apply route as directed.   losartan 100 MG tablet Commonly known as: COZAAR Take 1 tablet (100 mg total) by mouth daily.   metFORMIN 500 MG 24 hr tablet Commonly known as: Glucophage XR Take 2 tablets (1,000 mg total) by mouth in the morning and at bedtime.   OneTouch Verio test strip Generic drug: glucose blood 1 each by Other route 4 (four) times daily as needed for other.   OneTouch Verio w/Device Kit 1 each by Does not apply route 4 (four) times daily as needed.   Potassium Chloride ER  20 MEQ Tbcr Take 20 mEq by mouth daily.   pravastatin 80 MG tablet Commonly known as: PRAVACHOL Take 1 tablet (80 mg total) by mouth daily.   Trulicity 1.5 NO/7.0JG Sopn Generic drug: Dulaglutide Inject 1.5 mg into the skin once a week.        Objective:   BP 120/67   Pulse (!) 106   Ht 5' 9" (1.753 m)   Wt 179 lb (81.2 kg)   SpO2 96%   BMI 26.43 kg/m   Wt Readings from Last 3 Encounters:  08/16/20 179 lb (81.2 kg)  08/01/20 183 lb (83 kg)  05/02/20 189 lb (85.7 kg)    Physical Exam Vitals and nursing note reviewed.  Constitutional:      General: He is not in acute distress.    Appearance: He is well-developed. He is not diaphoretic.  Eyes:     General: No scleral icterus.    Conjunctiva/sclera: Conjunctivae normal.  Neck:     Thyroid: No thyromegaly.  Cardiovascular:     Rate and Rhythm: Normal rate and regular rhythm.     Heart sounds: Normal heart sounds. No murmur heard.   Pulmonary:     Effort: Pulmonary effort is normal. No respiratory distress.     Breath sounds: Normal breath sounds. No wheezing.  Neurological:     Mental Status: He is alert and oriented to person, place, and time.     Coordination: Coordination normal.  Psychiatric:        Mood and Affect: Mood is anxious and depressed.        Behavior: Behavior normal.        Thought Content: Thought content does not include suicidal ideation. Thought content does not include suicidal plan.       Assessment & Plan:   Problem List Items Addressed This Visit      Other   Depression, recurrent ([name]) - Primary   Relevant Medications   citalopram (CELEXA) 20 MG tablet      Was switched from sertraline to citalopram Randy [name] does better, wanting to start at 20 mg.  And if it does not go well after the first 2 weeks then I instructed him to go ahead and double that up and start taking 40 mg and to call us so we can send a new prescription for 40 mg. Follow up plan: Return in about 2 months  (around 10/16/2020), or if symptoms worsen or fail to improve, for Diabetes and depression anxiety follow-up.  Counseling provided for all of the vaccine components No orders of the defined types  were placed in this encounter.   Caryl Pina, MD [facility] 08/16/2020, 9:51 AM

## 2020-09-05 ENCOUNTER — Ambulatory Visit: Payer: Medicare PPO | Admitting: Family Medicine

## 2020-09-05 ENCOUNTER — Other Ambulatory Visit: Payer: Self-pay

## 2020-09-05 ENCOUNTER — Encounter: Payer: Self-pay | Admitting: Family Medicine

## 2020-09-05 VITALS — BP 131/66 | HR 98 | Temp 97.8°F | Ht 69.0 in | Wt 174.4 lb

## 2020-09-05 DIAGNOSIS — F339 Major depressive disorder, recurrent, unspecified: Secondary | ICD-10-CM

## 2020-09-05 DIAGNOSIS — F411 Generalized anxiety disorder: Secondary | ICD-10-CM | POA: Diagnosis not present

## 2020-09-05 MED ORDER — CITALOPRAM HYDROBROMIDE 20 MG PO TABS: 60.0000 mg | ORAL_TABLET | Freq: Every day | ORAL | 1 refills | Status: DC

## 2020-09-05 MED ORDER — QUETIAPINE FUMARATE 25 MG PO TABS: 25.0000 mg | ORAL_TABLET | Freq: Every day | ORAL | 1 refills | Status: DC

## 2020-09-05 NOTE — Progress Notes (Signed)
Subjective:  Patient ID: Randy [name], male    DOB: 09/13/1950  Age: 70 y.o. MRN: [id number]  CC: Depression and Anxiety   HPI Randy [name] presents for decreased concentration. Nervous. No change since starting the celexa. Takin 2 daily. Sometimes taking 4 a day.  Verified that he just continued the sertraline.  He is taking the tablets in the morning.  PHQ and GAD-7 reviewed.  In particular the GAD shows that he is concerned something awful could happen.  He worries constantly.  He is not satisfied with his work.  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  He feels like people are constantly coming at him from every direction.  He has some sleep disruption as well.  See below.  Depression screen [facility] 2/9 08/16/2020 08/01/2020 05/02/2020  Decreased Interest 1 0 0  Down, Depressed, Hopeless 3 0 0  PHQ - 2 Score 4 0 0  Altered sleeping 1 - -  Tired, decreased energy 1 - -  Change in appetite 1 - -  Feeling bad or failure about yourself  3 - -  Trouble concentrating 1 - -  Moving slowly or fidgety/restless 1 - -  Suicidal thoughts 0 - -  PHQ-9 Score - - -  Difficult doing work/chores - - -    History Randy [name] has a past medical history of Diabetes mellitus without complication ([place]), Hyperlipidemia, and Hypertension.   He has a past surgical history that includes No past surgeries and Hemorrhoid surgery (N/A, 06/08/2013).   His family history is not on file.He reports that he quit smoking about 6 months ago. His smoking use included pipe. He has a 40.00 pack-year smoking history. He has never used smokeless tobacco. He reports that he does not drink alcohol and does not use drugs.    ROS Review of Systems  Constitutional: Positive for fatigue. Negative for fever.  Respiratory: Negative for shortness of breath.   Cardiovascular: Negative for chest pain.  Musculoskeletal: Negative for arthralgias.  Skin: Negative for rash.   Psychiatric/Behavioral: Positive for decreased concentration, dysphoric mood and sleep disturbance. Negative for self-injury and suicidal ideas. The patient is nervous/anxious.     Objective:  BP 131/66   Pulse 98   Temp 97.8 F (36.6 C)   Ht '5\' 9"'  (1.753 m)   Wt 174 lb 6.4 oz (79.1 kg)   SpO2 99%   BMI 25.75 kg/m   BP Readings from Last 3 Encounters:  09/05/20 131/66  08/16/20 120/67  08/01/20 (!) 147/70    Wt Readings from Last 3 Encounters:  09/05/20 174 lb 6.4 oz (79.1 kg)  08/16/20 179 lb (81.2 kg)  08/01/20 183 lb (83 kg)     Physical Exam Vitals reviewed.  Constitutional:      General: He is not in acute distress.    Appearance: He is well-developed. He is ill-appearing.  HENT:     Head: Normocephalic and atraumatic.     Right Ear: External ear normal.     Left Ear: External ear normal.     Mouth/Throat:     Pharynx: No oropharyngeal exudate or posterior oropharyngeal erythema.  Eyes:     Pupils: Pupils are equal, round, and reactive to light.  Cardiovascular:     Rate and Rhythm: Normal rate and regular rhythm.     Heart sounds: No murmur heard.   Pulmonary:     Effort: No respiratory distress.  Breath sounds: Normal breath sounds.  Musculoskeletal:     Cervical back: Normal range of motion and neck supple.  Neurological:     Mental Status: He is alert and oriented to person, place, and time.  Psychiatric:        Mood and Affect: Mood is depressed. Affect is blunt.        Speech: Speech normal.        Behavior: Behavior is slowed.        Cognition and Memory: Cognition normal.       Assessment & Plan:   Randy [name] was seen today for depression and anxiety.  Diagnoses and all orders for this visit:  Depression, recurrent ([place]) -     citalopram (CELEXA) 20 MG tablet; Take 3 tablets (60 mg total) by mouth daily. -     QUEtiapine (SEROQUEL) 25 MG tablet; Take 1 tablet (25 mg total) by mouth at bedtime.  GAD (generalized anxiety disorder) -      citalopram (CELEXA) 20 MG tablet; Take 3 tablets (60 mg total) by mouth daily. -     QUEtiapine (SEROQUEL) 25 MG tablet; Take 1 tablet (25 mg total) by mouth at bedtime.       I have changed Randy Flirt. Balgobin "Grady"'s citalopram. I am also having him start on QUEtiapine. Additionally, I am having him maintain his Potassium Chloride ER, aspirin EC, Insulin Pen Needle, amLODipine, cilostazol, hydrochlorothiazide, losartan, metFORMIN, OneTouch Verio, OneTouch Verio, [employer] 2 Reader, [employer], [employer], Trulicity, and pravastatin.  Allergies as of 09/05/2020   No Known Allergies     Medication List       Accurate as of September 05, 2020 11:30 AM. If you have any questions, ask your nurse or doctor.        amLODipine 10 MG tablet Commonly known as: NORVASC TAKE ONE (1) TABLET EACH DAY   aspirin EC 81 MG tablet Take 81 mg by mouth daily.   Basaglar KwikPen 100 UNIT/ML Inject 60 Units into the skin daily.   cilostazol 100 MG tablet Commonly known as: PLETAL Take 1 tablet (100 mg total) by mouth 2 (two) times daily.   citalopram 20 MG tablet Commonly known as: CELEXA Take 3 tablets (60 mg total) by mouth daily. What changed: how much to take Changed by: Claretta Fraise, MD   FreeStyle [place] 2 Reader [place]in Mo Use to check blood sugar 6 times daily. DX: E11.9   FreeStyle Libre 2 Sensor Misc Use to check blood sugar 6 times daily. Change sensor every 14 days. DX: E11.9   hydrochlorothiazide 25 MG tablet Commonly known as: HYDRODIURIL Take 1 tablet (25 mg total) by mouth daily.   Insulin Pen Needle 31G X 8 MM Misc Commonly known as: B-D ULTRAFINE III SHORT PEN 1 each by Does not apply route as directed.   losartan 100 MG tablet Commonly known as: COZAAR Take 1 tablet (100 mg total) by mouth daily.   metFORMIN 500 MG 24 hr tablet Commonly known as: Glucophage XR Take 2 tablets (1,000 mg total) by mouth in the morning and at bedtime.    OneTouch Verio test strip Generic drug: glucose blood 1 each by Other route 4 (four) times daily as needed for other.   OneTouch Verio w/Device Kit 1 each by Does not apply route 4 (four) times daily as needed.   Potassium Chloride ER 20 MEQ Tbcr Take 20 mEq by mouth daily.   pravastatin 80 MG tablet Commonly known as: PRAVACHOL  Take 1 tablet (80 mg total) by mouth daily.   QUEtiapine 25 MG tablet Commonly known as: SEROQUEL Take 1 tablet (25 mg total) by mouth at bedtime. Started by: Claretta Fraise, MD   Trulicity 1.5 VS/2.5GC Sopn Generic drug: Dulaglutide Inject 1.5 mg into the skin once a week.        Follow-up: Return in about 2 weeks (around 09/19/2020) for Depression, Anxiety with his PCP, Dr. Warrick Parisian.  Claretta Fraise, M.D.

## 2020-09-12 DIAGNOSIS — M25572 Pain in left ankle and joints of left foot: Secondary | ICD-10-CM | POA: Diagnosis not present

## 2020-09-12 DIAGNOSIS — S82832A Other fracture of upper and lower end of left fibula, initial encounter for closed fracture: Secondary | ICD-10-CM | POA: Diagnosis not present

## 2020-09-19 ENCOUNTER — Other Ambulatory Visit: Payer: Self-pay

## 2020-09-19 ENCOUNTER — Telehealth: Payer: Self-pay

## 2020-09-19 ENCOUNTER — Ambulatory Visit: Payer: Medicare PPO | Admitting: Family Medicine

## 2020-09-19 ENCOUNTER — Encounter: Payer: Self-pay | Admitting: Family Medicine

## 2020-09-19 VITALS — BP 143/73 | HR 103 | Ht 69.0 in | Wt 174.0 lb

## 2020-09-19 DIAGNOSIS — F339 Major depressive disorder, recurrent, unspecified: Secondary | ICD-10-CM

## 2020-09-19 DIAGNOSIS — F411 Generalized anxiety disorder: Secondary | ICD-10-CM | POA: Insufficient documentation

## 2020-09-19 NOTE — Progress Notes (Signed)
BP (!) 143/73   Pulse (!) 103   Ht '5\' 9"'  (1.753 m)   Wt 174 lb (78.9 kg)   SpO2 97%   BMI 25.70 kg/m    Subjective:   Patient ID: Randy [name], male    DOB: 1951-02-05, 70 y.o.   MRN: [id number]  HPI: Randy [name] is a 70 y.o. male presenting on 09/19/2020 for Depression and Anxiety (2 week follow up)   HPI Depression and anxiety recheck Patient feels improved and still has some anxiety and panic but less frequent. He feels medicine is helping and is improving.  He is sleeping well and has a good appetite. Denies suicidal ideation.  Depression screen [facility] 2/9 09/19/2020 09/05/2020 08/16/2020 08/01/2020 05/02/2020  Decreased Interest '3 3 1 ' 0 0  Down, Depressed, Hopeless 0 1 3 0 0  PHQ - 2 Score '3 4 4 ' 0 0  Altered sleeping '1 1 1 ' - -  Tired, decreased energy '1 1 1 ' - -  Change in appetite '1 1 1 ' - -  Feeling bad or failure about yourself  '3 3 3 ' - -  Trouble concentrating '3 3 1 ' - -  Moving slowly or fidgety/restless '1 1 1 ' - -  Suicidal thoughts 0 0 0 - -  PHQ-9 Score 13 14 - - -  Difficult doing work/chores - Very difficult - - -     Relevant past medical, surgical, family and social history reviewed and updated as indicated. Interim medical history since our last visit reviewed. Allergies and medications reviewed and updated.  Review of Systems  Constitutional: Negative for chills and fever.  Eyes: Negative for visual disturbance.  Respiratory: Negative for shortness of breath and wheezing.   Cardiovascular: Negative for chest pain and leg swelling.  Musculoskeletal: Negative for back pain and gait problem.  Skin: Negative for rash.  Neurological: Negative for dizziness, weakness and numbness.  Psychiatric/Behavioral: Positive for dysphoric mood and sleep disturbance. Negative for self-injury and suicidal ideas. The patient is nervous/anxious.   All other systems reviewed and are negative.   Per HPI unless specifically indicated above   Allergies as of 09/19/2020    No Known Allergies     Medication List       Accurate as of September 19, 2020 10:15 AM. If you have any questions, ask your nurse or doctor.        amLODipine 10 MG tablet Commonly known as: NORVASC TAKE ONE (1) TABLET EACH DAY   aspirin EC 81 MG tablet Take 81 mg by mouth daily.   Basaglar KwikPen 100 UNIT/ML Inject 60 Units into the skin daily.   cilostazol 100 MG tablet Commonly known as: PLETAL Take 1 tablet (100 mg total) by mouth 2 (two) times daily.   citalopram 20 MG tablet Commonly known as: CELEXA Take 3 tablets (60 mg total) by mouth daily.   FreeStyle Libre 2 Reader [place] Use to check blood sugar 6 times daily. DX: E11.9   FreeStyle Libre 2 Sensor Misc Use to check blood sugar 6 times daily. Change sensor every 14 days. DX: E11.9   hydrochlorothiazide 25 MG tablet Commonly known as: HYDRODIURIL Take 1 tablet (25 mg total) by mouth daily.   Insulin Pen Needle 31G X 8 MM Misc Commonly known as: B-D ULTRAFINE III SHORT PEN 1 each by Does not apply route as directed.   losartan 100 MG tablet Commonly known as: COZAAR Take 1 tablet (100 mg total) by mouth daily.   metFORMIN 500  MG 24 hr tablet Commonly known as: Glucophage XR Take 2 tablets (1,000 mg total) by mouth in the morning and at bedtime.   OneTouch Verio test strip Generic drug: glucose blood 1 each by Other route 4 (four) times daily as needed for other.   OneTouch Verio w/Device Kit 1 each by Does not apply route 4 (four) times daily as needed.   Potassium Chloride ER 20 MEQ Tbcr Take 20 mEq by mouth daily.   pravastatin 80 MG tablet Commonly known as: PRAVACHOL Take 1 tablet (80 mg total) by mouth daily.   QUEtiapine 25 MG tablet Commonly known as: SEROQUEL Take 1 tablet (25 mg total) by mouth at bedtime.   Trulicity 1.5 TV/8.1SY Sopn Generic drug: Dulaglutide Inject 1.5 mg into the skin once a week.        Objective:   BP (!) 143/73   Pulse (!) 103   Ht '5\' 9"'  (1.753 m)    Wt 174 lb (78.9 kg)   SpO2 97%   BMI 25.70 kg/m   Wt Readings from Last 3 Encounters:  09/19/20 174 lb (78.9 kg)  09/05/20 174 lb 6.4 oz (79.1 kg)  08/16/20 179 lb (81.2 kg)    Physical Exam Vitals and nursing note reviewed.  Constitutional:      General: He is not in acute distress.    Appearance: He is well-developed. He is not diaphoretic.  Neck:     Thyroid: No thyromegaly.  Skin:    General: Skin is warm and dry.     Findings: No rash.  Neurological:     Mental Status: He is alert and oriented to person, place, and time.     Coordination: Coordination normal.  Psychiatric:        Mood and Affect: Mood is anxious. Mood is not depressed.        Behavior: Behavior normal.        Thought Content: Thought content does not include suicidal ideation. Thought content does not include suicidal plan.       Assessment & Plan:   Problem List Items Addressed This Visit      Other   Depression, recurrent ([place]) - Primary   GAD (generalized anxiety disorder)      Continue current medication, is getting into his system, it seems like he is improving, no changes for now. Follow up plan: Return if symptoms worsen or fail to improve, for Keep appointment in May for usual follow-up and for depression and anxiety..  Counseling provided for all of the vaccine components No orders of the defined types were placed in this encounter.   Caryl Pina, MD [facility] 09/19/2020, 10:15 AM

## 2020-09-19 NOTE — Telephone Encounter (Signed)
Sent to plan  Randy [name]  Key: [id number] - PA Case ID: [id number] Need help? Call us at [phone] Status Sent to [facility] Citalopram Hydrobromide 20MG  tablets Form [employer] Electronic PA Form

## 2020-09-20 NOTE — Telephone Encounter (Signed)
[facility] aware

## 2020-09-20 NOTE — Telephone Encounter (Signed)
[employer] PA Case: [id number], Status: Approved, Coverage Starts on: 06/09/2020 12:00:00 AM, Coverage Ends on: 06/08/2021 12:00:00 AM. Questions? Contact [phone].

## 2020-10-11 DIAGNOSIS — Z6826 Body mass index (BMI) 26.0-26.9, adult: Secondary | ICD-10-CM | POA: Diagnosis not present

## 2020-10-11 DIAGNOSIS — S82832D Other fracture of upper and lower end of left fibula, subsequent encounter for closed fracture with routine healing: Secondary | ICD-10-CM | POA: Diagnosis not present

## 2020-10-11 DIAGNOSIS — S82832A Other fracture of upper and lower end of left fibula, initial encounter for closed fracture: Secondary | ICD-10-CM | POA: Diagnosis not present

## 2020-10-11 DIAGNOSIS — M79676 Pain in unspecified toe(s): Secondary | ICD-10-CM | POA: Diagnosis not present

## 2020-10-11 DIAGNOSIS — Z9889 Other specified postprocedural states: Secondary | ICD-10-CM | POA: Diagnosis not present

## 2020-10-11 DIAGNOSIS — L84 Corns and callosities: Secondary | ICD-10-CM | POA: Diagnosis not present

## 2020-10-11 DIAGNOSIS — E1151 Type 2 diabetes mellitus with diabetic peripheral angiopathy without gangrene: Secondary | ICD-10-CM | POA: Diagnosis not present

## 2020-10-11 DIAGNOSIS — B351 Tinea unguium: Secondary | ICD-10-CM | POA: Diagnosis not present

## 2020-10-17 DIAGNOSIS — S82832K Other fracture of upper and lower end of left fibula, subsequent encounter for closed fracture with nonunion: Secondary | ICD-10-CM | POA: Diagnosis not present

## 2020-10-31 ENCOUNTER — Telehealth: Payer: Self-pay | Admitting: Pharmacist

## 2020-10-31 ENCOUNTER — Encounter: Payer: Self-pay | Admitting: Family Medicine

## 2020-10-31 ENCOUNTER — Ambulatory Visit: Payer: Medicare PPO | Admitting: Family Medicine

## 2020-10-31 ENCOUNTER — Other Ambulatory Visit: Payer: Self-pay

## 2020-10-31 VITALS — BP 122/68 | HR 77 | Temp 98.1°F | Resp 20 | Ht 69.0 in | Wt 181.0 lb

## 2020-10-31 DIAGNOSIS — E119 Type 2 diabetes mellitus without complications: Secondary | ICD-10-CM | POA: Diagnosis not present

## 2020-10-31 DIAGNOSIS — I10 Essential (primary) hypertension: Secondary | ICD-10-CM | POA: Diagnosis not present

## 2020-10-31 DIAGNOSIS — E782 Mixed hyperlipidemia: Secondary | ICD-10-CM | POA: Diagnosis not present

## 2020-10-31 DIAGNOSIS — E1169 Type 2 diabetes mellitus with other specified complication: Secondary | ICD-10-CM

## 2020-10-31 DIAGNOSIS — Z794 Long term (current) use of insulin: Secondary | ICD-10-CM

## 2020-10-31 LAB — BAYER DCA HB A1C WAIVED: HB A1C (BAYER DCA - WAIVED): 8.9 % — ABNORMAL HIGH (ref <7.0)

## 2020-10-31 MED ORDER — TRULICITY 3 MG/0.5ML ~~LOC~~ SOAJ: 3.0000 mg | SUBCUTANEOUS | 3 refills | Status: DC

## 2020-10-31 NOTE — Telephone Encounter (Signed)
New rx submitted to [employer] patient assistance program trulicity dose increased to 3mg  sq weekly A1c has increased Faxed to [phone]

## 2020-10-31 NOTE — Progress Notes (Signed)
BP 122/68   Pulse 77   Temp 98.1 F (36.7 C)   Resp 20   Ht '5\' 9"'  (1.753 m)   Wt 181 lb (82.1 kg)   SpO2 96%   BMI 26.73 kg/m    Subjective:   Patient ID: Randy [name], male    DOB: [birth year], 70 y.o.   MRN: [id number]  HPI: JOHNDAVID [name] is a 70 y.o. male presenting on 10/31/2020 for Medical Management of Chronic Issues and Diabetes   HPI Type 2 diabetes mellitus Patient comes in today for recheck of his diabetes. Patient has been currently taking Basaglar and Trulicity and metformin. Patient is currently on an ACE inhibitor/ARB. Patient has not seen an ophthalmologist this year. Patient denies any issues with their feet. The symptom started onset as an adult hypertension and hyperlipidemia ARE RELATED TO DM   Hypertension Patient is currently on amlodipine and hydrochlorothiazide and losartan, and their blood pressure today is 122/68. Patient denies any lightheadedness or dizziness. Patient denies headaches, blurred vision, chest pains, shortness of breath, or weakness. Denies any side effects from medication and is content with current medication.   Hyperlipidemia Patient is coming in for recheck of his hyperlipidemia. The patient is currently taking pravastatin. They deny any issues with myalgias or history of liver damage from it. They deny any focal numbness or weakness or chest pain.   Relevant past medical, surgical, family and social history reviewed and updated as indicated. Interim medical history since our last visit reviewed. Allergies and medications reviewed and updated.  Review of Systems  Constitutional: Negative for chills and fever.  Eyes: Negative for visual disturbance.  Respiratory: Negative for shortness of breath and wheezing.   Cardiovascular: Negative for chest pain and leg swelling.  Musculoskeletal: Negative for back pain and gait problem.  Skin: Negative for rash.  Neurological: Negative for dizziness, weakness and light-headedness.  All  other systems reviewed and are negative.   Per HPI unless specifically indicated above   Allergies as of 10/31/2020   No Known Allergies     Medication List       Accurate as of Oct 31, 2020  8:07 AM. If you have any questions, ask your nurse or doctor.        amLODipine 10 MG tablet Commonly known as: NORVASC TAKE ONE (1) TABLET EACH DAY   aspirin EC 81 MG tablet Take 81 mg by mouth daily.   Basaglar KwikPen 100 UNIT/ML Inject 60 Units into the skin daily.   cilostazol 100 MG tablet Commonly known as: PLETAL Take 1 tablet (100 mg total) by mouth 2 (two) times daily.   citalopram 20 MG tablet Commonly known as: CELEXA Take 3 tablets (60 mg total) by mouth daily.   FreeStyle Libre 2 Reader [place] Use to check blood sugar 6 times daily. DX: E11.9   FreeStyle Libre 2 Sensor Misc Use to check blood sugar 6 times daily. Change sensor every 14 days. DX: E11.9   hydrochlorothiazide 25 MG tablet Commonly known as: HYDRODIURIL Take 1 tablet (25 mg total) by mouth daily.   Insulin Pen Needle 31G X 8 MM Misc Commonly known as: B-D ULTRAFINE III SHORT PEN 1 each by Does not apply route as directed.   losartan 100 MG tablet Commonly known as: COZAAR Take 1 tablet (100 mg total) by mouth daily.   metFORMIN 500 MG 24 hr tablet Commonly known as: Glucophage XR Take 2 tablets (1,000 mg total) by mouth in the morning and  at bedtime.   OneTouch Verio test strip Generic drug: glucose blood 1 each by Other route 4 (four) times daily as needed for other.   OneTouch Verio w/Device Kit 1 each by Does not apply route 4 (four) times daily as needed.   Potassium Chloride ER 20 MEQ Tbcr Take 20 mEq by mouth daily.   pravastatin 80 MG tablet Commonly known as: PRAVACHOL Take 1 tablet (80 mg total) by mouth daily.   QUEtiapine 25 MG tablet Commonly known as: SEROQUEL Take 1 tablet (25 mg total) by mouth at bedtime.   Trulicity 1.5 XY/3.3XO Sopn Generic drug:  Dulaglutide Inject 1.5 mg into the skin once a week.        Objective:   BP 122/68   Pulse 77   Temp 98.1 F (36.7 C)   Resp 20   Ht '5\' 9"'  (1.753 m)   Wt 181 lb (82.1 kg)   SpO2 96%   BMI 26.73 kg/m   Wt Readings from Last 3 Encounters:  10/31/20 181 lb (82.1 kg)  09/19/20 174 lb (78.9 kg)  09/05/20 174 lb 6.4 oz (79.1 kg)    Physical Exam Vitals and nursing note reviewed.  Constitutional:      General: He is not in acute distress.    Appearance: He is well-developed. He is not diaphoretic.  Eyes:     General: No scleral icterus.    Conjunctiva/sclera: Conjunctivae normal.  Neck:     Thyroid: No thyromegaly.  Cardiovascular:     Rate and Rhythm: Normal rate and regular rhythm.     Heart sounds: Normal heart sounds. No murmur heard.   Pulmonary:     Effort: Pulmonary effort is normal. No respiratory distress.     Breath sounds: Normal breath sounds. No wheezing.  Musculoskeletal:        General: Normal range of motion.     Cervical back: Neck supple.  Lymphadenopathy:     Cervical: No cervical adenopathy.  Skin:    General: Skin is warm and dry.     Findings: No rash.  Neurological:     Mental Status: He is alert and oriented to person, place, and time.     Coordination: Coordination normal.  Psychiatric:        Behavior: Behavior normal.       Assessment & Plan:   Problem List Items Addressed This Visit      Cardiovascular and Mediastinum   Essential hypertension, benign   Relevant Orders   CBC with Differential/Platelet   CMP14+EGFR     Endocrine   Type 2 diabetes mellitus with other specified complication ([place])   Relevant Medications   Dulaglutide (TRULICITY) 3 VA/9.1BT SOPN     Other   Mixed hyperlipidemia   Relevant Orders   CBC with Differential/Platelet   Lipid panel    Other Visit Diagnoses    Type 2 diabetes mellitus without complication, without long-term current use of insulin ([facility])    -  Primary   Relevant Medications    Dulaglutide (TRULICITY) 70 YO/0.69YO SOPN   Other Relevant Orders   Bayer DCA Hb A1c Waived   CBC with Differential/Platelet   Bayer DCA Hb A1c Waived      Patient's blood pressure looks good, continue with medications, A1c is 8.9.  Increase Trulicity to 3 mg weekly No other change in medications, rest of labs pending Follow up plan: Return in about 3 months (around 01/31/2021), or if symptoms worsen or fail to improve, for Diabetes  hypertension and anxiety..  Counseling provided for all of the vaccine components Orders Placed This Encounter  Procedures  . Bayer DCA Hb A1c Waived  . CBC with Differential/Platelet  . CMP14+EGFR  . Lipid panel    Caryl Pina, MD [facility] 10/31/2020, 8:07 AM

## 2020-10-31 NOTE — Telephone Encounter (Signed)
Libre 2 CGM order submitted to [facility] via [facility] portal; order pending

## 2020-11-01 LAB — CMP14+EGFR
ALT: 18 IU/L (ref 0–44)
AST: 15 IU/L (ref 0–40)
Albumin/Globulin Ratio: 2.4 — ABNORMAL HIGH (ref 1.2–2.2)
Albumin: 4.7 g/dL (ref 3.8–4.8)
Alkaline Phosphatase: 101 IU/L (ref 44–121)
BUN/Creatinine Ratio: 21 (ref 10–24)
BUN: 23 mg/dL (ref 8–27)
Bilirubin Total: 0.5 mg/dL (ref 0.0–1.2)
CO2: 26 mmol/L (ref 20–29)
Calcium: 10.4 mg/dL — ABNORMAL HIGH (ref 8.6–10.2)
Chloride: 97 mmol/L (ref 96–106)
Creatinine, Ser: 1.08 mg/dL (ref 0.76–1.27)
Globulin, Total: 2 g/dL (ref 1.5–4.5)
Glucose: 116 mg/dL — ABNORMAL HIGH (ref 65–99)
Potassium: 3.5 mmol/L (ref 3.5–5.2)
Sodium: 139 mmol/L (ref 134–144)
Total Protein: 6.7 g/dL (ref 6.0–8.5)
eGFR: 75 mL/min/{1.73_m2} (ref >59)

## 2020-11-01 LAB — CBC WITH DIFFERENTIAL/PLATELET
Basophils Absolute: 0.1 10*3/uL (ref 0.0–0.2)
Basos: 1 %
EOS (ABSOLUTE): 0.2 10*3/uL (ref 0.0–0.4)
Eos: 3 %
Hematocrit: 43.5 % (ref 37.5–51.0)
Hemoglobin: 15.6 g/dL (ref 13.0–17.7)
Immature Grans (Abs): 0.1 10*3/uL (ref 0.0–0.1)
Immature Granulocytes: 1 %
Lymphocytes Absolute: 2.3 10*3/uL (ref 0.7–3.1)
Lymphs: 25 %
MCH: 33.5 pg — ABNORMAL HIGH (ref 26.6–33.0)
MCHC: 35.9 g/dL — ABNORMAL HIGH (ref 31.5–35.7)
MCV: 93 fL (ref 79–97)
Monocytes Absolute: 0.8 10*3/uL (ref 0.1–0.9)
Monocytes: 8 %
Neutrophils Absolute: 5.8 10*3/uL (ref 1.4–7.0)
Neutrophils: 62 %
Platelets: 208 10*3/uL (ref 150–450)
RBC: 4.66 x10E6/uL (ref 4.14–5.80)
RDW: 12.9 % (ref 11.6–15.4)
WBC: 9.2 10*3/uL (ref 3.4–10.8)

## 2020-11-01 LAB — LIPID PANEL
Chol/HDL Ratio: 3.6 ratio (ref 0.0–5.0)
Cholesterol, Total: 174 mg/dL (ref 100–199)
HDL: 48 mg/dL (ref >39)
LDL Chol Calc (NIH): 100 mg/dL — ABNORMAL HIGH (ref 0–99)
Triglycerides: 150 mg/dL — ABNORMAL HIGH (ref 0–149)
VLDL Cholesterol Cal: 26 mg/dL (ref 5–40)

## 2020-11-16 ENCOUNTER — Ambulatory Visit (INDEPENDENT_AMBULATORY_CARE_PROVIDER_SITE_OTHER): Payer: Medicare PPO | Admitting: Pharmacist

## 2020-11-16 ENCOUNTER — Other Ambulatory Visit: Payer: Self-pay

## 2020-11-16 DIAGNOSIS — E119 Type 2 diabetes mellitus without complications: Secondary | ICD-10-CM

## 2020-11-16 NOTE — Progress Notes (Signed)
    11/16/2020 Name: Randy [name] MRN: [id number] DOB: 1951-06-04   SS:  58 YOM Presents for diabetes evaluation, education, and management Patient was referred and last seen by Primary Care Provider on 04/25/20.   Insurance coverage/medication affordability: [employer] medicare   Patient reports adherence with medications. Current diabetes medications include: new start trulicity, basaglar, metformin Current hypertension medications include: losartan, hctz, amlodipine Goal 130/80 Current hyperlipidemia medications include: pravastatin   Patient denies hypoglycemic events.   Patient reports nocturia (nighttime urination).   Patient denies neuropathy (nerve pain).   Patient denies visual changes.      O:  Lab Results  Component Value Date   HGBA1C 8.9 (H) 10/31/2020   Lipid Panel     Component Value Date/Time   CHOL 174 10/31/2020 0800   TRIG 150 (H) 10/31/2020 0800   HDL 48 10/31/2020 0800   CHOLHDL 3.6 10/31/2020 0800   LDLCALC 100 (H) 10/31/2020 0800     Home fasting blood sugars: 155, 207, 223, 220, 146, 143, 227, 150, 167, 105  2 hour post-meal/random blood sugars: n/a.    Clinical Atherosclerotic Cardiovascular Disease (ASCVD): No   The 10-year ASCVD risk score Mikey Bussing DC Jr., et al., 2013) is: 28.5%   Values used to calculate the score:     Age: 36 years     Sex: Male     Is Non-Hispanic African American: No     Diabetic: Yes     Tobacco smoker: No     Systolic Blood Pressure: 212 mmHg     Is BP treated: Yes     HDL Cholesterol: 48 mg/dL     Total Cholesterol: 174 mg/dL    A/P:   Diabetes T2DM currently UNCONTROLLED. Patient is adherent with medication. Control is suboptimal due to diet/lifestyle.   LIBRE 2 CGM cost too expensive at [facility] --$100 for 1st fill, then $50 thereafter -->will try a different company  APPLICATION FOR RE-ENROLLMENT [employer] SENT TO COMPANY FOR TRULICITY AND BASAGLAR   COMPANY  -[employer] (TRULICITY & BASAGLAR) YOU HAVE TO CALL-->[phone], PRESS 1 AND STATE YOU ARE CALLING TO SET UP SHIPMENT WAIT TIMES CAN BE UP TO 1 HOUR ENROLLED IN AUTOMATIC REFILLS MEDICATION WILL SHIP EVERY 4 MONTHS TO YOUR HOUSE   -Continue basaglar 60 units daily, trulicity increased to 3mg  sq weekly, and metformin  -Pen needles given to patient to use for insulin injections  -Extensively discussed pathophysiology of diabetes, recommended lifestyle interventions, dietary effects on blood sugar control  -Counseled on s/sx of and management of hypoglycemia  -Next A1C anticipated 01/2021.   Written patient instructions provided.  Total time in face to face counseling 25 minutes.    Regina Eck, PharmD, BCPS Clinical Pharmacist, [place]  II Phone [phone]

## 2020-11-22 DIAGNOSIS — S82832A Other fracture of upper and lower end of left fibula, initial encounter for closed fracture: Secondary | ICD-10-CM | POA: Diagnosis not present

## 2020-12-17 ENCOUNTER — Other Ambulatory Visit: Payer: Self-pay | Admitting: Family Medicine

## 2020-12-17 DIAGNOSIS — F411 Generalized anxiety disorder: Secondary | ICD-10-CM

## 2020-12-17 DIAGNOSIS — F339 Major depressive disorder, recurrent, unspecified: Secondary | ICD-10-CM

## 2020-12-20 DIAGNOSIS — E1151 Type 2 diabetes mellitus with diabetic peripheral angiopathy without gangrene: Secondary | ICD-10-CM | POA: Diagnosis not present

## 2020-12-20 DIAGNOSIS — L84 Corns and callosities: Secondary | ICD-10-CM | POA: Diagnosis not present

## 2020-12-20 DIAGNOSIS — B351 Tinea unguium: Secondary | ICD-10-CM | POA: Diagnosis not present

## 2020-12-20 DIAGNOSIS — M79676 Pain in unspecified toe(s): Secondary | ICD-10-CM | POA: Diagnosis not present

## 2021-01-03 DIAGNOSIS — M25572 Pain in left ankle and joints of left foot: Secondary | ICD-10-CM | POA: Diagnosis not present

## 2021-01-25 ENCOUNTER — Encounter (HOSPITAL_COMMUNITY): Payer: Self-pay

## 2021-01-25 ENCOUNTER — Other Ambulatory Visit: Payer: Self-pay

## 2021-01-25 ENCOUNTER — Emergency Department (HOSPITAL_COMMUNITY)
Admission: EM | Admit: 2021-01-25 | Discharge: 2021-01-25 | Disposition: A | Discharge status: Home / Self Care | Payer: Medicare PPO | Attending: Emergency Medicine | Admitting: Emergency Medicine

## 2021-01-25 DIAGNOSIS — Z79899 Other long term (current) drug therapy: Secondary | ICD-10-CM | POA: Insufficient documentation

## 2021-01-25 DIAGNOSIS — B029 Zoster without complications: Secondary | ICD-10-CM | POA: Diagnosis not present

## 2021-01-25 DIAGNOSIS — Z794 Long term (current) use of insulin: Secondary | ICD-10-CM | POA: Insufficient documentation

## 2021-01-25 DIAGNOSIS — Z7982 Long term (current) use of aspirin: Secondary | ICD-10-CM | POA: Diagnosis not present

## 2021-01-25 DIAGNOSIS — I10 Essential (primary) hypertension: Secondary | ICD-10-CM | POA: Insufficient documentation

## 2021-01-25 DIAGNOSIS — R21 Rash and other nonspecific skin eruption: Secondary | ICD-10-CM

## 2021-01-25 DIAGNOSIS — E119 Type 2 diabetes mellitus without complications: Secondary | ICD-10-CM | POA: Insufficient documentation

## 2021-01-25 DIAGNOSIS — Z7984 Long term (current) use of oral hypoglycemic drugs: Secondary | ICD-10-CM | POA: Insufficient documentation

## 2021-01-25 DIAGNOSIS — W19XXXA Unspecified fall, initial encounter: Secondary | ICD-10-CM | POA: Diagnosis not present

## 2021-01-25 DIAGNOSIS — Z87891 Personal history of nicotine dependence: Secondary | ICD-10-CM | POA: Diagnosis not present

## 2021-01-25 DIAGNOSIS — Z20822 Contact with and (suspected) exposure to covid-19: Secondary | ICD-10-CM | POA: Diagnosis not present

## 2021-01-25 DIAGNOSIS — R079 Chest pain, unspecified: Secondary | ICD-10-CM | POA: Diagnosis not present

## 2021-01-25 NOTE — ED Triage Notes (Signed)
Pt arrived via POV c/o new development of Shingles rash. Pt first noticed the rash 3 days ago on right chest going to his back.

## 2021-01-25 NOTE — ED Provider Notes (Signed)
[facility] EMERGENCY DEPARTMENT Provider Note   CSN: [id number] Arrival date & time: 01/25/21  1833     History Chief Complaint  Patient presents with   Rash    Randy [name] is a 70 y.o. male.  HPI  Patient with significant medical history of diabetes, hyperlipidemia, hypertension presents to the emergency department with chief complaint of a fall as well as a rash.  Patient states that he developed a rash on the right side of his back and has now progressed onto the right side of his chest, he states the rash stings and burns, he denies pruritus, states the rash does not past the midline, patient does have some slight drainage denies worsening redness, denies tongue, throat, lip swelling, denies  GI symptoms, denies  fevers or chills.  Patient has never had this in the past, but he thinks this might be shingles.  He went to urgent care today where they diagnosed with shingles and placed him on Valtrex.  He also notes that he lost his balance and fell today.  He states he fell onto his back, he denies hitting his head, losing conscious, is not on anticoagulant.  He denies headaches, change in vision, paresthesias or weakness upper/ lower extremities, states that he simply lost his balance and fell.  He states he had a hard time getting up.  He has no complaints at this time.  He is not endorse neck pain, chest pain, abdominal pain, Patient states he is here because he just wanted to be checked out.  Past Medical History:  Diagnosis Date   Diabetes mellitus without complication ([place])    Hyperlipidemia    Hypertension     Patient Active Problem List   Diagnosis Date Noted   GAD (generalized anxiety disorder) 09/19/2020   Atherosclerosis of native arteries of extremity with intermittent claudication ([place]) 10/21/2017   Anaphylactic syndrome 10/21/2016   Current smoker 06/04/2016   Depression, recurrent ([place]) 06/04/2016   Type 2 diabetes mellitus with other specified complication  ([place]) 10/93/2355   Essential hypertension, benign 05/09/2015   Mixed hyperlipidemia 05/09/2015    Past Surgical History:  Procedure Laterality Date   HEMORRHOID SURGERY N/A 06/08/2013   Procedure: HEMORRHOIDECTOMY;  Surgeon: Leighton Ruff, MD;  Location: [facility];  Service: General;  Laterality: N/A;   NO PAST SURGERIES         History reviewed. No pertinent family history.  Social History   Tobacco Use   Smoking status: Former    Packs/day: 1.00    Years: 40.00    Pack years: 40.00    Types: Pipe, Cigarettes    Quit date: 02/08/2020    Years since quitting: 0.9   Smokeless tobacco: Never   Tobacco comments:    Less than 1 pk per day  Vaping Use   Vaping Use: Never used  Substance Use Topics   Alcohol use: Never    Comment: rarely   Drug use: No    Home Medications Prior to Admission medications   Medication Sig Start Date End Date Taking? Authorizing Provider  amLODipine (NORVASC) 10 MG tablet TAKE ONE (1) TABLET EACH DAY 05/02/20   Dettinger, Fransisca Kaufmann, MD  aspirin EC 81 MG tablet Take 81 mg by mouth daily.    [provider]  Blood Glucose Monitoring Suppl (ONETOUCH VERIO) w/Device KIT 1 each by Does not apply route 4 (four) times daily as needed. 05/02/20   Dettinger, Fransisca Kaufmann, MD  cilostazol (PLETAL) 100 MG  tablet Take 1 tablet (100 mg total) by mouth 2 (two) times daily. 05/02/20   Dettinger, Fransisca Kaufmann, MD  citalopram (CELEXA) 20 MG tablet TAKE ONE (1) TABLET BY MOUTH EVERY DAY 12/17/20   Dettinger, Fransisca Kaufmann, MD  Continuous Blood Gluc Receiver (FREESTYLE LIBRE 2 READER) DEVI Use to check blood sugar 6 times daily. DX: E11.9 05/02/20   Dettinger, Fransisca Kaufmann, MD  Continuous Blood Gluc Sensor (FREESTYLE LIBRE 2 SENSOR) MISC Use to check blood sugar 6 times daily. Change sensor every 14 days. DX: E11.9 05/02/20   Dettinger, Fransisca Kaufmann, MD  Dulaglutide (TRULICITY) 3 RJ/1.8AC SOPN Inject 3 mg as directed once a week. 10/31/20   Dettinger, Fransisca Kaufmann, MD   hydrochlorothiazide (HYDRODIURIL) 25 MG tablet Take 1 tablet (25 mg total) by mouth daily. 05/02/20   Dettinger, Fransisca Kaufmann, MD  Insulin Glargine (BASAGLAR KWIKPEN) 100 UNIT/ML Inject 60 Units into the skin daily. 06/06/20   Dettinger, Fransisca Kaufmann, MD  Insulin Pen Needle (B-D ULTRAFINE III SHORT PEN) 31G X 8 MM MISC 1 each by Does not apply route as directed. 12/24/16   Cassandria Anger, MD  losartan (COZAAR) 100 MG tablet Take 1 tablet (100 mg total) by mouth daily. 05/02/20   Dettinger, Fransisca Kaufmann, MD  metFORMIN (GLUCOPHAGE XR) 500 MG 24 hr tablet Take 2 tablets (1,000 mg total) by mouth in the morning and at bedtime. 05/02/20   Dettinger, Fransisca Kaufmann, MD  [facility] VERIO test strip 1 each by Other route 4 (four) times daily as needed for other. 05/02/20   Dettinger, Fransisca Kaufmann, MD  Potassium Chloride ER 20 MEQ TBCR Take 20 mEq by mouth daily.     [provider]  pravastatin (PRAVACHOL) 80 MG tablet Take 1 tablet (80 mg total) by mouth daily. 08/01/20   Dettinger, Fransisca Kaufmann, MD  QUEtiapine (SEROQUEL) 25 MG tablet Take 1 tablet (25 mg total) by mouth at bedtime. 09/05/20   Claretta Fraise, MD    Allergies    Patient has no known allergies.  Review of Systems   Review of Systems  Constitutional:  Negative for chills and fever.  HENT:  Negative for congestion.   Eyes:  Negative for visual disturbance.  Respiratory:  Negative for shortness of breath.   Cardiovascular:  Negative for chest pain.  Gastrointestinal:  Negative for abdominal pain.  Genitourinary:  Negative for enuresis.  Musculoskeletal:  Negative for back pain.  Skin:  Positive for rash.  Neurological:  Negative for dizziness, weakness and headaches.  Hematological:  Does not bruise/bleed easily.   Physical Exam Updated Vital Signs BP (!) 155/93 (BP Location: Right Arm)   Pulse 66   Temp 98.9 F (37.2 C) (Oral)   Resp 18   Ht '5\' 9"'  (1.753 m)   Wt 84.4 kg   SpO2 95%   BMI 27.47 kg/m   Physical Exam Vitals and nursing  note reviewed.  Constitutional:      General: He is not in acute distress.    Appearance: He is not ill-appearing.  HENT:     Head: Normocephalic and atraumatic.     Comments: There was palpated is nontender to palpation no gross deformities present.    Nose: No congestion.     Mouth/Throat:     Comments: No oral lesions present. Eyes:     Extraocular Movements: Extraocular movements intact.     Conjunctiva/sclera: Conjunctivae normal.     Pupils: Pupils are equal, round, and reactive to light.  Cardiovascular:  Rate and Rhythm: Normal rate and regular rhythm.     Pulses: Normal pulses.     Heart sounds: No murmur heard.   No friction rub. No gallop.  Pulmonary:     Effort: No respiratory distress.     Breath sounds: No wheezing, rhonchi or rales.  Abdominal:     Palpations: Abdomen is soft.     Tenderness: There is no abdominal tenderness. There is no right CVA tenderness or left CVA tenderness.  Musculoskeletal:     Cervical back: No tenderness.     Comments: Patient has 5 of 5 strength, full range of motion the upper and lower extremities.  Spine was palpated was nontender to palpation.  Skin:    General: Skin is warm and dry.     Comments: Patient has a vesicle/papule like rash on his right upper back which follows a dermatome on into his right chest, does not cross the midline, slight erythema, no active drainage or discharge present, no fluctuant induration present.  No rash noted on patient's hands or feet.  Neurological:     Mental Status: He is alert.     GCS: GCS eye subscore is 4. GCS verbal subscore is 5. GCS motor subscore is 6.     Sensory: Sensation is intact.     Motor: No weakness.     Coordination: Romberg sign negative. Finger-Nose-Finger Test normal.     Gait: Gait is intact.     Comments: Cranial nerves II through XII are grossly intact, patient is having no difficulty word finding, no slurring of words, able to follow two-step commands, no unilateral  weakness, no difficulty ambulation.  Psychiatric:        Mood and Affect: Mood normal.    ED Results / Procedures / Treatments   Labs (all labs ordered are listed, but only abnormal results are displayed) Labs Reviewed - No data to display  EKG None  Radiology No results found.  Procedures Procedures   Medications Ordered in ED Medications - No data to display  ED Course  I have reviewed the triage vital signs and the nursing notes.  Pertinent labs & imaging results that were available during my care of the patient were reviewed by me and considered in my medical decision making (see chart for details).    MDM Rules/Calculators/A&P                          Initial impression-patient presents after a fall as well as a rash on his body.  He is alert, does not appear in distress, vital signs reassuring.  Work-up-due to well-appearing patient, benign for exam, further lab or imaging are not warranted at this time.  Rule out- low suspicion for intracranial head bleed as patient denies loss of conscious, is not on anticoagulant, she does not endorse headaches, paresthesia/weakness in the upper and lower extremities, no focal deficits present on my exam.  Low suspicion for spinal cord abnormality or spinal fracture spine was palpated was nontender to palpation, patient has full range of motion in the upper and lower extremities.  Low suspicion for pneumothorax as lung sounds are clear chest is nontender to palpation will defer imaging at this time. Low suspicion for intra-abdominal trauma as abdomen soft nontender to palpation.  Low suspicion for TEN or Remo Lipps Johnson's as there is no noted skin sloughing, no oral lesions present.  Low suspicion for anaphylactic shock as there is no systemic rash, vital  signs reassuring, no GI symptoms.  Low suspicion for tick born illness as presentation atypical for etiology.  Low suspicion for systemic infection as patient is nontoxic-appearing, vital  signs reassuring.  Plan-unfortunately patient eloped prior to discharge, if patient comes back normal happy to reassess the patient.   Final Clinical Impression(s) / ED Diagnoses Final diagnoses:  Rash  Fall, initial encounter    Rx / DC Orders ED Discharge Orders     None        Aron Baba 01/25/21 2239    Daleen Bo, MD 01/26/21 1339

## 2021-01-28 ENCOUNTER — Encounter: Payer: Self-pay | Admitting: Nurse Practitioner

## 2021-01-28 ENCOUNTER — Ambulatory Visit (INDEPENDENT_AMBULATORY_CARE_PROVIDER_SITE_OTHER): Payer: Medicare PPO | Admitting: Nurse Practitioner

## 2021-01-28 DIAGNOSIS — B0229 Other postherpetic nervous system involvement: Secondary | ICD-10-CM

## 2021-01-28 MED ORDER — GABAPENTIN 100 MG PO CAPS: 100.0000 mg | ORAL_CAPSULE | Freq: Three times a day (TID) | ORAL | 0 refills | Status: DC

## 2021-01-28 NOTE — Progress Notes (Signed)
   Virtual Visit  Note Due to COVID-19 pandemic this visit was conducted virtually. This visit type was conducted due to national recommendations for restrictions regarding the COVID-19 Pandemic (e.g. social distancing, sheltering in place) in an effort to limit this patient's exposure and mitigate transmission in our community. All issues noted in this document were discussed and addressed.  A physical exam was not performed with this format.  I connected with Randy [name] on 01/28/21 at 2:58 by telephone and verified that I am speaking with the correct person using two identifiers. Randy [name] is currently located at home and no one  is currently with him during visit. The provider, Mary-Margaret Hassell Done, FNP is located in their office at time of visit.  I discussed the limitations, risks, security and privacy concerns of performing an evaluation and management service by telephone and the availability of in person appointments. I also discussed with the patient that there may be a patient responsible charge related to this service. The patient expressed understanding and agreed to proceed.   History and Present Illness:   Chief Complaint: Herpes Zoster   HPI Patient states he developed a rash on right side of chest that radiates around to back. He says it is very painful. Rate 10/10. Nothing makes better or worse. He went to urgent care on Friday and was given valtrex. He is in a lot of pain and needs some relief    Review of Systems  Constitutional: Negative.   Respiratory: Negative.    Cardiovascular: Negative.   Neurological: Negative.   Psychiatric/Behavioral: Negative.    All other systems reviewed and are negative.   Observations/Objective: Alert and oriented- answers all questions appropriately No distress Describes blister like lesion s on right chest wall radiating towards back  Assessment and Plan: Randy [name] in today with chief complaint of Herpes  Zoster   1. Neuralgia, post-herpetic Finish valtrex Cool compresses or ice Lidociane patches once all lesions clear up Call if no better- will need in person visit to et pain meds- patient inderstands - gabapentin (NEURONTIN) 100 MG capsule; Take 1 capsule (100 mg total) by mouth 3 (three) times daily.  Dispense: 90 capsule; Refill: 0  Urgent care records reviewed    Follow Up Instructions: prn    I discussed the assessment and treatment plan with the patient. The patient was provided an opportunity to ask questions and all were answered. The patient agreed with the plan and demonstrated an understanding of the instructions.   The patient was advised to call back or seek an in-person evaluation if the symptoms worsen or if the condition fails to improve as anticipated.  The above assessment and management plan was discussed with the patient. The patient verbalized understanding of and has agreed to the management plan. Patient is aware to call the clinic if symptoms persist or worsen. Patient is aware when to return to the clinic for a follow-up visit. Patient educated on when it is appropriate to go to the emergency department.   Time call ended:  3:11  I provided 13 minutes of  non face-to-face time during this encounter.    Mary-Margaret Hassell Done, FNP

## 2021-02-06 ENCOUNTER — Other Ambulatory Visit: Payer: Self-pay

## 2021-02-06 ENCOUNTER — Telehealth: Payer: Self-pay | Admitting: Pharmacist

## 2021-02-06 ENCOUNTER — Ambulatory Visit (INDEPENDENT_AMBULATORY_CARE_PROVIDER_SITE_OTHER): Payer: Medicare PPO | Admitting: Family Medicine

## 2021-02-06 ENCOUNTER — Encounter: Payer: Self-pay | Admitting: Family Medicine

## 2021-02-06 VITALS — BP 138/79 | HR 95 | Ht 69.0 in | Wt 181.0 lb

## 2021-02-06 DIAGNOSIS — E119 Type 2 diabetes mellitus without complications: Secondary | ICD-10-CM | POA: Diagnosis not present

## 2021-02-06 DIAGNOSIS — B0229 Other postherpetic nervous system involvement: Secondary | ICD-10-CM

## 2021-02-06 DIAGNOSIS — Z794 Long term (current) use of insulin: Secondary | ICD-10-CM | POA: Diagnosis not present

## 2021-02-06 DIAGNOSIS — I10 Essential (primary) hypertension: Secondary | ICD-10-CM

## 2021-02-06 DIAGNOSIS — E782 Mixed hyperlipidemia: Secondary | ICD-10-CM

## 2021-02-06 DIAGNOSIS — E1169 Type 2 diabetes mellitus with other specified complication: Secondary | ICD-10-CM | POA: Diagnosis not present

## 2021-02-06 LAB — BAYER DCA HB A1C WAIVED: HB A1C (BAYER DCA - WAIVED): 8.9 % — ABNORMAL HIGH (ref <7.0)

## 2021-02-06 MED ORDER — TRULICITY 4.5 MG/0.5ML ~~LOC~~ SOAJ: 4.5000 mg | SUBCUTANEOUS | 3 refills | Status: DC

## 2021-02-06 MED ORDER — LIDOCAINE 5 % EX OINT: 1.0000 "application " | TOPICAL_OINTMENT | Freq: Three times a day (TID) | CUTANEOUS | 1 refills | Status: AC | PRN

## 2021-02-06 NOTE — Progress Notes (Signed)
BP 138/79   Pulse 95   Ht '5\' 9"'  (1.753 m)   Wt 181 lb (82.1 kg)   SpO2 98%   BMI 26.73 kg/m    Subjective:   Patient ID: Randy [name], male    DOB: [birth year], 70 y.o.   MRN: [id number]  HPI: Randy [name] is a 70 y.o. male presenting on 02/06/2021 for Medical Management of Chronic Issues, Diabetes, and Hypertension   HPI Type 2 diabetes mellitus Patient comes in today for recheck of his diabetes. Patient has been currently taking Trulicity 3 mg and Basaglar 60 units and metformin and. Patient is currently on an ACE inhibitor/ARB. Patient has not seen an ophthalmologist this year. Patient denies any issues with their feet. The symptom started onset as an adult atherosclerosis and hyperlipidemia and hypertension ARE RELATED TO DM   Hypertension Patient is currently on amlodipine and hydrochlorothiazide and losartan, and their blood pressure today is 138/79. Patient denies any lightheadedness or dizziness. Patient denies headaches, blurred vision, chest pains, shortness of breath, or weakness. Denies any side effects from medication and is content with current medication.   Hyperlipidemia Patient is coming in for recheck of his hyperlipidemia. The patient is currently taking pravastatin. They deny any issues with myalgias or history of liver damage from it. They deny any focal numbness or weakness or chest pain.   Patient was diagnosed with shingles on the right side of his chest and around to his back and left side and took medicine given from an urgent care and the rash is clearing but he still having a lot of pain.  We did send in some gabapentin for him and he says it may be helping but not significantly wonders if there is something topical.  Relevant past medical, surgical, family and social history reviewed and updated as indicated. Interim medical history since our last visit reviewed. Allergies and medications reviewed and updated.  Review of Systems  Constitutional:   Negative for chills and fever.  Respiratory:  Negative for shortness of breath and wheezing.   Cardiovascular:  Negative for chest pain and leg swelling.  Musculoskeletal:  Negative for back pain and gait problem.  Skin:  Positive for rash. Negative for color change.  Neurological:  Negative for weakness and numbness.  All other systems reviewed and are negative.  Per HPI unless specifically indicated above   Allergies as of 02/06/2021   No Known Allergies      Medication List        Accurate as of February 06, 2021  8:55 AM. If you have any questions, ask your nurse or doctor.          STOP taking these medications    Trulicity 3 GS/8.1JS Sopn Generic drug: Dulaglutide Replaced by: Trulicity 4.5 RP/5.9YV Sopn Stopped by: Fransisca Kaufmann Albertina Leise, MD       TAKE these medications    amLODipine 10 MG tablet Commonly known as: NORVASC TAKE ONE (1) TABLET EACH DAY   aspirin EC 81 MG tablet Take 81 mg by mouth daily.   Basaglar KwikPen 100 UNIT/ML Inject 60 Units into the skin daily.   cilostazol 100 MG tablet Commonly known as: PLETAL Take 1 tablet (100 mg total) by mouth 2 (two) times daily.   citalopram 20 MG tablet Commonly known as: CELEXA TAKE ONE (1) TABLET BY MOUTH EVERY DAY   FreeStyle Libre 2 Reader Devi Use to check blood sugar 6 times daily. DX: E11.9   FreeStyle Libre 2  Sensor Misc Use to check blood sugar 6 times daily. Change sensor every 14 days. DX: E11.9   gabapentin 100 MG capsule Commonly known as: NEURONTIN Take 1 capsule (100 mg total) by mouth 3 (three) times daily.   hydrochlorothiazide 25 MG tablet Commonly known as: HYDRODIURIL Take 1 tablet (25 mg total) by mouth daily.   Insulin Pen Needle 31G X 8 MM Misc Commonly known as: B-D ULTRAFINE III SHORT PEN 1 each by Does not apply route as directed.   lidocaine 5 % ointment Commonly known as: XYLOCAINE Apply 1 application topically 3 (three) times daily as needed. Started by:  Worthy Rancher, MD   losartan 100 MG tablet Commonly known as: COZAAR Take 1 tablet (100 mg total) by mouth daily.   metFORMIN 500 MG 24 hr tablet Commonly known as: Glucophage XR Take 2 tablets (1,000 mg total) by mouth in the morning and at bedtime.   OneTouch Verio test strip Generic drug: glucose blood 1 each by Other route 4 (four) times daily as needed for other.   OneTouch Verio w/Device Kit 1 each by Does not apply route 4 (four) times daily as needed.   Potassium Chloride ER 20 MEQ Tbcr Take 20 mEq by mouth daily.   pravastatin 80 MG tablet Commonly known as: PRAVACHOL Take 1 tablet (80 mg total) by mouth daily.   QUEtiapine 25 MG tablet Commonly known as: SEROQUEL Take 1 tablet (25 mg total) by mouth at bedtime.   Trulicity 4.5 OK/5.9XH Sopn Generic drug: Dulaglutide Inject 4.5 mg as directed once a week. Replaces: Trulicity 3 FS/1.4EL Sopn Started by: Fransisca Kaufmann Tresea Heine, MD         Objective:   BP 138/79   Pulse 95   Ht '5\' 9"'  (1.753 m)   Wt 181 lb (82.1 kg)   SpO2 98%   BMI 26.73 kg/m   Wt Readings from Last 3 Encounters:  02/06/21 181 lb (82.1 kg)  01/25/21 186 lb (84.4 kg)  10/31/20 181 lb (82.1 kg)    Physical Exam Vitals and nursing note reviewed.  Constitutional:      General: He is not in acute distress.    Appearance: He is well-developed. He is not diaphoretic.  Eyes:     General: No scleral icterus.    Conjunctiva/sclera: Conjunctivae normal.  Neck:     Thyroid: No thyromegaly.  Cardiovascular:     Rate and Rhythm: Normal rate and regular rhythm.     Heart sounds: Normal heart sounds. No murmur heard. Pulmonary:     Effort: Pulmonary effort is normal. No respiratory distress.     Breath sounds: Normal breath sounds. No wheezing.  Musculoskeletal:        General: Normal range of motion.     Cervical back: Neck supple.  Lymphadenopathy:     Cervical: No cervical adenopathy.  Skin:    General: Skin is warm and dry.      Findings: Rash (Few scattered papules on right torso about the level of the the seventh intercostal space and a few around his back along that same dermatome, almost healed fully) present.  Neurological:     Mental Status: He is alert and oriented to person, place, and time.     Coordination: Coordination normal.  Psychiatric:        Behavior: Behavior normal.      Assessment & Plan:   Problem List Items Addressed This Visit       Cardiovascular and Mediastinum  Essential hypertension, benign     Endocrine   Type 2 diabetes mellitus with other specified complication (HCC)   Relevant Medications   Dulaglutide (TRULICITY) 4.5 XA/1.2IN SOPN     Other   Mixed hyperlipidemia   Other Visit Diagnoses     Type 2 diabetes mellitus without complication, without long-term current use of insulin (HCC)    -  Primary   Relevant Medications   Dulaglutide (TRULICITY) 4.5 OM/7.6HM SOPN   Other Relevant Orders   Bayer DCA Hb A1c Waived   Other postherpetic nervous system involvement       Relevant Medications   lidocaine (XYLOCAINE) 5 % ointment     Shingles appears to be mostly healed, still having neuralgia due to it, will send lidocaine cream, already taking gabapentin for it  A1c still elevated at 8.9, will increase Trulicity to 4.5 mg, follow-up in 1 month with Almyra Free  Follow up plan: Return in about 3 months (around 05/08/2021), or if symptoms worsen or fail to improve, for 1 month appointment with Almyra Free, follow-up with me in 3 months for diabetes.  Counseling provided for all of the vaccine components Orders Placed This Encounter  Procedures   Bayer [place] Hb A1c [place] Jshaun Abernathy, MD [facility] 02/06/2021, 8:55 AM

## 2021-02-06 NOTE — Telephone Encounter (Signed)
Libre2 cgm submitted to [facility]  Awaiting authorization

## 2021-02-27 ENCOUNTER — Other Ambulatory Visit: Payer: Self-pay

## 2021-02-27 ENCOUNTER — Ambulatory Visit (INDEPENDENT_AMBULATORY_CARE_PROVIDER_SITE_OTHER): Payer: Medicare PPO | Admitting: Pharmacist

## 2021-02-27 DIAGNOSIS — E119 Type 2 diabetes mellitus without complications: Secondary | ICD-10-CM

## 2021-02-27 NOTE — Patient Instructions (Signed)
Visit Information  PATIENT GOALS:  Goals Addressed               This Visit's Progress     Patient Stated     T2DM-PHARMD GOAL (pt-stated)        Current Barriers:  Unable to independently afford treatment regimen Unable to achieve control of T2DM   Pharmacist Clinical Goal(s):  Over the next 90 days, patient will verbalize ability to afford treatment regimen achieve control of T2DM as evidenced by IMPROVED GLYCEMIC CONTROL, GOAL A1C <7% through collaboration with PharmD and provider.   Interventions: 1:1 collaboration with Dettinger, Fransisca Kaufmann, MD regarding development and update of comprehensive plan of care as evidenced by provider attestation and co-signature Inter-disciplinary care team collaboration (see longitudinal plan of care) Comprehensive medication review performed; medication list updated in electronic medical record  Diabetes: Uncontrolled--A1C 8.9%; current treatment:BASAGLAR 60 UNITS DAILY, METFORMIN, TRULICITY 4.5MG ;  PATIENT ENROLLED IN [employer] PATIENT ASSISTANCE PROGRAM  RECENT DOSE INCREASE (TRULICITY) TO 4.5MG  SQ WEEKLY--FIRST 4.5MG  INJECTION WAS THIS WEEK Current glucose readings: fasting glucose: 145, post prandial glucose: N/A--PATIENT UNABLE TO CHECK AT HOME (METER ERRORS); SUBMITTED LIBRE 2 CGM (DEVICE & SENSORS-29MONTH SUPPLY X1 YR REFILLS) TO DME COMPANY -->[employer] VIA [place] [employer] (PER MEDICARE GUIDELINES)  REQUESTED PATIENT BRING TRADITIONAL GLUCOMETER TO ME TO TROUBLESHOOT Denies hypoglycemic/hyperglycemic symptoms (DOESN'T CHECK BLOOD SUGAR AT HOME) Discussed meal planning options and Plate method for healthy eating Avoid sugary drinks and desserts Incorporate balanced protein, non starchy veggies, 1 serving of carbohydrate with each meal Increase water intake Increase physical activity as able Current exercise: N/A Recommended continue current regimen based on FBG of 148 today; CGM will help [place] better assess; may have to  increase insulin; we are still seeing effects from increased trulicity  Assessed patient finances. Enrolled patient in the [employer] patient assistance program (trulicity and basaglar)--trulicity increased dose form was sent electronically to [employer] [facility] patient assistance pharmacy  HYPERLIPIDEMIA: --WILL ADDRESS AT NEXT VISIT AND POTENTIALLY CHANGE TO [place]  --LDL INCREASED >100  Patient Goals/Self-Care Activities Over the next 90 days, patient will:  - take medications as prescribed check glucose using CGM, document, and provide at future appointments  Follow Up Plan: Telephone follow up appointment with care management team member scheduled for: 2 WEEKS         The patient verbalized understanding of instructions, educational materials, and care plan provided today and declined offer to receive copy of patient instructions, educational materials, and care plan.   Telephone follow up appointment with care management team member scheduled for: 03/12/21  Signature Regina Eck, PharmD, BCPS Clinical Pharmacist, [place]  [facility] Phone [phone]

## 2021-02-27 NOTE — Progress Notes (Signed)
Chronic Care Management Pharmacy Note  02/27/2021 Name:  Randy [name] MRN:  [id number] DOB:  [birth year]  Summary: T2DM  Recommendations/Changes made from today's visit: Diabetes: Uncontrolled--A1C 8.9%; current treatment:BASAGLAR 60 UNITS DAILY, METFORMIN, TRULICITY 8.3MO;  PATIENT ENROLLED IN [facility] PATIENT ASSISTANCE PROGRAM  RECENT DOSE INCREASE (TRULICITY) TO 2.9UT SQ WEEKLY--FIRST 4.5MG INJECTION WAS THIS WEEK Current glucose readings: fasting glucose: 145, post prandial glucose: N/A--PATIENT UNABLE TO CHECK AT HOME (METER ERRORS); SUBMITTED LIBRE 2 CGM (DEVICE & SENSORS-57MONTH SUPPLY X1 YR REFILLS) TO DME COMPANY -->ADVANCED DIABETES SUPPLY VIA PARACHUTE PORTAL (PER MEDICARE GUIDELINES)  REQUESTED PATIENT BRING TRADITIONAL GLUCOMETER TO ME TO TROUBLESHOOT Denies hypoglycemic/hyperglycemic symptoms (DOESN'T CHECK BLOOD SUGAR AT HOME) Discussed meal planning options and Plate method for healthy eating Avoid sugary drinks and desserts Incorporate balanced protein, non starchy veggies, 1 serving of carbohydrate with each meal Increase water intake Increase physical activity as able Current exercise: N/A Recommended continue current regimen based on FBG of 148 today; CGM will help [place] better assess; may have to increase insulin; we are still seeing effects from increased trulicity  Assessed patient finances. Enrolled patient in the [facility] patient assistance program (trulicity and basaglar)--trulicity increased dose form was sent electronically to [employer] [facility] patient assistance pharmacy  HYPERLIPIDEMIA: --WILL ADDRESS AT NEXT VISIT AND POTENTIALLY CHANGE TO [place]  --LDL INCREASED >100  Patient Goals/Self-Care Activities Over the next 90 days, patient will:  - take medications as prescribed check glucose using CGM, document, and provide at future appointments  Follow Up Plan: Telephone follow up appointment with care management team member scheduled for: 2  WEEKS  Subjective: Randy [name] is an 70 y.o. year old male who is a primary patient of Dettinger, Fransisca Kaufmann, MD.  The CCM team was consulted for assistance with disease management and care coordination needs.    Engaged with patient face to face for initial visit in response to provider referral for pharmacy case management and/or care coordination services.   Consent to Services:  The patient was given the following information about Chronic Care Management services today, agreed to services, and gave verbal consent: 1. CCM service includes personalized support from designated clinical staff supervised by the primary care provider, including individualized plan of care and coordination with other care providers 2. 24/7 contact phone numbers for assistance for urgent and routine care needs. 3. Service will only be billed when office clinical staff spend 20 minutes or more in a month to coordinate care. 4. Only one practitioner may furnish and bill the service in a calendar month. 5.The patient may stop CCM services at any time (effective at the end of the month) by phone call to the office staff. 6. The patient will be responsible for cost sharing (co-pay) of up to 20% of the service fee (after annual deductible is met). Patient agreed to services and consent obtained.  Patient Care Team: Dettinger, Fransisca Kaufmann, MD as PCP - General (Family Medicine) Lavera Guise, [facility] (Pharmacist)  Objective:  Lab Results  Component Value Date   CREATININE 1.08 10/31/2020   CREATININE 1.16 04/25/2020   CREATININE 1.15 11/15/2019    Lab Results  Component Value Date   HGBA1C 8.9 (H) 02/06/2021   Last diabetic Eye exam:  Lab Results  Component Value Date/Time   HMDIABEYEEXA No Retinopathy 01/06/2015 12:00 AM    Last diabetic Foot exam: No results found for: HMDIABFOOTEX      Component Value Date/Time   CHOL 174 10/31/2020 0800  TRIG 150 (H) 10/31/2020 0800   HDL 48 10/31/2020 0800   CHOLHDL  3.6 10/31/2020 0800   LDLCALC 100 (H) 10/31/2020 0800    Hepatic Function Latest Ref Rng & Units 10/31/2020 04/25/2020 11/15/2019  Total Protein 6.0 - 8.5 g/dL 6.7 6.8 6.6  Albumin 3.8 - 4.8 g/dL 4.7 4.8 4.5  AST 0 - 40 IU/L '15 19 14  ' ALT 0 - 44 IU/L '18 19 14  ' Alk Phosphatase 44 - 121 IU/L 101 119 95  Total Bilirubin 0.0 - 1.2 mg/dL 0.5 0.5 0.3    No results found for: TSH, FREET4  CBC Latest Ref Rng & Units 10/31/2020 04/25/2020 04/27/2019  WBC 3.4 - 10.8 x10E3/uL 9.2 8.2 8.5  Hemoglobin 13.0 - 17.7 g/dL 15.6 15.3 15.9  Hematocrit 37.5 - 51.0 % 43.5 42.8 45.1  Platelets 150 - 450 x10E3/uL 208 202 190    No results found for: VD25OH  Clinical ASCVD: No  The 10-year ASCVD risk score (Arnett DK, et al., 2019) is: 34.2%   Values used to calculate the score:     Age: 70 years     Sex: Male     Is Non-Hispanic African American: No     Diabetic: Yes     Tobacco smoker: No     Systolic Blood Pressure: 830 mmHg     Is BP treated: Yes     HDL Cholesterol: 48 mg/dL     Total Cholesterol: 174 mg/dL    Other: (CHADS2VASc if Afib, PHQ9 if depression, MMRC or CAT for COPD, ACT, DEXA)  Social History   Tobacco Use  Smoking Status Former   Packs/day: 1.00   Years: 40.00   Pack years: 40.00   Types: Pipe, Cigarettes   Quit date: 02/08/2020   Years since quitting: 1.0  Smokeless Tobacco Never  Tobacco Comments   Less than 1 pk per day   BP Readings from Last 3 Encounters:  02/06/21 138/79  01/25/21 (!) 155/93  10/31/20 122/68   Pulse Readings from Last 3 Encounters:  02/06/21 95  01/25/21 66  10/31/20 77   Wt Readings from Last 3 Encounters:  02/06/21 181 lb (82.1 kg)  01/25/21 186 lb (84.4 kg)  10/31/20 181 lb (82.1 kg)    Assessment: Review of patient past medical history, allergies, medications, health status, including review of consultants reports, laboratory and other test data, was performed as part of comprehensive evaluation and provision of chronic care  management services.   SDOH:  (Social Determinants of Health) assessments and interventions performed:    CCM Care Plan  No Known Allergies  Medications Reviewed Today     Reviewed by Lavera Guise, [facility] (Pharmacist) on 02/27/21 at [place] List Status: <None>   Medication Order Taking? Sig Documenting Provider Last Dose Status Informant  amLODipine (NORVASC) 10 MG tablet [id number] No TAKE ONE (1) TABLET EACH DAY Dettinger, Fransisca Kaufmann, MD Taking Active   aspirin EC 81 MG tablet [id number] No Take 81 mg by mouth daily. [provider] Taking Active Self  Blood Glucose Monitoring Suppl (ONETOUCH VERIO) w/Device KIT [id number] No 1 each by Does not apply route 4 (four) times daily as needed. Dettinger, Fransisca Kaufmann, MD Taking Active   cilostazol (PLETAL) 100 MG tablet [id number] No Take 1 tablet (100 mg total) by mouth 2 (two) times daily. Dettinger, Fransisca Kaufmann, MD Taking Active   citalopram (CELEXA) 20 MG tablet [id number] No TAKE ONE (1) TABLET BY MOUTH EVERY DAY Dettinger, Fransisca Kaufmann, MD  Taking Active   Continuous Blood Gluc Receiver (FREESTYLE LIBRE 2 READER) DEVI [id number] No Use to check blood sugar 6 times daily. DX: E11.9 Dettinger, Fransisca Kaufmann, MD Taking Active   Continuous Blood Gluc Sensor (FREESTYLE LIBRE 2 SENSOR) MISC [id number] No Use to check blood sugar 6 times daily. Change sensor every 14 days. DX: E11.9 Dettinger, Fransisca Kaufmann, MD Taking Active            Med Note Blanca Friend, Royce Macadamia   Wed Feb 27, 2021  9:32 AM) SUBMITTED TO ADVANCED DIABETES SUPPLY VIA PARACHUTE PORTAL  Dulaglutide (TRULICITY) 4.5 ZM/6.2HU SOPN [id number]  Inject 4.5 mg as directed once a week. Dettinger, Fransisca Kaufmann, MD  Active            Med Note Blanca Friend, Royce Macadamia   Wed Feb 27, 2021  9:13 AM) VIA [employer] PATIENT ASSISTANCE PROGRAMS  gabapentin (NEURONTIN) 100 MG capsule [id number] No Take 1 capsule (100 mg total) by mouth 3 (three) times daily. Hassell Done Mary-Margaret, FNP Taking Active   hydrochlorothiazide  (HYDRODIURIL) 25 MG tablet [id number] No Take 1 tablet (25 mg total) by mouth daily. Dettinger, Fransisca Kaufmann, MD Taking Active   Insulin Glargine [facility]) 100 UNIT/ML [id number] No Inject 60 Units into the skin daily. Dettinger, Fransisca Kaufmann, MD Taking Active            Med Note Blanca Friend, Marcellus Scott Feb 27, 2021  9:33 AM) VIA [facility] PATIENT ASSISTANCE PROGRAM (SHIPS TO PATIENT'S HOME)  Insulin Pen Needle (B-D ULTRAFINE III SHORT PEN) 31G X 8 MM MISC [id number] No 1 each by Does not apply route as directed. Cassandria Anger, MD Taking Active   lidocaine (XYLOCAINE) 5 % ointment [id number]  Apply 1 application topically 3 (three) times daily as needed. Dettinger, Fransisca Kaufmann, MD  Active   losartan (COZAAR) 100 MG tablet [id number] No Take 1 tablet (100 mg total) by mouth daily. Dettinger, Fransisca Kaufmann, MD Taking Active   metFORMIN (GLUCOPHAGE XR) 500 MG 24 hr tablet [id number] No Take 2 tablets (1,000 mg total) by mouth in the morning and at bedtime. Dettinger, Fransisca Kaufmann, MD Taking Active            Med Note WILFRID, HYSER   Wed Oct 31, 2020  8:02 AM) Pt is taking 2 in the AM and none at night   [facility] VERIO test strip [id number] No 1 each by Other route 4 (four) times daily as needed for other. Dettinger, Fransisca Kaufmann, MD Taking Active   Potassium Chloride ER 20 MEQ TBCR [id number] No Take 20 mEq by mouth daily.  [provider] Taking Active Self  pravastatin (PRAVACHOL) 80 MG tablet [id number] No Take 1 tablet (80 mg total) by mouth daily. Dettinger, Fransisca Kaufmann, MD Taking Active   QUEtiapine (SEROQUEL) 25 MG tablet [id number] No Take 1 tablet (25 mg total) by mouth at bedtime. Claretta Fraise, MD Taking Active             Patient Active Problem List   Diagnosis Date Noted   GAD (generalized anxiety disorder) 09/19/2020   Atherosclerosis of native arteries of extremity with intermittent claudication ([place]) 10/21/2017   Anaphylactic syndrome 10/21/2016   Current smoker 06/04/2016    Depression, recurrent ([place]) 06/04/2016   Type 2 diabetes mellitus with other specified complication ([place]) 45/36/4680   Essential hypertension, benign 05/09/2015   Mixed hyperlipidemia 05/09/2015    Immunization History  Administered Date(s) Administered   Fluad Quad(high Dose 65+)  04/27/2019, 05/02/2020   Influenza Split 06/09/2010   Influenza, High Dose Seasonal PF 04/07/2017, 03/10/2018   Influenza, Seasonal, Injecte, Preservative Fre 03/02/2014, 04/04/2015   Influenza,inj,Quad PF,6+ Mos 03/02/2014   Moderna Sars-Covid-2 Vaccination 08/18/2019, 09/17/2019   Pneumococcal Polysaccharide-23 06/09/2005   Tdap 10/26/1998    Conditions to be addressed/monitored: DMII  Care Plan : PHARMD MEDICATION MANAGEMENT  Updates made by Lavera Guise, [place] since 02/27/2021 12:00 AM     Problem: DISEASE PROGRESSION PREVENTION      Long-Range Goal: T2DM   This Visit's Progress: Not on track  Priority: High  Note:   Current Barriers:  Unable to independently afford treatment regimen Unable to achieve control of T2DM   Pharmacist Clinical Goal(s):  Over the next 90 days, patient will verbalize ability to afford treatment regimen achieve control of T2DM as evidenced by IMPROVED GLYCEMIC CONTROL, GOAL A1C <7%  through collaboration with PharmD and provider.   Interventions: 1:1 collaboration with Dettinger, Fransisca Kaufmann, MD regarding development and update of comprehensive plan of care as evidenced by provider attestation and co-signature Inter-disciplinary care team collaboration (see longitudinal plan of care) Comprehensive medication review performed; medication list updated in electronic medical record  Diabetes: Uncontrolled--A1C 8.9%; current treatment:BASAGLAR 60 UNITS DAILY, METFORMIN, TRULICITY 4.1DE;  PATIENT ENROLLED IN [employer] [employer] PATIENT ASSISTANCE PROGRAM  RECENT DOSE INCREASE (TRULICITY) TO 0.8XK SQ WEEKLY--FIRST 4.5MG INJECTION WAS THIS WEEK Current glucose readings: fasting  glucose: 145, post prandial glucose: N/A--PATIENT UNABLE TO CHECK AT HOME (METER ERRORS); SUBMITTED LIBRE 2 CGM (DEVICE & SENSORS-14MONTH SUPPLY X1 YR REFILLS) TO DME COMPANY -->ADVANCED DIABETES SUPPLY VIA PARACHUTE PORTAL (PER MEDICARE GUIDELINES)  REQUESTED PATIENT BRING TRADITIONAL GLUCOMETER TO ME TO TROUBLESHOOT Denies hypoglycemic/hyperglycemic symptoms (DOESN'T CHECK BLOOD SUGAR AT HOME) Discussed meal planning options and Plate method for healthy eating Avoid sugary drinks and desserts Incorporate balanced protein, non starchy veggies, 1 serving of carbohydrate with each meal Increase water intake Increase physical activity as able Current exercise: N/A Recommended continue current regimen based on FBG of 148 today; CGM will help [place] better assess; may have to increase insulin; we are still seeing effects from increased trulicity  Assessed patient finances. Enrolled patient in the [employer] patient assistance program (trulicity and basaglar)--trulicity increased dose form was sent electronically to [employer] [facility] patient assistance pharmacy  HYPERLIPIDEMIA: --WILL ADDRESS AT NEXT VISIT AND POTENTIALLY CHANGE TO [place]  --LDL INCREASED >100  Patient Goals/Self-Care Activities Over the next 90 days, patient will:  - take medications as prescribed check glucose using CGM, document, and provide at future appointments  Follow Up Plan: Telephone follow up appointment with care management team member scheduled for: 2 WEEKS      Medication Assistance:  BASAGLAR/TRULICITY obtained through [place] medication assistance program.  Enrollment ends 06/08/21  Patient's preferred pharmacy is:  [facility], [place] [address] [place] [address] Phone: [phone] Fax: [facility] [address] [place] - [address] [place] [address] Phone: [phone] Fax: [phone]  [facility]  [facility] [place], [place] - [address] [place] [address] Phone: [phone] Fax: [phone]  Follow Up:  Patient agrees to Care Plan and Follow-up.  Plan: Face to Face appointment with care management team member scheduled for: 03/12/21  Regina Eck, PharmD, BCPS Clinical Pharmacist, [place]  II Phone [phone]

## 2021-02-28 DIAGNOSIS — M79676 Pain in unspecified toe(s): Secondary | ICD-10-CM | POA: Diagnosis not present

## 2021-02-28 DIAGNOSIS — E1151 Type 2 diabetes mellitus with diabetic peripheral angiopathy without gangrene: Secondary | ICD-10-CM | POA: Diagnosis not present

## 2021-02-28 DIAGNOSIS — L84 Corns and callosities: Secondary | ICD-10-CM | POA: Diagnosis not present

## 2021-02-28 DIAGNOSIS — B351 Tinea unguium: Secondary | ICD-10-CM | POA: Diagnosis not present

## 2021-03-01 ENCOUNTER — Telehealth: Payer: Self-pay | Admitting: Pharmacist

## 2021-03-01 MED ORDER — ACCU-CHEK GUIDE VI STRP: ORAL_STRIP | 12 refills | Status: DC

## 2021-03-01 NOTE — Telephone Encounter (Signed)
Accu check guide me meter --error code when patient is testing Test strips were bad New test strips given Called in new rx

## 2021-03-08 DIAGNOSIS — E119 Type 2 diabetes mellitus without complications: Secondary | ICD-10-CM

## 2021-03-08 DIAGNOSIS — E782 Mixed hyperlipidemia: Secondary | ICD-10-CM | POA: Diagnosis not present

## 2021-03-12 ENCOUNTER — Ambulatory Visit: Payer: Medicare PPO

## 2021-03-13 ENCOUNTER — Other Ambulatory Visit: Payer: Self-pay | Admitting: Family Medicine

## 2021-04-09 ENCOUNTER — Ambulatory Visit: Payer: Medicare PPO

## 2021-04-17 ENCOUNTER — Other Ambulatory Visit: Payer: Self-pay | Admitting: Family Medicine

## 2021-04-25 ENCOUNTER — Other Ambulatory Visit: Payer: Self-pay | Admitting: Family Medicine

## 2021-04-25 DIAGNOSIS — F411 Generalized anxiety disorder: Secondary | ICD-10-CM

## 2021-04-25 DIAGNOSIS — F339 Major depressive disorder, recurrent, unspecified: Secondary | ICD-10-CM

## 2021-05-06 ENCOUNTER — Other Ambulatory Visit: Payer: Self-pay | Admitting: Family Medicine

## 2021-05-06 DIAGNOSIS — I10 Essential (primary) hypertension: Secondary | ICD-10-CM

## 2021-05-09 DIAGNOSIS — L84 Corns and callosities: Secondary | ICD-10-CM | POA: Diagnosis not present

## 2021-05-09 DIAGNOSIS — M79676 Pain in unspecified toe(s): Secondary | ICD-10-CM | POA: Diagnosis not present

## 2021-05-09 DIAGNOSIS — B351 Tinea unguium: Secondary | ICD-10-CM | POA: Diagnosis not present

## 2021-05-09 DIAGNOSIS — E1151 Type 2 diabetes mellitus with diabetic peripheral angiopathy without gangrene: Secondary | ICD-10-CM | POA: Diagnosis not present

## 2021-05-15 ENCOUNTER — Ambulatory Visit (INDEPENDENT_AMBULATORY_CARE_PROVIDER_SITE_OTHER): Payer: Medicare Other | Admitting: Family Medicine

## 2021-05-15 ENCOUNTER — Encounter: Payer: Self-pay | Admitting: Family Medicine

## 2021-05-15 VITALS — BP 153/80 | HR 87 | Ht 69.0 in | Wt 172.0 lb

## 2021-05-15 DIAGNOSIS — F339 Major depressive disorder, recurrent, unspecified: Secondary | ICD-10-CM | POA: Diagnosis not present

## 2021-05-15 DIAGNOSIS — E119 Type 2 diabetes mellitus without complications: Secondary | ICD-10-CM | POA: Diagnosis not present

## 2021-05-15 DIAGNOSIS — E782 Mixed hyperlipidemia: Secondary | ICD-10-CM | POA: Diagnosis not present

## 2021-05-15 DIAGNOSIS — Z794 Long term (current) use of insulin: Secondary | ICD-10-CM | POA: Diagnosis not present

## 2021-05-15 DIAGNOSIS — E1169 Type 2 diabetes mellitus with other specified complication: Secondary | ICD-10-CM | POA: Diagnosis not present

## 2021-05-15 DIAGNOSIS — I10 Essential (primary) hypertension: Secondary | ICD-10-CM

## 2021-05-15 DIAGNOSIS — F411 Generalized anxiety disorder: Secondary | ICD-10-CM

## 2021-05-15 DIAGNOSIS — Z23 Encounter for immunization: Secondary | ICD-10-CM

## 2021-05-15 LAB — BAYER DCA HB A1C WAIVED: HB A1C (BAYER DCA - WAIVED): 8.6 % — ABNORMAL HIGH (ref 4.8–5.6)

## 2021-05-15 MED ORDER — CILOSTAZOL 100 MG PO TABS
100.0000 mg | ORAL_TABLET | Freq: Two times a day (BID) | ORAL | 3 refills | Status: DC
Start: 2021-05-15 — End: 2022-04-02

## 2021-05-15 MED ORDER — HYDROCHLOROTHIAZIDE 25 MG PO TABS
25.0000 mg | ORAL_TABLET | Freq: Every day | ORAL | 3 refills | Status: DC
Start: 2021-05-15 — End: 2022-04-02

## 2021-05-15 MED ORDER — METFORMIN HCL ER 500 MG PO TB24: 1000.0000 mg | ORAL_TABLET | Freq: Two times a day (BID) | ORAL | 3 refills | Status: DC

## 2021-05-15 MED ORDER — FREESTYLE SYSTEM KIT: 1.0000 | PACK | Freq: Two times a day (BID) | 1 refills | Status: AC

## 2021-05-15 MED ORDER — FREESTYLE LIBRE 2 SENSOR MISC: 11 refills | Status: DC

## 2021-05-15 MED ORDER — BASAGLAR KWIKPEN 100 UNIT/ML ~~LOC~~ SOPN: 60.0000 [IU] | PEN_INJECTOR | Freq: Every day | SUBCUTANEOUS | 11 refills | Status: DC

## 2021-05-15 MED ORDER — CITALOPRAM HYDROBROMIDE 20 MG PO TABS: 20.0000 mg | ORAL_TABLET | Freq: Every day | ORAL | 3 refills | Status: DC

## 2021-05-15 MED ORDER — LOSARTAN POTASSIUM 100 MG PO TABS
100.0000 mg | ORAL_TABLET | Freq: Every day | ORAL | 3 refills | Status: DC
Start: 2021-05-15 — End: 2022-04-02

## 2021-05-15 MED ORDER — AMLODIPINE BESYLATE 10 MG PO TABS: 10.0000 mg | ORAL_TABLET | Freq: Every day | ORAL | 3 refills | Status: DC

## 2021-05-15 MED ORDER — FREESTYLE SYSTEM KIT: 1.0000 | PACK | Freq: Two times a day (BID) | 1 refills | Status: DC

## 2021-05-15 NOTE — Progress Notes (Signed)
BP (!) 153/80   Pulse 87   Ht _0  (1.753 m)   Wt 172 lb (78 kg)   SpO2 97%   BMI 25.40 kg/m    Subjective:   Patient ID: Randy [name], male    DOB: 11/03/1950, 70 y.o.   MRN: [id number]  HPI: Randy [name] is a 70 y.o. male presenting on 05/15/2021 for Medical Management of Chronic Issues, Diabetes, and Hyperlipidemia   HPI Type 2 diabetes mellitus Patient comes in today for recheck of his diabetes. Patient has been currently taking Basaglar 60 units and Trulicity 4.5 weekly and metformin. Patient is currently on an ACE inhibitor/ARB. Patient has seen an ophthalmologist this year. Patient denies any issues with their feet. The symptom started onset as an adult hypertension and hyperlipidemia and atherosclerosis ARE RELATED TO DM   Hypertension Patient is currently on amlodipine and hydrochlorothiazide and losartan, and their blood pressure today is 153/80, he does admit he did not take his medicines this morning. Patient denies any lightheadedness or dizziness. Patient denies headaches, blurred vision, chest pains, shortness of breath, or weakness. Denies any side effects from medication and is content with current medication.   Hyperlipidemia Patient is coming in for recheck of his hyperlipidemia. The patient is currently taking pravastatin. They deny any issues with myalgias or history of liver damage from it. They deny any focal numbness or weakness or chest pain.   Depression anxiety recheck Patient is coming in today for depression and anxiety recheck.  He says he is taking escitalopram and it is working well for him.  He denies any major issues with that and would like to continue forward with it.  Relevant past medical, surgical, family and social history reviewed and updated as indicated. Interim medical history since our last visit reviewed. Allergies and medications reviewed and updated.  Review of Systems  Constitutional:  Negative for chills and fever.   Respiratory:  Negative for shortness of breath and wheezing.   Cardiovascular:  Negative for chest pain and leg swelling.  Musculoskeletal:  Negative for back pain and gait problem.  Skin:  Negative for rash.  Neurological:  Negative for dizziness, weakness and light-headedness.  All other systems reviewed and are negative.  Per HPI unless specifically indicated above   Allergies as of 05/15/2021   No Known Allergies      Medication List        Accurate as of May 15, 2021  8:54 AM. If you have any questions, ask your nurse or doctor.          STOP taking these medications    gabapentin 100 MG capsule Commonly known as: NEURONTIN Stopped by: Worthy Rancher, MD   QUEtiapine 25 MG tablet Commonly known as: SEROQUEL Stopped by: Fransisca Kaufmann Shiheem Corporan, MD       TAKE these medications    Accu-Chek Guide test strip Generic drug: glucose blood Use as instructed to test blood sugar daily. DX: E11.65 (patient using guide meter now)   amLODipine 10 MG tablet Commonly known as: NORVASC Take 1 tablet (10 mg total) by mouth daily. What changed: See the new instructions. Changed by: Fransisca Kaufmann Laporshia Hogen, MD   aspirin EC 81 MG tablet Take 81 mg by mouth daily.   Basaglar KwikPen 100 UNIT/ML Inject 60 Units into the skin daily.   cilostazol 100 MG tablet Commonly known as: PLETAL Take 1 tablet (100 mg total) by mouth 2 (two) times daily.   citalopram 20 MG  tablet Commonly known as: CELEXA Take 1 tablet (20 mg total) by mouth daily. What changed: how much to take Changed by: Worthy Rancher, MD   FreeStyle Libre 2 Sensor Misc Use to check blood sugar 4 times daily. Change sensor every 14 days. DX: E11.9 What changed: additional instructions Changed by: Fransisca Kaufmann Kacey Vicuna, MD   glucose monitoring kit monitoring kit 1 each by Does not apply route 2 (two) times daily before a meal. Started by: Fransisca Kaufmann Janila Arrazola, MD   hydrochlorothiazide 25 MG  tablet Commonly known as: HYDRODIURIL Take 1 tablet (25 mg total) by mouth daily. What changed: See the new instructions. Changed by: Fransisca Kaufmann Kaitlinn Iversen, MD   Insulin Pen Needle 31G X 8 MM Misc Commonly known as: B-D ULTRAFINE III SHORT PEN 1 each by Does not apply route as directed.   lidocaine 5 % ointment Commonly known as: XYLOCAINE Apply 1 application topically 3 (three) times daily as needed.   losartan 100 MG tablet Commonly known as: COZAAR Take 1 tablet (100 mg total) by mouth daily. What changed: See the new instructions. Changed by: Worthy Rancher, MD   metFORMIN 500 MG 24 hr tablet Commonly known as: Glucophage XR Take 2 tablets (1,000 mg total) by mouth in the morning and at bedtime.   Potassium Chloride ER 20 MEQ Tbcr Take 20 mEq by mouth daily.   pravastatin 80 MG tablet Commonly known as: PRAVACHOL Take 1 tablet (80 mg total) by mouth daily.   Trulicity 4.5 VH/8.4ON Sopn Generic drug: Dulaglutide Inject 4.5 mg as directed once a week.         Objective:   BP (!) 153/80   Pulse 87   Ht _0  (1.753 m)   Wt 172 lb (78 kg)   SpO2 97%   BMI 25.40 kg/m   Wt Readings from Last 3 Encounters:  05/15/21 172 lb (78 kg)  02/06/21 181 lb (82.1 kg)  01/25/21 186 lb (84.4 kg)    Physical Exam Vitals and nursing note reviewed.  Constitutional:      General: He is not in acute distress.    Appearance: He is well-developed. He is not diaphoretic.  Eyes:     General: No scleral icterus.    Conjunctiva/sclera: Conjunctivae normal.  Neck:     Thyroid: No thyromegaly.  Cardiovascular:     Rate and Rhythm: Normal rate and regular rhythm.     Heart sounds: Normal heart sounds. No murmur heard. Pulmonary:     Effort: Pulmonary effort is normal. No respiratory distress.     Breath sounds: Normal breath sounds. No wheezing.  Musculoskeletal:        General: No swelling. Normal range of motion.     Cervical back: Neck supple.  Lymphadenopathy:      Cervical: No cervical adenopathy.  Skin:    General: Skin is warm and dry.     Findings: No rash.  Neurological:     Mental Status: He is alert and oriented to person, place, and time.     Coordination: Coordination normal.  Psychiatric:        Behavior: Behavior normal.      Assessment & Plan:   Problem List Items Addressed This Visit       Cardiovascular and Mediastinum   Essential hypertension, benign   Relevant Medications   amLODipine (NORVASC) 10 MG tablet   hydrochlorothiazide (HYDRODIURIL) 25 MG tablet   losartan (COZAAR) 100 MG tablet   Other Relevant Orders  CBC with Differential/Platelet   CMP14+EGFR   Lipid panel   Bayer DCA Hb A1c Waived     Endocrine   Type 2 diabetes mellitus with other specified complication (HCC)   Relevant Medications   Insulin Glargine (BASAGLAR KWIKPEN) 100 UNIT/ML   losartan (COZAAR) 100 MG tablet   metFORMIN (GLUCOPHAGE XR) 500 MG 24 hr tablet     Other   Depression, recurrent (HCC)   Relevant Medications   citalopram (CELEXA) 20 MG tablet   GAD (generalized anxiety disorder)   Relevant Medications   citalopram (CELEXA) 20 MG tablet   Mixed hyperlipidemia   Relevant Medications   amLODipine (NORVASC) 10 MG tablet   hydrochlorothiazide (HYDRODIURIL) 25 MG tablet   losartan (COZAAR) 100 MG tablet   Other Relevant Orders   CBC with Differential/Platelet   CMP14+EGFR   Lipid panel   Bayer DCA Hb A1c Waived   Other Visit Diagnoses     Type 2 diabetes mellitus without complication, without long-term current use of insulin (HCC)    -  Primary   Relevant Medications   Insulin Glargine (BASAGLAR KWIKPEN) 100 UNIT/ML   losartan (COZAAR) 100 MG tablet   metFORMIN (GLUCOPHAGE XR) 500 MG 24 hr tablet   Other Relevant Orders   CBC with Differential/Platelet   CMP14+EGFR   Lipid panel   Bayer DCA Hb A1c Waived       A1c is 8.6 which is improved but not significantly better.  He is not really using a glucometer to check his  sugars.  This office visit was a visit to discuss patient's diabetic management and because she is out of control and using insulin 1 times daily and having to check his blood sugars 4 times daily I believe she would be a good candidate for a continuous subcutaneous glucose monitor such as freestyle libre.  Because blood sugars are out of control Follow up plan: Return in about 3 months (around 08/13/2021), or if symptoms worsen or fail to improve, for Diabetes recheck.  Counseling provided for all of the vaccine components Orders Placed This Encounter  Procedures   CBC with Differential/Platelet   CMP14+EGFR   Lipid panel   Bayer DCA Hb A1c Waived    Caryl Pina, MD [facility] 05/15/2021, 8:54 AM

## 2021-05-16 LAB — CMP14+EGFR
ALT: 15 IU/L (ref 0–44)
AST: 14 IU/L (ref 0–40)
Albumin/Globulin Ratio: 2.3 — ABNORMAL HIGH (ref 1.2–2.2)
Albumin: 4.5 g/dL (ref 3.8–4.8)
Alkaline Phosphatase: 101 IU/L (ref 44–121)
BUN/Creatinine Ratio: 15 (ref 10–24)
BUN: 15 mg/dL (ref 8–27)
Bilirubin Total: 0.4 mg/dL (ref 0.0–1.2)
CO2: 28 mmol/L (ref 20–29)
Calcium: 9.6 mg/dL (ref 8.6–10.2)
Chloride: 97 mmol/L (ref 96–106)
Creatinine, Ser: 1.03 mg/dL (ref 0.76–1.27)
Globulin, Total: 2 g/dL (ref 1.5–4.5)
Glucose: 116 mg/dL — ABNORMAL HIGH (ref 70–99)
Potassium: 4.3 mmol/L (ref 3.5–5.2)
Sodium: 137 mmol/L (ref 134–144)
Total Protein: 6.5 g/dL (ref 6.0–8.5)
eGFR: 79 mL/min/{1.73_m2} (ref >59)

## 2021-05-16 LAB — CBC WITH DIFFERENTIAL/PLATELET
Basophils Absolute: 0.1 10*3/uL (ref 0.0–0.2)
Basos: 1 %
EOS (ABSOLUTE): 0.3 10*3/uL (ref 0.0–0.4)
Eos: 3 %
Hematocrit: 44.5 % (ref 37.5–51.0)
Hemoglobin: 15.4 g/dL (ref 13.0–17.7)
Immature Grans (Abs): 0.1 10*3/uL (ref 0.0–0.1)
Immature Granulocytes: 1 %
Lymphocytes Absolute: 1.4 10*3/uL (ref 0.7–3.1)
Lymphs: 15 %
MCH: 33 pg (ref 26.6–33.0)
MCHC: 34.6 g/dL (ref 31.5–35.7)
MCV: 96 fL (ref 79–97)
Monocytes Absolute: 0.7 10*3/uL (ref 0.1–0.9)
Monocytes: 8 %
Neutrophils Absolute: 6.8 10*3/uL (ref 1.4–7.0)
Neutrophils: 72 %
Platelets: 219 10*3/uL (ref 150–450)
RBC: 4.66 x10E6/uL (ref 4.14–5.80)
RDW: 12.4 % (ref 11.6–15.4)
WBC: 9.3 10*3/uL (ref 3.4–10.8)

## 2021-05-16 LAB — LIPID PANEL
Chol/HDL Ratio: 2.8 ratio (ref 0.0–5.0)
Cholesterol, Total: 145 mg/dL (ref 100–199)
HDL: 51 mg/dL (ref >39)
LDL Chol Calc (NIH): 78 mg/dL (ref 0–99)
Triglycerides: 81 mg/dL (ref 0–149)
VLDL Cholesterol Cal: 16 mg/dL (ref 5–40)

## 2021-05-21 ENCOUNTER — Ambulatory Visit (INDEPENDENT_AMBULATORY_CARE_PROVIDER_SITE_OTHER): Payer: Medicare Other | Admitting: Pharmacist

## 2021-05-21 DIAGNOSIS — I10 Essential (primary) hypertension: Secondary | ICD-10-CM

## 2021-05-21 DIAGNOSIS — Z794 Long term (current) use of insulin: Secondary | ICD-10-CM

## 2021-05-29 NOTE — Progress Notes (Signed)
Chronic Care Management Pharmacy Note  05/21/2021 Name:  Randy [name] MRN:  [id number] DOB:  1950-09-22  Summary: t2dm  Recommendations/Changes made from today's visit:  Diabetes: Uncontrolled--A1C 8.6% (SLIGHT IMPROVEMENT); current treatment:BASAGLAR 60 UNITS DAILY, METFORMIN, TRULICITY 3.2WQ;  PATIENT ENROLLED IN [employer] [employer] PATIENT ASSISTANCE PROGRAM  RECENT DOSE INCREASE (TRULICITY) TO 3.7DK SQ WEEKLY Current glucose readings: fasting glucose: 145, post prandial glucose: N/A--PATIENT UNABLE TO CHECK AT HOME (METER ERRORS);  SUBMITTED LIBRE 2 CGM (DEVICE & SENSORS-76MONTH SUPPLY X1 YR REFILLS) TO DME COMPANY -->ADVANCED DIABETES SUPPLY VIA [place]UTE PORTAL (PER MEDICARE GUIDELINES) --PATIENT'S INSURANCE REQUIRES 20% COPAY REQUESTED PATIENT BRING TRADITIONAL GLUCOMETER TO ME TO TROUBLESHOOT Denies hypoglycemic/hyperglycemic symptoms (DOESN'T CHECK BLOOD SUGAR AT HOME) Discussed meal planning options and Plate method for healthy eating Avoid sugary drinks and desserts Incorporate balanced protein, non starchy veggies, 1 serving of carbohydrate with each meal Increase water intake Increase physical activity as able Current exercise: N/A Recommended may have to increase insulin if trulicity higher dose does not provide coverage Assessed patient finances. Re-enrollment submitted to the lilly cares patient assistance program (trulicity and basaglar) for 2023  HYPERLIPIDEMIA: --WILL ADDRESS AT NEXT VISIT AND POTENTIALLY CHANGE TO [place]  --LDL Improved to 78 - improve diet-->heart healthy  Patient Goals/Self-Care Activities Over the next 90 days, patient will:  - take medications as prescribed check glucose using CGM, document, and provide at future appointments  Follow Up Plan: Telephone follow up appointment with care management team member scheduled for: 1 month   Subjective: Randy [name] is an 70 y.o. year old male who is a primary patient of Dettinger,  Fransisca Kaufmann, MD.  The CCM team was consulted for assistance with disease management and care coordination needs.    Engaged with patient by telephone for follow up visit in response to provider referral for pharmacy case management and/or care coordination services.   Consent to Services:  The patient was given information about Chronic Care Management services, agreed to services, and gave verbal consent prior to initiation of services.  Please see initial visit note for detailed documentation.   Patient Care Team: Dettinger, Fransisca Kaufmann, MD as PCP - General (Family Medicine) Lavera Guise, [facility] (Pharmacist)  Objective:  Lab Results  Component Value Date   CREATININE 1.03 05/15/2021   CREATININE 1.08 10/31/2020   CREATININE 1.16 04/25/2020    Lab Results  Component Value Date   HGBA1C 8.6 (H) 05/15/2021   Last diabetic Eye exam:  Lab Results  Component Value Date/Time   HMDIABEYEEXA No Retinopathy 01/06/2015 12:00 AM    Last diabetic Foot exam: No results found for: HMDIABFOOTEX      Component Value Date/Time   CHOL 145 05/15/2021 0825   TRIG 81 05/15/2021 0825   HDL 51 05/15/2021 0825   CHOLHDL 2.8 05/15/2021 0825   LDLCALC 78 05/15/2021 0825    Hepatic Function Latest Ref Rng & Units 05/15/2021 10/31/2020 04/25/2020  Total Protein 6.0 - 8.5 g/dL 6.5 6.7 6.8  Albumin 3.8 - 4.8 g/dL 4.5 4.7 4.8  AST 0 - 40 IU/L '14 15 19  ' ALT 0 - 44 IU/L '15 18 19  ' Alk Phosphatase 44 - 121 IU/L 101 101 119  Total Bilirubin 0.0 - 1.2 mg/dL 0.4 0.5 0.5    No results found for: TSH, FREET4  CBC Latest Ref Rng & Units 05/15/2021 10/31/2020 04/25/2020  WBC 3.4 - 10.8 x10E3/uL 9.3 9.2 8.2  Hemoglobin 13.0 - 17.7 g/dL 15.4 15.6 15.3  Hematocrit  37.5 - 51.0 % 44.5 43.5 42.8  Platelets 150 - 450 x10E3/uL 219 208 202    No results found for: VD25OH  Clinical ASCVD: No  The 10-year ASCVD risk score (Arnett DK, et al., 2019) is: 37.9%   Values used to calculate the score:     Age: 70 years      Sex: Male     Is Non-Hispanic African American: No     Diabetic: Yes     Tobacco smoker: No     Systolic Blood Pressure: 569 mmHg     Is BP treated: Yes     HDL Cholesterol: 51 mg/dL     Total Cholesterol: 145 mg/dL    Other: (CHADS2VASc if Afib, PHQ9 if depression, MMRC or CAT for COPD, ACT, DEXA)  Social History   Tobacco Use  Smoking Status Former   Packs/day: 1.00   Years: 40.00   Pack years: 40.00   Types: Pipe, Cigarettes   Quit date: 02/08/2020   Years since quitting: 1.3  Smokeless Tobacco Never  Tobacco Comments   Less than 1 pk per day   BP Readings from Last 3 Encounters:  05/15/21 (!) 153/80  02/06/21 138/79  01/25/21 (!) 155/93   Pulse Readings from Last 3 Encounters:  05/15/21 87  02/06/21 95  01/25/21 66   Wt Readings from Last 3 Encounters:  05/15/21 172 lb (78 kg)  02/06/21 181 lb (82.1 kg)  01/25/21 186 lb (84.4 kg)    Assessment: Review of patient past medical history, allergies, medications, health status, including review of consultants reports, laboratory and other test data, was performed as part of comprehensive evaluation and provision of chronic care management services.   SDOH:  (Social Determinants of Health) assessments and interventions performed:    CCM Care Plan  No Known Allergies  Medications Reviewed Today     Reviewed by Lavera Guise, [facility] (Pharmacist) on 05/29/21 at [phone]  Med List Status: <None>   Medication Order Taking? Sig Documenting Provider Last Dose Status Informant  amLODipine (NORVASC) 10 MG tablet [id number]  Take 1 tablet (10 mg total) by mouth daily. Dettinger, Fransisca Kaufmann, MD  Active   aspirin EC 81 MG tablet [id number] No Take 81 mg by mouth daily. [provider] Taking Active Self  cilostazol (PLETAL) 100 MG tablet [id number]  Take 1 tablet (100 mg total) by mouth 2 (two) times daily. Dettinger, Fransisca Kaufmann, MD  Active   citalopram (CELEXA) 20 MG tablet [id number]  Take 1 tablet (20 mg total) by mouth  daily. Dettinger, Fransisca Kaufmann, MD  Active   Continuous Blood Gluc Sensor (FREESTYLE LIBRE 2 SENSOR) [place] [id number]  Use to check blood sugar 4 times daily. Change sensor every 14 days. DX: E11.9 Dettinger, Fransisca Kaufmann, MD  Active   Dulaglutide (TRULICITY) 4.5 AX/0.9MM SOPN [id number]  Inject 4.5 mg as directed once a week. Dettinger, Fransisca Kaufmann, MD  Active            Med Note Blanca Friend, Danee Soller D   Wed Feb 27, 2021  9:13 AM) VIA [employer] PATIENT ASSISTANCE PROGRAMS  glucose blood (ACCU-CHEK GUIDE) test strip [id number]  Use as instructed to test blood sugar daily. DX: E11.65 (patient using guide meter now) Dettinger, Fransisca Kaufmann, MD  Active   glucose monitoring kit (FREESTYLE) monitoring kit [id number]  1 each by Does not apply route 2 (two) times daily before a meal. Dettinger, Fransisca Kaufmann, MD  Active   hydrochlorothiazide (HYDRODIURIL) 25 MG tablet [id number]  Take 1 tablet (25 mg total) by mouth daily. Dettinger, Fransisca Kaufmann, MD  Active   Insulin Glargine [facility] KWIKPEN) 100 UNIT/ML [id number]  Inject 60 Units into the skin daily. Dettinger, Fransisca Kaufmann, MD  Active   Insulin Pen Needle (B-D ULTRAFINE III SHORT PEN) 31G X 8 MM MISC [id number] No 1 each by Does not apply route as directed. Cassandria Anger, MD Taking Active   lidocaine (XYLOCAINE) 5 % ointment [id number]  Apply 1 application topically 3 (three) times daily as needed. Dettinger, Fransisca Kaufmann, MD  Active   losartan (COZAAR) 100 MG tablet [id number]  Take 1 tablet (100 mg total) by mouth daily. Dettinger, Fransisca Kaufmann, MD  Active   metFORMIN (GLUCOPHAGE XR) 500 MG 24 hr tablet [id number]  Take 2 tablets (1,000 mg total) by mouth in the morning and at bedtime. Dettinger, Fransisca Kaufmann, MD  Active   Potassium Chloride ER 20 MEQ TBCR [id number] No Take 20 mEq by mouth daily.  [provider] Taking Active Self  pravastatin (PRAVACHOL) 80 MG tablet [id number] No Take 1 tablet (80 mg total) by mouth daily. Dettinger, Fransisca Kaufmann, MD Taking Active              Patient Active Problem List   Diagnosis Date Noted   GAD (generalized anxiety disorder) 09/19/2020   Atherosclerosis of native arteries of extremity with intermittent claudication ([place]) 10/21/2017   Anaphylactic syndrome 10/21/2016   Current smoker 06/04/2016   Depression, recurrent ([place]) 06/04/2016   Type 2 diabetes mellitus with other specified complication ([place]) 19/37/9024   Essential hypertension, benign 05/09/2015   Mixed hyperlipidemia 05/09/2015    Immunization History  Administered Date(s) Administered   Fluad Quad(high Dose 65+) 04/27/2019, 05/02/2020, 05/15/2021   Influenza Split 06/09/2010   Influenza, High Dose Seasonal PF 04/07/2017, 03/10/2018   Influenza, Seasonal, Injecte, Preservative Fre 03/02/2014, 04/04/2015   Influenza,inj,Quad PF,6+ Mos 03/02/2014   Moderna Sars-Covid-2 Vaccination 08/18/2019, 09/17/2019   Pneumococcal Polysaccharide-23 06/09/2005   Tdap 10/26/1998    Conditions to be addressed/monitored: HLD and DMII  Care Plan : PHARMD MEDICATION MANAGEMENT  Updates made by Lavera Guise, [place] since 05/29/2021 12:00 AM     Problem: DISEASE PROGRESSION PREVENTION      Long-Range Goal: T2DM   Recent Progress: Not on track  Priority: High  Note:   Current Barriers:  Unable to independently afford treatment regimen Unable to achieve control of T2DM   Pharmacist Clinical Goal(s):  Over the next 90 days, patient will verbalize ability to afford treatment regimen achieve control of T2DM as evidenced by IMPROVED GLYCEMIC CONTROL, GOAL A1C <7%  through collaboration with PharmD and provider.   Interventions: 1:1 collaboration with Dettinger, Fransisca Kaufmann, MD regarding development and update of comprehensive plan of care as evidenced by provider attestation and co-signature Inter-disciplinary care team collaboration (see longitudinal plan of care) Comprehensive medication review performed; medication list updated in electronic medical  record  Diabetes: Uncontrolled--A1C 8.6% (SLIGHT IMPROVEMENT); current treatment:BASAGLAR 60 UNITS DAILY, METFORMIN, TRULICITY 0.9BD;  PATIENT ENROLLED IN [employer] PATIENT ASSISTANCE PROGRAM  RECENT DOSE INCREASE (TRULICITY) TO 5.3GD SQ WEEKLY Current glucose readings: fasting glucose: 145, post prandial glucose: N/A--PATIENT UNABLE TO CHECK AT HOME (METER ERRORS);  SUBMITTED LIBRE 2 CGM (DEVICE & SENSORS-60MONTH SUPPLY X1 YR REFILLS) TO DME COMPANY -->ADVANCED DIABETES SUPPLY VIA [place] PORTAL (PER MEDICARE GUIDELINES) --PATIENT'S INSURANCE REQUIRES 20% COPAY REQUESTED PATIENT BRING TRADITIONAL GLUCOMETER TO ME TO TROUBLESHOOT Denies hypoglycemic/hyperglycemic symptoms (DOESN'T CHECK BLOOD SUGAR AT HOME) Discussed meal planning  options and Plate method for healthy eating Avoid sugary drinks and desserts Incorporate balanced protein, non starchy veggies, 1 serving of carbohydrate with each meal Increase water intake Increase physical activity as able Current exercise: N/A Recommended may have to increase insulin if trulicity higher dose does not provide coverage Assessed patient finances. Re-enrollment submitted to the lilly cares patient assistance program (trulicity and basaglar) for 2023  HYPERLIPIDEMIA: --WILL ADDRESS AT NEXT VISIT AND POTENTIALLY CHANGE TO [place]  --LDL INCREASED >100  Patient Goals/Self-Care Activities Over the next 90 days, patient will:  - take medications as prescribed check glucose using CGM, document, and provide at future appointments  Follow Up Plan: Telephone follow up appointment with care management team member scheduled for: 1 month      Medication Assistance: Application for lilly cares  medication assistance program. in process.  Anticipated assistance start date tbd.  See plan of care for additional detail.  Patient's preferred pharmacy is:  [facility] - [address], [place]  [address] Phone: [phone] Fax: [place] [address], [place] - [address] [place] [address] Phone: [phone] Fax: [phone]  [facility] [place], [place] - [address] [place] [address] Phone: [phone] Fax: [phone]  Follow Up:  Patient agrees to Care Plan and Follow-up.  Plan: Telephone follow up appointment with care management team member scheduled for:  2 months   Regina Eck, PharmD, BCPS Clinical Pharmacist, [place]  II Phone [phone]

## 2021-05-29 NOTE — Patient Instructions (Addendum)
Visit Information  Thank you for taking time to visit with me today. Please don't hesitate to contact me if I can be of assistance to you before our next scheduled telephone appointment.  Following are the goals we discussed today:   Diabetes: Uncontrolled--A1C 8.6% (SLIGHT IMPROVEMENT); current treatment:BASAGLAR 60 UNITS DAILY, METFORMIN, TRULICITY 4.5MG ;  PATIENT ENROLLED IN [employer] PATIENT ASSISTANCE PROGRAM  RECENT DOSE INCREASE (TRULICITY) TO 4.5MG  SQ WEEKLY Current glucose readings: fasting glucose: 145, post prandial glucose: N/A--PATIENT UNABLE TO CHECK AT HOME (METER ERRORS);  SUBMITTED LIBRE 2 CGM (DEVICE & SENSORS-76MONTH SUPPLY X1 YR REFILLS) TO DME COMPANY -->ADVANCED DIABETES SUPPLY VIA PAR[place]UTE PORTAL (PER MEDICARE GUIDELINES) --PATIENT'S INSURANCE REQUIRES 20% COPAY REQUESTED PATIENT BRING TRADITIONAL GLUCOMETER TO ME TO TROUBLESHOOT Denies hypoglycemic/hyperglycemic symptoms (DOESN'T CHECK BLOOD SUGAR AT HOME) Discussed meal planning options and Plate method for healthy eating Avoid sugary drinks and desserts Incorporate balanced protein, non starchy veggies, 1 serving of carbohydrate with each meal Increase water intake Increase physical activity as able Current exercise: N/A Recommended may have to increase insulin if trulicity higher dose does not provide coverage Assessed patient finances. Re-enrollment submitted to the lilly cares patient assistance program (trulicity and basaglar) for 2023  HYPERLIPIDEMIA: --WILL ADDRESS AT NEXT VISIT AND POTENTIALLY CHANGE TO [place]  --LDL Improved to 78 - improve diet-->heart healthy  Patient Goals/Self-Care Activities Over the next 90 days, patient will:  - take medications as prescribed check glucose using CGM, document, and provide at future appointments  Follow Up Plan: Telephone follow up appointment with care management team member scheduled for: 1 month    Please call the care guide team at [phone]  if you need to cancel or reschedule your appointment.   The patient verbalized understanding of instructions, educational materials, and care plan provided today and declined offer to receive copy of patient instructions, educational materials, and care plan.   Signature Regina Eck, PharmD, BCPS Clinical Pharmacist, [place]  II Phone [phone]

## 2021-06-05 ENCOUNTER — Other Ambulatory Visit: Payer: Self-pay | Admitting: Family Medicine

## 2021-06-08 DIAGNOSIS — I10 Essential (primary) hypertension: Secondary | ICD-10-CM | POA: Diagnosis not present

## 2021-06-08 DIAGNOSIS — Z794 Long term (current) use of insulin: Secondary | ICD-10-CM | POA: Diagnosis not present

## 2021-06-08 DIAGNOSIS — E1169 Type 2 diabetes mellitus with other specified complication: Secondary | ICD-10-CM

## 2021-06-26 NOTE — Progress Notes (Signed)
Received notification from [place] regarding approval for [place]. Patient assistance approved from 06/22/21/ to 06/08/22.  Phone: [phone]

## 2021-07-17 ENCOUNTER — Ambulatory Visit (INDEPENDENT_AMBULATORY_CARE_PROVIDER_SITE_OTHER): Payer: Medicare PPO | Admitting: Pharmacist

## 2021-07-17 DIAGNOSIS — E1169 Type 2 diabetes mellitus with other specified complication: Secondary | ICD-10-CM

## 2021-07-17 MED ORDER — TRULICITY 4.5 MG/0.5ML ~~LOC~~ SOAJ: 4.5000 mg | SUBCUTANEOUS | 3 refills | Status: DC

## 2021-07-17 MED ORDER — BASAGLAR KWIKPEN 100 UNIT/ML ~~LOC~~ SOPN: 60.0000 [IU] | PEN_INJECTOR | Freq: Every day | SUBCUTANEOUS | 11 refills | Status: DC

## 2021-07-17 NOTE — Patient Instructions (Addendum)
Visit Information  Following are the goals we discussed today:   Diabetes: Uncontrolled--A1C 8.6% (SLIGHT IMPROVEMENT); current treatment:BASAGLAR 60 UNITS DAILY, METFORMIN, TRULICITY 4.5MG ;  PATIENT ENROLLED IN [employer] PATIENT ASSISTANCE PROGRAM  Received notification from [employer] regarding approval for [place]. Patient assistance approved from 06/22/21 to 06/08/22.  Phone: [phone] [place] these to [place] (TRULICITY) TO 4.5MG  SQ WEEKLY--this will hopefull reflect in A1c recheck  Plan: Telephone follow up appointment with care management team member scheduled for:  2 months  Signature Regina Eck, PharmD, BCPS Clinical Pharmacist, [place]  II Phone [phone]   Please call the care guide team at [phone] if you need to cancel or reschedule your appointment.   The patient verbalized understanding of instructions, educational materials, and care plan provided today and agreed to receive a mailed copy of patient instructions, educational materials, and care plan.

## 2021-07-17 NOTE — Progress Notes (Signed)
Chronic Care Management Pharmacy Note  07/17/2021 Name:  [name] MRN:  [id number] DOB:  [birth year]  Summary: T2DM  Recommendations/Changes made from today's visit: Diabetes: Uncontrolled--A1C 8.6% (SLIGHT IMPROVEMENT); current treatment:BASAGLAR 60 UNITS DAILY, METFORMIN, TRULICITY 9.6GE;  Unable to tolerate farxiga due to frequent urination--may retrial if not controlled PATIENT ENROLLED IN [facility] PATIENT ASSISTANCE PROGRAM  Received notification from [place] regarding approval for [place]. Patient assistance approved from 06/22/21 to 06/08/22.  Phone: [phone] Escribe these to [place] (TRULICITY) TO 5.0PT SQ WEEKLY--this will hopefull reflect in A1c recheck May have to add meal time insulin if patient still uncontrolled  Subjective: Randy [name] is an 71 y.o. year old male who is a primary patient of Dettinger, Fransisca Kaufmann, MD.  The CCM team was consulted for assistance with disease management and care coordination needs.    Engaged with patient by telephone for follow up visit in response to provider referral for pharmacy case management and/or care coordination services.   Consent to Services:  The patient was given information about Chronic Care Management services, agreed to services, and gave verbal consent prior to initiation of services.  Please see initial visit note for detailed documentation.   Patient Care Team: Dettinger, Fransisca Kaufmann, MD as PCP - General (Family Medicine) Lavera Guise, [facility] (Pharmacist)  Objective:  Lab Results  Component Value Date   CREATININE 1.03 05/15/2021   CREATININE 1.08 10/31/2020   CREATININE 1.16 04/25/2020    Lab Results  Component Value Date   HGBA1C 8.6 (H) 05/15/2021   Last diabetic Eye exam:  Lab Results  Component Value Date/Time   HMDIABEYEEXA No Retinopathy 01/06/2015 12:00 AM    Last diabetic Foot exam: No results found for:  HMDIABFOOTEX      Component Value Date/Time   CHOL 145 05/15/2021 0825   TRIG 81 05/15/2021 0825   HDL 51 05/15/2021 0825   CHOLHDL 2.8 05/15/2021 0825   LDLCALC 78 05/15/2021 0825    Hepatic Function Latest Ref Rng & Units 05/15/2021 10/31/2020 04/25/2020  Total Protein 6.0 - 8.5 g/dL 6.5 6.7 6.8  Albumin 3.8 - 4.8 g/dL 4.5 4.7 4.8  AST 0 - 40 IU/L _0 ALT 0 - 44 IU/L _1 Alk Phosphatase 44 - 121 IU/L 101 101 119  Total Bilirubin 0.0 - 1.2 mg/dL 0.4 0.5 0.5    No results found for: TSH, FREET4  CBC Latest Ref Rng & Units 05/15/2021 10/31/2020 04/25/2020  WBC 3.4 - 10.8 x10E3/uL 9.3 9.2 8.2  Hemoglobin 13.0 - 17.7 g/dL 15.4 15.6 15.3  Hematocrit 37.5 - 51.0 % 44.5 43.5 42.8  Platelets 150 - 450 x10E3/uL 219 208 202    No results found for: VD25OH  Clinical ASCVD: Yes  The 10-year ASCVD risk score (Arnett DK, et al., 2019) is: 37.9%   Values used to calculate the score:     Age: 46 years     Sex: Male     Is Non-Hispanic African American: No     Diabetic: Yes     Tobacco smoker: No     Systolic Blood Pressure: 465 mmHg     Is BP treated: Yes     HDL Cholesterol: 51 mg/dL     Total Cholesterol: 145 mg/dL    Other: (CHADS2VASc if Afib, PHQ9 if depression, MMRC or CAT for COPD, ACT, DEXA)  Social History   Tobacco Use  Smoking Status  Former   Packs/day: 1.00   Years: 40.00   Pack years: 40.00   Types: Pipe, Cigarettes   Quit date: 02/08/2020   Years since quitting: 1.4  Smokeless Tobacco Never  Tobacco Comments   Less than 1 pk per day   BP Readings from Last 3 Encounters:  05/15/21 (!) 153/80  02/06/21 138/79  01/25/21 (!) 155/93   Pulse Readings from Last 3 Encounters:  05/15/21 87  02/06/21 95  01/25/21 66   Wt Readings from Last 3 Encounters:  05/15/21 172 lb (78 kg)  02/06/21 181 lb (82.1 kg)  01/25/21 186 lb (84.4 kg)    Assessment: Review of patient past medical history, allergies, medications, health status, including review of  consultants reports, laboratory and other test data, was performed as part of comprehensive evaluation and provision of chronic care management services.   SDOH:  (Social Determinants of Health) assessments and interventions performed:    CCM Care Plan  No Known Allergies  Medications Reviewed Today     Reviewed by Lavera Guise, [facility] (Pharmacist) on 07/17/21 at 1039  Med List Status: <None>   Medication Order Taking? Sig Documenting Provider Last Dose Status Informant  amLODipine (NORVASC) 10 MG tablet [id number]  Take 1 tablet (10 mg total) by mouth daily. Dettinger, Fransisca Kaufmann, MD  Active   aspirin EC 81 MG tablet [id number] No Take 81 mg by mouth daily. [provider] Taking Active Self  cilostazol (PLETAL) 100 MG tablet [id number]  Take 1 tablet (100 mg total) by mouth 2 (two) times daily. Dettinger, Fransisca Kaufmann, MD  Active   citalopram (CELEXA) 20 MG tablet [id number]  Take 1 tablet (20 mg total) by mouth daily. Dettinger, Fransisca Kaufmann, MD  Active   Continuous Blood Gluc Sensor (FREESTYLE LIBRE 2 SENSOR) [place] [id number]  Use to check blood sugar 4 times daily. Change sensor every 14 days. DX: E11.9 Dettinger, Fransisca Kaufmann, MD  Active   Dulaglutide (TRULICITY) 4.5 YD/7.4JO SOPN [id number]  Inject 4.5 mg as directed once a week. Dettinger, Fransisca Kaufmann, MD  Active   glucose blood (ACCU-CHEK GUIDE) test strip [id number]  Use as instructed to test blood sugar daily. DX: E11.65 (patient using guide meter now) Dettinger, Fransisca Kaufmann, MD  Active   glucose monitoring kit (FREESTYLE) monitoring kit [id number]  1 each by Does not apply route 2 (two) times daily before a meal. Dettinger, Fransisca Kaufmann, MD  Active   hydrochlorothiazide (HYDRODIURIL) 25 MG tablet [id number]  Take 1 tablet (25 mg total) by mouth daily. Dettinger, Fransisca Kaufmann, MD  Active   Insulin Glargine [facility] KWIKPEN) 100 UNIT/ML [id number]  Inject 60 Units into the skin daily. Dettinger, Fransisca Kaufmann, MD  Active   Insulin Pen Needle (B-D ULTRAFINE III SHORT  PEN) 31G X 8 MM MISC [id number] No 1 each by Does not apply route as directed. Cassandria Anger, MD Taking Active   lidocaine (XYLOCAINE) 5 % ointment [id number]  Apply 1 application topically 3 (three) times daily as needed. Dettinger, Fransisca Kaufmann, MD  Active   losartan (COZAAR) 100 MG tablet [id number]  Take 1 tablet (100 mg total) by mouth daily. Dettinger, Fransisca Kaufmann, MD  Active   metFORMIN (GLUCOPHAGE XR) 500 MG 24 hr tablet [id number]  Take 2 tablets (1,000 mg total) by mouth in the morning and at bedtime. Dettinger, Fransisca Kaufmann, MD  Active   Potassium Chloride ER 20 MEQ TBCR [id number] No Take 20 mEq by mouth daily.  [provider] Taking Active Self  pravastatin (PRAVACHOL) 80 MG tablet [id number]  TAKE ONE (1) TABLET BY MOUTH EVERY DAY Dettinger, Fransisca Kaufmann, MD  Active             Patient Active Problem List   Diagnosis Date Noted   GAD (generalized anxiety disorder) 09/19/2020   Atherosclerosis of native arteries of extremity with intermittent claudication ([place]) 10/21/2017   Anaphylactic syndrome 10/21/2016   Current smoker 06/04/2016   Depression, recurrent ([place]) 06/04/2016   Type 2 diabetes mellitus with other specified complication ([place]) 72/53/6644   Essential hypertension, benign 05/09/2015   Mixed hyperlipidemia 05/09/2015    Immunization History  Administered Date(s) Administered   Fluad Quad(high Dose 65+) 04/27/2019, 05/02/2020, 05/15/2021   Influenza Split 06/09/2010   Influenza, High Dose Seasonal PF 04/07/2017, 03/10/2018   Influenza, Seasonal, Injecte, Preservative Fre 03/02/2014, 04/04/2015   Influenza,inj,Quad PF,6+ Mos 03/02/2014   Moderna Sars-Covid-2 Vaccination 08/18/2019, 09/17/2019   Pneumococcal Polysaccharide-23 06/09/2005   Tdap 10/26/1998    Conditions to be addressed/monitored: HLD and DMII  Care Plan : PHARMD MEDICATION MANAGEMENT  Updates made by Lavera Guise, [place] since 07/17/2021 12:00 AM     Problem: DISEASE PROGRESSION PREVENTION       Long-Range Goal: T2DM   Recent Progress: Not on track  Priority: High  Note:   Current Barriers:  Unable to independently afford treatment regimen Unable to achieve control of T2DM   Pharmacist Clinical Goal(s):  Over the next 90 days, patient will verbalize ability to afford treatment regimen achieve control of T2DM as evidenced by IMPROVED GLYCEMIC CONTROL, GOAL A1C <7%  through collaboration with PharmD and provider.   Interventions: 1:1 collaboration with Dettinger, Fransisca Kaufmann, MD regarding development and update of comprehensive plan of care as evidenced by provider attestation and co-signature Inter-disciplinary care team collaboration (see longitudinal plan of care) Comprehensive medication review performed; medication list updated in electronic medical record  Diabetes: Uncontrolled--A1C 8.6% (SLIGHT IMPROVEMENT); current treatment:BASAGLAR 60 UNITS DAILY, METFORMIN, TRULICITY 0.3KV;  Unable to tolerate farxiga due to frequent urination--may retrial if not controlled PATIENT ENROLLED IN [facility] PATIENT ASSISTANCE PROGRAM  Received notification from [place] regarding approval for [place]. Patient assistance approved from 06/22/21 to 06/08/22.  Phone: [phone] Escribe these to [place] (TRULICITY) TO 4.3PI SQ WEEKLY--this will hopefull reflect in A1c recheck Current glucose readings: fasting glucose: 145, post prandial glucose: N/A--PATIENT UNABLE TO CHECK AT HOME (METER ERRORS);  SUBMITTED LIBRE 2 CGM (DEVICE & SENSORS-1MONTH SUPPLY X1 YR REFILLS) TO DME COMPANY -->ADVANCED DIABETES SUPPLY VIA [place] (PER MEDICARE GUIDELINES) --PATIENT'S INSURANCE REQUIRES 20% COPAY REQUESTED PATIENT BRING TRADITIONAL GLUCOMETER TO ME TO TROUBLESHOOT Denies hypoglycemic/hyperglycemic symptoms (DOESN'T CHECK BLOOD SUGAR AT HOME) Discussed meal planning options and Plate method for healthy eating Avoid sugary  drinks and desserts Incorporate balanced protein, non starchy veggies, 1 serving of carbohydrate with each meal Increase water intake Increase physical activity as able Current exercise: N/A Recommended may have to increase insulin if trulicity higher dose does not provide coverage Assessed patient finances. Re-enrollment submitted to the lilly cares patient assistance program (trulicity and basaglar) for 2023  HYPERLIPIDEMIA: --WILL ADDRESS AT NEXT VISIT AND POTENTIALLY CHANGE TO [place]  --LDL Improved to 78  Patient Goals/Self-Care Activities Over the next 90 days, patient will:  - take medications as prescribed check glucose using CGM, document, and provide at future appointments  Follow Up Plan: Telephone follow up appointment with care management team member scheduled for: 1 month  Medication Assistance:  basaglar/trulicity obtained through [place] [facility]  medication assistance program.  Enrollment ends 06/08/22  Patient's preferred pharmacy is:  [facility], [address] [place] [address] Phone: [phone] Fax: [phone]  [facility], [address] [address] [place] [address] Phone: [phone] Fax: [phone]  [facility], [address] [place] [address] Phone: [phone] Fax: [phone]    Follow Up:  Patient agrees to Care Plan and Follow-up.  Plan: Telephone follow up appointment with care management team member scheduled for:  09/2021   Regina Eck, PharmD, BCPS Clinical Pharmacist, [place]  [facility] Phone [phone]

## 2021-07-18 DIAGNOSIS — B351 Tinea unguium: Secondary | ICD-10-CM | POA: Diagnosis not present

## 2021-07-18 DIAGNOSIS — E1142 Type 2 diabetes mellitus with diabetic polyneuropathy: Secondary | ICD-10-CM | POA: Diagnosis not present

## 2021-07-18 DIAGNOSIS — L84 Corns and callosities: Secondary | ICD-10-CM | POA: Diagnosis not present

## 2021-07-18 DIAGNOSIS — M79676 Pain in unspecified toe(s): Secondary | ICD-10-CM | POA: Diagnosis not present

## 2021-07-26 DIAGNOSIS — E113293 Type 2 diabetes mellitus with mild nonproliferative diabetic retinopathy without macular edema, bilateral: Secondary | ICD-10-CM | POA: Diagnosis not present

## 2021-07-26 DIAGNOSIS — D3132 Benign neoplasm of left choroid: Secondary | ICD-10-CM | POA: Diagnosis not present

## 2021-07-26 DIAGNOSIS — H2513 Age-related nuclear cataract, bilateral: Secondary | ICD-10-CM | POA: Diagnosis not present

## 2021-07-26 DIAGNOSIS — H43823 Vitreomacular adhesion, bilateral: Secondary | ICD-10-CM | POA: Diagnosis not present

## 2021-08-12 ENCOUNTER — Other Ambulatory Visit: Payer: Self-pay | Admitting: Family Medicine

## 2021-08-12 ENCOUNTER — Other Ambulatory Visit: Payer: Self-pay

## 2021-08-12 NOTE — Telephone Encounter (Signed)
Last office visit 05/15/21 ?Upcoming appointment 08/14/21 ?On med list but has not been prescribed at our office ?

## 2021-08-12 NOTE — Telephone Encounter (Signed)
Looks like according to the charts patient was switched from sertraline to citalopram in December 2022 ?

## 2021-08-14 ENCOUNTER — Encounter: Payer: Self-pay | Admitting: Family Medicine

## 2021-08-14 ENCOUNTER — Ambulatory Visit (INDEPENDENT_AMBULATORY_CARE_PROVIDER_SITE_OTHER): Payer: No Typology Code available for payment source | Admitting: Family Medicine

## 2021-08-14 VITALS — BP 143/68 | HR 74 | Ht 69.0 in | Wt 178.0 lb

## 2021-08-14 DIAGNOSIS — Z1211 Encounter for screening for malignant neoplasm of colon: Secondary | ICD-10-CM

## 2021-08-14 DIAGNOSIS — F339 Major depressive disorder, recurrent, unspecified: Secondary | ICD-10-CM | POA: Diagnosis not present

## 2021-08-14 DIAGNOSIS — Z794 Long term (current) use of insulin: Secondary | ICD-10-CM

## 2021-08-14 DIAGNOSIS — E1169 Type 2 diabetes mellitus with other specified complication: Secondary | ICD-10-CM | POA: Diagnosis not present

## 2021-08-14 DIAGNOSIS — I10 Essential (primary) hypertension: Secondary | ICD-10-CM | POA: Diagnosis not present

## 2021-08-14 DIAGNOSIS — E782 Mixed hyperlipidemia: Secondary | ICD-10-CM | POA: Diagnosis not present

## 2021-08-14 DIAGNOSIS — F411 Generalized anxiety disorder: Secondary | ICD-10-CM

## 2021-08-14 LAB — BAYER DCA HB A1C WAIVED: HB A1C (BAYER DCA - WAIVED): 8.2 % — ABNORMAL HIGH (ref 4.8–5.6)

## 2021-08-14 MED ORDER — PRAVASTATIN SODIUM 80 MG PO TABS: 80.0000 mg | ORAL_TABLET | Freq: Every day | ORAL | 3 refills | Status: DC

## 2021-08-14 MED ORDER — NOVOLOG FLEXPEN 100 UNIT/ML ~~LOC~~ SOPN: 5.0000 [IU] | PEN_INJECTOR | Freq: Three times a day (TID) | SUBCUTANEOUS | 11 refills | Status: DC

## 2021-08-14 NOTE — Progress Notes (Signed)
? ?BP (!) 143/68   Pulse 74   Ht _0  (1.753 m)   Wt 178 lb (80.7 kg)   SpO2 99%   BMI 26.29 kg/m?   ? ?Subjective:  ? ?Patient ID: Randy [name], male    DOB: [birth year], 71 y.o.   MRN: [id number] ? ?HPI: ?Randy [name] is a 71 y.o. male presenting on 08/14/2021 for Medical Management of Chronic Issues, Diabetes, Hypertension, Hyperlipidemia, and Anxiety ? ? ?HPI ?Type 2 diabetes mellitus ?Patient comes in today for recheck of his diabetes. Patient has been currently taking Basaglar 60 units daily and Trulicity 4.5 and metformin 500 twice a day, unable to tolerate more than that.. Patient is currently on an ACE inhibitor/ARB. Patient has not seen an ophthalmologist this year. Patient has only 1 complaint with his feet that he has a callus on the floor pad of his left foot.  He does see podiatry for this and they do shave it every now and then.. The symptom started onset as an adult atherosclerosis and hyperlipidemia and hypertension ARE RELATED TO DM.  Patient had previously tried [place] and it made him urinate too often so he does not want to do 1 of those medicines ? ?Hypertension ?Patient is currently on losartan and hydrochlorothiazide and amlodipine, and their blood pressure today is 143/68. Patient denies any lightheadedness or dizziness. Patient denies headaches, blurred vision, chest pains, shortness of breath, or weakness. Denies any side effects from medication and is content with current medication.  ? ?Hyperlipidemia ?Patient is coming in for recheck of his hyperlipidemia. The patient is currently taking pravastatin. They deny any issues with myalgias or history of liver damage from it. They deny any focal numbness or weakness or chest pain.  ? ?Anxiety depression recheck ?Patient is currently taking citalopram, had stopped the sertraline just recently.  He said there was complete fusion and he was taking both for a short period of time ?Depression screen [place] Day 2/9 08/14/2021 05/15/2021 02/06/2021  10/31/2020 09/19/2020  ?Decreased Interest 0 0 _1 ?Down, Depressed, Hopeless 0 0 1 0 0  ?PHQ - 2 Score 0 0 _2 ?Altered sleeping 0 - 0 0 1  ?Tired, decreased energy 0 - 0 0 1  ?Change in appetite 0 - 0 0 1  ?Feeling bad or failure about yourself  0 - 0 0 3  ?Trouble concentrating 0 - 0 0 3  ?Moving slowly or fidgety/restless 0 - 0 0 1  ?Suicidal thoughts 0 - 0 - 0  ?PHQ-9 Score - - _3 ?Difficult doing work/chores - - - Not difficult at all -  ?Some recent data might be hidden  ?  ? ?Relevant past medical, surgical, family and social history reviewed and updated as indicated. Interim medical history since our last visit reviewed. ?Allergies and medications reviewed and updated. ? ?Review of Systems  ?Constitutional:  Negative for chills and fever.  ?Eyes:  Negative for visual disturbance.  ?Respiratory:  Negative for shortness of breath and wheezing.   ?Cardiovascular:  Negative for chest pain and leg swelling.  ?Skin:  Negative for rash.  ?Psychiatric/Behavioral:  Negative for dysphoric mood, self-injury, sleep disturbance and suicidal ideas. The patient is not nervous/anxious.   ?All other systems reviewed and are negative. ? ?Per HPI unless specifically indicated above ? ? ?Allergies as of 08/14/2021   ?No Known Allergies ?  ? ?  ?Medication List  ?  ? ?  ? Accurate as  of August 14, 2021  9:03 AM. If you have any questions, ask your nurse or doctor.  ?  ?  ? ?  ? ?STOP taking these medications   ? ?sertraline 100 MG tablet ?Commonly known as: ZOLOFT ?Stopped by: Worthy Rancher, MD ?  ? ?  ? ?TAKE these medications   ? ?Accu-Chek Guide test strip ?Generic drug: glucose blood ?Use as instructed to test blood sugar daily. DX: E11.65 (patient using guide meter now) ?  ?amLODipine 10 MG tablet ?Commonly known as: NORVASC ?Take 1 tablet (10 mg total) by mouth daily. ?  ?aspirin EC 81 MG tablet ?Take 81 mg by mouth daily. ?  ?Basaglar KwikPen 100 UNIT/ML ?Inject 60 Units into the skin daily. ?  ?cilostazol 100  MG tablet ?Commonly known as: PLETAL ?Take 1 tablet (100 mg total) by mouth 2 (two) times daily. ?  ?citalopram 20 MG tablet ?Commonly known as: CELEXA ?Take 1 tablet (20 mg total) by mouth daily. ?  ?FreeStyle Libre 2 Sensor Misc ?Use to check blood sugar 4 times daily. Change sensor every 14 days. DX: E11.9 ?  ?glucose monitoring kit monitoring kit ?1 each by Does not apply route 2 (two) times daily before a meal. ?  ?hydrochlorothiazide 25 MG tablet ?Commonly known as: HYDRODIURIL ?Take 1 tablet (25 mg total) by mouth daily. ?  ?Insulin Pen Needle 31G X 8 MM Misc ?Commonly known as: B-D ULTRAFINE III SHORT PEN ?1 each by Does not apply route as directed. ?  ?lidocaine 5 % ointment ?Commonly known as: XYLOCAINE ?Apply 1 application topically 3 (three) times daily as needed. ?  ?losartan 100 MG tablet ?Commonly known as: COZAAR ?Take 1 tablet (100 mg total) by mouth daily. ?  ?metFORMIN 500 MG 24 hr tablet ?Commonly known as: Glucophage XR ?Take 2 tablets (1,000 mg total) by mouth in the morning and at bedtime. ?  ?NovoLOG FlexPen 100 UNIT/ML FlexPen ?Generic drug: insulin aspart ?Inject 5-10 Units into the skin 3 (three) times daily with meals. ?Started by: Worthy Rancher, MD ?  ?Potassium Chloride ER 20 MEQ Tbcr ?Take 20 mEq by mouth daily. ?  ?pravastatin 80 MG tablet ?Commonly known as: PRAVACHOL ?Take 1 tablet (80 mg total) by mouth daily. ?What changed: how much to take ?Changed by: Worthy Rancher, MD ?  ?Trulicity 4.5 WS/5.6CL Sopn ?Generic drug: Dulaglutide ?Inject 4.5 mg as directed once a week. ?  ? ?  ? ? ? ?Objective:  ? ?BP (!) 143/68   Pulse 74   Ht _0  (1.753 m)   Wt 178 lb (80.7 kg)   SpO2 99%   BMI 26.29 kg/m?   ?Wt Readings from Last 3 Encounters:  ?08/14/21 178 lb (80.7 kg)  ?05/15/21 172 lb (78 kg)  ?02/06/21 181 lb (82.1 kg)  ?  ?Physical Exam ?Vitals and nursing note reviewed.  ?Constitutional:   ?   General: He is not in acute distress. ?   Appearance: He is well-developed. He  is not diaphoretic.  ?Eyes:  ?   General: No scleral icterus. ?   Conjunctiva/sclera: Conjunctivae normal.  ?Neck:  ?   Thyroid: No thyromegaly.  ?Cardiovascular:  ?   Rate and Rhythm: Normal rate and regular rhythm.  ?   Heart sounds: Normal heart sounds. No murmur heard. ?Pulmonary:  ?   Effort: Pulmonary effort is normal. No respiratory distress.  ?   Breath sounds: Normal breath sounds. No wheezing.  ?Musculoskeletal:     ?  General: No swelling. Normal range of motion.  ?   Cervical back: Neck supple.  ?Lymphadenopathy:  ?   Cervical: No cervical adenopathy.  ?Skin: ?   General: Skin is warm and dry.  ?   Findings: No rash.  ?Neurological:  ?   Mental Status: He is alert and oriented to person, place, and time.  ?   Coordination: Coordination normal.  ?Psychiatric:     ?   Behavior: Behavior normal.  ? ? ? ? ?Assessment & Plan:  ? ?Problem List Items Addressed This Visit   ? ?  ? Cardiovascular and Mediastinum  ? Essential hypertension, benign  ? Relevant Medications  ? pravastatin (PRAVACHOL) 80 MG tablet  ?  ? Endocrine  ? Type 2 diabetes mellitus with other specified complication ([place]) - Primary  ? Relevant Medications  ? pravastatin (PRAVACHOL) 80 MG tablet  ? insulin aspart (NOVOLOG FLEXPEN) 100 UNIT/ML FlexPen  ? Other Relevant Orders  ? Bayer DCA Hb A1c Waived  ?  ? Other  ? Mixed hyperlipidemia  ? Relevant Medications  ? pravastatin (PRAVACHOL) 80 MG tablet  ? Depression, recurrent ([place])  ? GAD (generalized anxiety disorder)  ? ?Other Visit Diagnoses   ? ? Colon cancer screening      ? Relevant Orders  ? Cologuard  ? ?  ?  ?Continue current medicine, and recommended continuing to see podiatry for his foot. ? ?A1c is improved at 8.1.  We will add Humalog.  Start at 5 units 3 times daily with meals ? ? ?Permissive hypertension.  Within acceptable range today. ?Follow up plan: ?Return in about 3 months (around 11/14/2021), or if symptoms worsen or fail to improve, for Diabetes and anxiety. ? ?Counseling  provided for all of the vaccine components ?Orders Placed This Encounter  ?Procedures  ? Bayer DCA Hb A1c Waived  ? Cologuard  ? ? ?Caryl Pina, MD ?[place] ?08/14/2021, 9:03 AM ? ? ?

## 2021-08-28 DIAGNOSIS — Z1211 Encounter for screening for malignant neoplasm of colon: Secondary | ICD-10-CM | POA: Diagnosis not present

## 2021-09-04 LAB — COLOGUARD: COLOGUARD: NEGATIVE

## 2021-09-12 ENCOUNTER — Telehealth: Payer: Medicare PPO

## 2021-09-26 DIAGNOSIS — E1142 Type 2 diabetes mellitus with diabetic polyneuropathy: Secondary | ICD-10-CM | POA: Diagnosis not present

## 2021-09-26 DIAGNOSIS — M79676 Pain in unspecified toe(s): Secondary | ICD-10-CM | POA: Diagnosis not present

## 2021-09-26 DIAGNOSIS — L84 Corns and callosities: Secondary | ICD-10-CM | POA: Diagnosis not present

## 2021-09-26 DIAGNOSIS — B351 Tinea unguium: Secondary | ICD-10-CM | POA: Diagnosis not present

## 2021-09-27 ENCOUNTER — Telehealth: Payer: Medicare PPO

## 2021-10-04 ENCOUNTER — Telehealth: Payer: Medicare PPO

## 2021-10-10 ENCOUNTER — Telehealth: Payer: Medicare PPO

## 2021-11-12 ENCOUNTER — Telehealth: Payer: Medicare PPO

## 2021-11-27 ENCOUNTER — Encounter: Payer: Self-pay | Admitting: Family Medicine

## 2021-11-27 ENCOUNTER — Ambulatory Visit (INDEPENDENT_AMBULATORY_CARE_PROVIDER_SITE_OTHER): Payer: No Typology Code available for payment source | Admitting: Family Medicine

## 2021-11-27 VITALS — BP 129/65 | HR 82 | Temp 98.2°F | Ht 69.0 in | Wt 181.0 lb

## 2021-11-27 DIAGNOSIS — E1169 Type 2 diabetes mellitus with other specified complication: Secondary | ICD-10-CM | POA: Diagnosis not present

## 2021-11-27 DIAGNOSIS — F411 Generalized anxiety disorder: Secondary | ICD-10-CM | POA: Diagnosis not present

## 2021-11-27 DIAGNOSIS — Z794 Long term (current) use of insulin: Secondary | ICD-10-CM

## 2021-11-27 DIAGNOSIS — I10 Essential (primary) hypertension: Secondary | ICD-10-CM

## 2021-11-27 DIAGNOSIS — E782 Mixed hyperlipidemia: Secondary | ICD-10-CM | POA: Diagnosis not present

## 2021-11-27 DIAGNOSIS — F339 Major depressive disorder, recurrent, unspecified: Secondary | ICD-10-CM

## 2021-11-27 LAB — BAYER DCA HB A1C WAIVED: HB A1C (BAYER DCA - WAIVED): 10.3 % — ABNORMAL HIGH (ref 4.8–5.6)

## 2021-11-27 MED ORDER — FREESTYLE LIBRE 2 SENSOR MISC: 11 refills | Status: DC

## 2021-11-27 MED ORDER — NOVOLOG FLEXPEN 100 UNIT/ML ~~LOC~~ SOPN: 10.0000 [IU] | PEN_INJECTOR | Freq: Three times a day (TID) | SUBCUTANEOUS | 11 refills | Status: DC

## 2021-11-27 MED ORDER — BASAGLAR KWIKPEN 100 UNIT/ML ~~LOC~~ SOPN: 70.0000 [IU] | PEN_INJECTOR | Freq: Every day | SUBCUTANEOUS | 4 refills | Status: DC

## 2021-11-27 NOTE — Progress Notes (Signed)
BP 129/65   Pulse 82   Temp 98.2 F (36.8 C)   Ht 5' 9" (1.753 m)   Wt 181 lb (82.1 kg)   SpO2 98%   BMI 26.73 kg/m    Subjective:   Patient ID: Randy [name], male    DOB: 03/28/1951, 71 y.o.   MRN: [id number]  HPI: Randy [name] is a 71 y.o. male presenting on 11/27/2021 for Medical Management of Chronic Issues, Diabetes, and Depression   HPI Type 2 diabetes mellitus Patient comes in today for recheck of his diabetes. Patient has been currently taking metformin and Basaglar and NovoLog although he admits he is not taking the NovoLog and is lowered the metformin to just 1 pill a day because of his stomach issues and Trulicity 4.5, he says his blood sugar this morning was 207. Patient is currently on an ACE inhibitor/ARB. Patient has not seen an ophthalmologist this year. Patient denies any issues with their feet. The symptom started onset as an adult hypertension and hyperlipidemia ARE RELATED TO DM   Hypertension Patient is currently on losartan and amlodipine, and their blood pressure today is 129/65. Patient denies any lightheadedness or dizziness. Patient denies headaches, blurred vision, chest pains, shortness of breath, or weakness. Denies any side effects from medication and is content with current medication.   Hyperlipidemia Patient is coming in for recheck of his hyperlipidemia. The patient is currently taking pravastatin. They deny any issues with myalgias or history of liver damage from it. They deny any focal numbness or weakness or chest pain.   Relevant past medical, surgical, family and social history reviewed and updated as indicated. Interim medical history since our last visit reviewed. Allergies and medications reviewed and updated.  Review of Systems  Constitutional:  Negative for chills and fever.  Eyes:  Negative for visual disturbance.  Respiratory:  Negative for shortness of breath and wheezing.   Cardiovascular:  Negative for chest pain and leg  swelling.  Musculoskeletal:  Negative for arthralgias, back pain and gait problem.  Skin:  Negative for rash.  Neurological:  Negative for dizziness, weakness and light-headedness.  All other systems reviewed and are negative.   Per HPI unless specifically indicated above   Allergies as of 11/27/2021   No Known Allergies      Medication List        Accurate as of November 27, 2021  8:50 AM. If you have any questions, ask your nurse or doctor.          Accu-Chek Guide test strip Generic drug: glucose blood Use as instructed to test blood sugar daily. DX: E11.65 (patient using guide meter now)   amLODipine 10 MG tablet Commonly known as: NORVASC Take 1 tablet (10 mg total) by mouth daily.   aspirin EC 81 MG tablet Take 81 mg by mouth daily.   Basaglar KwikPen 100 UNIT/ML Inject 70 Units into the skin daily. What changed: how much to take Changed by: Worthy Rancher, MD   cilostazol 100 MG tablet Commonly known as: PLETAL Take 1 tablet (100 mg total) by mouth 2 (two) times daily.   citalopram 20 MG tablet Commonly known as: CELEXA Take 1 tablet (20 mg total) by mouth daily.   FreeStyle Libre 2 Sensor Misc Use to check blood sugar 4 times daily. Change sensor every 14 days. DX: E11.9   glucose monitoring kit monitoring kit 1 each by Does not apply route 2 (two) times daily before a meal.  hydrochlorothiazide 25 MG tablet Commonly known as: HYDRODIURIL Take 1 tablet (25 mg total) by mouth daily.   Insulin Pen Needle 31G X 8 MM Misc Commonly known as: B-D ULTRAFINE III SHORT PEN 1 each by Does not apply route as directed.   lidocaine 5 % ointment Commonly known as: XYLOCAINE Apply 1 application topically 3 (three) times daily as needed.   losartan 100 MG tablet Commonly known as: COZAAR Take 1 tablet (100 mg total) by mouth daily.   metFORMIN 500 MG 24 hr tablet Commonly known as: Glucophage XR Take 2 tablets (1,000 mg total) by mouth in the morning  and at bedtime.   NovoLOG FlexPen 100 UNIT/ML FlexPen Generic drug: insulin aspart Inject 10 Units into the skin 3 (three) times daily with meals. What changed: how much to take Changed by: Fransisca Kaufmann Saba Gomm, MD   Potassium Chloride ER 20 MEQ Tbcr Take 20 mEq by mouth daily.   pravastatin 80 MG tablet Commonly known as: PRAVACHOL Take 1 tablet (80 mg total) by mouth daily.   Trulicity 4.5 HW/2.9HB Sopn Generic drug: Dulaglutide Inject 4.5 mg as directed once a week.         Objective:   BP 129/65   Pulse 82   Temp 98.2 F (36.8 C)   Ht 5' 9" (1.753 m)   Wt 181 lb (82.1 kg)   SpO2 98%   BMI 26.73 kg/m   Wt Readings from Last 3 Encounters:  11/27/21 181 lb (82.1 kg)  08/14/21 178 lb (80.7 kg)  05/15/21 172 lb (78 kg)    Physical Exam Vitals and nursing note reviewed.  Constitutional:      General: He is not in acute distress.    Appearance: He is well-developed. He is not diaphoretic.  Eyes:     General: No scleral icterus.    Conjunctiva/sclera: Conjunctivae normal.  Neck:     Thyroid: No thyromegaly.  Cardiovascular:     Rate and Rhythm: Normal rate and regular rhythm.     Heart sounds: Normal heart sounds. No murmur heard. Pulmonary:     Effort: Pulmonary effort is normal. No respiratory distress.     Breath sounds: Normal breath sounds. No wheezing.  Musculoskeletal:        General: Normal range of motion.     Cervical back: Neck supple.  Lymphadenopathy:     Cervical: No cervical adenopathy.  Skin:    General: Skin is warm and dry.     Findings: No rash.  Neurological:     Mental Status: He is alert and oriented to person, place, and time.     Coordination: Coordination normal.  Psychiatric:        Behavior: Behavior normal.       Assessment & Plan:   Problem List Items Addressed This Visit       Cardiovascular and Mediastinum   Essential hypertension, benign   Relevant Orders   CBC with Differential/Platelet   CMP14+EGFR   Lipid  panel   Bayer DCA Hb A1c Waived     Endocrine   Type 2 diabetes mellitus with other specified complication (HCC) - Primary   Relevant Medications   Continuous Blood Gluc Sensor (FREESTYLE LIBRE 2 SENSOR) MISC   Insulin Glargine (BASAGLAR KWIKPEN) 100 UNIT/ML   insulin aspart (NOVOLOG FLEXPEN) 100 UNIT/ML FlexPen   Other Relevant Orders   CBC with Differential/Platelet   CMP14+EGFR   Lipid panel   Bayer DCA Hb A1c Waived     Other  Mixed hyperlipidemia   Relevant Orders   CBC with Differential/Platelet   CMP14+EGFR   Lipid panel   Bayer DCA Hb A1c Waived   Depression, recurrent (HCC)   GAD (generalized anxiety disorder)  A1c is worse, will increase lispro overall, will add NovoLog and will try for freestyle libre again.  He is only taking metformin once per day because he cannot tolerate due to abdominal issues.  Follow up plan: Return in about 3 months (around 02/27/2022), or if symptoms worsen or fail to improve, for Diabetes and hypertension and cholesterol recheck.  Counseling provided for all of the vaccine components Orders Placed This Encounter  Procedures   CBC with Differential/Platelet   CMP14+EGFR   Lipid panel   Bayer DCA Hb A1c Waived    Caryl Pina, MD [facility] 11/27/2021, 8:50 AM

## 2021-11-27 NOTE — Patient Instructions (Signed)
Increase Basaglar to 70 units daily and NovoLog 10 units 3 times a day with meals, take every time you are going to eat a meal. Sent prescription for CGM, please make an appointment soon with Lottie Dawson pharmacist

## 2021-11-28 LAB — CMP14+EGFR
ALT: 15 IU/L (ref 0–44)
AST: 12 IU/L (ref 0–40)
Albumin/Globulin Ratio: 2 (ref 1.2–2.2)
Albumin: 4.3 g/dL (ref 3.8–4.8)
Alkaline Phosphatase: 105 IU/L (ref 44–121)
BUN/Creatinine Ratio: 15 (ref 10–24)
BUN: 15 mg/dL (ref 8–27)
Bilirubin Total: 0.3 mg/dL (ref 0.0–1.2)
CO2: 27 mmol/L (ref 20–29)
Calcium: 10 mg/dL (ref 8.6–10.2)
Chloride: 95 mmol/L — ABNORMAL LOW (ref 96–106)
Creatinine, Ser: 1.03 mg/dL (ref 0.76–1.27)
Globulin, Total: 2.2 g/dL (ref 1.5–4.5)
Glucose: 198 mg/dL — ABNORMAL HIGH (ref 70–99)
Potassium: 5 mmol/L (ref 3.5–5.2)
Sodium: 138 mmol/L (ref 134–144)
Total Protein: 6.5 g/dL (ref 6.0–8.5)
eGFR: 79 mL/min/{1.73_m2} (ref >59)

## 2021-11-28 LAB — LIPID PANEL
Chol/HDL Ratio: 3.5 ratio (ref 0.0–5.0)
Cholesterol, Total: 134 mg/dL (ref 100–199)
HDL: 38 mg/dL — ABNORMAL LOW (ref >39)
LDL Chol Calc (NIH): 69 mg/dL (ref 0–99)
Triglycerides: 157 mg/dL — ABNORMAL HIGH (ref 0–149)
VLDL Cholesterol Cal: 27 mg/dL (ref 5–40)

## 2021-11-28 LAB — CBC WITH DIFFERENTIAL/PLATELET
Basophils Absolute: 0.1 10*3/uL (ref 0.0–0.2)
Basos: 1 %
EOS (ABSOLUTE): 0.1 10*3/uL (ref 0.0–0.4)
Eos: 1 %
Hematocrit: 42 % (ref 37.5–51.0)
Hemoglobin: 14.5 g/dL (ref 13.0–17.7)
Immature Grans (Abs): 0.1 10*3/uL (ref 0.0–0.1)
Immature Granulocytes: 1 %
Lymphocytes Absolute: 1.9 10*3/uL (ref 0.7–3.1)
Lymphs: 20 %
MCH: 33 pg (ref 26.6–33.0)
MCHC: 34.5 g/dL (ref 31.5–35.7)
MCV: 96 fL (ref 79–97)
Monocytes Absolute: 0.6 10*3/uL (ref 0.1–0.9)
Monocytes: 7 %
Neutrophils Absolute: 6.7 10*3/uL (ref 1.4–7.0)
Neutrophils: 70 %
Platelets: 266 10*3/uL (ref 150–450)
RBC: 4.4 x10E6/uL (ref 4.14–5.80)
RDW: 12.3 % (ref 11.6–15.4)
WBC: 9.5 10*3/uL (ref 3.4–10.8)

## 2021-12-04 ENCOUNTER — Telehealth: Payer: Medicare PPO

## 2021-12-05 DIAGNOSIS — E1142 Type 2 diabetes mellitus with diabetic polyneuropathy: Secondary | ICD-10-CM | POA: Diagnosis not present

## 2021-12-05 DIAGNOSIS — B351 Tinea unguium: Secondary | ICD-10-CM | POA: Diagnosis not present

## 2021-12-05 DIAGNOSIS — L84 Corns and callosities: Secondary | ICD-10-CM | POA: Diagnosis not present

## 2021-12-05 DIAGNOSIS — M79676 Pain in unspecified toe(s): Secondary | ICD-10-CM | POA: Diagnosis not present

## 2021-12-17 ENCOUNTER — Ambulatory Visit: Payer: Medicare PPO | Admitting: Pharmacist

## 2022-01-01 ENCOUNTER — Other Ambulatory Visit: Payer: Self-pay

## 2022-01-01 ENCOUNTER — Telehealth: Payer: Self-pay | Admitting: Family Medicine

## 2022-01-01 DIAGNOSIS — Z794 Long term (current) use of insulin: Secondary | ICD-10-CM

## 2022-01-01 MED ORDER — TRULICITY 4.5 MG/0.5ML ~~LOC~~ SOAJ: 4.5000 mg | SUBCUTANEOUS | 3 refills | Status: DC

## 2022-01-01 NOTE — Telephone Encounter (Signed)
Tried calling pt. No answer and vmail not set up.  Looks like pt does need a refill of Trulicity. Refills sent to mail order pharmacy ( [facility]).  Pt should have refills of Novolog and Engineer, agricultural at his Management consultant ( [facility] in [place]).

## 2022-01-01 NOTE — Telephone Encounter (Signed)
Pt states that he about to run out of his insulins. Needs advise on what to do. Please call pt.

## 2022-01-08 ENCOUNTER — Telehealth: Payer: No Typology Code available for payment source

## 2022-01-14 ENCOUNTER — Telehealth: Payer: Medicare PPO

## 2022-01-23 ENCOUNTER — Telehealth: Payer: No Typology Code available for payment source

## 2022-01-29 ENCOUNTER — Telehealth: Payer: Self-pay | Admitting: Family Medicine

## 2022-01-29 DIAGNOSIS — E1169 Type 2 diabetes mellitus with other specified complication: Secondary | ICD-10-CM

## 2022-01-29 NOTE — Telephone Encounter (Signed)
Pt has been informed of all and understood. He will come by tomorrow 8/23 to pick up samples.

## 2022-01-29 NOTE — Telephone Encounter (Signed)
Please let patient know:  Trulicity samples left in fridge for patient to pick up We only have the 1.'5mg'$  weekly dose Insulin samples also placed in bag--70 units daily Toujeo (just like his once daily basal insulin he takes) He needs to pick up by Friday due to fridge overload   He needs to keep appts with me in order to continue assistance Multiple attempts have been made to reach patient over the past few months  Will route to [employer] for assistance with CCM scheduling  New CCM [id number] placed

## 2022-01-30 ENCOUNTER — Telehealth: Payer: Self-pay

## 2022-01-30 NOTE — Chronic Care Management (AMB) (Signed)
  Chronic Care Management   Note  01/30/2022 Name: DONTAYE HUR MRN: [id number] DOB: [birth year]  RONN SMOLINSKY is a 71 y.o. year old male who is a primary care patient of Dettinger, Fransisca Kaufmann, MD. I reached out to [place] by phone today in response to a referral sent by Mr. Haskell Flirt Colden's PCP.  Mr. Chalfin was given information about Chronic Care Management services today including:  CCM service includes personalized support from designated clinical staff supervised by his physician, including individualized plan of care and coordination with other care providers 24/7 contact phone numbers for assistance for urgent and routine care needs. Service will only be billed when office clinical staff spend 20 minutes or more in a month to coordinate care. Only one practitioner may furnish and bill the service in a calendar month. The patient may stop CCM services at any time (effective at the end of the month) by phone call to the office staff. The patient is responsible for co-pay (up to 20% after annual deductible is met) if co-pay is required by the individual health plan.   Patient agreed to services and verbal consent obtained.   Follow up plan: Face to Face appointment with care management team member scheduled for: 02/06/2022  Noreene Larsson, [place], [place] [address] Direct Dial: [phone] [email or web address]

## 2022-02-06 ENCOUNTER — Telehealth: Payer: Self-pay | Admitting: Family Medicine

## 2022-02-06 ENCOUNTER — Ambulatory Visit (INDEPENDENT_AMBULATORY_CARE_PROVIDER_SITE_OTHER): Payer: No Typology Code available for payment source | Admitting: Pharmacist

## 2022-02-06 DIAGNOSIS — E782 Mixed hyperlipidemia: Secondary | ICD-10-CM

## 2022-02-06 DIAGNOSIS — E1169 Type 2 diabetes mellitus with other specified complication: Secondary | ICD-10-CM

## 2022-02-06 DIAGNOSIS — Z794 Long term (current) use of insulin: Secondary | ICD-10-CM

## 2022-02-06 MED ORDER — TRULICITY 4.5 MG/0.5ML ~~LOC~~ SOAJ: 4.5000 mg | SUBCUTANEOUS | 3 refills | Status: DC

## 2022-02-06 MED ORDER — INSULIN LISPRO (1 UNIT DIAL) 100 UNIT/ML (KWIKPEN): 10.0000 [IU] | PEN_INJECTOR | Freq: Three times a day (TID) | SUBCUTANEOUS | 11 refills | Status: DC

## 2022-02-06 MED ORDER — BASAGLAR KWIKPEN 100 UNIT/ML ~~LOC~~ SOPN: 70.0000 [IU] | PEN_INJECTOR | Freq: Every day | SUBCUTANEOUS | 5 refills | Status: DC

## 2022-02-06 NOTE — Progress Notes (Signed)
Chronic Care Management Pharmacy Note  02/06/2022 Name:  Randy [name] MRN:  [id number] DOB:  June 02, 1951  Summary: Diabetes: Uncontrolled--A1C 8.6%-->10.3%; current treatment:BASAGLAR 60 UNITS DAILY, METFORMIN, TRULICITY 5.4MG--QQPYPP meal time insulin (Humalog 10-15 units with meals 3 times daily);  Unable to tolerate farxiga due to frequent urination--may retrial if not controlled PATIENT RE-ENROLLED IN [employer] PATIENT ASSISTANCE PROGRAM  Received notification from [employer] regarding approval for [place]. Patient assistance approved from 06/22/21 to 06/08/22. Phone: [phone] Escribed to [facility] mail order pharmacy (on behalf of [employer] patient assistance) Current glucose readings: fasting glucose: 145, post prandial glucose: N/A--PATIENT UNABLE TO CHECK AT HOME (METER ERRORS);  SUBMITTED LIBRE 2 CGM (DEVICE & SENSORS-53MONTH SUPPLY X1 YR REFILLS) TO DME COMPANY -->PATIENT'S INSURANCE REQUIRES 20% COPAY Denies hypoglycemic/hyperglycemic symptoms (DOESN'T CHECK BLOOD SUGAR AT HOME) Discussed meal planning options and Plate method for healthy eating Avoid sugary drinks and desserts Incorporate balanced protein, non starchy veggies, 1 serving of carbohydrate with each meal Increase water intake Increase physical activity as able Current exercise: N/A Recommended adding meal time humalog 10-15 units with meals 3 times daily, increase basaglar by 5-10 units Assessed patient finances. Re-enrollment submitted to the [employer] patient assistance program (trulicity, basaglar, humalog) for 2023  Subjective: Randy [name] is an 71 y.o. year old male who is a primary patient of Dettinger, Fransisca Kaufmann, MD.  The CCM team was consulted for assistance with disease management and care coordination needs.    Engaged with patient face to face for follow up visit in response to provider referral for pharmacy case management and/or care coordination services.    Consent to Services:  The patient was given information about Chronic Care Management services, agreed to services, and gave verbal consent prior to initiation of services.  Please see initial visit note for detailed documentation.   Patient Care Team: Dettinger, Fransisca Kaufmann, MD as PCP - General (Family Medicine) Lavera Guise, [facility] (Pharmacist)  Objective:  Lab Results  Component Value Date   CREATININE 1.03 11/27/2021   CREATININE 1.03 05/15/2021   CREATININE 1.08 10/31/2020    Lab Results  Component Value Date   HGBA1C 10.3 (H) 11/27/2021   Last diabetic Eye exam:  Lab Results  Component Value Date/Time   HMDIABEYEEXA No Retinopathy 01/06/2015 12:00 AM       Component Value Date/Time   CHOL 134 11/27/2021 0813   TRIG 157 (H) 11/27/2021 0813   HDL 38 (L) 11/27/2021 0813   CHOLHDL 3.5 11/27/2021 0813   LDLCALC 69 11/27/2021 0813       Latest Ref Rng & Units 11/27/2021    8:13 AM 05/15/2021    8:25 AM 10/31/2020    8:00 AM  Hepatic Function  Total Protein 6.0 - 8.5 g/dL 6.5  6.5  6.7   Albumin 3.8 - 4.8 g/dL 4.3  4.5  4.7   AST 0 - 40 IU/L _0 ALT 0 - 44 IU/L _1 Alk Phosphatase 44 - 121 IU/L 105  101  101   Total Bilirubin 0.0 - 1.2 mg/dL 0.3  0.4  0.5     No results found for: "TSH", "FREET4"     Latest Ref Rng & Units 11/27/2021    8:13 AM 05/15/2021    8:25 AM 10/31/2020    8:00 AM  CBC  WBC 3.4 - 10.8 x10E3/uL 9.5  9.3  9.2   Hemoglobin  13.0 - 17.7 g/dL 14.5  15.4  15.6   Hematocrit 37.5 - 51.0 % 42.0  44.5  43.5   Platelets 150 - 450 x10E3/uL 266  219  208     No results found for: "VD25OH"  Clinical ASCVD: No  The 10-year ASCVD risk score (Arnett DK, et al., 2019) is: 32.2%   Values used to calculate the score:     Age: 70 years     Sex: Male     Is Non-Hispanic African American: No     Diabetic: Yes     Tobacco smoker: No     Systolic Blood Pressure: 129 mmHg     Is BP treated: Yes     HDL Cholesterol: 38 mg/dL     Total  Cholesterol: 134 mg/dL    Other: (CHADS2VASc if Afib, PHQ9 if depression, MMRC or CAT for COPD, ACT, DEXA)  Social History   Tobacco Use  Smoking Status Former   Packs/day: 1.00   Years: 40.00   Total pack years: 40.00   Types: Pipe, Cigarettes   Quit date: 02/08/2020   Years since quitting: 2.0  Smokeless Tobacco Never  Tobacco Comments   Less than 1 pk per day   BP Readings from Last 3 Encounters:  11/27/21 129/65  08/14/21 (!) 143/68  05/15/21 (!) 153/80   Pulse Readings from Last 3 Encounters:  11/27/21 82  08/14/21 74  05/15/21 87   Wt Readings from Last 3 Encounters:  11/27/21 181 lb (82.1 kg)  08/14/21 178 lb (80.7 kg)  05/15/21 172 lb (78 kg)    Assessment: Review of patient past medical history, allergies, medications, health status, including review of consultants reports, laboratory and other test data, was performed as part of comprehensive evaluation and provision of chronic care management services.   SDOH:  (Social Determinants of Health) assessments and interventions performed:  SDOH Interventions    Flowsheet Row Office Visit from 10/31/2020 in [facility] Office Visit from 09/05/2020 in [facility] Office Visit from 11/16/2019 in [facility]  SDOH Interventions     Depression Interventions/Treatment  Currently on Treatment Currently on Treatment PHQ2-9 Score <4 Follow-up Not Indicated       CCM Care Plan  No Known Allergies  Medications Reviewed Today     Reviewed by ,  D, RPH (Pharmacist) on 02/06/22 at 0940  Med List Status: <None>   Medication Order Taking? Sig Documenting Provider Last Dose Status Informant  amLODipine (NORVASC) 10 MG tablet [id number] No Take 1 tablet (10 mg total) by mouth daily. Dettinger, Joshua A, MD Taking Active   aspirin EC 81 MG tablet [id number] No Take 81 mg by mouth daily. [provider] Taking Active Self  cilostazol (PLETAL)  100 MG tablet [id number] No Take 1 tablet (100 mg total) by mouth 2 (two) times daily. Dettinger, Joshua A, MD Taking Active   citalopram (CELEXA) 20 MG tablet [id number] No Take 1 tablet (20 mg total) by mouth daily. Dettinger, Joshua A, MD Taking Active   Continuous Blood Gluc Sensor (FREESTYLE LIBRE 2 SENSOR) MISC [id number]  Use to check blood sugar 4 times daily. Change sensor every 14 days. DX: E11.9 Dettinger, Joshua A, MD  Active   Dulaglutide (TRULICITY) 4.5 MG/0.5ML SOPN [id number]  Inject 4.5 mg as directed once a week. Dettinger, Joshua A, MD  Active   glucose blood (ACCU-CHEK GUIDE) test strip [id number] No Use as instructed to test blood sugar daily. DX: E11.65 (patient   using guide meter now) Dettinger, Joshua A, MD Taking Active   glucose monitoring kit (FREESTYLE) monitoring kit [id number] No 1 each by Does not apply route 2 (two) times daily before a meal. Dettinger, Joshua A, MD Taking Active   hydrochlorothiazide (HYDRODIURIL) 25 MG tablet [id number] No Take 1 tablet (25 mg total) by mouth daily. Dettinger, Joshua A, MD Taking Active   insulin aspart (NOVOLOG FLEXPEN) 100 UNIT/ML FlexPen [id number]  Inject 10 Units into the skin 3 (three) times daily with meals. Dettinger, Joshua A, MD  Active   Insulin Glargine (BASAGLAR KWIKPEN) 100 UNIT/ML [id number]  Inject 70 Units into the skin daily. Dettinger, Joshua A, MD  Active   Insulin Pen Needle (B-D ULTRAFINE III SHORT PEN) 31G X 8 MM MISC [id number] No 1 each by Does not apply route as directed. Nida, Gebreselassie W, MD Taking Active   lidocaine (XYLOCAINE) 5 % ointment [id number] No Apply 1 application topically 3 (three) times daily as needed. Dettinger, Joshua A, MD Taking Active   losartan (COZAAR) 100 MG tablet [id number] No Take 1 tablet (100 mg total) by mouth daily. Dettinger, Joshua A, MD Taking Active   metFORMIN (GLUCOPHAGE XR) 500 MG 24 hr tablet [id number] No Take 2 tablets (1,000 mg total) by mouth in the morning and at bedtime.  Dettinger, Joshua A, MD Taking Active   Potassium Chloride ER 20 MEQ TBCR [id number] No Take 20 mEq by mouth daily.  [provider] Taking Active Self  pravastatin (PRAVACHOL) 80 MG tablet [id number] No Take 1 tablet (80 mg total) by mouth daily. Dettinger, Joshua A, MD Taking Active             Patient Active Problem List   Diagnosis Date Noted   GAD (generalized anxiety disorder) 09/19/2020   Atherosclerosis of native arteries of extremity with intermittent claudication (HCC) 10/21/2017   Anaphylactic syndrome 10/21/2016   Current smoker 06/04/2016   Depression, recurrent (HCC) 06/04/2016   Type 2 diabetes mellitus with other specified complication (HCC) 05/09/2015   Essential hypertension, benign 05/09/2015   Mixed hyperlipidemia 05/09/2015    Immunization History  Administered Date(s) Administered   Fluad Quad(high Dose 65+) 04/27/2019, 05/02/2020, 05/15/2021   Influenza Split 06/09/2010   Influenza, High Dose Seasonal PF 04/07/2017, 03/10/2018   Influenza, Seasonal, Injecte, Preservative Fre 03/02/2014, 04/04/2015   Influenza,inj,Quad PF,6+ Mos 03/02/2014   Influenza-Unspecified 04/07/2017   Moderna Sars-Covid-2 Vaccination 08/18/2019, 09/17/2019   Pneumococcal Polysaccharide-23 06/09/2005   Tdap 10/26/1998   Zoster Recombinat (Shingrix) 09/30/2021, 11/27/2021    Conditions to be addressed/monitored: CAD, DMII, and CKD Stage 2  Care Plan : PHARMD MEDICATION MANAGEMENT  Updates made by ,  D, RPH since 02/25/2022 12:00 AM     Problem: DISEASE PROGRESSION PREVENTION      Long-Range Goal: T2DM   Recent Progress: Not on track  Priority: High  Note:   Current Barriers:  Unable to independently afford treatment regimen Unable to achieve control of T2DM   Pharmacist Clinical Goal(s):  Over the next 90 days, patient will verbalize ability to afford treatment regimen achieve control of T2DM as evidenced by IMPROVED GLYCEMIC CONTROL, GOAL A1C <7%   through collaboration with PharmD and provider.   Interventions: 1:1 collaboration with Dettinger, Joshua A, MD regarding development and update of comprehensive plan of care as evidenced by provider attestation and co-signature Inter-disciplinary care team collaboration (see longitudinal plan of care) Comprehensive medication review performed; medication list updated in electronic medical record  Diabetes: Uncontrolled--A1C 8.6%-->10.3%; current treatment:BASAGLAR 60   UNITS DAILY, METFORMIN, TRULICITY 4.5MG--adding meal time insulin (Humalog 10-15 units with meals 3 times daily);  Unable to tolerate farxiga due to frequent urination--may retrial if not controlled PATIENT RE-ENROLLED IN [employer] PATIENT ASSISTANCE PROGRAM  Received notification from [employer] regarding approval for BASAGLAR KWIKPEN & TRULICITY. Patient assistance approved from 06/22/21 to 06/08/22. Phone: [phone] Escribed to [facility] mail order pharmacy (on behalf of [employer] patient assistance) Current glucose readings: fasting glucose: 145, post prandial glucose: N/A--PATIENT UNABLE TO CHECK AT HOME (METER ERRORS);  SUBMITTED LIBRE 2 CGM (DEVICE & SENSORS-3MONTH SUPPLY X1 YR REFILLS) TO DME COMPANY -->PATIENT'S INSURANCE REQUIRES 20% COPAY Denies hypoglycemic/hyperglycemic symptoms (DOESN'T CHECK BLOOD SUGAR AT HOME) Discussed meal planning options and Plate method for healthy eating Avoid sugary drinks and desserts Incorporate balanced protein, non starchy veggies, 1 serving of carbohydrate with each meal Increase water intake Increase physical activity as able Current exercise: N/A Recommended adding meal time humalog 10-15 units with meals 3 times daily, increase basaglar by 5-10 units Assessed patient finances. Re-enrollment submitted to the [employer] patient assistance program (trulicity, basaglar, humalog) for 2023  HYPERLIPIDEMIA: --WILL ADDRESS AT NEXT VISIT AND POTENTIALLY CHANGE TO [place]   --LDL Improved to 78  Patient Goals/Self-Care Activities Over the next 90 days, patient will:  - take medications as prescribed check glucose using CGM, document, and provide at future appointments  Follow Up Plan: Telephone follow up appointment with care management team member scheduled for: 1 month      Follow Up:  Patient agrees to Care Plan and Follow-up.  Plan: Telephone follow up appointment with care management team member scheduled for:  1 month     [place] , PharmD, BCPS Clinical Pharmacist, [facility] [place]  [place] Phone [phone]      

## 2022-02-06 NOTE — Patient Instructions (Addendum)
Visit Information  Following are the goals we discussed today:  Current Barriers:  Unable to independently afford treatment regimen Unable to achieve control of T2DM   Pharmacist Clinical Goal(s):  Over the next 90 days, patient will verbalize ability to afford treatment regimen achieve control of T2DM as evidenced by IMPROVED GLYCEMIC CONTROL, GOAL A1C <7% through collaboration with PharmD and provider.   Interventions: 1:1 collaboration with Dettinger, Fransisca Kaufmann, MD regarding development and update of comprehensive plan of care as evidenced by provider attestation and co-signature Inter-disciplinary care team collaboration (see longitudinal plan of care) Comprehensive medication review performed; medication list updated in electronic medical record  Diabetes: Uncontrolled--A1C 8.6%-->10.3%; current treatment:BASAGLAR 60 UNITS DAILY, METFORMIN, TRULICITY 4.'5MG'$ --adding meal time insulin (Humalog 10-15 units with meals 3 times daily);  Unable to tolerate farxiga due to frequent urination--may retrial if not controlled PATIENT RE-ENROLLED IN [facility] PATIENT ASSISTANCE PROGRAM  Received notification from [facility] regarding approval for [facility]. Patient assistance approved from 06/22/21 to 06/08/22. Phone: [phone] Escribed to [facility] mail order pharmacy (on behalf of [facility] patient assistance) Current glucose readings: fasting glucose: 145, post prandial glucose: N/A--PATIENT UNABLE TO CHECK AT HOME (METER ERRORS);  SUBMITTED LIBRE 2 CGM (DEVICE & SENSORS-75MONTH SUPPLY X1 YR REFILLS) TO DME COMPANY -->PATIENT'S INSURANCE REQUIRES 20% COPAY Denies hypoglycemic/hyperglycemic symptoms (DOESN'T CHECK BLOOD SUGAR AT HOME) Discussed meal planning options and Plate method for healthy eating Avoid sugary drinks and desserts Incorporate balanced protein, non starchy veggies, 1 serving of carbohydrate with each meal Increase water intake Increase physical  activity as able Current exercise: N/A Recommended adding meal time humalog 10-15 units with meals 3 times daily, increase basaglar by 5-10 units Assessed patient finances. Re-enrollment submitted to the [facility] patient assistance program (trulicity, basaglar, humalog) for 2023  HYPERLIPIDEMIA: --WILL ADDRESS AT NEXT VISIT AND POTENTIALLY CHANGE TO ROSUVASTATIN  --LDL Improved to 78  Patient Goals/Self-Care Activities Over the next 90 days, patient will:  - take medications as prescribed check glucose using CGM, document, and provide at future appointments  Follow Up Plan: Telephone follow up appointment with care management team member scheduled for: 1 month   Plan: Telephone follow up appointment with care management team member scheduled for:  1 month  Signature Regina Eck, PharmD, BCPS Clinical Pharmacist, [place]  II Phone [phone]   Please call the care guide team at [phone] if you need to cancel or reschedule your appointment.   The patient verbalized understanding of instructions, educational materials, and care plan provided today and DECLINED offer to receive copy of patient instructions, educational materials, and care plan.

## 2022-02-06 NOTE — Telephone Encounter (Signed)
Paperwork was received and placed on PCPs desk

## 2022-02-13 DIAGNOSIS — E1142 Type 2 diabetes mellitus with diabetic polyneuropathy: Secondary | ICD-10-CM | POA: Diagnosis not present

## 2022-02-13 DIAGNOSIS — M79676 Pain in unspecified toe(s): Secondary | ICD-10-CM | POA: Diagnosis not present

## 2022-02-13 DIAGNOSIS — L84 Corns and callosities: Secondary | ICD-10-CM | POA: Diagnosis not present

## 2022-02-13 DIAGNOSIS — B351 Tinea unguium: Secondary | ICD-10-CM | POA: Diagnosis not present

## 2022-02-26 ENCOUNTER — Telehealth: Payer: No Typology Code available for payment source | Admitting: Pharmacist

## 2022-02-26 ENCOUNTER — Telehealth: Payer: Self-pay | Admitting: Pharmacist

## 2022-02-26 NOTE — Telephone Encounter (Signed)
Patient received basaglar and humalog via [facility]  Trulicity is out of stock via [facility] patient assistance  Trulicity 6 weeks 4.'5mg'$  samples given to patient   Encouraged compliance

## 2022-02-27 ENCOUNTER — Ambulatory Visit: Payer: No Typology Code available for payment source | Admitting: Family Medicine

## 2022-03-04 ENCOUNTER — Telehealth: Payer: Self-pay | Admitting: Family Medicine

## 2022-03-04 DIAGNOSIS — Z794 Long term (current) use of insulin: Secondary | ICD-10-CM

## 2022-03-06 MED ORDER — FREESTYLE LIBRE 2 SENSOR MISC: 11 refills | Status: DC

## 2022-03-06 NOTE — Telephone Encounter (Signed)
Sent refill for sensor, libre 2

## 2022-03-06 NOTE — Telephone Encounter (Signed)
Pt informed. He has no further concerns. 

## 2022-03-26 ENCOUNTER — Other Ambulatory Visit: Payer: Self-pay | Admitting: Family Medicine

## 2022-03-26 DIAGNOSIS — F411 Generalized anxiety disorder: Secondary | ICD-10-CM

## 2022-03-26 DIAGNOSIS — F339 Major depressive disorder, recurrent, unspecified: Secondary | ICD-10-CM

## 2022-04-02 ENCOUNTER — Encounter: Payer: Self-pay | Admitting: Family Medicine

## 2022-04-02 ENCOUNTER — Ambulatory Visit (INDEPENDENT_AMBULATORY_CARE_PROVIDER_SITE_OTHER): Payer: No Typology Code available for payment source | Admitting: Family Medicine

## 2022-04-02 VITALS — BP 146/75 | HR 91 | Temp 97.2°F | Ht 69.0 in | Wt 182.0 lb

## 2022-04-02 DIAGNOSIS — E782 Mixed hyperlipidemia: Secondary | ICD-10-CM

## 2022-04-02 DIAGNOSIS — Z87891 Personal history of nicotine dependence: Secondary | ICD-10-CM | POA: Diagnosis not present

## 2022-04-02 DIAGNOSIS — I10 Essential (primary) hypertension: Secondary | ICD-10-CM | POA: Diagnosis not present

## 2022-04-02 DIAGNOSIS — E1169 Type 2 diabetes mellitus with other specified complication: Secondary | ICD-10-CM

## 2022-04-02 DIAGNOSIS — Z23 Encounter for immunization: Secondary | ICD-10-CM

## 2022-04-02 DIAGNOSIS — Z794 Long term (current) use of insulin: Secondary | ICD-10-CM

## 2022-04-02 DIAGNOSIS — F411 Generalized anxiety disorder: Secondary | ICD-10-CM | POA: Diagnosis not present

## 2022-04-02 DIAGNOSIS — E119 Type 2 diabetes mellitus without complications: Secondary | ICD-10-CM

## 2022-04-02 DIAGNOSIS — F339 Major depressive disorder, recurrent, unspecified: Secondary | ICD-10-CM | POA: Diagnosis not present

## 2022-04-02 LAB — BAYER DCA HB A1C WAIVED: HB A1C (BAYER DCA - WAIVED): 9 % — ABNORMAL HIGH (ref 4.8–5.6)

## 2022-04-02 MED ORDER — CILOSTAZOL 100 MG PO TABS: 100.0000 mg | ORAL_TABLET | Freq: Two times a day (BID) | ORAL | 3 refills | Status: DC

## 2022-04-02 MED ORDER — HYDROCHLOROTHIAZIDE 25 MG PO TABS: 25.0000 mg | ORAL_TABLET | Freq: Every day | ORAL | 3 refills | Status: DC

## 2022-04-02 MED ORDER — LOSARTAN POTASSIUM 100 MG PO TABS: 100.0000 mg | ORAL_TABLET | Freq: Every day | ORAL | 3 refills | Status: DC

## 2022-04-02 MED ORDER — BD PEN NEEDLE SHORT U/F 31G X 8 MM MISC: 1.0000 | 3 refills | Status: DC

## 2022-04-02 MED ORDER — AMLODIPINE BESYLATE 10 MG PO TABS: 10.0000 mg | ORAL_TABLET | Freq: Every day | ORAL | 3 refills | Status: DC

## 2022-04-02 MED ORDER — METFORMIN HCL ER 500 MG PO TB24: 1000.0000 mg | ORAL_TABLET | Freq: Two times a day (BID) | ORAL | 3 refills | Status: DC

## 2022-04-02 MED ORDER — CITALOPRAM HYDROBROMIDE 20 MG PO TABS: 20.0000 mg | ORAL_TABLET | Freq: Every day | ORAL | 3 refills | Status: DC

## 2022-04-02 MED ORDER — TRULICITY 4.5 MG/0.5ML ~~LOC~~ SOAJ: 4.5000 mg | SUBCUTANEOUS | 3 refills | Status: DC

## 2022-04-02 NOTE — Progress Notes (Signed)
BP (!) 146/75   Pulse 91   Temp (!) 97.2 F (36.2 C)   Ht _0  (1.753 m)   Wt 182 lb (82.6 kg)   SpO2 99%   BMI 26.88 kg/m    Subjective:   Patient ID: Randy [name], male    DOB: 1951-02-12, 71 y.o.   MRN: [id number]  HPI: Randy [name] is a 71 y.o. male presenting on 04/02/2022 for Medical Management of Chronic Issues, Diabetes, and Hypertension   HPI Type 2 diabetes mellitus Patient comes in today for recheck of his diabetes. Patient has been currently taking Basaglar 70 and 10 3 times daily and Trulicity 4.5 and metformin. Patient is currently on an ACE inhibitor/ARB. Patient has not seen an ophthalmologist this year. Patient denies any new issues with their feet. The symptom started onset as an adult htn and hld ARE RELATED TO DM   Hypertension Patient is currently on losartan and hydrochlorothiazide and amlodipine, and their blood pressure today is 146/75. Patient denies any lightheadedness or dizziness. Patient denies headaches, blurred vision, chest pains, shortness of breath, or weakness. Denies any side effects from medication and is content with current medication.   Hyperlipidemia Patient is coming in for recheck of his hyperlipidemia. The patient is currently taking pravastatin. They deny any issues with myalgias or history of liver damage from it. They deny any focal numbness or weakness or chest pain.   Relevant past medical, surgical, family and social history reviewed and updated as indicated. Interim medical history since our last visit reviewed. Allergies and medications reviewed and updated.  Review of Systems  Constitutional:  Negative for chills and fever.  Eyes:  Negative for visual disturbance.  Respiratory:  Negative for shortness of breath and wheezing.   Cardiovascular:  Negative for chest pain and leg swelling.  Musculoskeletal:  Negative for back pain and gait problem.  Skin:  Negative for rash.  Neurological:  Negative for dizziness,  weakness and light-headedness.  All other systems reviewed and are negative.   Per HPI unless specifically indicated above   Allergies as of 04/02/2022   No Known Allergies      Medication List        Accurate as of April 02, 2022  8:36 AM. If you have any questions, ask your nurse or doctor.          Accu-Chek Guide test strip Generic drug: glucose blood Use as instructed to test blood sugar daily. DX: E11.65 (patient using guide meter now)   amLODipine 10 MG tablet Commonly known as: NORVASC Take 1 tablet (10 mg total) by mouth daily.   aspirin EC 81 MG tablet Take 81 mg by mouth daily.   B-D ULTRAFINE III SHORT PEN 31G X 8 MM Misc Generic drug: Insulin Pen Needle 1 each by Does not apply route as directed.   Basaglar KwikPen 100 UNIT/ML Inject 70 Units into the skin daily.   cilostazol 100 MG tablet Commonly known as: PLETAL Take 1 tablet (100 mg total) by mouth 2 (two) times daily.   citalopram 20 MG tablet Commonly known as: CELEXA Take 1 tablet (20 mg total) by mouth daily.   FreeStyle Libre 2 Sensor Misc Use to check blood sugar 4 times daily. Change sensor every 14 days. DX: E11.9   glucose monitoring kit monitoring kit 1 each by Does not apply route 2 (two) times daily before a meal.   hydrochlorothiazide 25 MG tablet Commonly known as: HYDRODIURIL Take 1 tablet (25  mg total) by mouth daily.   insulin lispro 100 UNIT/ML KwikPen Commonly known as: HumaLOG KwikPen Inject 10 Units into the skin 3 (three) times daily with meals.   lidocaine 5 % ointment Commonly known as: XYLOCAINE Apply 1 application topically 3 (three) times daily as needed.   losartan 100 MG tablet Commonly known as: COZAAR Take 1 tablet (100 mg total) by mouth daily.   metFORMIN 500 MG 24 hr tablet Commonly known as: Glucophage XR Take 2 tablets (1,000 mg total) by mouth in the morning and at bedtime.   Potassium Chloride ER 20 MEQ Tbcr Take 20 mEq by mouth  daily.   pravastatin 80 MG tablet Commonly known as: PRAVACHOL Take 1 tablet (80 mg total) by mouth daily.   Trulicity 4.5 GQ/9.1QX Sopn Generic drug: Dulaglutide Inject 4.5 mg as directed once a week. DX: E11.65         Objective:   BP (!) 146/75   Pulse 91   Temp (!) 97.2 F (36.2 C)   Ht _0  (1.753 m)   Wt 182 lb (82.6 kg)   SpO2 99%   BMI 26.88 kg/m   Wt Readings from Last 3 Encounters:  04/02/22 182 lb (82.6 kg)  11/27/21 181 lb (82.1 kg)  08/14/21 178 lb (80.7 kg)    Physical Exam Vitals and nursing note reviewed.  Constitutional:      General: He is not in acute distress.    Appearance: He is well-developed. He is not diaphoretic.  Eyes:     General: No scleral icterus.    Conjunctiva/sclera: Conjunctivae normal.  Neck:     Thyroid: No thyromegaly.  Cardiovascular:     Rate and Rhythm: Normal rate and regular rhythm.     Heart sounds: Normal heart sounds. No murmur heard. Pulmonary:     Effort: Pulmonary effort is normal. No respiratory distress.     Breath sounds: Normal breath sounds. No wheezing.  Musculoskeletal:        General: Normal range of motion.     Cervical back: Neck supple.  Lymphadenopathy:     Cervical: No cervical adenopathy.  Skin:    General: Skin is warm and dry.     Findings: No rash.  Neurological:     Mental Status: He is alert and oriented to person, place, and time.     Coordination: Coordination normal.  Psychiatric:        Behavior: Behavior normal.       Assessment & Plan:   Problem List Items Addressed This Visit       Cardiovascular and Mediastinum   Essential hypertension, benign   Relevant Medications   amLODipine (NORVASC) 10 MG tablet   hydrochlorothiazide (HYDRODIURIL) 25 MG tablet   losartan (COZAAR) 100 MG tablet   Other Relevant Orders   Microalbumin / creatinine urine ratio   AMB Referral to Chronic Care Management Services     Endocrine   Type 2 diabetes mellitus with other specified  complication (HCC) - Primary   Relevant Medications   Dulaglutide (TRULICITY) 4.5 IH/0.3UU SOPN   losartan (COZAAR) 100 MG tablet   metFORMIN (GLUCOPHAGE XR) 500 MG 24 hr tablet   Other Relevant Orders   Bayer DCA Hb A1c Waived   Microalbumin / creatinine urine ratio   AMB Referral to Chronic Care Management Services     Other   Mixed hyperlipidemia   Relevant Medications   amLODipine (NORVASC) 10 MG tablet   hydrochlorothiazide (HYDRODIURIL) 25 MG tablet  losartan (COZAAR) 100 MG tablet   Other Relevant Orders   AMB Referral to Chronic Care Management Services   Depression, recurrent ([place])   Relevant Medications   citalopram (CELEXA) 20 MG tablet   GAD (generalized anxiety disorder)   Relevant Medications   citalopram (CELEXA) 20 MG tablet   Other Visit Diagnoses     Type 2 diabetes mellitus without complication, without long-term current use of insulin (HCC)       Relevant Medications   Dulaglutide (TRULICITY) 4.5 YV/8.5FY SOPN   losartan (COZAAR) 100 MG tablet   metFORMIN (GLUCOPHAGE XR) 500 MG 24 hr tablet   Other Relevant Orders   AMB Referral to Chronic Care Management Services   Stopped smoking with greater than 40 pack year history       Relevant Orders   CT CHEST LUNG CA SCREEN LOW DOSE W/O CM       Patient's A1c is improved but still high at 9.0, needs continued assistance in management, likely needs to bring meter in and check more than once a day, could possibly benefit from CGM if it will be covered.  Currently using insulin 4 times daily and should be checking probably at least 4 times daily.  BP elevated slightly, not going to adjust medicines at this point but would like him to monitor closely at home Follow up plan: Return in about 3 months (around 07/03/2022), or if symptoms worsen or fail to improve, for Diabetes and appointment with Almyra Free at some point for renewal of medication assistance.  Counseling provided for all of the vaccine components Orders  Placed This Encounter  Procedures   CT CHEST LUNG CA SCREEN LOW DOSE W/O CM   Bayer DCA Hb A1c Waived   Microalbumin / creatinine urine ratio   AMB Referral to Chronic Care Management Services    Caryl Pina, MD [facility] 04/02/2022, 8:36 AM

## 2022-04-04 LAB — MICROALBUMIN / CREATININE URINE RATIO
Creatinine, Urine: 63.3 mg/dL
Microalb/Creat Ratio: 319 mg/g creat — ABNORMAL HIGH (ref 0–29)
Microalbumin, Urine: 201.8 ug/mL

## 2022-04-07 ENCOUNTER — Telehealth: Payer: Self-pay | Admitting: Family Medicine

## 2022-04-07 MED ORDER — CITALOPRAM HYDROBROMIDE 40 MG PO TABS: 40.0000 mg | ORAL_TABLET | Freq: Every day | ORAL | 1 refills | Status: DC

## 2022-04-07 NOTE — Telephone Encounter (Signed)
Go ahead and increase his prescription to citalopram 40 mg, for now he can take 2-20 mg tablets and give it at least 2 or 3 weeks on that dose and let us know how it is doing.  Go ahead and make him an appointment for 3 to 4 weeks from now so we can follow-up on it.

## 2022-04-07 NOTE — Telephone Encounter (Signed)
Patient aware, new prescription sent to pharmacy and 4 week follow up scheduled.

## 2022-04-09 ENCOUNTER — Telehealth: Payer: Self-pay

## 2022-04-09 NOTE — Chronic Care Management (AMB) (Signed)
  Chronic Care Management   Note  04/09/2022 Name: SIRIUS WOODFORD MRN: [id number] DOB: Jan 27, 1951  VADEN BECHERER is a 71 y.o. year old male who is a primary care patient of Dettinger, Fransisca Kaufmann, MD. I reached out to [place] by phone today in response to a referral sent by Mr. Haskell Flirt Vinluan's PCP.  Mr. Richens was given information about Chronic Care Management services today including:  CCM service includes personalized support from designated clinical staff supervised by his physician, including individualized plan of care and coordination with other care providers 24/7 contact phone numbers for assistance for urgent and routine care needs. Service will only be billed when office clinical staff spend 20 minutes or more in a month to coordinate care. Only one practitioner may furnish and bill the service in a calendar month. The patient may stop CCM services at any time (effective at the end of the month) by phone call to the office staff. The patient is responsible for co-pay (up to 20% after annual deductible is met) if co-pay is required by the individual health plan.   Patient agreed to services and verbal consent obtained.   Follow up plan: Telephone appointment with care management team member scheduled HKG:[facility] 04/16/2022  Noreene Larsson, [place], [place] [address] Direct Dial: [phone] [email or web address]

## 2022-04-11 ENCOUNTER — Telehealth: Payer: Self-pay | Admitting: Family Medicine

## 2022-04-11 DIAGNOSIS — Z87891 Personal history of nicotine dependence: Secondary | ICD-10-CM | POA: Diagnosis not present

## 2022-04-11 DIAGNOSIS — F419 Anxiety disorder, unspecified: Secondary | ICD-10-CM | POA: Diagnosis not present

## 2022-04-11 NOTE — Telephone Encounter (Signed)
Pt called stating that PCP changed up his Celexa Rx and pt doesn't feel like its helping.  Needs advise.

## 2022-04-11 NOTE — Telephone Encounter (Signed)
Started Celexa last Monday. His anxiety is worse. Feels his heart beating faster. States that he can wait until Dettinger returns on Monday to discuss with him. I advised pt that it may take another week or so to get the full effect of the medication.

## 2022-04-14 NOTE — Telephone Encounter (Signed)
Yes I agree, it has been just 1 week give at least 1 more week

## 2022-04-14 NOTE — Telephone Encounter (Signed)
Pt aware of provider feedback and voiced understanding. 

## 2022-04-16 ENCOUNTER — Telehealth: Payer: No Typology Code available for payment source

## 2022-04-16 ENCOUNTER — Telehealth: Payer: Self-pay | Admitting: *Deleted

## 2022-04-16 NOTE — Telephone Encounter (Signed)
   CCM RN Visit Note   04/16/2022 Name: [name] MRN: [id number]      DOB: 01/23/1951  Subjective: Randy [name] is a 71 y.o. year old male who is a primary care patient of [facility]). The patient was referred to the Chronic Care Management team for assistance with care management needs subsequent to provider initiation of CCM services and plan of care.      An unsuccessful telephone outreach was attempted today to contact the patient about Chronic Care Management needs.    Plan:Telephone follow up appointment with care management team member scheduled for:  upon care guide rescheduling.  Jacqlyn Larsen RNC, BSN RN Case Manager [place] [phone]

## 2022-04-21 DIAGNOSIS — F419 Anxiety disorder, unspecified: Secondary | ICD-10-CM | POA: Diagnosis not present

## 2022-04-24 DIAGNOSIS — L84 Corns and callosities: Secondary | ICD-10-CM | POA: Diagnosis not present

## 2022-04-24 DIAGNOSIS — B351 Tinea unguium: Secondary | ICD-10-CM | POA: Diagnosis not present

## 2022-04-24 DIAGNOSIS — E1142 Type 2 diabetes mellitus with diabetic polyneuropathy: Secondary | ICD-10-CM | POA: Diagnosis not present

## 2022-04-24 DIAGNOSIS — M79676 Pain in unspecified toe(s): Secondary | ICD-10-CM | POA: Diagnosis not present

## 2022-04-25 ENCOUNTER — Telehealth: Payer: No Typology Code available for payment source

## 2022-04-25 ENCOUNTER — Telehealth: Payer: Self-pay | Admitting: *Deleted

## 2022-04-25 DIAGNOSIS — Z7984 Long term (current) use of oral hypoglycemic drugs: Secondary | ICD-10-CM | POA: Diagnosis not present

## 2022-04-25 DIAGNOSIS — Z7902 Long term (current) use of antithrombotics/antiplatelets: Secondary | ICD-10-CM | POA: Diagnosis not present

## 2022-04-25 DIAGNOSIS — Z20822 Contact with and (suspected) exposure to covid-19: Secondary | ICD-10-CM | POA: Diagnosis not present

## 2022-04-25 DIAGNOSIS — F419 Anxiety disorder, unspecified: Secondary | ICD-10-CM | POA: Diagnosis not present

## 2022-04-25 DIAGNOSIS — Z87891 Personal history of nicotine dependence: Secondary | ICD-10-CM | POA: Diagnosis not present

## 2022-04-25 DIAGNOSIS — F32A Depression, unspecified: Secondary | ICD-10-CM | POA: Diagnosis not present

## 2022-04-25 DIAGNOSIS — Z794 Long term (current) use of insulin: Secondary | ICD-10-CM | POA: Diagnosis not present

## 2022-04-25 DIAGNOSIS — Z79899 Other long term (current) drug therapy: Secondary | ICD-10-CM | POA: Diagnosis not present

## 2022-04-25 DIAGNOSIS — Z7982 Long term (current) use of aspirin: Secondary | ICD-10-CM | POA: Diagnosis not present

## 2022-04-25 DIAGNOSIS — F339 Major depressive disorder, recurrent, unspecified: Secondary | ICD-10-CM | POA: Diagnosis not present

## 2022-04-25 DIAGNOSIS — R45851 Suicidal ideations: Secondary | ICD-10-CM | POA: Diagnosis not present

## 2022-04-25 DIAGNOSIS — I10 Essential (primary) hypertension: Secondary | ICD-10-CM | POA: Diagnosis not present

## 2022-04-25 DIAGNOSIS — E119 Type 2 diabetes mellitus without complications: Secondary | ICD-10-CM | POA: Diagnosis not present

## 2022-04-25 NOTE — Telephone Encounter (Signed)
   CCM RN Visit Note   '@DATE'$ @ Name: Randy [name] MRN: [id number]      DOB: Mar 31, 1951  Subjective: Randy [name] is a 71 y.o. year old male who is a primary care patient of '@PCP'$ . The patient was referred to the Chronic Care Management team for assistance with care management needs subsequent to provider initiation of CCM services and plan of care.      Second unsuccessful telephone outreach was attempted today to contact the patient about Chronic Care Management needs.    Plan:Telephone follow up appointment with care management team member scheduled for:  upon care guide rescheduling.  Jacqlyn Larsen RNC, BSN RN Case Manager [place] [phone]

## 2022-04-28 ENCOUNTER — Telehealth: Payer: Self-pay | Admitting: Family Medicine

## 2022-04-28 DIAGNOSIS — F32A Depression, unspecified: Secondary | ICD-10-CM | POA: Diagnosis not present

## 2022-04-28 NOTE — Telephone Encounter (Signed)
Amy NOT in HIPAA   Attempted to contact patient- NVM

## 2022-04-28 NOTE — Telephone Encounter (Signed)
[employer] (employer of patient) called to make Dr Dettinger aware that patient needs his help immediately.  Says pt went to Urgent Care and was prescribed Hydroxyzine. Says he started having hallucinations when he started taking it so he went to the ER at [facility] and the doctor at the ER admitted him involuntary, stating that he was crazy and is now trying to have him committed at a psychiatric place.   Wants to know if there is anything Dr Dettinger can do to help? Says patient is not crazy. He is just having hallucinations from that medicine and needs to come off of it but no one will listen to patient or her.   Can contact Amy at [phone] for questions.

## 2022-04-28 NOTE — Telephone Encounter (Signed)
If he is in the ER then I do not know that I would necessarily change anything currently, with follow-up recommendations from them but do want him to come into [facility] as he gets out of the ER or hospital.  We can see if we can help at that point.  I am hoping they do some scans of his head and stuff.

## 2022-04-30 NOTE — Telephone Encounter (Signed)
Patient has hospital follow up scheduled for Monday

## 2022-05-05 ENCOUNTER — Encounter: Payer: Self-pay | Admitting: Family Medicine

## 2022-05-05 ENCOUNTER — Ambulatory Visit (INDEPENDENT_AMBULATORY_CARE_PROVIDER_SITE_OTHER): Payer: No Typology Code available for payment source | Admitting: Family Medicine

## 2022-05-05 VITALS — BP 138/68 | HR 93 | Temp 97.3°F | Ht 69.0 in | Wt 175.0 lb

## 2022-05-05 DIAGNOSIS — F411 Generalized anxiety disorder: Secondary | ICD-10-CM | POA: Diagnosis not present

## 2022-05-05 DIAGNOSIS — F339 Major depressive disorder, recurrent, unspecified: Secondary | ICD-10-CM | POA: Diagnosis not present

## 2022-05-05 MED ORDER — QUETIAPINE FUMARATE 50 MG PO TABS: 50.0000 mg | ORAL_TABLET | Freq: Every day | ORAL | 1 refills | Status: DC

## 2022-05-05 MED ORDER — HYDROXYZINE PAMOATE 50 MG PO CAPS: 50.0000 mg | ORAL_CAPSULE | Freq: Three times a day (TID) | ORAL | 1 refills | Status: DC | PRN

## 2022-05-05 MED ORDER — CITALOPRAM HYDROBROMIDE 40 MG PO TABS: 40.0000 mg | ORAL_TABLET | Freq: Every day | ORAL | 1 refills | Status: DC

## 2022-05-05 NOTE — Progress Notes (Signed)
BP 138/68   Pulse 93   Temp (!) 97.3 F (36.3 C)   Ht _0  (1.753 m)   Wt 175 lb (79.4 kg)   SpO2 99%   BMI 25.84 kg/m    Subjective:   Patient ID: Randy [name], male    DOB: 1950/10/07, 71 y.o.   MRN: [id number]  HPI: Randy [name] is a 71 y.o. male presenting on 05/05/2022 for Medical Management of Chronic Issues [facility] follow up)   HPI Depression and anxiety Patient is coming in for depression and anxiety.  He feels like he has been feeling worse with the depression and anxiety.  We talked about it last time but he does not really want to talk about what is making him feel this way or what thoughts he is having.  He says he does have thoughts of wishing it would and but has no suicidal plans.  He was in the emergency department and urgent care a few times over the past couple weeks.  On our last visit we discussed increasing the citalopram and we did increase it on 04/07/2022 but now he says he is cutting in half.  He went into the urgent care on 04/11/2022 and was prescribed hydroxyzine.  He was again in the urgent care on 04/21/2022 and then was in the emergency department on 04/25/2022.  He states that throughout all of this he has been having especially big issues with sleeping.  He says his mind will keep going with these worries and he cannot sleep.  He denies any plan of suicide, just wishes it would all end.    04/02/2022    8:24 AM 11/27/2021    8:12 AM 08/14/2021    8:16 AM 08/14/2021    8:15 AM 05/15/2021    8:34 AM  Depression screen PHQ 2/9  Decreased Interest 0 0  0 0  Down, Depressed, Hopeless 0 0  0 0  PHQ - 2 Score 0 0  0 0  Altered sleeping 0 0 0    Tired, decreased energy 0 0 0    Change in appetite 0 0 0    Feeling bad or failure about yourself  0 0 0    Trouble concentrating 0 0 0    Moving slowly or fidgety/restless 0 0 0    Suicidal thoughts 0 0 0    PHQ-9 Score 0 0     Difficult doing work/chores Not difficult at all Not difficult at all         Relevant past medical, surgical, family and social history reviewed and updated as indicated. Interim medical history since our last visit reviewed. Allergies and medications reviewed and updated.  Review of Systems  Constitutional:  Negative for chills and fever.  Eyes:  Negative for discharge.  Respiratory:  Negative for shortness of breath and wheezing.   Cardiovascular:  Negative for chest pain and leg swelling.  Musculoskeletal:  Negative for back pain and gait problem.  Skin:  Negative for rash.  Psychiatric/Behavioral:  Positive for decreased concentration, dysphoric mood and sleep disturbance. Negative for confusion, self-injury and suicidal ideas. The patient is nervous/anxious.   All other systems reviewed and are negative.   Per HPI unless specifically indicated above   Allergies as of 05/05/2022   No Known Allergies      Medication List        Accurate as of May 05, 2022  9:36 AM. If you have any questions, ask your nurse  or doctor.          Accu-Chek Guide test strip Generic drug: glucose blood Use as instructed to test blood sugar daily. DX: E11.65 (patient using guide meter now)   amLODipine 10 MG tablet Commonly known as: NORVASC Take 1 tablet (10 mg total) by mouth daily.   aspirin EC 81 MG tablet Take 81 mg by mouth daily.   B-D ULTRAFINE III SHORT PEN 31G X 8 MM Misc Generic drug: Insulin Pen Needle 1 each by Does not apply route as directed.   Basaglar KwikPen 100 UNIT/ML Inject 70 Units into the skin daily.   cilostazol 100 MG tablet Commonly known as: PLETAL Take 1 tablet (100 mg total) by mouth 2 (two) times daily.   citalopram 40 MG tablet Commonly known as: CELEXA Take 1 tablet (40 mg total) by mouth daily.   FreeStyle Libre 2 Sensor Misc Use to check blood sugar 4 times daily. Change sensor every 14 days. DX: E11.9   glucose monitoring kit monitoring kit 1 each by Does not apply route 2 (two) times daily before a meal.    hydrochlorothiazide 25 MG tablet Commonly known as: HYDRODIURIL Take 1 tablet (25 mg total) by mouth daily.   hydrOXYzine 50 MG capsule Commonly known as: VISTARIL Take 1 capsule (50 mg total) by mouth 3 (three) times daily as needed. Started by: Fransisca Kaufmann Josanna Hefel, MD   insulin lispro 100 UNIT/ML KwikPen Commonly known as: HumaLOG KwikPen Inject 10 Units into the skin 3 (three) times daily with meals.   lidocaine 5 % ointment Commonly known as: XYLOCAINE Apply 1 application topically 3 (three) times daily as needed.   losartan 100 MG tablet Commonly known as: COZAAR Take 1 tablet (100 mg total) by mouth daily.   metFORMIN 500 MG 24 hr tablet Commonly known as: Glucophage XR Take 2 tablets (1,000 mg total) by mouth in the morning and at bedtime.   Potassium Chloride ER 20 MEQ Tbcr Take 20 mEq by mouth daily.   pravastatin 80 MG tablet Commonly known as: PRAVACHOL Take 1 tablet (80 mg total) by mouth daily.   QUEtiapine 50 MG tablet Commonly known as: SEROquel Take 1 tablet (50 mg total) by mouth at bedtime. Started by: Worthy Rancher, MD   Trulicity 4.5 VP/7.1GG Sopn Generic drug: Dulaglutide Inject 4.5 mg as directed once a week. DX: E11.65         Objective:   BP 138/68   Pulse 93   Temp (!) 97.3 F (36.3 C)   Ht _0  (1.753 m)   Wt 175 lb (79.4 kg)   SpO2 99%   BMI 25.84 kg/m   Wt Readings from Last 3 Encounters:  05/05/22 175 lb (79.4 kg)  04/02/22 182 lb (82.6 kg)  11/27/21 181 lb (82.1 kg)    Physical Exam Vitals and nursing note reviewed.  Constitutional:      General: He is not in acute distress.    Appearance: He is well-developed. He is not diaphoretic.  Eyes:     General: No scleral icterus.       Right eye: No discharge.     Conjunctiva/sclera: Conjunctivae normal.     Pupils: Pupils are equal, round, and reactive to light.  Neck:     Thyroid: No thyromegaly.  Cardiovascular:     Rate and Rhythm: Normal rate and regular  rhythm.     Heart sounds: Normal heart sounds. No murmur heard. Pulmonary:     Effort: Pulmonary effort  is normal. No respiratory distress.     Breath sounds: Normal breath sounds. No wheezing.  Musculoskeletal:        General: No swelling. Normal range of motion.     Cervical back: Neck supple.  Lymphadenopathy:     Cervical: No cervical adenopathy.  Skin:    General: Skin is warm and dry.     Findings: No rash.  Neurological:     Mental Status: He is alert and oriented to person, place, and time.     Coordination: Coordination normal.  Psychiatric:        Mood and Affect: Mood is anxious and depressed. Affect is flat.        Behavior: Behavior normal.        Thought Content: Thought content does not include suicidal ideation. Thought content does not include suicidal plan.       Assessment & Plan:   Problem List Items Addressed This Visit       Other   Depression, recurrent ([place]) - Primary   Relevant Medications   citalopram (CELEXA) 40 MG tablet   hydrOXYzine (VISTARIL) 50 MG capsule   QUEtiapine (SEROQUEL) 50 MG tablet   Other Relevant Orders   Ambulatory referral to Psychiatry   GAD (generalized anxiety disorder)   Relevant Medications   citalopram (CELEXA) 40 MG tablet   hydrOXYzine (VISTARIL) 50 MG capsule   QUEtiapine (SEROQUEL) 50 MG tablet   Other Relevant Orders   Ambulatory referral to Psychiatry    Will have patient go up to the citalopram 40 mg, it seems like he has not done that yet, will also add Seroquel in the evenings and use hydroxyzine 3 times daily as needed.  Have also placed referral to psychiatry for the patient urgently. Follow up plan: Return in about 3 weeks (around 05/26/2022), or if symptoms worsen or fail to improve, for Depression and anxiety recheck.  Counseling provided for all of the vaccine components Orders Placed This Encounter  Procedures   Ambulatory referral to Psychiatry    Caryl Pina, MD [facility]  Family Medicine 05/05/2022, 9:36 AM

## 2022-05-07 ENCOUNTER — Ambulatory Visit: Payer: No Typology Code available for payment source | Admitting: Family Medicine

## 2022-05-09 ENCOUNTER — Telehealth: Payer: Self-pay | Admitting: Pharmacist

## 2022-05-09 ENCOUNTER — Ambulatory Visit (INDEPENDENT_AMBULATORY_CARE_PROVIDER_SITE_OTHER): Payer: No Typology Code available for payment source | Admitting: Pharmacist

## 2022-05-09 DIAGNOSIS — I70213 Atherosclerosis of native arteries of extremities with intermittent claudication, bilateral legs: Secondary | ICD-10-CM

## 2022-05-09 DIAGNOSIS — E1169 Type 2 diabetes mellitus with other specified complication: Secondary | ICD-10-CM

## 2022-05-09 DIAGNOSIS — Z794 Long term (current) use of insulin: Secondary | ICD-10-CM

## 2022-05-09 MED ORDER — INSULIN LISPRO (1 UNIT DIAL) 100 UNIT/ML (KWIKPEN): 10.0000 [IU] | PEN_INJECTOR | Freq: Three times a day (TID) | SUBCUTANEOUS | 11 refills | Status: DC

## 2022-05-09 MED ORDER — TRULICITY 4.5 MG/0.5ML ~~LOC~~ SOAJ: 4.5000 mg | SUBCUTANEOUS | 3 refills | Status: DC

## 2022-05-09 MED ORDER — BASAGLAR KWIKPEN 100 UNIT/ML ~~LOC~~ SOPN: 70.0000 [IU] | PEN_INJECTOR | Freq: Every day | SUBCUTANEOUS | 5 refills | Status: DC

## 2022-05-09 NOTE — Telephone Encounter (Signed)
Can we make sure patient re-enrolls for [employer] [employer]: Basaglar 70 units daily Trulicity 4.'5mg'$  weekly Humalog 10 units 3 times daily with meals  All have been escribed to [facility]  Thanks!

## 2022-05-09 NOTE — Progress Notes (Signed)
Chronic Care Management Pharmacy Note  05/09/2022 Name:  Randy [name] MRN:  [id number] DOB:  07-27-1950  Summary:   Diabetes: Uncontrolled (improved)--A1C 10.3-->9.0%; current treatment:BASAGLAR 60-70 UNITS DAILY, METFORMIN, TRULICITY 7.1HA--FBXUXY meal time insulin (Humalog 10-15 units with meals 3 times daily);  Patient admits to not taking meal time--highly encouraged for improved glycemic control and health outcomes Unable to tolerate farxiga due to frequent urination--may retrial when more controlled [place]  Phone: [phone] Escribed to [facility] mail order pharmacy (on behalf of [facility] patient assistance) Current glucose readings: fasting glucose: 145, post prandial glucose: N/A--PATIENT UNABLE TO CHECK AT HOME (METER ERRORS);  SUBMITTED LIBRE 2 CGM (DEVICE & SENSORS-69MONTH SUPPLY X1 YR REFILLS) TO DME COMPANY -->PATIENT'S INSURANCE REQUIRES 20% COPAY Denies hypoglycemic/hyperglycemic symptoms (DOESN'T CHECK BLOOD SUGAR AT HOME) Discussed meal planning options and Plate method for healthy eating Avoid sugary drinks and desserts Incorporate balanced protein, non starchy veggies, 1 serving of carbohydrate with each meal Increase water intake Increase physical activity as able Current exercise: N/A Recommended compliance with meal time humalog 10-15 units with meals 3 times daily, increase basaglar by 5-10 units Assessed patient finances. Re-enrollment submitted to the [facility] patient assistance program (trulicity, basaglar, humalog) for 2024  HYPERLIPIDEMIA: --continue current therapy --LDL Improved to 69 Lipid Panel     Component Value Date/Time   CHOL 134 11/27/2021 0813   TRIG 157 (H) 11/27/2021 0813   HDL 38 (L) 11/27/2021 0813   CHOLHDL 3.5 11/27/2021 0813   LDLCALC 69 11/27/2021 0813   LABVLDL 27 11/27/2021 0813     Patient Goals/Self-Care Activities Over the next 90 days, patient will:  - take  medications as prescribed check glucose using CGM, document, and provide at future appointments  Follow Up Plan: Telephone follow up appointment with care management team member scheduled for: 3 months   Subjective: Randy [name] is an 71 y.o. year old male who is a primary patient of Dettinger, Fransisca Kaufmann, MD.  The patient was referred to the Chronic Care Management team for assistance with care management needs subsequent to provider initiation of CCM services and plan of care.    Engaged with patient by telephone for follow up visit in response to provider referral for CCM services.   Objective:  LABS:   Lab Results  Component Value Date   CREATININE 1.03 11/27/2021   CREATININE 1.03 05/15/2021   CREATININE 1.08 10/31/2020     Lab Results  Component Value Date   HGBA1C 9.0 (H) 04/02/2022         Component Value Date/Time   CHOL 134 11/27/2021 0813   TRIG 157 (H) 11/27/2021 0813   HDL 38 (L) 11/27/2021 0813   CHOLHDL 3.5 11/27/2021 0813   LDLCALC 69 11/27/2021 0813     Clinical ASCVD: No   The 10-year ASCVD risk score (Arnett DK, et al., 2019) is: 37.8%   Values used to calculate the score:     Age: 61 years     Sex: Male     Is Non-Hispanic African American: No     Diabetic: Yes     Tobacco smoker: No     Systolic Blood Pressure: 606 mmHg     Is BP treated: Yes     HDL Cholesterol: 38 mg/dL     Total Cholesterol: 134 mg/dL    Other: (CHADS2VASc if Afib, PHQ9 if depression, MMRC or CAT for COPD, ACT, DEXA)    BP Readings from  Last 3 Encounters:  05/05/22 138/68  04/02/22 (!) 146/75  11/27/21 129/65      SDOH:  (Social Determinants of Health) assessments and interventions performed:    No Known Allergies  Medications Reviewed Today     Reviewed by Lavera Guise, [facility] (Pharmacist) on 05/09/22 at 1034  Med List Status: <None>   Medication Order Taking? Sig Documenting Provider Last Dose Status Informant  amLODipine (NORVASC) 10 MG tablet [id number] No  Take 1 tablet (10 mg total) by mouth daily. Dettinger, Fransisca Kaufmann, MD Taking Active   aspirin EC 81 MG tablet [id number] No Take 81 mg by mouth daily. [provider] Taking Active Self  cilostazol (PLETAL) 100 MG tablet [id number] No Take 1 tablet (100 mg total) by mouth 2 (two) times daily. Dettinger, Fransisca Kaufmann, MD Taking Active   citalopram (CELEXA) 40 MG tablet [id number]  Take 1 tablet (40 mg total) by mouth daily. Dettinger, Fransisca Kaufmann, MD  Active   Continuous Blood Gluc Sensor (FREESTYLE LIBRE 2 SENSOR) [place] [id number] No Use to check blood sugar 4 times daily. Change sensor every 14 days. DX: E11.9 Dettinger, Fransisca Kaufmann, MD Taking Active   Dulaglutide (TRULICITY) 4.5 HL/4.5GY SOPN [id number]  Inject 4.5 mg as directed once a week. DX: E11.65 Dettinger, Fransisca Kaufmann, MD  Active   glucose blood (ACCU-CHEK GUIDE) test strip [id number] No Use as instructed to test blood sugar daily. DX: E11.65 (patient using guide meter now) Dettinger, Fransisca Kaufmann, MD Taking Active   glucose monitoring kit (FREESTYLE) monitoring kit [id number] No 1 each by Does not apply route 2 (two) times daily before a meal. Dettinger, Fransisca Kaufmann, MD Taking Active   hydrochlorothiazide (HYDRODIURIL) 25 MG tablet [id number] No Take 1 tablet (25 mg total) by mouth daily. Dettinger, Fransisca Kaufmann, MD Taking Active   hydrOXYzine (VISTARIL) 50 MG capsule [id number]  Take 1 capsule (50 mg total) by mouth 3 (three) times daily as needed. Dettinger, Fransisca Kaufmann, MD  Active   Insulin Glargine [facility] KWIKPEN) 100 UNIT/ML [id number]  Inject 70 Units into the skin daily. Dettinger, Fransisca Kaufmann, MD  Active   insulin lispro (HUMALOG KWIKPEN) 100 UNIT/ML KwikPen [id number]  Inject 10 Units into the skin 3 (three) times daily with meals. Dettinger, Fransisca Kaufmann, MD  Active   Insulin Pen Needle (B-D ULTRAFINE III SHORT PEN) 31G X 8 MM MISC [id number] No 1 each by Does not apply route as directed. Dettinger, Fransisca Kaufmann, MD Taking Active   lidocaine (XYLOCAINE) 5 % ointment  [id number] No Apply 1 application topically 3 (three) times daily as needed. Dettinger, Fransisca Kaufmann, MD Taking Active   losartan (COZAAR) 100 MG tablet [id number] No Take 1 tablet (100 mg total) by mouth daily. Dettinger, Fransisca Kaufmann, MD Taking Active   metFORMIN (GLUCOPHAGE XR) 500 MG 24 hr tablet [id number] No Take 2 tablets (1,000 mg total) by mouth in the morning and at bedtime. Dettinger, Fransisca Kaufmann, MD Taking Active   Potassium Chloride ER 20 MEQ TBCR [id number] No Take 20 mEq by mouth daily.  [provider] Taking Active Self  pravastatin (PRAVACHOL) 80 MG tablet [id number] No Take 1 tablet (80 mg total) by mouth daily. Dettinger, Fransisca Kaufmann, MD Taking Active   QUEtiapine (SEROQUEL) 50 MG tablet [id number]  Take 1 tablet (50 mg total) by mouth at bedtime. Dettinger, Fransisca Kaufmann, MD  Active               Goals Addressed   None     Plan: Telephone  follow up appointment with care management team member scheduled for:  3 months     Regina Eck, PharmD, BCPS, [place] Clinical Pharmacist, [place]  [place]  T [phone]

## 2022-05-09 NOTE — Patient Instructions (Signed)
Visit Information  Following are the goals we discussed today:  Current Barriers:  Unable to independently afford treatment regimen Unable to achieve control of T2DM   Pharmacist Clinical Goal(s):  Over the next 90 days, patient will verbalize ability to afford treatment regimen achieve control of T2DM as evidenced by IMPROVED GLYCEMIC CONTROL, GOAL A1C <7% through collaboration with PharmD and provider.   Interventions: 1:1 collaboration with Dettinger, Fransisca Kaufmann, MD regarding development and update of comprehensive plan of care as evidenced by provider attestation and co-signature Inter-disciplinary care team collaboration (see longitudinal plan of care) Comprehensive medication review performed; medication list updated in electronic medical record  Diabetes: Uncontrolled (improved)--A1C 10.3-->9.0%; current treatment:BASAGLAR 60-70 UNITS DAILY, METFORMIN, TRULICITY 4.'5MG'$ --adding meal time insulin (Humalog 10-15 units with meals 3 times daily);  Patient admits to not taking meal time--highly encouraged for improved glycemic control and health outcomes Unable to tolerate farxiga due to frequent urination--may retrial when more controlled [place]  Phone: [phone] Escribed to [facility] mail order pharmacy (on behalf of [employer] [employer] patient assistance) Current glucose readings: fasting glucose: 145, post prandial glucose: N/A--PATIENT UNABLE TO CHECK AT HOME (METER ERRORS);  SUBMITTED LIBRE 2 CGM (DEVICE & SENSORS-80MONTH SUPPLY X1 YR REFILLS) TO DME COMPANY -->PATIENT'S INSURANCE REQUIRES 20% COPAY Denies hypoglycemic/hyperglycemic symptoms (DOESN'T CHECK BLOOD SUGAR AT HOME) Discussed meal planning options and Plate method for healthy eating Avoid sugary drinks and desserts Incorporate balanced protein, non starchy veggies, 1 serving of carbohydrate with each meal Increase water intake Increase physical activity as able Current  exercise: N/A Recommended compliance with meal time humalog 10-15 units with meals 3 times daily, increase basaglar by 5-10 units Assessed patient finances. Re-enrollment submitted to the [employer] patient assistance program (trulicity, basaglar, humalog) for 2024  HYPERLIPIDEMIA: --continue current therapy --LDL Improved to 69 Lipid Panel     Component Value Date/Time   CHOL 134 11/27/2021 0813   TRIG 157 (H) 11/27/2021 0813   HDL 38 (L) 11/27/2021 0813   CHOLHDL 3.5 11/27/2021 0813   LDLCALC 69 11/27/2021 0813   LABVLDL 27 11/27/2021 0813     Patient Goals/Self-Care Activities Over the next 90 days, patient will:  - take medications as prescribed check glucose using CGM, document, and provide at future appointments  Follow Up Plan: Telephone follow up appointment with care management team member scheduled for: 3 months   Plan: Telephone follow up appointment with care management team member scheduled for:  39month  Signature JRegina Eck PharmD, BCPS, BCACP Clinical Pharmacist, [place]  T [phone] Please call the care guide team at [phone] you need to cancel or reschedule your appointment.   The patient verbalized understanding of instructions, educational materials, and care plan provided today and DECLINED offer to receive copy of patient instructions, educational materials, and care plan.

## 2022-05-19 ENCOUNTER — Ambulatory Visit: Payer: No Typology Code available for payment source | Admitting: *Deleted

## 2022-05-19 DIAGNOSIS — I10 Essential (primary) hypertension: Secondary | ICD-10-CM

## 2022-05-19 DIAGNOSIS — Z794 Long term (current) use of insulin: Secondary | ICD-10-CM

## 2022-05-19 NOTE — Chronic Care Management (AMB) (Signed)
Chronic Care Management   CCM RN Visit Note  05/19/2022 Name: Randy [name] MRN: [id number] DOB: Sep 10, 1950  Subjective: Randy [name] is a 71 y.o. year old male who is a primary care patient of Randy [name], Randy [name], Randy [name]. The patient was referred to the Chronic Care Management team for assistance with care management needs subsequent to provider initiation of CCM services and plan of care.    Today's Visit:  Engaged with patient by telephone for initial visit.     SDOH Interventions Today    Flowsheet Row Most Recent Value  SDOH Interventions   Food Insecurity Interventions Intervention Not Indicated  Housing Interventions Intervention Not Indicated  Transportation Interventions Intervention Not Indicated  Utilities Interventions Intervention Not Indicated  Financial Strain Interventions Intervention Not Indicated  Physical Activity Interventions Patient Refused  Stress Interventions Patient Refused  Social Connections Interventions Intervention Not Indicated         Goals Addressed             This Visit's Progress    CCM (DIABETES) EXPECTED OUTCOME:  MONITOR, SELF-MANAGE AND REDUCE SYMPTOMS OF DIABETES       Current Barriers:  Knowledge Deficits related to Diabetes management Chronic Disease Management support and education needs related to Diabetes and diet No Advanced Directives in place- documents being mailed Patient reports he checks CBG once daily with readings fasting 106-163 with today's reading 163 (pt no longer uses [employer]).  Pt reports he does not follow a special diet, drinks diet soft drinks Patient does not exercise, used to swim at [name] and may start back Patient reports he has enough Basaglar insulin to last 8-10 days and requests message be sent to Randy [name] [name]  Planned Interventions: Provided education to patient about basic DM disease process; Reviewed medications with patient and discussed importance of medication adherence;         Reviewed prescribed diet with patient carbohydrate modified; Counseled on importance of regular laboratory monitoring as prescribed;        Discussed plans with patient for ongoing care management follow up and provided patient with direct contact information for care management team;      Provided patient with written educational materials related to hypo and hyperglycemia and importance of correct treatment;       Reviewed scheduled/upcoming provider appointments including: primary care provider 05/26/22;         Review of patient status, including review of consultants reports, relevant laboratory and other test results, and medications completed;       Screening for signs and symptoms of depression related to chronic disease state;        Assessed social determinant of health barriers;        Message sent to Randy [name] reporting pt has 8-10 days of Basaglar insulin left  Symptom Management: Take medications as prescribed   Attend all scheduled provider appointments Call pharmacy for medication refills 3-7 days in advance of running out of medications Attend church or other social activities Perform all self care activities independently  Perform IADL's (shopping, preparing meals, housekeeping, managing finances) independently Call provider office for new concerns or questions  check blood sugar at prescribed times: once daily check feet daily for cuts, sores or redness enter blood sugar readings and medication or insulin into daily log take the blood sugar log to all doctor visits take the blood sugar meter to all doctor visits trim toenails straight across fill half of plate with vegetables limit fast food  meals to no more than 1 per week manage portion size read food labels for fat, fiber, carbohydrates and portion size wash and dry feet carefully every day wear comfortable, cotton socks wear comfortable, well-fitting shoes Message sent to [name] that you have  8-10 days of Basaglar insulin left Look over education mailed- hypoglycemia  Follow Up Plan: Telephone follow up appointment with care management team member scheduled for:  06/30/22 at 215 pm       CCM (HYPERTENSION) EXPECTED OUTCOME: MONITOR, SELF-MANAGE AND REDUCE SYMPTOMS OF HYPERTENSION       Current Barriers:  Knowledge Deficits related to Hypertension management Chronic Disease Management support and education needs related to Hypertension, diet and exercise No Advanced Directives in place- pt requests documents be mailed Patient reports he lives alone, has nephew that lives close by he can call on if needed, works part time at [employer] and has people there that assist him if needed. Patient reports he checks blood pressure on occasion with recent reading 140/70 Patient states he decided he did not want to talk with psychiatrist (referral was [name]) Patient requests that RN care manager speak with his co-worker Randy [name] about medication.  Per Randy, pt cannot take hydroxyzine due to side effects and states "this is what sent him to the [name], being on this medicine"  States pt is not taking seroquel because he feels "groggy", pt is using melatonin for sleep.   Planned Interventions: Evaluation of current treatment plan related to hypertension self management and patient's adherence to plan as established by provider;   Reviewed prescribed diet low sodium Reviewed medications with patient and discussed importance of compliance;  Counseled on the importance of exercise goals with target of 150 minutes per week Discussed plans with patient for ongoing care management follow up and provided patient with direct contact information for care management team; Advised patient, providing education and rationale, to monitor blood pressure daily and record, calling PCP for findings outside established parameters;  Reviewed scheduled/upcoming provider appointments including:  Advised  patient to discuss medication side effects with provider; Provided education on prescribed diet low sodium;  Discussed complications of poorly controlled blood pressure such as heart disease, stroke, circulatory complications, vision complications, kidney impairment, sexual dysfunction;  Screening for signs and symptoms of depression related to chronic disease state;  Assessed social determinant of health barriers;  Advanced directives mailed In basket message sent to primary care provider and Randy [name] [name] reporting pt declined psychiatry referral, is not taking seroquel due to feeling groggy, is not taking hydroxyzine due to side effects  Symptom Management: Take medications as prescribed   Attend all scheduled provider appointments Call pharmacy for medication refills 3-7 days in advance of running out of medications Attend church or other social activities Perform all self care activities independently  Perform IADL's (shopping, preparing meals, housekeeping, managing finances) independently Call provider office for new concerns or questions  check blood pressure weekly choose a place to take my blood pressure (home, clinic or office, retail store) write blood pressure results in a log or diary learn about high blood pressure keep a blood pressure log take blood pressure log to all doctor appointments keep all doctor appointments take medications for blood pressure exactly as prescribed begin an exercise program report new symptoms to your doctor eat more whole grains, fruits and vegetables, lean meats and healthy fats Look over education mailed- low sodium diet Please talk to your doctor if you are having any unwanted side effects of  medication Advanced directives packet mailed  Follow Up Plan: Telephone follow up appointment with care management team member scheduled for: 06/30/22 at 215 pm          Plan:Telephone follow up appointment with care management team member  scheduled for:  06/30/22 at 215 pm  Jacqlyn Larsen [name], BSN RN Case Manager [place] [phone]

## 2022-05-19 NOTE — Patient Instructions (Signed)
Low-Sodium Eating Plan Sodium, which is an element that makes up salt, helps you maintain a healthy balance of fluids in your body. Too much sodium can increase your blood pressure and cause fluid and waste to be held in your body. Your health care provider or dietitian may recommend following this plan if you have high blood pressure (hypertension), kidney disease, liver disease, or heart failure. Eating less sodium can help lower your blood pressure, reduce swelling, and protect your heart, liver, and kidneys. What are tips for following this plan? Reading food labels The Nutrition Facts label lists the amount of sodium in one serving of the food. If you eat more than one serving, you must multiply the listed amount of sodium by the number of servings. Choose foods with less than 140 mg of sodium per serving. Avoid foods with 300 mg of sodium or more per serving. Shopping  Look for lower-sodium products, often labeled as "low-sodium" or "no salt added." Always check the sodium content, even if foods are labeled as "unsalted" or "no salt added." Buy fresh foods. Avoid canned foods and pre-made or frozen meals. Avoid canned, cured, or processed meats. Buy breads that have less than 80 mg of sodium per slice. Cooking  Eat more home-cooked food and less restaurant, buffet, and fast food. Avoid adding salt when cooking. Use salt-free seasonings or herbs instead of table salt or sea salt. Check with your health care provider or pharmacist before using salt substitutes. Cook with plant-based oils, such as canola, sunflower, or olive oil. Meal planning When eating at a restaurant, ask that your food be prepared with less salt or no salt, if possible. Avoid dishes labeled as brined, pickled, cured, smoked, or made with soy sauce, miso, or teriyaki sauce. Avoid foods that contain MSG (monosodium glutamate). MSG is sometimes added to [place] food, bouillon, and some canned foods. Make meals that can  be grilled, baked, poached, roasted, or steamed. These are generally made with less sodium. General information Most people on this plan should limit their sodium intake to 1,500-2,000 mg (milligrams) of sodium each day. What foods should I eat? Fruits Fresh, frozen, or canned fruit. Fruit juice. Vegetables Fresh or frozen vegetables. "No salt added" canned vegetables. "No salt added" tomato sauce and paste. Low-sodium or reduced-sodium tomato and vegetable juice. Grains Low-sodium cereals, including oats, puffed wheat and rice, and shredded wheat. Low-sodium crackers. Unsalted rice. Unsalted pasta. Low-sodium bread. Whole-grain breads and whole-grain pasta. Meats and other proteins Fresh or frozen (no salt added) meat, poultry, seafood, and fish. Low-sodium canned tuna and salmon. Unsalted nuts. Dried peas, beans, and lentils without added salt. Unsalted canned beans. Eggs. Unsalted nut butters. Dairy Milk. Soy milk. Cheese that is naturally low in sodium, such as ricotta cheese, fresh mozzarella, or Swiss cheese. Low-sodium or reduced-sodium cheese. Cream cheese. Yogurt. Seasonings and condiments Fresh and dried herbs and spices. Salt-free seasonings. Low-sodium mustard and ketchup. Sodium-free salad dressing. Sodium-free light mayonnaise. Fresh or refrigerated horseradish. Lemon juice. Vinegar. Other foods Homemade, reduced-sodium, or low-sodium soups. Unsalted popcorn and pretzels. Low-salt or salt-free chips. The items listed above may not be a complete list of foods and beverages you can eat. Contact a dietitian for more information. What foods should I avoid? Vegetables Sauerkraut, pickled vegetables, and relishes. Olives. [place] fries. Onion rings. Regular canned vegetables (not low-sodium or reduced-sodium). Regular canned tomato sauce and paste (not low-sodium or reduced-sodium). Regular tomato and vegetable juice (not low-sodium or reduced-sodium). Frozen vegetables in  sauces. Grains  Instant hot cereals. Bread stuffing, pancake, and biscuit mixes. Croutons. Seasoned rice or pasta mixes. Noodle soup cups. Boxed or frozen macaroni and cheese. Regular salted crackers. Self-rising flour. Meats and other proteins Meat or fish that is salted, canned, smoked, spiced, or pickled. Precooked or cured meat, such as sausages or meat loaves. Berniece Salines. Ham. Pepperoni. Hot dogs. Corned beef. Chipped beef. Salt pork. Jerky. Pickled herring. Anchovies and sardines. Regular canned tuna. Salted nuts. Dairy Processed cheese and cheese spreads. Hard cheeses. Cheese curds. Blue cheese. Feta cheese. String cheese. Regular cottage cheese. Buttermilk. Canned milk. Fats and oils Salted butter. Regular margarine. Ghee. Bacon fat. Seasonings and condiments Onion salt, garlic salt, seasoned salt, table salt, and sea salt. Canned and packaged gravies. Worcestershire sauce. Tartar sauce. Barbecue sauce. Teriyaki sauce. Soy sauce, including reduced-sodium. Steak sauce. Fish sauce. Oyster sauce. Cocktail sauce. Horseradish that you find on the shelf. Regular ketchup and mustard. Meat flavorings and tenderizers. Bouillon cubes. Hot sauce. Pre-made or packaged marinades. Pre-made or packaged taco seasonings. Relishes. Regular salad dressings. Salsa. Other foods Salted popcorn and pretzels. Corn chips and puffs. Potato and tortilla chips. Canned or dried soups. Pizza. Frozen entrees and pot pies. The items listed above may not be a complete list of foods and beverages you should avoid. Contact a dietitian for more information. Summary Eating less sodium can help lower your blood pressure, reduce swelling, and protect your heart, liver, and kidneys. Most people on this plan should limit their sodium intake to 1,500-2,000 mg (milligrams) of sodium each day. Canned, boxed, and frozen foods are high in sodium. Restaurant foods, fast foods, and pizza are also very high in sodium. You also get sodium by  adding salt to food. Try to cook at home, eat more fresh fruits and vegetables, and eat less fast food and canned, processed, or prepared foods. This information is not intended to replace advice given to you by your health care provider. Make sure you discuss any questions you have with your health care provider. Document Revised: 07/01/2019 Document Reviewed: 04/27/2019 Elsevier Patient Education  [place]. Please call the care guide team at [phone] if you need to cancel or reschedule your appointment.   If you are experiencing a Mental Health or [place] or need someone to talk to, please call the Suicide and Crisis Lifeline: 988 call the [place] National Suicide Prevention Lifeline: 803-594-0540 or TTY: 606-226-1983 TTY 819 079 0670) to talk to a trained counselor call 1-800-273-TALK (toll free, 24 hour hotline) go to [facility] [place] [phone]) call the [facility]: [phone] call 911   Following is a copy of your full provider care plan:   Goals Addressed             This Visit's Progress    CCM (DIABETES) EXPECTED OUTCOME:  MONITOR, SELF-MANAGE AND REDUCE SYMPTOMS OF DIABETES       Current Barriers:  Knowledge Deficits related to Diabetes management Chronic Disease Management support and education needs related to Diabetes and diet No Advanced Directives in place- documents being mailed Patient reports he checks CBG once daily with readings fasting 106-163 with today's reading 163 (pt no longer uses Freestyle [employer]).  Pt reports he does not follow a special diet, drinks diet soft drinks Patient does not exercise, used to swim at [facility] and may start back Patient reports he has enough Basaglar insulin to last 8-10 days and requests message be sent to [facility] pharmacist  Planned  Interventions: Provided education to patient about basic DM disease process; Reviewed  medications with patient and discussed importance of medication adherence;        Reviewed prescribed diet with patient carbohydrate modified; Counseled on importance of regular laboratory monitoring as prescribed;        Discussed plans with patient for ongoing care management follow up and provided patient with direct contact information for care management team;      Provided patient with written educational materials related to hypo and hyperglycemia and importance of correct treatment;       Reviewed scheduled/upcoming provider appointments including: primary care provider 05/26/22;         Review of patient status, including review of consultants reports, relevant laboratory and other test results, and medications completed;       Screening for signs and symptoms of depression related to chronic disease state;        Assessed social determinant of health barriers;        Message sent to [facility] pharmacist reporting pt has 8-10 days of Basaglar insulin left  Symptom Management: Take medications as prescribed   Attend all scheduled provider appointments Call pharmacy for medication refills 3-7 days in advance of running out of medications Attend church or other social activities Perform all self care activities independently  Perform IADL's (shopping, preparing meals, housekeeping, managing finances) independently Call provider office for new concerns or questions  check blood sugar at prescribed times: once daily check feet daily for cuts, sores or redness enter blood sugar readings and medication or insulin into daily log take the blood sugar log to all doctor visits take the blood sugar meter to all doctor visits trim toenails straight across fill half of plate with vegetables limit fast food meals to no more than 1 per week manage portion size read food labels for fat, fiber, carbohydrates and portion size wash and dry feet carefully every day wear comfortable, cotton  socks wear comfortable, well-fitting shoes Message sent to pharmacist that you have 8-10 days of Basaglar insulin left Look over education mailed- hypoglycemia  Follow Up Plan: Telephone follow up appointment with care management team member scheduled for:  06/30/22 at 215 pm       CCM (HYPERTENSION) EXPECTED OUTCOME: MONITOR, SELF-MANAGE AND REDUCE SYMPTOMS OF HYPERTENSION       Current Barriers:  Knowledge Deficits related to Hypertension management Chronic Disease Management support and education needs related to Hypertension, diet and exercise No Advanced Directives in place- pt requests documents be mailed Patient reports he lives alone, has nephew that lives close by he can call on if needed, works part time at [employer] and has people there that assist him if needed. Patient reports he checks blood pressure on occasion with recent reading 140/70 Patient states he decided he did not want to talk with psychiatrist (referral was done) Patient requests that RN care manager speak with his co-worker Amy Hassell Done about medication.  Per Amy, pt cannot take hydroxyzine due to side effects and states "this is what sent him to the hospital, being on this medicine"  States pt is not taking seroquel because he feels "groggy", pt is using melatonin for sleep.   Planned Interventions: Evaluation of current treatment plan related to hypertension self management and patient's adherence to plan as established by provider;   Reviewed prescribed diet low sodium Reviewed medications with patient and discussed importance of compliance;  Counseled on the importance of exercise goals with target of 150  minutes per week Discussed plans with patient for ongoing care management follow up and provided patient with direct contact information for care management team; Advised patient, providing education and rationale, to monitor blood pressure daily and record, calling PCP for findings outside established  parameters;  Reviewed scheduled/upcoming provider appointments including:  Advised patient to discuss medication side effects with provider; Provided education on prescribed diet low sodium;  Discussed complications of poorly controlled blood pressure such as heart disease, stroke, circulatory complications, vision complications, kidney impairment, sexual dysfunction;  Screening for signs and symptoms of depression related to chronic disease state;  Assessed social determinant of health barriers;  Advanced directives mailed In basket message sent to primary care provider and [facility] pharmacist reporting pt declined psychiatry referral, is not taking seroquel due to feeling groggy, is not taking hydroxyzine due to side effects  Symptom Management: Take medications as prescribed   Attend all scheduled provider appointments Call pharmacy for medication refills 3-7 days in advance of running out of medications Attend church or other social activities Perform all self care activities independently  Perform IADL's (shopping, preparing meals, housekeeping, managing finances) independently Call provider office for new concerns or questions  check blood pressure weekly choose a place to take my blood pressure (home, clinic or office, retail store) write blood pressure results in a log or diary learn about high blood pressure keep a blood pressure log take blood pressure log to all doctor appointments keep all doctor appointments take medications for blood pressure exactly as prescribed begin an exercise program report new symptoms to your doctor eat more whole grains, fruits and vegetables, lean meats and healthy fats Look over education mailed- low sodium diet Please talk to your doctor if you are having any unwanted side effects of medication Advanced directives packet mailed  Follow Up Plan: Telephone follow up appointment with care management team member scheduled for: 06/30/22 at 215 pm           The patient verbalized understanding of instructions, educational materials, and care plan provided today and agreed to receive a mailed copy of patient instructions, educational materials, and care plan.   Telephone follow up appointment with care management team member scheduled for:  06/30/22 at 215 pm  Hypoglycemia Hypoglycemia occurs when the level of sugar (glucose) in the blood is too low. Hypoglycemia can happen in people who have or do not have diabetes. It can develop quickly, and it can be a medical emergency. For most people, a blood glucose level below 70 mg/dL (3.9 mmol/L) is considered hypoglycemia. Glucose is a type of sugar that provides the body's main source of energy. Certain hormones (insulin and glucagon) control the level of glucose in the blood. Insulin lowers blood glucose, and glucagon raises blood glucose. Hypoglycemia can result from having too much insulin in the bloodstream, or from not eating enough food that contains glucose. You may also have reactive hypoglycemia, which happens within 4 hours after eating a meal. What are the causes? Hypoglycemia occurs most often in people who have diabetes and may be caused by: Diabetes medicine. Not eating enough, or not eating often enough. Increased physical activity. Drinking alcohol on an empty stomach. If you do not have diabetes, hypoglycemia may be caused by: A tumor in the pancreas. Not eating enough, or not eating for long periods at a time (fasting). A severe infection or illness. Problems after having bariatric surgery. Organ failure, such as kidney or liver failure. Certain medicines. What increases the risk? Hypoglycemia  is more likely to develop in people who: Have diabetes and take medicines to lower blood glucose. Abuse alcohol. Have a severe illness. What are the signs or symptoms? Symptoms vary depending on whether the condition is mild, moderate, or severe. Mild  hypoglycemia Hunger. Sweating and feeling clammy. Dizziness or feeling light-headed. Sleepiness or restless sleep. Nausea. Increased heart rate. Headache. Blurry vision. Mood changes, such as irritability or anxiety. Tingling or numbness around the mouth, lips, or tongue. Moderate hypoglycemia Confusion and poor judgment. Behavior changes. Weakness. Irregular heartbeat. A change in coordination. Severe hypoglycemia Severe hypoglycemia is a medical emergency. It can cause: Fainting. Seizures. Loss of consciousness (coma). Death. How is this diagnosed? Hypoglycemia is diagnosed with a blood test to measure your blood glucose level. This blood test is done while you are having symptoms. Your health care provider may also do a physical exam and review your medical history. How is this treated? This condition can be treated by immediately eating or drinking something that contains sugar with 15 grams of fast-acting carbohydrate, such as: 4 oz (120 mL) of fruit juice. 4 oz (120 mL) of regular soda (not diet soda). Several pieces of hard candy. Check food labels to find out how many pieces to eat for 15 grams. 1 Tbsp (15 mL) of sugar or honey. 4 glucose tablets. 1 tube of glucose gel. Treating hypoglycemia if you have diabetes If you are alert and able to swallow safely, follow the 15:15 rule: Take 15 grams of a fast-acting carbohydrate. Talk with your health care provider about how much you should take. Options for getting 15 grams of fast-acting carbohydrate include: Glucose tablets (take 4 tablets). Several pieces of hard candy. Check food labels to find out how many pieces to eat for 15 grams. 4 oz (120 mL) of fruit juice. 4 oz (120 mL) of regular soda (not diet soda). 1 Tbsp (15 mL) of sugar or honey. 1 tube of glucose gel. Check your blood glucose 15 minutes after you take the carbohydrate. If the repeat blood glucose level is still at or below 70 mg/dL (3.9 mmol/L), take 15  grams of a carbohydrate again. If your blood glucose level does not increase above 70 mg/dL (3.9 mmol/L) after 3 tries, seek emergency medical care. After your blood glucose level returns to normal, eat a meal or a snack within 1 hour.  Treating severe hypoglycemia Severe hypoglycemia is when your blood glucose level is below 54 mg/dL (3 mmol/L). Severe hypoglycemia is a medical emergency. Get medical help right away. If you have severe hypoglycemia and you cannot eat or drink, you will need to be given glucagon. A family member or close friend should learn how to check your blood glucose and how to give you glucagon. Ask your health care provider if you need to have an emergency glucagon kit available. Severe hypoglycemia may need to be treated in a hospital. The treatment may include getting glucose through an IV. You may also need treatment for the cause of your hypoglycemia. Follow these instructions at home:  General instructions Take over-the-counter and prescription medicines only as told by your health care provider. Monitor your blood glucose as told by your health care provider. If you drink alcohol: Limit how much you have to: 0-1 drink a day for women who are not pregnant. 0-2 drinks a day for men. Know how much alcohol is in your drink. In the [place] one drink equals one 12 oz bottle of beer (355 mL), one 5 oz  glass of wine (148 mL), or one 1 oz glass of hard liquor (44 mL). Be sure to eat food along with drinking alcohol. Be aware that alcohol is absorbed quickly and may have lingering effects that may result in hypoglycemia later. Be sure to do ongoing glucose monitoring. Keep all follow-up visits. This is important. If you have diabetes: Always have a fast-acting carbohydrate (15 grams) option with you to treat low blood glucose. Follow your diabetes management plan as directed by your health care provider. Make sure you: Know the symptoms of hypoglycemia. It is important to  treat it right away to prevent it from becoming severe. Check your blood glucose as often as told. Always check before and after exercise. Always check your blood glucose before you drive a motorized vehicle. Take your medicines as told. Follow your meal plan. Eat on time, and do not skip meals. Share your diabetes management plan with people in your workplace, school, and household. Carry a medical alert card or wear medical alert jewelry. Where to find more information American Diabetes Association: www.diabetes.org Contact a health care provider if: You have problems keeping your blood glucose in your target range. You have frequent episodes of hypoglycemia. Get help right away if: You continue to have hypoglycemia symptoms after eating or drinking something that contains 15 grams of fast-acting carbohydrate, and you cannot get your blood glucose above 70 mg/dL (3.9 mmol/L) while following the 15:15 rule. Your blood glucose is below 54 mg/dL (3 mmol/L). You have a seizure. You faint. These symptoms may represent a serious problem that is an emergency. Do not wait to see if the symptoms will go away. Get medical help right away. Call your local emergency services (911 in the [place] Do not drive yourself to the hospital. Summary Hypoglycemia occurs when the level of sugar (glucose) in the blood is too low. Hypoglycemia can happen in people who have or do not have diabetes. It can develop quickly, and it can be a medical emergency. Make sure you know the symptoms of hypoglycemia and how to treat it. Always have a fast-acting carbohydrate option with you to treat low blood sugar. This information is not intended to replace advice given to you by your health care provider. Make sure you discuss any questions you have with your health care provider. Document Revised: 04/26/2020 Document Reviewed: 04/26/2020 Elsevier Patient Education  [place].

## 2022-05-19 NOTE — Plan of Care (Signed)
Chronic Care Management Provider Comprehensive Care Plan    05/19/2022 Name: Randy [name] MRN: [id number] DOB: 24-Jan-1951  Referral to Chronic Care Management (CCM) services was placed by Provider:  Caryl Pina MD on Date: 04/02/22.  Chronic Condition 1: HYPERTENSION Provider Assessment and Plan   Essential hypertension, benign     Relevant Medications    amLODipine (NORVASC) 10 MG tablet    hydrochlorothiazide (HYDRODIURIL) 25 MG tablet    losartan (COZAAR) 100 MG tablet    Other Relevant Orders    Microalbumin / creatinine urine ratio    AMB Referral to Chronic Care Management Services      Expected Outcome/Goals Addressed This Visit (Provider CCM goals/Provider Assessment and plan  CCM (HYPERTENSION) EXPECTED OUTCOME: MONITOR, SELF-MANAGE AND REDUCE SYMPTOMS OF HYPERTENSION  Symptom Management Condition 1: Take medications as prescribed   Attend all scheduled provider appointments Call pharmacy for medication refills 3-7 days in advance of running out of medications Attend church or other social activities Perform all self care activities independently  Perform IADL's (shopping, preparing meals, housekeeping, managing finances) independently Call provider office for new concerns or questions  check blood pressure weekly choose a place to take my blood pressure (home, clinic or office, retail store) write blood pressure results in a log or diary learn about high blood pressure keep a blood pressure log take blood pressure log to all doctor appointments keep all doctor appointments take medications for blood pressure exactly as prescribed begin an exercise program report new symptoms to your doctor eat more whole grains, fruits and vegetables, lean meats and healthy fats Look over education mailed- low sodium diet Please talk to your doctor if you are having any unwanted side effects of medication Advanced directives packet mailed  Chronic Condition 2:  DIABETES Provider Assessment and Plan  Type 2 diabetes mellitus with other specified complication ([place]) - Primary     Relevant Medications    Dulaglutide (TRULICITY) 4.5 XL/2.4MW SOPN    losartan (COZAAR) 100 MG tablet    metFORMIN (GLUCOPHAGE XR) 500 MG 24 hr tablet    Other Relevant Orders    Bayer DCA Hb A1c Waived    Microalbumin / creatinine urine ratio    AMB Referral to Chronic Care Management Services   BP elevated slightly, not going to adjust medicines at this point but would like him to monitor closely at home Follow up plan: Return in about 3 months (around 07/03/2022), or if symptoms worsen or fail to improve, for Diabetes and appointment with [facility]my[facility] at some point for renewal of medication assistance. Patient's A1c is improved but still high at 9.0, needs continued assistance in management, likely needs to bring meter in and check more than once a day, could possibly benefit from CGM if it will be covered.  Currently using insulin 4 times daily and should be checking probably at least 4 times daily.  BP elevated slightly, not going to adjust medicines at this point but would like him to monitor closely at home      Expected Outcome/Goals Addressed This Visit (Provider CCM goals/Provider Assessment and plan  CCM (DIABETES) EXPECTED OUTCOME:  MONITOR, SELF-MANAGE AND REDUCE SYMPTOMS OF DIABETES Symptom Management Condition 2: Take medications as prescribed   Attend all scheduled provider appointments Call pharmacy for medication refills 3-7 days in advance of running out of medications Attend church or other social activities Perform all self care activities independently  Perform IADL's (shopping, preparing meals, housekeeping, managing finances) independently Call provider office  for new concerns or questions  check blood sugar at prescribed times: once daily check feet daily for cuts, sores or redness enter blood sugar readings and medication or insulin into daily  log take the blood sugar log to all doctor visits take the blood sugar meter to all doctor visits trim toenails straight across fill half of plate with vegetables limit fast food meals to no more than 1 per week manage portion size read food labels for fat, fiber, carbohydrates and portion size wash and dry feet carefully every day wear comfortable, cotton socks wear comfortable, well-fitting shoes Message sent to pharmacist that you have 8-10 days of Basaglar insulin left Look over education mailed- hypoglycemia  Problem List Patient Active Problem List   Diagnosis Date Noted   GAD (generalized anxiety disorder) 09/19/2020   Atherosclerosis of native arteries of extremity with intermittent claudication ([place]) 10/21/2017   Anaphylactic syndrome 10/21/2016   Current smoker 06/04/2016   Depression, recurrent ([place]) 06/04/2016   Type 2 diabetes mellitus with other specified complication ([place]) 16/60/6301   Essential hypertension, benign 05/09/2015   Mixed hyperlipidemia 05/09/2015    Medication Management  Current Outpatient Medications:    amLODipine (NORVASC) 10 MG tablet, Take 1 tablet (10 mg total) by mouth daily., Disp: 90 tablet, Rfl: 3   aspirin EC 81 MG tablet, Take 81 mg by mouth daily., Disp: , Rfl:    cilostazol (PLETAL) 100 MG tablet, Take 1 tablet (100 mg total) by mouth 2 (two) times daily., Disp: 180 tablet, Rfl: 3   citalopram (CELEXA) 40 MG tablet, Take 1 tablet (40 mg total) by mouth daily., Disp: 30 tablet, Rfl: 1   Dulaglutide (TRULICITY) 4.5 SW/1.0XN SOPN, Inject 4.5 mg as directed once a week. DX: E11.65, Disp: 6 mL, Rfl: 3   glucose blood (ACCU-CHEK GUIDE) test strip, Use as instructed to test blood sugar daily. DX: E11.65 (patient using guide meter now), Disp: 100 each, Rfl: 12   glucose monitoring kit (FREESTYLE) monitoring kit, 1 each by Does not apply route 2 (two) times daily before a meal., Disp: 1 each, Rfl: 1   hydrochlorothiazide (HYDRODIURIL) 25 MG  tablet, Take 1 tablet (25 mg total) by mouth daily., Disp: 90 tablet, Rfl: 3   hydrOXYzine (VISTARIL) 50 MG capsule, Take 1 capsule (50 mg total) by mouth 3 (three) times daily as needed., Disp: 90 capsule, Rfl: 1   Insulin Glargine (BASAGLAR KWIKPEN) 100 UNIT/ML, Inject 70 Units into the skin daily., Disp: 60 mL, Rfl: 5   insulin lispro (HUMALOG KWIKPEN) 100 UNIT/ML KwikPen, Inject 10 Units into the skin 3 (three) times daily with meals., Disp: 60 mL, Rfl: 11   Insulin Pen Needle (B-D ULTRAFINE III SHORT PEN) 31G X 8 MM MISC, 1 each by Does not apply route as directed., Disp: 150 each, Rfl: 3   losartan (COZAAR) 100 MG tablet, Take 1 tablet (100 mg total) by mouth daily., Disp: 90 tablet, Rfl: 3   metFORMIN (GLUCOPHAGE XR) 500 MG 24 hr tablet, Take 2 tablets (1,000 mg total) by mouth in the morning and at bedtime., Disp: 360 tablet, Rfl: 3   Potassium Chloride ER 20 MEQ TBCR, Take 20 mEq by mouth daily. , Disp: , Rfl:    pravastatin (PRAVACHOL) 80 MG tablet, Take 1 tablet (80 mg total) by mouth daily., Disp: 90 tablet, Rfl: 3   QUEtiapine (SEROQUEL) 50 MG tablet, Take 1 tablet (50 mg total) by mouth at bedtime., Disp: 30 tablet, Rfl: 1   Continuous Blood Gluc  Sensor (FREESTYLE LIBRE 2 SENSOR) MISC, Use to check blood sugar 4 times daily. Change sensor every 14 days. DX: E11.9 (Patient not taking: Reported on 05/19/2022), Disp: 6 each, Rfl: 11   lidocaine (XYLOCAINE) 5 % ointment, Apply 1 application topically 3 (three) times daily as needed. (Patient not taking: Reported on 05/19/2022), Disp: 35.44 g, Rfl: 1  Cognitive Assessment Identity Confirmed: : Name; DOB Cognitive Status: Normal   Functional Assessment Hearing Difficulty or Deaf: no Wear Glasses or Blind: no Concentrating, Remembering or Making Decisions Difficulty (CP): no Difficulty Communicating: no Difficulty Eating/Swallowing: no Walking or Climbing Stairs Difficulty: no Dressing/Bathing Difficulty: no Doing Errands  Independently Difficulty (such as shopping) (CP): no   Caregiver Assessment  Primary Source of Support/Comfort: nonrelative caregiver; community (nephew) Name of Support/Comfort Primary Source: nephew lives one mile away,  also co-workers, neighbors People in Home: alone Family Caregiver if Needed: none   Planned Interventions  Provided education to patient about basic DM disease process; Reviewed medications with patient and discussed importance of medication adherence;        Reviewed prescribed diet with patient carbohydrate modified; Counseled on importance of regular laboratory monitoring as prescribed;        Discussed plans with patient for ongoing care management follow up and provided patient with direct contact information for care management team;      Provided patient with written educational materials related to hypo and hyperglycemia and importance of correct treatment;       Reviewed scheduled/upcoming provider appointments including: primary care provider 05/26/22;         Review of patient status, including review of consultants reports, relevant laboratory and other test results, and medications completed;       Screening for signs and symptoms of depression related to chronic disease state;        Assessed social determinant of health barriers;        Message sent to Lottie Dawson pharmacist reporting pt has 8-10 days of Basaglar insulin left Evaluation of current treatment plan related to hypertension self management and patient's adherence to plan as established by provider;   Reviewed prescribed diet low sodium Reviewed medications with patient and discussed importance of compliance;  Counseled on the importance of exercise goals with target of 150 minutes per week Discussed plans with patient for ongoing care management follow up and provided patient with direct contact information for care management team; Advised patient, providing education and rationale, to  monitor blood pressure daily and record, calling PCP for findings outside established parameters;  Reviewed scheduled/upcoming provider appointments including:  Advised patient to discuss medication side effects with provider; Provided education on prescribed diet low sodium;  Discussed complications of poorly controlled blood pressure such as heart disease, stroke, circulatory complications, vision complications, kidney impairment, sexual dysfunction;  Screening for signs and symptoms of depression related to chronic disease state;  Assessed social determinant of health barriers;  Advanced directives mailed In basket message sent to primary care provider and [facility] pharmacist reporting pt declined psychiatry referral, is not taking seroquel due to feeling groggy, is not taking hydroxyzine due to side effects  Interaction and coordination with outside resources, practitioners, and providers See CCM Referral  Care Plan: Printed and mailed to patient

## 2022-05-20 ENCOUNTER — Telehealth: Payer: Self-pay

## 2022-05-20 NOTE — Telephone Encounter (Signed)
Patient has been scheduled for the 18th

## 2022-05-20 NOTE — Telephone Encounter (Signed)
-----   Message from Lavera Guise, [facility] sent at 05/20/2022  9:33 AM EST ----- Regarding: RE: update Can you have him schedule with PCP ASAP re: side effects from anxiety/depression meds We will probably have to switch medications  ----- Message ----- From: Kassie Mends, RN Sent: 05/19/2022   2:21 PM EST To: Fransisca Kaufmann Dettinger, MD; Lavera Guise, RPH Subject: update                                         Update - spoke w/ pt today and he ask that I speak with a co-worker who is trying to help him with medication management, she says pt cannot take hydroxyzine due to side effects (pt feels like he is going crazy)  pt is also not taking seroquel at hs due to feeling groggy.  Pt declined psychiatry referral and declined LCSW services for management of anxiety/ depression.  His depression score is low today, pt says this can vary from week to week depending on how stressed he is at work.  I included Lottie Dawson to see if she has any input for the meds due to side effects.   thanks

## 2022-05-22 ENCOUNTER — Other Ambulatory Visit: Payer: Self-pay | Admitting: Family Medicine

## 2022-05-26 ENCOUNTER — Ambulatory Visit (INDEPENDENT_AMBULATORY_CARE_PROVIDER_SITE_OTHER): Payer: No Typology Code available for payment source | Admitting: Family Medicine

## 2022-05-26 ENCOUNTER — Encounter: Payer: Self-pay | Admitting: Family Medicine

## 2022-05-26 VITALS — BP 150/76 | HR 58 | Temp 98.0°F | Ht 69.0 in | Wt 180.0 lb

## 2022-05-26 DIAGNOSIS — F411 Generalized anxiety disorder: Secondary | ICD-10-CM | POA: Diagnosis not present

## 2022-05-26 DIAGNOSIS — F339 Major depressive disorder, recurrent, unspecified: Secondary | ICD-10-CM | POA: Diagnosis not present

## 2022-05-26 NOTE — Progress Notes (Unsigned)
BP (!) 150/76   Pulse (!) 58   Temp 98 F (36.7 C)   Ht _0  (1.753 m)   Wt 180 lb (81.6 kg)   SpO2 97%   BMI 26.58 kg/m    Subjective:   Patient ID: Randy [name], male    DOB: [birth year], 71 y.o.   MRN: [id number]  HPI: Randy [name] is a 71 y.o. male presenting on 05/26/2022 for Medical Management of Chronic Issues, Anxiety, and Depression   HPI Depression and anxiety Patient is coming in for depression and anxiety.  Last time we spoke he was increased on escitalopram and restarted Seroquel and given hydroxyzine.  He does not know if he is taking any of them.  He says he is not taking any of them.  He is not always the best historian.  Call the pharmacy to see if he filled them and it does look like he filled them but he says he is not taking them.    05/19/2022   12:56 PM 04/02/2022    8:24 AM 11/27/2021    8:12 AM 08/14/2021    8:16 AM 08/14/2021    8:15 AM  Depression screen PHQ 2/9  Decreased Interest 0 0 0  0  Down, Depressed, Hopeless 1 0 0  0  PHQ - 2 Score 1 0 0  0  Altered sleeping  0 0 0   Tired, decreased energy  0 0 0   Change in appetite  0 0 0   Feeling bad or failure about yourself   0 0 0   Trouble concentrating  0 0 0   Moving slowly or fidgety/restless  0 0 0   Suicidal thoughts  0 0 0   PHQ-9 Score  0 0    Difficult doing work/chores  Not difficult at all Not difficult at all       Relevant past medical, surgical, family and social history reviewed and updated as indicated. Interim medical history since our last visit reviewed. Allergies and medications reviewed and updated.  Review of Systems  Constitutional:  Negative for chills and fever.  Eyes:  Negative for visual disturbance.  Respiratory:  Negative for shortness of breath and wheezing.   Cardiovascular:  Negative for chest pain and leg swelling.  Musculoskeletal:  Negative for back pain and gait problem.  Skin:  Negative for rash.  Neurological:  Negative for dizziness, weakness  and light-headedness.  All other systems reviewed and are negative.   Per HPI unless specifically indicated above   Allergies as of 05/26/2022   No Known Allergies      Medication List        Accurate as of May 26, 2022 11:59 PM. If you have any questions, ask your nurse or doctor.          Accu-Chek Guide test strip Generic drug: glucose blood Use as instructed to test blood sugar daily. DX: E11.65 (patient using guide meter now)   amLODipine 10 MG tablet Commonly known as: NORVASC Take 1 tablet (10 mg total) by mouth daily.   aspirin EC 81 MG tablet Take 81 mg by mouth daily.   Basaglar KwikPen 100 UNIT/ML Inject 70 Units into the skin daily.   cilostazol 100 MG tablet Commonly known as: PLETAL Take 1 tablet (100 mg total) by mouth 2 (two) times daily.   citalopram 40 MG tablet Commonly known as: CELEXA Take 1 tablet (40 mg total) by mouth daily.   FreeStyle [employer]  2 Sensor Misc Use to check blood sugar 4 times daily. Change sensor every 14 days. DX: E11.9   glucose monitoring kit monitoring kit 1 each by Does not apply route 2 (two) times daily before a meal.   GNP UltiCare Pen Needles 32G X 6 MM Misc Generic drug: Insulin Pen Needle Use 3 times daily DX: E11.65   hydrochlorothiazide 25 MG tablet Commonly known as: HYDRODIURIL Take 1 tablet (25 mg total) by mouth daily.   hydrOXYzine 50 MG capsule Commonly known as: VISTARIL Take 1 capsule (50 mg total) by mouth 3 (three) times daily as needed.   insulin lispro 100 UNIT/ML KwikPen Commonly known as: HumaLOG KwikPen Inject 10 Units into the skin 3 (three) times daily with meals.   lidocaine 5 % ointment Commonly known as: XYLOCAINE Apply 1 application topically 3 (three) times daily as needed.   losartan 100 MG tablet Commonly known as: COZAAR Take 1 tablet (100 mg total) by mouth daily.   metFORMIN 500 MG 24 hr tablet Commonly known as: Glucophage XR Take 2 tablets (1,000 mg total) by  mouth in the morning and at bedtime.   Potassium Chloride ER 20 MEQ Tbcr Take 20 mEq by mouth daily.   pravastatin 80 MG tablet Commonly known as: PRAVACHOL Take 1 tablet (80 mg total) by mouth daily.   QUEtiapine 50 MG tablet Commonly known as: SEROquel Take 1 tablet (50 mg total) by mouth at bedtime.   Trulicity 4.5 JI/9.6VE Sopn Generic drug: Dulaglutide Inject 4.5 mg as directed once a week. DX: E11.65         Objective:   BP (!) 150/76   Pulse (!) 58   Temp 98 F (36.7 C)   Ht _0  (1.753 m)   Wt 180 lb (81.6 kg)   SpO2 97%   BMI 26.58 kg/m   Wt Readings from Last 3 Encounters:  05/26/22 180 lb (81.6 kg)  05/05/22 175 lb (79.4 kg)  04/02/22 182 lb (82.6 kg)    Physical Exam Vitals and nursing note reviewed.  Constitutional:      General: He is not in acute distress.    Appearance: He is well-developed. He is not diaphoretic.  Eyes:     General: No scleral icterus.    Conjunctiva/sclera: Conjunctivae normal.  Neck:     Thyroid: No thyromegaly.  Skin:    General: Skin is warm and dry.     Findings: No rash.  Neurological:     Mental Status: He is alert and oriented to person, place, and time.     Coordination: Coordination normal.  Psychiatric:        Mood and Affect: Mood is anxious and depressed.        Behavior: Behavior normal.        Thought Content: Thought content does not include suicidal ideation. Thought content does not include suicidal plan.       Assessment & Plan:   Problem List Items Addressed This Visit       Other   Depression, recurrent ([place]) - Primary   GAD (generalized anxiety disorder)    Patient says he does not want any of those 3 medicines and is not taking them and he feels like he is doing better right now.  Will keep a close eye on him.  He denies any suicidal ideations Follow up plan: Return if symptoms worsen or fail to improve, for Depression and anxiety in 1 to 2 months.  Counseling provided for all  of the  vaccine components No orders of the defined types were placed in this encounter.   Caryl Pina, MD [facility] 05/28/2022, 8:04 AM

## 2022-05-26 NOTE — Patient Instructions (Signed)
Randy [name]

## 2022-06-08 DIAGNOSIS — E785 Hyperlipidemia, unspecified: Secondary | ICD-10-CM

## 2022-06-08 DIAGNOSIS — E1169 Type 2 diabetes mellitus with other specified complication: Secondary | ICD-10-CM

## 2022-06-08 DIAGNOSIS — Z794 Long term (current) use of insulin: Secondary | ICD-10-CM

## 2022-06-30 ENCOUNTER — Ambulatory Visit: Payer: No Typology Code available for payment source | Admitting: *Deleted

## 2022-06-30 DIAGNOSIS — I10 Essential (primary) hypertension: Secondary | ICD-10-CM

## 2022-06-30 DIAGNOSIS — E119 Type 2 diabetes mellitus without complications: Secondary | ICD-10-CM

## 2022-06-30 NOTE — Patient Instructions (Signed)
Please call the care guide team at [phone] if you need to cancel or reschedule your appointment.   If you are experiencing a Mental Health or [place] or need someone to talk to, please call the Suicide and Crisis Lifeline: 988 call the [place] National Suicide Prevention Lifeline: 737 365 3512 or TTY: (442)387-6383 TTY (478) 174-0744) to talk to a trained counselor call 1-800-273-TALK (toll free, 24 hour hotline) go to [facility] [address], [place] [phone]) call the [facility]: [phone] call 911   Following is a copy of the CCM Program Consent:  CCM service includes personalized support from designated clinical staff supervised by the physician, including individualized plan of care and coordination with other care providers 24/7 contact phone numbers for assistance for urgent and routine care needs. Service will only be billed when office clinical staff spend 20 minutes or more in a month to coordinate care. Only one practitioner may furnish and bill the service in a calendar month. The patient may stop CCM services at amy time (effective at the end of the month) by phone call to the office staff. The patient will be responsible for cost sharing (co-pay) or up to 20% of the service fee (after annual deductible is met)  Following is a copy of your full provider care plan:   Goals Addressed             This Visit's Progress    CCM (DIABETES) EXPECTED OUTCOME:  MONITOR, SELF-MANAGE AND REDUCE SYMPTOMS OF DIABETES       Current Barriers:  Knowledge Deficits related to Diabetes management Chronic Disease Management support and education needs related to Diabetes and diet No Advanced Directives in place- documents being mailed Patient reports he checks CBG once daily with readings fasting 108-169 (pt no longer uses [employer]).  Pt reports he does not follow a special diet, drinks diet soft  drinks Patient does not exercise, used to swim at [facility] and may start back Patient reports he has Basaglar insulin but will be out of soon and requests message be sent to [facility] pharmacist  Planned Interventions: Provided education to patient about basic DM disease process; Reviewed medications with patient and discussed importance of medication adherence;        Reviewed prescribed diet with patient carbohydrate modified; Counseled on importance of regular laboratory monitoring as prescribed;        Discussed plans with patient for ongoing care management follow up and provided patient with direct contact information for care management team;      Provided patient with written educational materials related to hypo and hyperglycemia and importance of correct treatment;       Reviewed scheduled/upcoming provider appointments including: primary care provider 05/26/22;         Review of patient status, including review of consultants reports, relevant laboratory and other test results, and medications completed;       Screening for signs and symptoms of depression related to chronic disease state;        Assessed social determinant of health barriers;        Message sent to Lottie Dawson pharmacist reporting pt has Basaglar insulin left but will be out soon Consulting civil engineer reinforced to patient that he needs to contact pharmacist for medication samples  Symptom Management: Take medications as prescribed   Attend all scheduled provider appointments Call pharmacy for medication refills 3-7 days in advance of running out of medications Attend church or other  social activities Perform all self care activities independently  Perform IADL's (shopping, preparing meals, housekeeping, managing finances) independently Call provider office for new concerns or questions  check blood sugar at prescribed times: once daily check feet daily for cuts, sores or redness enter blood sugar readings and medication or  insulin into daily log take the blood sugar log to all doctor visits take the blood sugar meter to all doctor visits trim toenails straight across drink 6 to 8 glasses of water each day fill half of plate with vegetables limit fast food meals to no more than 1 per week manage portion size read food labels for fat, fiber, carbohydrates and portion size keep feet up while sitting wash and dry feet carefully every day wear comfortable, cotton socks wear comfortable, well-fitting shoes Message sent to pharmacist that you are almost out of Basaglar insulin  Please contact pharmacist at primary care provider when you need medication samples  Follow Up Plan: Telephone follow up appointment with care management team member scheduled for:  08/22/22 at 215 pm       CCM (HYPERTENSION) EXPECTED OUTCOME: MONITOR, SELF-MANAGE AND REDUCE SYMPTOMS OF HYPERTENSION       Current Barriers:  Knowledge Deficits related to Hypertension management Chronic Disease Management support and education needs related to Hypertension, diet and exercise No Advanced Directives in place- pt requests documents be mailed Patient reports he lives alone, has nephew that lives close by he can call on if needed, works part time at [employer] and has people there that assist him if needed. Patient reports he checks blood pressure on occasion and states " readings are good" Patient states he decided he did not want to talk with psychiatrist (referral was done) Patient states he is not taking any medication for depression (celexa, seroquel) not taking hydroxyzine because of the side effects of all these medications (MD aware),  pt is using melatonin for sleep.   Planned Interventions: Evaluation of current treatment plan related to hypertension self management and patient's adherence to plan as established by provider;   Reviewed prescribed diet low sodium Reviewed medications with patient and discussed importance of  compliance;  Counseled on the importance of exercise goals with target of 150 minutes per week Advised patient, providing education and rationale, to monitor blood pressure daily and record, calling PCP for findings outside established parameters;  Reviewed scheduled/upcoming provider appointments including:  Advised patient to discuss medication side effects or any issues with medications with provider; Provided education on prescribed diet low sodium;  Discussed complications of poorly controlled blood pressure such as heart disease, stroke, circulatory complications, vision complications, kidney impairment, sexual dysfunction;   Symptom Management: Take medications as prescribed   Attend all scheduled provider appointments Call pharmacy for medication refills 3-7 days in advance of running out of medications Attend church or other social activities Perform all self care activities independently  Perform IADL's (shopping, preparing meals, housekeeping, managing finances) independently Call provider office for new concerns or questions  check blood pressure weekly choose a place to take my blood pressure (home, clinic or office, retail store) write blood pressure results in a log or diary learn about high blood pressure keep a blood pressure log take blood pressure log to all doctor appointments keep all doctor appointments take medications for blood pressure exactly as prescribed begin an exercise program report new symptoms to your doctor eat more whole grains, fruits and vegetables, lean meats and healthy fats Follow low sodium diet- read labels for  sodium content Please talk to your doctor if you are having any unwanted side effects of medications  Follow Up Plan: Telephone follow up appointment with care management team member scheduled for: 08/22/22 at 215 pm          The patient verbalized understanding of instructions, educational materials, and care plan provided today  and DECLINED offer to receive copy of patient instructions, educational materials, and care plan.   Telephone follow up appointment with care management team member scheduled for:  08/22/22 at 215 pm

## 2022-06-30 NOTE — Chronic Care Management (AMB) (Signed)
Chronic Care Management   CCM RN Visit Note  06/30/2022 Name: Randy [name] MRN: [id number] DOB: 01-21-1951  Subjective: Randy [name] is a 72 y.o. year old male who is a primary care patient of Dettinger, Fransisca Kaufmann, MD. The patient was referred to the Chronic Care Management team for assistance with care management needs subsequent to provider initiation of CCM services and plan of care.    Today's Visit:  Engaged with patient by telephone for follow up visit.        Goals Addressed             This Visit's Progress    CCM (DIABETES) EXPECTED OUTCOME:  MONITOR, SELF-MANAGE AND REDUCE SYMPTOMS OF DIABETES       Current Barriers:  Knowledge Deficits related to Diabetes management Chronic Disease Management support and education needs related to Diabetes and diet No Advanced Directives in place- documents being mailed Patient reports he checks CBG once daily with readings fasting 108-169 (pt no longer uses [employer]).  Pt reports he does not follow a special diet, drinks diet soft drinks Patient does not exercise, used to swim at [facility] and may start back Patient reports he has Basaglar insulin but will be out of soon and requests message be sent to [facility] pharmacist  Planned Interventions: Provided education to patient about basic DM disease process; Reviewed medications with patient and discussed importance of medication adherence;        Reviewed prescribed diet with patient carbohydrate modified; Counseled on importance of regular laboratory monitoring as prescribed;        Discussed plans with patient for ongoing care management follow up and provided patient with direct contact information for care management team;      Provided patient with written educational materials related to hypo and hyperglycemia and importance of correct treatment;       Reviewed scheduled/upcoming provider appointments including: primary care provider 05/26/22;         Review of patient  status, including review of consultants reports, relevant laboratory and other test results, and medications completed;       Screening for signs and symptoms of depression related to chronic disease state;        Assessed social determinant of health barriers;        Message sent to Lottie Dawson pharmacist reporting pt has Basaglar insulin left but will be out soon Consulting civil engineer reinforced to patient that he needs to contact pharmacist for medication samples  Symptom Management: Take medications as prescribed   Attend all scheduled provider appointments Call pharmacy for medication refills 3-7 days in advance of running out of medications Attend church or other social activities Perform all self care activities independently  Perform IADL's (shopping, preparing meals, housekeeping, managing finances) independently Call provider office for new concerns or questions  check blood sugar at prescribed times: once daily check feet daily for cuts, sores or redness enter blood sugar readings and medication or insulin into daily log take the blood sugar log to all doctor visits take the blood sugar meter to all doctor visits trim toenails straight across drink 6 to 8 glasses of water each day fill half of plate with vegetables limit fast food meals to no more than 1 per week manage portion size read food labels for fat, fiber, carbohydrates and portion size keep feet up while sitting wash and dry feet carefully every day wear comfortable, cotton socks wear comfortable, well-fitting shoes Message sent to pharmacist that  you are almost out of Basaglar insulin  Please contact pharmacist at primary care provider when you need medication samples  Follow Up Plan: Telephone follow up appointment with care management team member scheduled for:  08/22/22 at 215 pm       CCM (HYPERTENSION) EXPECTED OUTCOME: MONITOR, SELF-MANAGE AND REDUCE SYMPTOMS OF HYPERTENSION       Current Barriers:   Knowledge Deficits related to Hypertension management Chronic Disease Management support and education needs related to Hypertension, diet and exercise No Advanced Directives in place- pt requests documents be mailed Patient reports he lives alone, has nephew that lives close by he can call on if needed, works part time at [employer] and has people there that assist him if needed. Patient reports he checks blood pressure on occasion and states " readings are good" Patient states he decided he did not want to talk with psychiatrist (referral was done) Patient states he is not taking any medication for depression (celexa, seroquel) not taking hydroxyzine because of the side effects of all these medications (MD aware),  pt is using melatonin for sleep.   Planned Interventions: Evaluation of current treatment plan related to hypertension self management and patient's adherence to plan as established by provider;   Reviewed prescribed diet low sodium Reviewed medications with patient and discussed importance of compliance;  Counseled on the importance of exercise goals with target of 150 minutes per week Advised patient, providing education and rationale, to monitor blood pressure daily and record, calling PCP for findings outside established parameters;  Reviewed scheduled/upcoming provider appointments including:  Advised patient to discuss medication side effects or any issues with medications with provider; Provided education on prescribed diet low sodium;  Discussed complications of poorly controlled blood pressure such as heart disease, stroke, circulatory complications, vision complications, kidney impairment, sexual dysfunction;   Symptom Management: Take medications as prescribed   Attend all scheduled provider appointments Call pharmacy for medication refills 3-7 days in advance of running out of medications Attend church or other social activities Perform all self care  activities independently  Perform IADL's (shopping, preparing meals, housekeeping, managing finances) independently Call provider office for new concerns or questions  check blood pressure weekly choose a place to take my blood pressure (home, clinic or office, retail store) write blood pressure results in a log or diary learn about high blood pressure keep a blood pressure log take blood pressure log to all doctor appointments keep all doctor appointments take medications for blood pressure exactly as prescribed begin an exercise program report new symptoms to your doctor eat more whole grains, fruits and vegetables, lean meats and healthy fats Follow low sodium diet- read labels for sodium content Please talk to your doctor if you are having any unwanted side effects of medications  Follow Up Plan: Telephone follow up appointment with care management team member scheduled for: 08/22/22 at 215 pm          Plan:Telephone follow up appointment with care management team member scheduled for:  08/22/22 at 215 pm  Jacqlyn Larsen [facility], BSN RN Case Manager [place] [phone]

## 2022-07-03 ENCOUNTER — Telehealth: Payer: Self-pay | Admitting: *Deleted

## 2022-07-03 ENCOUNTER — Ambulatory Visit: Payer: Self-pay | Admitting: *Deleted

## 2022-07-03 DIAGNOSIS — E119 Type 2 diabetes mellitus without complications: Secondary | ICD-10-CM

## 2022-07-03 DIAGNOSIS — B351 Tinea unguium: Secondary | ICD-10-CM | POA: Diagnosis not present

## 2022-07-03 DIAGNOSIS — M79676 Pain in unspecified toe(s): Secondary | ICD-10-CM | POA: Diagnosis not present

## 2022-07-03 DIAGNOSIS — I10 Essential (primary) hypertension: Secondary | ICD-10-CM

## 2022-07-03 DIAGNOSIS — L84 Corns and callosities: Secondary | ICD-10-CM | POA: Diagnosis not present

## 2022-07-03 DIAGNOSIS — E1142 Type 2 diabetes mellitus with diabetic polyneuropathy: Secondary | ICD-10-CM | POA: Diagnosis not present

## 2022-07-03 NOTE — Telephone Encounter (Signed)
   CCM RN Visit Note   07/03/22 Name: Randy [name] MRN: [id number]      DOB: 09/20/1950  Subjective: Randy [name] is a 72 y.o. year old male who is a primary care patient of Caryl Pina MD. The patient was referred to the Chronic Care Management team for assistance with care management needs subsequent to provider initiation of CCM services and plan of care.      An unsuccessful telephone outreach was attempted today to contact the patient about Chronic Care Management needs.    Plan:Telephone follow up appointment with care management team member scheduled for:  will attempt again today.  Jacqlyn Larsen RNC, BSN RN Case Manager [place] [phone]

## 2022-07-03 NOTE — Telephone Encounter (Signed)
Received notification from [employer] regarding RE-ENROLLMENT approval for [facility], TRULICITY 4.'5MG'$ , AND [employer]. Patient assistance approved from 06/09/22 to 06/09/23.  HUMALOG: DELIVERED 06/20/22  TRULCITY: SHIPMENT PROCESSING; MED WAS OUT OF STOCK BUT NOW PROCESSING TO SHIP.  BASAGLAR: SHIPMENT PROCESSING, SHOULD ARRIVE WITH TRULICITY  MEDICATIONS ON AUTOFILL

## 2022-07-03 NOTE — Patient Instructions (Signed)
Please call the care guide team at [phone] if you need to cancel or reschedule your appointment.   If you are experiencing a Mental Health or [place] or need someone to talk to, please call the Suicide and Crisis Lifeline: 988 call the [place] National Suicide Prevention Lifeline: (867)439-5316 or TTY: (765) 635-9914 TTY (956) 078-0005) to talk to a trained counselor call 1-800-273-TALK (toll free, 24 hour hotline) go to [facility] [address] [place] [phone]) call the [facility]: [phone] call 911   Following is a copy of the CCM Program Consent:  CCM service includes personalized support from designated clinical staff supervised by the physician, including individualized plan of care and coordination with other care providers 24/7 contact phone numbers for assistance for urgent and routine care needs. Service will only be billed when office clinical staff spend 20 minutes or more in a month to coordinate care. Only one practitioner may furnish and bill the service in a calendar month. The patient may stop CCM services at amy time (effective at the end of the month) by phone call to the office staff. The patient will be responsible for cost sharing (co-pay) or up to 20% of the service fee (after annual deductible is met)  Following is a copy of your full provider care plan:   Goals Addressed             This Visit's Progress    CCM (DIABETES) EXPECTED OUTCOME:  MONITOR, SELF-MANAGE AND REDUCE SYMPTOMS OF DIABETES       Current Barriers:  Knowledge Deficits related to Diabetes management Chronic Disease Management support and education needs related to Diabetes and diet No Advanced Directives in place- documents being mailed Patient reports he checks CBG once daily with readings fasting 108-169 (pt no longer uses [employer]).  Pt reports he does not follow a special diet, drinks diet soft  drinks Patient does not exercise, used to swim at [facility] and may start back Patient reports he has Basaglar insulin but will be out of soon and requests message be sent to [facility] pharmacist Message received from pharmacist at primary care provider reporting pt can pickup Tresiba samples and use at same dosage as Engineer, agricultural, they are working on [employer] refills  Planned Interventions: Provided education to patient about basic DM disease process; Reviewed medications with patient and discussed importance of medication adherence;        Reviewed prescribed diet with patient carbohydrate modified; Counseled on importance of regular laboratory monitoring as prescribed;        Discussed plans with patient for ongoing care management follow up and provided patient with direct contact information for care management team;      Provided patient with written educational materials related to hypo and hyperglycemia and importance of correct treatment;       Reviewed scheduled/upcoming provider appointments including: primary care provider 05/26/22;         Review of patient status, including review of consultants reports, relevant laboratory and other test results, and medications completed;       Screening for signs and symptoms of depression related to chronic disease state;        Assessed social determinant of health barriers;        RN care manager reinforced to patient that he needs to contact pharmacist for medication samples RN care manager informed pt that Tyler Aas samples are available for pickup at primary care provider office and use at the  same dosage as Basaglar, pt verbalizes understanding and states he will pickup at upcoming appointment In basket message sent to pharmacist Lottie Dawson with update that patient will pickup Tresiba at upcoming appointment  Symptom Management: Take medications as prescribed   Attend all scheduled provider appointments Call pharmacy for medication refills 3-7  days in advance of running out of medications Attend church or other social activities Perform all self care activities independently  Perform IADL's (shopping, preparing meals, housekeeping, managing finances) independently Call provider office for new concerns or questions  check blood sugar at prescribed times: once daily check feet daily for cuts, sores or redness enter blood sugar readings and medication or insulin into daily log take the blood sugar log to all doctor visits take the blood sugar meter to all doctor visits trim toenails straight across drink 6 to 8 glasses of water each day fill half of plate with vegetables limit fast food meals to no more than 1 per week manage portion size read food labels for fat, fiber, carbohydrates and portion size keep feet up while sitting wash and dry feet carefully every day wear comfortable, cotton socks wear comfortable, well-fitting shoes Please contact pharmacist at primary care provider when you need medication samples Please pickup Tresiba samples and use at same dosage as Basaglar Please talk with pharmacist about any concerns with medication assistance  Follow Up Plan: Telephone follow up appointment with care management team member scheduled for:  08/22/22 at 215 pm          The patient verbalized understanding of instructions, educational materials, and care plan provided today and DECLINED offer to receive copy of patient instructions, educational materials, and care plan.   Telephone follow up appointment with care management team member scheduled for:  08/22/22 at 215 pm

## 2022-07-03 NOTE — Telephone Encounter (Signed)
Can we check on status of [facility] refills for 1 yr in December 2023 Can you help patient call to get them Thank you!

## 2022-07-03 NOTE — Chronic Care Management (AMB) (Signed)
Chronic Care Management   CCM RN Visit Note  07/03/2022 Name: Randy [name] MRN: [id number] DOB: 19-Nov-1950  Subjective: Randy [name] is a 72 y.o. year old male who is a primary care patient of Dettinger, Fransisca Kaufmann, MD. The patient was referred to the Chronic Care Management team for assistance with care management needs subsequent to provider initiation of CCM services and plan of care.    Today's Visit:  Engaged with patient by telephone for follow up visit.        Goals Addressed             This Visit's Progress    CCM (DIABETES) EXPECTED OUTCOME:  MONITOR, SELF-MANAGE AND REDUCE SYMPTOMS OF DIABETES       Current Barriers:  Knowledge Deficits related to Diabetes management Chronic Disease Management support and education needs related to Diabetes and diet No Advanced Directives in place- documents being mailed Patient reports he checks CBG once daily with readings fasting 108-169 (pt no longer uses [employer]).  Pt reports he does not follow a special diet, drinks diet soft drinks Patient does not exercise, used to swim at [facility] and may start back Patient reports he has Basaglar insulin but will be out of soon and requests message be sent to [facility] [employer] pharmacist Message received from pharmacist at primary care provider reporting pt can pickup Tresiba samples and use at same dosage as Engineer, agricultural, they are working on [employer] refills  Planned Interventions: Provided education to patient about basic DM disease process; Reviewed medications with patient and discussed importance of medication adherence;        Reviewed prescribed diet with patient carbohydrate modified; Counseled on importance of regular laboratory monitoring as prescribed;        Discussed plans with patient for ongoing care management follow up and provided patient with direct contact information for care management team;      Provided patient with written educational materials related to hypo and  hyperglycemia and importance of correct treatment;       Reviewed scheduled/upcoming provider appointments including: primary care provider 05/26/22;         Review of patient status, including review of consultants reports, relevant laboratory and other test results, and medications completed;       Screening for signs and symptoms of depression related to chronic disease state;        Assessed social determinant of health barriers;        RN care manager reinforced to patient that he needs to contact pharmacist for medication samples RN care manager informed pt that Tyler Aas samples are available for pickup at primary care provider office and use at the same dosage as Basaglar, pt verbalizes understanding and states he will pickup at upcoming appointment In basket message sent to pharmacist Lottie Dawson with update that patient will pickup Tresiba at upcoming appointment  Symptom Management: Take medications as prescribed   Attend all scheduled provider appointments Call pharmacy for medication refills 3-7 days in advance of running out of medications Attend church or other social activities Perform all self care activities independently  Perform IADL's (shopping, preparing meals, housekeeping, managing finances) independently Call provider office for new concerns or questions  check blood sugar at prescribed times: once daily check feet daily for cuts, sores or redness enter blood sugar readings and medication or insulin into daily log take the blood sugar log to all doctor visits take the blood sugar meter to all doctor visits trim toenails straight  across drink 6 to 8 glasses of water each day fill half of plate with vegetables limit fast food meals to no more than 1 per week manage portion size read food labels for fat, fiber, carbohydrates and portion size keep feet up while sitting wash and dry feet carefully every day wear comfortable, cotton socks wear comfortable,  well-fitting shoes Please contact pharmacist at primary care provider when you need medication samples Please pickup Tresiba samples and use at same dosage as Basaglar Please talk with pharmacist about any concerns with medication assistance  Follow Up Plan: Telephone follow up appointment with care management team member scheduled for:  08/22/22 at 215 pm          Plan:Telephone follow up appointment with care management team member scheduled for:  08/22/22 at 215 pm  [facility], BSN RN Case Manager [place] [phone]

## 2022-07-07 DIAGNOSIS — F418 Other specified anxiety disorders: Secondary | ICD-10-CM | POA: Diagnosis not present

## 2022-07-07 DIAGNOSIS — E1165 Type 2 diabetes mellitus with hyperglycemia: Secondary | ICD-10-CM | POA: Diagnosis not present

## 2022-07-07 DIAGNOSIS — I493 Ventricular premature depolarization: Secondary | ICD-10-CM | POA: Diagnosis not present

## 2022-07-07 DIAGNOSIS — R030 Elevated blood-pressure reading, without diagnosis of hypertension: Secondary | ICD-10-CM | POA: Diagnosis not present

## 2022-07-07 DIAGNOSIS — M79672 Pain in left foot: Secondary | ICD-10-CM | POA: Diagnosis not present

## 2022-07-07 DIAGNOSIS — Z6827 Body mass index (BMI) 27.0-27.9, adult: Secondary | ICD-10-CM | POA: Diagnosis not present

## 2022-07-09 ENCOUNTER — Encounter: Payer: Self-pay | Admitting: Family Medicine

## 2022-07-09 ENCOUNTER — Ambulatory Visit: Payer: No Typology Code available for payment source | Admitting: Family Medicine

## 2022-07-09 VITALS — BP 110/58 | HR 94 | Ht 69.0 in | Wt 177.0 lb

## 2022-07-09 DIAGNOSIS — F339 Major depressive disorder, recurrent, unspecified: Secondary | ICD-10-CM | POA: Diagnosis not present

## 2022-07-09 DIAGNOSIS — E782 Mixed hyperlipidemia: Secondary | ICD-10-CM | POA: Diagnosis not present

## 2022-07-09 DIAGNOSIS — I10 Essential (primary) hypertension: Secondary | ICD-10-CM

## 2022-07-09 DIAGNOSIS — F411 Generalized anxiety disorder: Secondary | ICD-10-CM

## 2022-07-09 DIAGNOSIS — E119 Type 2 diabetes mellitus without complications: Secondary | ICD-10-CM

## 2022-07-09 DIAGNOSIS — E1159 Type 2 diabetes mellitus with other circulatory complications: Secondary | ICD-10-CM

## 2022-07-09 DIAGNOSIS — Z794 Long term (current) use of insulin: Secondary | ICD-10-CM

## 2022-07-09 DIAGNOSIS — E1169 Type 2 diabetes mellitus with other specified complication: Secondary | ICD-10-CM | POA: Diagnosis not present

## 2022-07-09 LAB — CBC WITH DIFFERENTIAL/PLATELET
Basophils Absolute: 0.1 10*3/uL (ref 0.0–0.2)
Basos: 1 %
EOS (ABSOLUTE): 0.1 10*3/uL (ref 0.0–0.4)
Eos: 1 %
Hematocrit: 44.1 % (ref 37.5–51.0)
Hemoglobin: 14.9 g/dL (ref 13.0–17.7)
Immature Grans (Abs): 0 10*3/uL (ref 0.0–0.1)
Immature Granulocytes: 1 %
Lymphocytes Absolute: 1.5 10*3/uL (ref 0.7–3.1)
Lymphs: 17 %
MCH: 33.2 pg — ABNORMAL HIGH (ref 26.6–33.0)
MCHC: 33.8 g/dL (ref 31.5–35.7)
MCV: 98 fL — ABNORMAL HIGH (ref 79–97)
Monocytes Absolute: 0.7 10*3/uL (ref 0.1–0.9)
Monocytes: 8 %
Neutrophils Absolute: 6.4 10*3/uL (ref 1.4–7.0)
Neutrophils: 72 %
Platelets: 217 10*3/uL (ref 150–450)
RBC: 4.49 x10E6/uL (ref 4.14–5.80)
RDW: 12.3 % (ref 11.6–15.4)
WBC: 8.7 10*3/uL (ref 3.4–10.8)

## 2022-07-09 LAB — BMP8+EGFR
BUN/Creatinine Ratio: 21 (ref 10–24)
BUN: 25 mg/dL (ref 8–27)
CO2: 26 mmol/L (ref 20–29)
Calcium: 9.5 mg/dL (ref 8.6–10.2)
Chloride: 99 mmol/L (ref 96–106)
Creatinine, Ser: 1.17 mg/dL (ref 0.76–1.27)
Glucose: 131 mg/dL — ABNORMAL HIGH (ref 70–99)
Potassium: 4.6 mmol/L (ref 3.5–5.2)
Sodium: 139 mmol/L (ref 134–144)
eGFR: 67 mL/min/{1.73_m2} (ref >59)

## 2022-07-09 LAB — LIPID PANEL
Chol/HDL Ratio: 3.1 ratio (ref 0.0–5.0)
Cholesterol, Total: 152 mg/dL (ref 100–199)
HDL: 49 mg/dL (ref >39)
LDL Chol Calc (NIH): 84 mg/dL (ref 0–99)
Triglycerides: 106 mg/dL (ref 0–149)
VLDL Cholesterol Cal: 19 mg/dL (ref 5–40)

## 2022-07-09 LAB — BAYER DCA HB A1C WAIVED: HB A1C (BAYER DCA - WAIVED): 8.4 % — ABNORMAL HIGH (ref 4.8–5.6)

## 2022-07-09 NOTE — Patient Instructions (Addendum)
[place]  [phone] For Basaglar.

## 2022-07-09 NOTE — Progress Notes (Signed)
BP (!) 110/58   Pulse 94   Ht '5\' 9"'$  (1.753 m)   Wt 177 lb (80.3 kg)   SpO2 98%   BMI 26.14 kg/m    Subjective:   Patient ID: Randy [name], male    DOB: [birth year], 72 y.o.   MRN: [id number]  HPI: Randy [name] is a 72 y.o. male presenting on 07/09/2022 for Medical Management of Chronic Issues, Diabetes, Hypertension, Anxiety, and Depression   HPI Type 2 diabetes mellitus Patient comes in today for recheck of his diabetes. Patient has been currently taking Trulicity and Humalog, has been out the Lantus and stopped the metformin because of diarrhea. Patient is currently on an ACE inhibitor/ARB. Patient has not seen an ophthalmologist this year. Patient denies any issues with their feet. The symptom started onset as an adult hypertension and hyperlipidemia and atherosclerosis ARE RELATED TO DM   Hypertension Patient is currently on amlodipine and hydrochlorothiazide and losartan, and their blood pressure today is 110/58. Patient denies any lightheadedness or dizziness. Patient denies headaches, blurred vision, chest pains, shortness of breath, or weakness. Denies any side effects from medication and is content with current medication.   Hyperlipidemia Patient is coming in for recheck of his hyperlipidemia. The patient is currently taking pravastatin. They deny any issues with myalgias or history of liver damage from it. They deny any focal numbness or weakness or chest pain.   Depression and anxiety recheck Patient is coming in today for depression and anxiety recheck.  he is currently on citalopram and hydroxyzine as needed.  He also takes Seroquel in the evenings.  He feels like he is doing okay.  He denies any major issues and says the medicine seems to be doing okay for him.  He denies any suicidal ideations or thoughts of hurting himself.    07/09/2022    8:36 AM 05/19/2022   12:56 PM 04/02/2022    8:24 AM 11/27/2021    8:12 AM 08/14/2021    8:16 AM  Depression screen PHQ  2/9  Decreased Interest 0 0 0 0   Down, Depressed, Hopeless 0 1 0 0   PHQ - 2 Score 0 1 0 0   Altered sleeping 0  0 0 0  Tired, decreased energy 0  0 0 0  Change in appetite 0  0 0 0  Feeling bad or failure about yourself  0  0 0 0  Trouble concentrating 0  0 0 0  Moving slowly or fidgety/restless 0  0 0 0  Suicidal thoughts 0  0 0 0  PHQ-9 Score 0  0 0   Difficult doing work/chores Not difficult at all  Not difficult at all Not difficult at all      Relevant past medical, surgical, family and social history reviewed and updated as indicated. Interim medical history since our last visit reviewed. Allergies and medications reviewed and updated.  Review of Systems  Constitutional:  Negative for chills and fever.  Eyes:  Negative for visual disturbance.  Respiratory:  Negative for shortness of breath and wheezing.   Cardiovascular:  Negative for chest pain and leg swelling.  Musculoskeletal:  Negative for back pain and gait problem.  Skin:  Negative for rash.  Neurological:  Negative for dizziness, weakness and numbness.  All other systems reviewed and are negative.   Per HPI unless specifically indicated above   Allergies as of 07/09/2022   No Known Allergies      Medication List  Accurate as of July 09, 2022  9:20 AM. If you have any questions, ask your nurse or doctor.          STOP taking these medications    metFORMIN 500 MG 24 hr tablet Commonly known as: Glucophage XR Stopped by: Fransisca Kaufmann Adriene Padula, MD       TAKE these medications    Accu-Chek Guide test strip Generic drug: glucose blood Use as instructed to test blood sugar daily. DX: E11.65 (patient using guide meter now)   amLODipine 10 MG tablet Commonly known as: NORVASC Take 1 tablet (10 mg total) by mouth daily.   aspirin EC 81 MG tablet Take 81 mg by mouth daily.   Basaglar KwikPen 100 UNIT/ML Inject 70 Units into the skin daily.   cilostazol 100 MG tablet Commonly known as:  PLETAL Take 1 tablet (100 mg total) by mouth 2 (two) times daily.   citalopram 40 MG tablet Commonly known as: CELEXA Take 1 tablet (40 mg total) by mouth daily.   FreeStyle Libre 2 Sensor Misc Use to check blood sugar 4 times daily. Change sensor every 14 days. DX: E11.9   glucose monitoring kit monitoring kit 1 each by Does not apply route 2 (two) times daily before a meal.   GNP UltiCare Pen Needles 32G X 6 MM Misc Generic drug: Insulin Pen Needle Use 3 times daily DX: E11.65   hydrochlorothiazide 25 MG tablet Commonly known as: HYDRODIURIL Take 1 tablet (25 mg total) by mouth daily.   hydrOXYzine 50 MG capsule Commonly known as: VISTARIL Take 1 capsule (50 mg total) by mouth 3 (three) times daily as needed.   insulin lispro 100 UNIT/ML KwikPen Commonly known as: HumaLOG KwikPen Inject 10 Units into the skin 3 (three) times daily with meals.   lidocaine 5 % ointment Commonly known as: XYLOCAINE Apply 1 application topically 3 (three) times daily as needed.   losartan 100 MG tablet Commonly known as: COZAAR Take 1 tablet (100 mg total) by mouth daily.   Potassium Chloride ER 20 MEQ Tbcr Take 20 mEq by mouth daily.   pravastatin 80 MG tablet Commonly known as: PRAVACHOL Take 1 tablet (80 mg total) by mouth daily.   QUEtiapine 50 MG tablet Commonly known as: SEROquel Take 1 tablet (50 mg total) by mouth at bedtime.   Trulicity 4.5 ST/4.1DQ Sopn Generic drug: Dulaglutide Inject 4.5 mg as directed once a week. DX: E11.65         Objective:   BP (!) 110/58   Pulse 94   Ht '5\' 9"'$  (1.753 m)   Wt 177 lb (80.3 kg)   SpO2 98%   BMI 26.14 kg/m   Wt Readings from Last 3 Encounters:  07/09/22 177 lb (80.3 kg)  05/26/22 180 lb (81.6 kg)  05/05/22 175 lb (79.4 kg)    Physical Exam Vitals and nursing note reviewed.  Constitutional:      General: He is not in acute distress.    Appearance: He is well-developed. He is not diaphoretic.  Eyes:     General: No  scleral icterus.    Conjunctiva/sclera: Conjunctivae normal.  Neck:     Thyroid: No thyromegaly.  Cardiovascular:     Rate and Rhythm: Normal rate and regular rhythm.     Heart sounds: Normal heart sounds. No murmur heard. Pulmonary:     Effort: Pulmonary effort is normal. No respiratory distress.     Breath sounds: Normal breath sounds. No wheezing.  Musculoskeletal:  General: No swelling. Normal range of motion.     Cervical back: Neck supple.  Lymphadenopathy:     Cervical: No cervical adenopathy.  Skin:    General: Skin is warm and dry.     Findings: No rash.  Neurological:     Mental Status: He is alert and oriented to person, place, and time.     Coordination: Coordination normal.  Psychiatric:        Behavior: Behavior normal.       Assessment & Plan:   Problem List Items Addressed This Visit       Cardiovascular and Mediastinum   Essential hypertension, benign   Relevant Orders   CBC with Differential/Platelet   BMP8+EGFR   Lipid panel   Bayer DCA Hb A1c Waived     Endocrine   Type 2 diabetes mellitus with other specified complication ([place])     Other   Mixed hyperlipidemia   Relevant Orders   CBC with Differential/Platelet   BMP8+EGFR   Lipid panel   Bayer DCA Hb A1c Waived   Depression, recurrent ([place])   GAD (generalized anxiety disorder)   Other Visit Diagnoses     Type 2 diabetes mellitus without complication, without long-term current use of insulin ([place])    -  Primary   Relevant Orders   CBC with Differential/Platelet   BMP8+EGFR   Lipid panel   Bayer DCA Hb A1c Waived       A1c is improved but still up at 8.4.  We gave him a sample of [place]jeo in the morning to try and get him back on his Basaglar.  Seems to be improved. Follow up plan: Return in about 3 months (around 10/07/2022), or if symptoms worsen or fail to improve, for Diabetes recheck.  Counseling provided for all of the vaccine components Orders Placed This Encounter   Procedures   CBC with Differential/Platelet   BMP8+EGFR   Lipid panel   Bayer DCA Hb A1c Waived    Caryl Pina, MD [facility] 07/09/2022, 9:20 AM

## 2022-07-17 ENCOUNTER — Encounter: Payer: Self-pay | Admitting: *Deleted

## 2022-08-20 ENCOUNTER — Other Ambulatory Visit: Payer: Self-pay | Admitting: Family Medicine

## 2022-08-20 DIAGNOSIS — H52223 Regular astigmatism, bilateral: Secondary | ICD-10-CM | POA: Diagnosis not present

## 2022-08-20 DIAGNOSIS — H25813 Combined forms of age-related cataract, bilateral: Secondary | ICD-10-CM | POA: Diagnosis not present

## 2022-08-20 DIAGNOSIS — E119 Type 2 diabetes mellitus without complications: Secondary | ICD-10-CM | POA: Diagnosis not present

## 2022-08-20 DIAGNOSIS — E782 Mixed hyperlipidemia: Secondary | ICD-10-CM

## 2022-08-20 DIAGNOSIS — E1169 Type 2 diabetes mellitus with other specified complication: Secondary | ICD-10-CM

## 2022-08-20 DIAGNOSIS — H524 Presbyopia: Secondary | ICD-10-CM | POA: Diagnosis not present

## 2022-08-20 DIAGNOSIS — H5203 Hypermetropia, bilateral: Secondary | ICD-10-CM | POA: Diagnosis not present

## 2022-08-20 LAB — HM DIABETES EYE EXAM

## 2022-08-22 ENCOUNTER — Ambulatory Visit: Payer: No Typology Code available for payment source | Admitting: *Deleted

## 2022-08-22 DIAGNOSIS — I10 Essential (primary) hypertension: Secondary | ICD-10-CM

## 2022-08-22 DIAGNOSIS — E119 Type 2 diabetes mellitus without complications: Secondary | ICD-10-CM

## 2022-08-22 NOTE — Patient Instructions (Signed)
Please call the care guide team at [phone] if you need to cancel or reschedule your appointment.   If you are experiencing a Mental Health or [place] or need someone to talk to, please call the Suicide and Crisis Lifeline: 988 call the [place] National Suicide Prevention Lifeline: 539-371-1801 or TTY: 407-376-1247 TTY 806-737-5439) to talk to a trained counselor call 1-800-273-TALK (toll free, 24 hour hotline) go to [facility] [address] [place] [phone]) call the [facility]: [phone] call 911   Following is a copy of the CCM Program Consent:  CCM service includes personalized support from designated clinical staff supervised by the physician, including individualized plan of care and coordination with other care providers 24/7 contact phone numbers for assistance for urgent and routine care needs. Service will only be billed when office clinical staff spend 20 minutes or more in a month to coordinate care. Only one practitioner may furnish and bill the service in a calendar month. The patient may stop CCM services at amy time (effective at the end of the month) by phone call to the office staff. The patient will be responsible for cost sharing (co-pay) or up to 20% of the service fee (after annual deductible is met)  Following is a copy of your full provider care plan:   Goals Addressed             This Visit's Progress    CCM (DIABETES) EXPECTED OUTCOME:  MONITOR, SELF-MANAGE AND REDUCE SYMPTOMS OF DIABETES       Current Barriers:  Knowledge Deficits related to Diabetes management Chronic Disease Management support and education needs related to Diabetes and diet No Advanced Directives in place- documents being mailed Patient reports he checks CBG once daily with readings fasting low 100's range.  Pt reports he does not follow a special diet, drinks diet soft drinks Patient does not  exercise, used to swim at [facility] and may start back Patient reports he has all medication including insulin  Planned Interventions: Reviewed medications with patient and discussed importance of medication adherence;        Counseled on importance of regular laboratory monitoring as prescribed;        Discussed plans with patient for ongoing care management follow up and provided patient with direct contact information for care management team;      Provided patient with written educational materials related to hypo and hyperglycemia and importance of correct treatment;       Reviewed scheduled/upcoming provider appointments including: primary care provider 10/29/22 at 910 am;         Review of patient status, including review of consultants reports, relevant laboratory and other test results, and medications completed;       Advised patient to discuss any issues with blood sugar, medications with provider;      RN care manager reinforced to patient that he needs to contact pharmacist for medication samples before he runs out of medication  Symptom Management: Take medications as prescribed   Attend all scheduled provider appointments Call pharmacy for medication refills 3-7 days in advance of running out of medications Attend church or other social activities Perform all self care activities independently  Perform IADL's (shopping, preparing meals, housekeeping, managing finances) independently Call provider office for new concerns or questions  check blood sugar at prescribed times: once daily check feet daily for cuts, sores or redness enter blood sugar readings and medication or insulin into daily log take  the blood sugar log to all doctor visits take the blood sugar meter to all doctor visits trim toenails straight across drink 6 to 8 glasses of water each day fill half of plate with vegetables limit fast food meals to no more than 1 per week manage portion size prepare main meal at  home 3 to 5 days each week read food labels for fat, fiber, carbohydrates and portion size keep feet up while sitting wash and dry feet carefully every day wear comfortable, cotton socks wear comfortable, well-fitting shoes Please contact pharmacist at primary care provider when you need medication samples Follow up with primary care provider on 10/29/22 at 910 am  Follow Up Plan: Telephone follow up appointment with care management team member scheduled for:  11/26/22 at 215 pm       CCM (HYPERTENSION) EXPECTED OUTCOME: MONITOR, SELF-MANAGE AND REDUCE SYMPTOMS OF HYPERTENSION       Current Barriers:  Knowledge Deficits related to Hypertension management Chronic Disease Management support and education needs related to Hypertension, diet and exercise No Advanced Directives in place- pt requests documents be mailed Patient reports he lives alone, has nephew that lives close by he can call on if needed, works part time at [employer] and has people there that assist him if needed. Patient reports he checks blood pressure on occasion and states " readings are good" Patient states he decided he did not want to talk with psychiatrist (referral was done) Patient states he is not taking any medication for depression (celexa, seroquel) not taking hydroxyzine because of the side effects of all these medications (MD aware),  pt is using melatonin for sleep. Patient reports overall he feels he is doing well   Planned Interventions: Evaluation of current treatment plan related to hypertension self management and patient's adherence to plan as established by provider;   Reviewed medications with patient and discussed importance of compliance;  Counseled on the importance of exercise goals with target of 150 minutes per week Advised patient, providing education and rationale, to monitor blood pressure daily and record, calling PCP for findings outside established parameters;  Reviewed  scheduled/upcoming provider appointments including:  Advised patient to discuss medication side effects or any issues with medications with provider; Discussed complications of poorly controlled blood pressure such as heart disease, stroke, circulatory complications, vision complications, kidney impairment, sexual dysfunction;  Reinforced low sodium diet and healthy food choices  Symptom Management: Take medications as prescribed   Attend all scheduled provider appointments Call pharmacy for medication refills 3-7 days in advance of running out of medications Attend church or other social activities Perform all self care activities independently  Perform IADL's (shopping, preparing meals, housekeeping, managing finances) independently Call provider office for new concerns or questions  check blood pressure weekly choose a place to take my blood pressure (home, clinic or office, retail store) write blood pressure results in a log or diary learn about high blood pressure keep a blood pressure log take blood pressure log to all doctor appointments keep all doctor appointments take medications for blood pressure exactly as prescribed begin an exercise program report new symptoms to your doctor eat more whole grains, fruits and vegetables, lean meats and healthy fats Follow low sodium diet- read labels for sodium content Limit/ avoid fast food Please talk to your doctor if you are having any unwanted side effects of medications  Follow Up Plan: Telephone follow up appointment with care management team member scheduled for:   11/26/22 at 215 pm  The patient verbalized understanding of instructions, educational materials, and care plan provided today and DECLINED offer to receive copy of patient instructions, educational materials, and care plan.   Telephone follow up appointment with care management team member scheduled for:  11/26/22 at 215 pm

## 2022-08-22 NOTE — Chronic Care Management (AMB) (Signed)
Chronic Care Management   CCM RN Visit Note  08/22/2022 Name: Randy [name] MRN: [id number] DOB: 1950/08/31  Subjective: Randy [name] is a 72 y.o. year old male who is a primary care patient of Dettinger, Fransisca Kaufmann, MD. The patient was referred to the Chronic Care Management team for assistance with care management needs subsequent to provider initiation of CCM services and plan of care.    Today's Visit:  Engaged with patient by telephone for follow up visit.        Goals Addressed             This Visit's Progress    CCM (DIABETES) EXPECTED OUTCOME:  MONITOR, SELF-MANAGE AND REDUCE SYMPTOMS OF DIABETES       Current Barriers:  Knowledge Deficits related to Diabetes management Chronic Disease Management support and education needs related to Diabetes and diet No Advanced Directives in place- documents being mailed Patient reports he checks CBG once daily with readings fasting low 100's range.  Pt reports he does not follow a special diet, drinks diet soft drinks Patient does not exercise, used to swim at [facility] and may start back Patient reports he has all medication including insulin  Planned Interventions: Reviewed medications with patient and discussed importance of medication adherence;        Counseled on importance of regular laboratory monitoring as prescribed;        Discussed plans with patient for ongoing care management follow up and provided patient with direct contact information for care management team;      Provided patient with written educational materials related to hypo and hyperglycemia and importance of correct treatment;       Reviewed scheduled/upcoming provider appointments including: primary care provider 10/29/22 at 910 am;         Review of patient status, including review of consultants reports, relevant laboratory and other test results, and medications completed;       Advised patient to discuss any issues with blood sugar, medications with  provider;      RN care manager reinforced to patient that he needs to contact pharmacist for medication samples before he runs out of medication  Symptom Management: Take medications as prescribed   Attend all scheduled provider appointments Call pharmacy for medication refills 3-7 days in advance of running out of medications Attend church or other social activities Perform all self care activities independently  Perform IADL's (shopping, preparing meals, housekeeping, managing finances) independently Call provider office for new concerns or questions  check blood sugar at prescribed times: once daily check feet daily for cuts, sores or redness enter blood sugar readings and medication or insulin into daily log take the blood sugar log to all doctor visits take the blood sugar meter to all doctor visits trim toenails straight across drink 6 to 8 glasses of water each day fill half of plate with vegetables limit fast food meals to no more than 1 per week manage portion size prepare main meal at home 3 to 5 days each week read food labels for fat, fiber, carbohydrates and portion size keep feet up while sitting wash and dry feet carefully every day wear comfortable, cotton socks wear comfortable, well-fitting shoes Please contact pharmacist at primary care provider when you need medication samples Follow up with primary care provider on 10/29/22 at 910 am  Follow Up Plan: Telephone follow up appointment with care management team member scheduled for:  11/26/22 at 215 pm  CCM (HYPERTENSION) EXPECTED OUTCOME: MONITOR, SELF-MANAGE AND REDUCE SYMPTOMS OF HYPERTENSION       Current Barriers:  Knowledge Deficits related to Hypertension management Chronic Disease Management support and education needs related to Hypertension, diet and exercise No Advanced Directives in place- pt requests documents be mailed Patient reports he lives alone, has nephew that lives close by he can call on  if needed, works part time at [employer] and has people there that assist him if needed. Patient reports he checks blood pressure on occasion and states " readings are good" Patient states he decided he did not want to talk with psychiatrist (referral was done) Patient states he is not taking any medication for depression (celexa, seroquel) not taking hydroxyzine because of the side effects of all these medications (MD aware),  pt is using melatonin for sleep. Patient reports overall he feels he is doing well   Planned Interventions: Evaluation of current treatment plan related to hypertension self management and patient's adherence to plan as established by provider;   Reviewed medications with patient and discussed importance of compliance;  Counseled on the importance of exercise goals with target of 150 minutes per week Advised patient, providing education and rationale, to monitor blood pressure daily and record, calling PCP for findings outside established parameters;  Reviewed scheduled/upcoming provider appointments including:  Advised patient to discuss medication side effects or any issues with medications with provider; Discussed complications of poorly controlled blood pressure such as heart disease, stroke, circulatory complications, vision complications, kidney impairment, sexual dysfunction;  Reinforced low sodium diet and healthy food choices  Symptom Management: Take medications as prescribed   Attend all scheduled provider appointments Call pharmacy for medication refills 3-7 days in advance of running out of medications Attend church or other social activities Perform all self care activities independently  Perform IADL's (shopping, preparing meals, housekeeping, managing finances) independently Call provider office for new concerns or questions  check blood pressure weekly choose a place to take my blood pressure (home, clinic or office, retail store) write  blood pressure results in a log or diary learn about high blood pressure keep a blood pressure log take blood pressure log to all doctor appointments keep all doctor appointments take medications for blood pressure exactly as prescribed begin an exercise program report new symptoms to your doctor eat more whole grains, fruits and vegetables, lean meats and healthy fats Follow low sodium diet- read labels for sodium content Limit/ avoid fast food Please talk to your doctor if you are having any unwanted side effects of medications  Follow Up Plan: Telephone follow up appointment with care management team member scheduled for:   11/26/22 at 215 pm          Plan:Telephone follow up appointment with care management team member scheduled for:  11/26/22 at 215 pm  Jacqlyn Larsen [facility], BSN RN Case Manager [place] [phone]

## 2022-09-07 DIAGNOSIS — E1159 Type 2 diabetes mellitus with other circulatory complications: Secondary | ICD-10-CM

## 2022-09-07 DIAGNOSIS — I10 Essential (primary) hypertension: Secondary | ICD-10-CM

## 2022-09-07 DIAGNOSIS — Z794 Long term (current) use of insulin: Secondary | ICD-10-CM | POA: Diagnosis not present

## 2022-09-11 DIAGNOSIS — M79676 Pain in unspecified toe(s): Secondary | ICD-10-CM | POA: Diagnosis not present

## 2022-09-11 DIAGNOSIS — L84 Corns and callosities: Secondary | ICD-10-CM | POA: Diagnosis not present

## 2022-09-11 DIAGNOSIS — B351 Tinea unguium: Secondary | ICD-10-CM | POA: Diagnosis not present

## 2022-09-11 DIAGNOSIS — E1142 Type 2 diabetes mellitus with diabetic polyneuropathy: Secondary | ICD-10-CM | POA: Diagnosis not present

## 2022-10-13 ENCOUNTER — Ambulatory Visit: Payer: No Typology Code available for payment source | Admitting: Family Medicine

## 2022-10-15 ENCOUNTER — Ambulatory Visit: Payer: No Typology Code available for payment source | Admitting: Family Medicine

## 2022-10-29 ENCOUNTER — Encounter: Payer: Self-pay | Admitting: Family Medicine

## 2022-10-29 ENCOUNTER — Ambulatory Visit (INDEPENDENT_AMBULATORY_CARE_PROVIDER_SITE_OTHER): Payer: No Typology Code available for payment source | Admitting: Family Medicine

## 2022-10-29 VITALS — BP 121/57 | HR 95 | Ht 69.0 in | Wt 180.0 lb

## 2022-10-29 DIAGNOSIS — I10 Essential (primary) hypertension: Secondary | ICD-10-CM

## 2022-10-29 DIAGNOSIS — E1169 Type 2 diabetes mellitus with other specified complication: Secondary | ICD-10-CM

## 2022-10-29 DIAGNOSIS — E119 Type 2 diabetes mellitus without complications: Secondary | ICD-10-CM | POA: Diagnosis not present

## 2022-10-29 DIAGNOSIS — E1159 Type 2 diabetes mellitus with other circulatory complications: Secondary | ICD-10-CM

## 2022-10-29 DIAGNOSIS — I152 Hypertension secondary to endocrine disorders: Secondary | ICD-10-CM

## 2022-10-29 DIAGNOSIS — E782 Mixed hyperlipidemia: Secondary | ICD-10-CM

## 2022-10-29 DIAGNOSIS — Z794 Long term (current) use of insulin: Secondary | ICD-10-CM | POA: Diagnosis not present

## 2022-10-29 LAB — BAYER DCA HB A1C WAIVED: HB A1C (BAYER DCA - WAIVED): 8.9 % — ABNORMAL HIGH (ref 4.8–5.6)

## 2022-10-29 MED ORDER — DEXCOM G7 RECEIVER DEVI: 1.0000 | Freq: Four times a day (QID) | 1 refills | Status: DC

## 2022-10-29 MED ORDER — DEXCOM G7 SENSOR MISC: 3 refills | Status: DC

## 2022-10-29 MED ORDER — BASAGLAR KWIKPEN 100 UNIT/ML ~~LOC~~ SOPN: 70.0000 [IU] | PEN_INJECTOR | Freq: Every day | SUBCUTANEOUS | 5 refills | Status: DC

## 2022-10-29 MED ORDER — PRAVASTATIN SODIUM 80 MG PO TABS: 80.0000 mg | ORAL_TABLET | Freq: Every day | ORAL | 3 refills | Status: DC

## 2022-10-29 MED ORDER — DEXCOM G7 SENSOR MISC: 1.0000 | 3 refills | Status: DC

## 2022-10-29 MED ORDER — ACCU-CHEK GUIDE VI STRP: ORAL_STRIP | 12 refills | Status: AC

## 2022-10-29 NOTE — Addendum Note (Signed)
Addended by: Julious Payer D on: 10/29/2022 10:15 AM   Modules accepted: Orders

## 2022-10-29 NOTE — Progress Notes (Signed)
BP (!) 121/57   Pulse 95   Ht 5\' 9"  (1.753 m)   Wt 180 lb (81.6 kg)   SpO2 97%   BMI 26.58 kg/m    Subjective:   Patient ID: Randy [name], male    DOB: 1951-06-03, 72 y.o.   MRN: [id number]  HPI: Randy [name] is a 72 y.o. male presenting on 10/29/2022 for Medical Management of Chronic Issues, Diabetes, and Depression   HPI Type 2 diabetes mellitus Patient comes in today for recheck of his diabetes. Patient has been currently taking patient admits that he is not really checking his blood sugar hardly at all.  He said he had libre sensor at 1 point but then does not have it currently.  Takes Basaglar 70 and Humalog 10 units 3 times daily but admits that he is not frequently taking the Humalog in the mornings and sometimes misses it throughout the day as well.  Also uses Trulicity. Patient is currently on an ACE inhibitor/ARB. Patient has not seen an ophthalmologist this year. Patient denies any new issues with their feet, does see podiatry. The symptom started onset as an adult atherosclerosis and hypertension and hyperlipidemia ARE RELATED TO DM   Hypertension Patient is currently on amlodipine and hydrochlorothiazide and losartan, and their blood pressure today is 121/57. Patient denies any lightheadedness or dizziness. Patient denies headaches, blurred vision, chest pains, shortness of breath, or weakness. Denies any side effects from medication and is content with current medication.   Hyperlipidemia Patient is coming in for recheck of his hyperlipidemia. The patient is currently taking pravastatin. They deny any issues with myalgias or history of liver damage from it. They deny any focal numbness or weakness or chest pain.   Relevant past medical, surgical, family and social history reviewed and updated as indicated. Interim medical history since our last visit reviewed. Allergies and medications reviewed and updated.  Review of Systems  Constitutional:  Negative for chills  and fever.  Eyes:  Negative for visual disturbance.  Respiratory:  Negative for shortness of breath and wheezing.   Cardiovascular:  Negative for chest pain and leg swelling.  Musculoskeletal:  Negative for back pain and gait problem.  Skin:  Negative for rash.  Neurological:  Negative for dizziness, weakness and light-headedness.  All other systems reviewed and are negative.   Per HPI unless specifically indicated above   Allergies as of 10/29/2022   No Known Allergies      Medication List        Accurate as of Oct 29, 2022  9:39 AM. If you have any questions, ask your nurse or doctor.          Accu-Chek Guide test strip Generic drug: glucose blood Use as instructed to test blood sugar daily. DX: E11.65 (patient using guide meter now)   amLODipine 10 MG tablet Commonly known as: NORVASC Take 1 tablet (10 mg total) by mouth daily.   aspirin EC 81 MG tablet Take 81 mg by mouth daily.   Basaglar KwikPen 100 UNIT/ML Inject 70 Units into the skin daily.   cilostazol 100 MG tablet Commonly known as: PLETAL Take 1 tablet (100 mg total) by mouth 2 (two) times daily.   citalopram 40 MG tablet Commonly known as: CELEXA Take 1 tablet (40 mg total) by mouth daily.   Dexcom G7 Receiver Devi 1 each by Does not apply route 4 (four) times daily. Started by: Nils Pyle, MD   FreeStyle Libre 2 Sensor Misc  Use to check blood sugar 4 times daily. Change sensor every 14 days. DX: E11.9 What changed: Another medication with the same name was added. Make sure you understand how and when to take each. Changed by: Nils Pyle, MD   Dexcom G7 Sensor Misc 1 each by Does not apply route every 14 (fourteen) days. What changed: You were already taking a medication with the same name, and this prescription was added. Make sure you understand how and when to take each. Changed by: Elige Radon Rica Heather, MD   glucose monitoring kit monitoring kit 1 each by Does not apply  route 2 (two) times daily before a meal.   GNP UltiCare Pen Needles 32G X 6 MM Misc Generic drug: Insulin Pen Needle Use 3 times daily DX: E11.65   hydrochlorothiazide 25 MG tablet Commonly known as: HYDRODIURIL Take 1 tablet (25 mg total) by mouth daily.   hydrOXYzine 50 MG capsule Commonly known as: VISTARIL Take 1 capsule (50 mg total) by mouth 3 (three) times daily as needed.   insulin lispro 100 UNIT/ML KwikPen Commonly known as: HumaLOG KwikPen Inject 10 Units into the skin 3 (three) times daily with meals.   lidocaine 5 % ointment Commonly known as: XYLOCAINE Apply 1 application topically 3 (three) times daily as needed.   losartan 100 MG tablet Commonly known as: COZAAR Take 1 tablet (100 mg total) by mouth daily.   Potassium Chloride ER 20 MEQ Tbcr Take 20 mEq by mouth daily.   pravastatin 80 MG tablet Commonly known as: PRAVACHOL Take 1 tablet (80 mg total) by mouth daily. What changed: how much to take Changed by: Elige Radon Hitomi Slape, MD   QUEtiapine 50 MG tablet Commonly known as: SEROquel Take 1 tablet (50 mg total) by mouth at bedtime.   Trulicity 4.5 MG/0.5ML Sopn Generic drug: Dulaglutide Inject 4.5 mg as directed once a week. DX: E11.65         Objective:   BP (!) 121/57   Pulse 95   Ht 5\' 9"  (1.753 m)   Wt 180 lb (81.6 kg)   SpO2 97%   BMI 26.58 kg/m   Wt Readings from Last 3 Encounters:  10/29/22 180 lb (81.6 kg)  07/09/22 177 lb (80.3 kg)  05/26/22 180 lb (81.6 kg)    Physical Exam Vitals and nursing note reviewed.  Constitutional:      General: He is not in acute distress.    Appearance: He is well-developed. He is not diaphoretic.  Eyes:     General: No scleral icterus.    Conjunctiva/sclera: Conjunctivae normal.  Neck:     Thyroid: No thyromegaly.  Cardiovascular:     Rate and Rhythm: Normal rate and regular rhythm.     Heart sounds: Normal heart sounds. No murmur heard. Pulmonary:     Effort: Pulmonary effort is  normal. No respiratory distress.     Breath sounds: Normal breath sounds. No wheezing.  Musculoskeletal:        General: No swelling. Normal range of motion.     Cervical back: Neck supple.  Lymphadenopathy:     Cervical: No cervical adenopathy.  Skin:    General: Skin is warm and dry.     Findings: No rash.  Neurological:     Mental Status: He is alert and oriented to person, place, and time.     Coordination: Coordination normal.  Psychiatric:        Behavior: Behavior normal.       Assessment &  Plan:   Problem List Items Addressed This Visit       Cardiovascular and Mediastinum   Essential hypertension, benign   Relevant Medications   pravastatin (PRAVACHOL) 80 MG tablet     Endocrine   Type 2 diabetes mellitus with other specified complication (HCC)   Relevant Medications   glucose blood (ACCU-CHEK GUIDE) test strip   Insulin Glargine (BASAGLAR KWIKPEN) 100 UNIT/ML   pravastatin (PRAVACHOL) 80 MG tablet   Continuous Glucose Sensor (DEXCOM G7 SENSOR) MISC   Continuous Glucose Receiver (DEXCOM G7 RECEIVER) DEVI     Other   Mixed hyperlipidemia   Relevant Medications   pravastatin (PRAVACHOL) 80 MG tablet   Other Visit Diagnoses     Type 2 diabetes mellitus without complication, without long-term current use of insulin (HCC)    -  Primary   Relevant Medications   glucose blood (ACCU-CHEK GUIDE) test strip   Insulin Glargine (BASAGLAR KWIKPEN) 100 UNIT/ML   pravastatin (PRAVACHOL) 80 MG tablet   Continuous Glucose Sensor (DEXCOM G7 SENSOR) MISC   Continuous Glucose Receiver (DEXCOM G7 RECEIVER) DEVI   Other Relevant Orders   Bayer DCA Hb A1c Waived       A1c back up again at 8.9.  Was 8.4 last time.  Patient's blood sugars uncontrolled and is not checking because he does not like to prick his finger.  He did have a freestyle libre in the past but said that when he worked on his arm sometimes above  Follow-up.  Will try for Dexcom.  Patient does take 4  times a day insulin. Follow up plan: Return in about 3 months (around 01/29/2023), or if symptoms worsen or fail to improve, for Diabetes recheck, also appointment with Raynelle Fanning in 2 to 3 weeks.  Counseling provided for all of the vaccine components Orders Placed This Encounter  Procedures   Bayer DCA Hb A1c Waived    Arville Care, MD [facility] 10/29/2022, 9:39 AM

## 2022-10-29 NOTE — Progress Notes (Signed)
Changed Dexcom directions to Q 10 days and resent to pharmacy

## 2022-11-04 ENCOUNTER — Encounter (HOSPITAL_BASED_OUTPATIENT_CLINIC_OR_DEPARTMENT_OTHER): Payer: Self-pay

## 2022-11-04 ENCOUNTER — Emergency Department (HOSPITAL_BASED_OUTPATIENT_CLINIC_OR_DEPARTMENT_OTHER): Payer: No Typology Code available for payment source

## 2022-11-04 ENCOUNTER — Observation Stay (HOSPITAL_COMMUNITY): Payer: No Typology Code available for payment source

## 2022-11-04 ENCOUNTER — Other Ambulatory Visit: Payer: Self-pay

## 2022-11-04 ENCOUNTER — Observation Stay (HOSPITAL_BASED_OUTPATIENT_CLINIC_OR_DEPARTMENT_OTHER)
Admission: EM | Admit: 2022-11-04 | Discharge: 2022-11-05 | Disposition: A | Discharge status: Home / Self Care | Payer: No Typology Code available for payment source | Attending: Family Medicine | Admitting: Family Medicine

## 2022-11-04 DIAGNOSIS — R41841 Cognitive communication deficit: Secondary | ICD-10-CM | POA: Diagnosis not present

## 2022-11-04 DIAGNOSIS — E1169 Type 2 diabetes mellitus with other specified complication: Secondary | ICD-10-CM | POA: Diagnosis not present

## 2022-11-04 DIAGNOSIS — R200 Anesthesia of skin: Secondary | ICD-10-CM | POA: Diagnosis not present

## 2022-11-04 DIAGNOSIS — G459 Transient cerebral ischemic attack, unspecified: Secondary | ICD-10-CM

## 2022-11-04 DIAGNOSIS — Z794 Long term (current) use of insulin: Secondary | ICD-10-CM | POA: Diagnosis not present

## 2022-11-04 DIAGNOSIS — I10 Essential (primary) hypertension: Secondary | ICD-10-CM | POA: Diagnosis not present

## 2022-11-04 DIAGNOSIS — I152 Hypertension secondary to endocrine disorders: Secondary | ICD-10-CM | POA: Diagnosis present

## 2022-11-04 DIAGNOSIS — E782 Mixed hyperlipidemia: Secondary | ICD-10-CM | POA: Diagnosis present

## 2022-11-04 DIAGNOSIS — I639 Cerebral infarction, unspecified: Secondary | ICD-10-CM | POA: Diagnosis not present

## 2022-11-04 DIAGNOSIS — Z87891 Personal history of nicotine dependence: Secondary | ICD-10-CM | POA: Insufficient documentation

## 2022-11-04 DIAGNOSIS — E876 Hypokalemia: Secondary | ICD-10-CM | POA: Diagnosis present

## 2022-11-04 DIAGNOSIS — Z7982 Long term (current) use of aspirin: Secondary | ICD-10-CM | POA: Insufficient documentation

## 2022-11-04 DIAGNOSIS — E1165 Type 2 diabetes mellitus with hyperglycemia: Secondary | ICD-10-CM | POA: Insufficient documentation

## 2022-11-04 DIAGNOSIS — Z79899 Other long term (current) drug therapy: Secondary | ICD-10-CM | POA: Diagnosis not present

## 2022-11-04 DIAGNOSIS — R739 Hyperglycemia, unspecified: Secondary | ICD-10-CM

## 2022-11-04 LAB — URINALYSIS, COMPLETE (UACMP) WITH MICROSCOPIC
Bacteria, UA: NONE SEEN
Bilirubin Urine: NEGATIVE
Glucose, UA: 150 mg/dL — AB
Ketones, ur: NEGATIVE mg/dL
Leukocytes,Ua: NEGATIVE
Nitrite: NEGATIVE
Protein, ur: NEGATIVE mg/dL
Specific Gravity, Urine: 1.005 (ref 1.005–1.030)
pH: 7 (ref 5.0–8.0)

## 2022-11-04 LAB — CBC
HCT: 38.3 % — ABNORMAL LOW (ref 39.0–52.0)
Hemoglobin: 14 g/dL (ref 13.0–17.0)
MCH: 34 pg (ref 26.0–34.0)
MCHC: 36.6 g/dL — ABNORMAL HIGH (ref 30.0–36.0)
MCV: 93 fL (ref 80.0–100.0)
Platelets: 201 10*3/uL (ref 150–400)
RBC: 4.12 MIL/uL — ABNORMAL LOW (ref 4.22–5.81)
RDW: 12.7 % (ref 11.5–15.5)
WBC: 9.7 10*3/uL (ref 4.0–10.5)
nRBC: 0 % (ref 0.0–0.2)

## 2022-11-04 LAB — CBC WITH DIFFERENTIAL/PLATELET
Abs Immature Granulocytes: 0.05 10*3/uL (ref 0.00–0.07)
Basophils Absolute: 0.1 10*3/uL (ref 0.0–0.1)
Basophils Relative: 1 %
Eosinophils Absolute: 0.2 10*3/uL (ref 0.0–0.5)
Eosinophils Relative: 2 %
HCT: 43.7 % (ref 39.0–52.0)
Hemoglobin: 15.8 g/dL (ref 13.0–17.0)
Immature Granulocytes: 1 %
Lymphocytes Relative: 18 %
Lymphs Abs: 1.9 10*3/uL (ref 0.7–4.0)
MCH: 33.4 pg (ref 26.0–34.0)
MCHC: 36.2 g/dL — ABNORMAL HIGH (ref 30.0–36.0)
MCV: 92.4 fL (ref 80.0–100.0)
Monocytes Absolute: 0.8 10*3/uL (ref 0.1–1.0)
Monocytes Relative: 7 %
Neutro Abs: 7.8 10*3/uL — ABNORMAL HIGH (ref 1.7–7.7)
Neutrophils Relative %: 71 %
Platelets: 220 10*3/uL (ref 150–400)
RBC: 4.73 MIL/uL (ref 4.22–5.81)
RDW: 12.4 % (ref 11.5–15.5)
WBC: 10.8 10*3/uL — ABNORMAL HIGH (ref 4.0–10.5)
nRBC: 0 % (ref 0.0–0.2)

## 2022-11-04 LAB — DIFFERENTIAL
Abs Immature Granulocytes: 0.03 10*3/uL (ref 0.00–0.07)
Basophils Absolute: 0 10*3/uL (ref 0.0–0.1)
Basophils Relative: 0 %
Eosinophils Absolute: 0.1 10*3/uL (ref 0.0–0.5)
Eosinophils Relative: 1 %
Immature Granulocytes: 0 %
Lymphocytes Relative: 17 %
Lymphs Abs: 1.7 10*3/uL (ref 0.7–4.0)
Monocytes Absolute: 0.7 10*3/uL (ref 0.1–1.0)
Monocytes Relative: 7 %
Neutro Abs: 7.2 10*3/uL (ref 1.7–7.7)
Neutrophils Relative %: 75 %

## 2022-11-04 LAB — ETHANOL
Alcohol, Ethyl (B): <10 mg/dL (ref <10)
Alcohol, Ethyl (B): <10 mg/dL (ref <10)

## 2022-11-04 LAB — COMPREHENSIVE METABOLIC PANEL
ALT: 12 U/L (ref 0–44)
ALT: 18 U/L (ref 0–44)
AST: 13 U/L — ABNORMAL LOW (ref 15–41)
AST: 21 U/L (ref 15–41)
Albumin: 4.4 g/dL (ref 3.5–5.0)
Albumin: 4.2 g/dL (ref 3.5–5.0)
Alkaline Phosphatase: 74 U/L (ref 38–126)
Alkaline Phosphatase: 78 U/L (ref 38–126)
Anion gap: 12 (ref 5–15)
Anion gap: 12 (ref 5–15)
BUN: 29 mg/dL — ABNORMAL HIGH (ref 8–23)
BUN: 19 mg/dL (ref 8–23)
CO2: 25 mmol/L (ref 22–32)
CO2: 25 mmol/L (ref 22–32)
Calcium: 9 mg/dL (ref 8.9–10.3)
Calcium: 9.5 mg/dL (ref 8.9–10.3)
Chloride: 96 mmol/L — ABNORMAL LOW (ref 98–111)
Chloride: 99 mmol/L (ref 98–111)
Creatinine, Ser: 1.67 mg/dL — ABNORMAL HIGH (ref 0.61–1.24)
Creatinine, Ser: 1.22 mg/dL (ref 0.61–1.24)
GFR, Estimated: 44 mL/min — ABNORMAL LOW (ref >60)
GFR, Estimated: >60 mL/min (ref >60)
Glucose, Bld: 401 mg/dL — ABNORMAL HIGH (ref 70–99)
Glucose, Bld: 254 mg/dL — ABNORMAL HIGH (ref 70–99)
Potassium: 3.4 mmol/L — ABNORMAL LOW (ref 3.5–5.1)
Potassium: 3.4 mmol/L — ABNORMAL LOW (ref 3.5–5.1)
Sodium: 133 mmol/L — ABNORMAL LOW (ref 135–145)
Sodium: 136 mmol/L (ref 135–145)
Total Bilirubin: 0.6 mg/dL (ref 0.3–1.2)
Total Bilirubin: 0.9 mg/dL (ref 0.3–1.2)
Total Protein: 6.5 g/dL (ref 6.5–8.1)
Total Protein: 7.1 g/dL (ref 6.5–8.1)

## 2022-11-04 LAB — RETICULOCYTES
Immature Retic Fract: 11.6 % (ref 2.3–15.9)
RBC.: 4.7 MIL/uL (ref 4.22–5.81)
Retic Count, Absolute: 105.8 10*3/uL (ref 19.0–186.0)
Retic Ct Pct: 2.3 % (ref 0.4–3.1)

## 2022-11-04 LAB — PHOSPHORUS: Phosphorus: 3.1 mg/dL (ref 2.5–4.6)

## 2022-11-04 LAB — RAPID URINE DRUG SCREEN, HOSP PERFORMED
Amphetamines: NOT DETECTED
Barbiturates: NOT DETECTED
Benzodiazepines: NOT DETECTED
Cocaine: NOT DETECTED
Opiates: NOT DETECTED
Tetrahydrocannabinol: NOT DETECTED

## 2022-11-04 LAB — LIPID PANEL
Cholesterol: 189 mg/dL (ref 0–200)
HDL: 50 mg/dL (ref >40)
LDL Cholesterol: 105 mg/dL — ABNORMAL HIGH (ref 0–99)
Total CHOL/HDL Ratio: 3.8 RATIO
Triglycerides: 172 mg/dL — ABNORMAL HIGH (ref <150)
VLDL: 34 mg/dL (ref 0–40)

## 2022-11-04 LAB — APTT: aPTT: 25 seconds (ref 24–36)

## 2022-11-04 LAB — IRON AND TIBC
Iron: 121 ug/dL (ref 45–182)
Saturation Ratios: 31 % (ref 17.9–39.5)
TIBC: 392 ug/dL (ref 250–450)
UIBC: 271 ug/dL

## 2022-11-04 LAB — FERRITIN: Ferritin: 181 ng/mL (ref 24–336)

## 2022-11-04 LAB — SODIUM, URINE, RANDOM: Sodium, Ur: 76 mmol/L

## 2022-11-04 LAB — CBG MONITORING, ED
Glucose-Capillary: 351 mg/dL — ABNORMAL HIGH (ref 70–99)
Glucose-Capillary: 272 mg/dL — ABNORMAL HIGH (ref 70–99)

## 2022-11-04 LAB — OSMOLALITY, URINE: Osmolality, Ur: 249 mOsm/kg — ABNORMAL LOW (ref 300–900)

## 2022-11-04 LAB — GLUCOSE, CAPILLARY
Glucose-Capillary: 208 mg/dL — ABNORMAL HIGH (ref 70–99)
Glucose-Capillary: 242 mg/dL — ABNORMAL HIGH (ref 70–99)

## 2022-11-04 LAB — MAGNESIUM: Magnesium: 1.9 mg/dL (ref 1.7–2.4)

## 2022-11-04 LAB — PROTIME-INR
INR: 1 (ref 0.8–1.2)
Prothrombin Time: 13.1 seconds (ref 11.4–15.2)

## 2022-11-04 LAB — FOLATE: Folate: 18.9 ng/mL (ref >5.9)

## 2022-11-04 LAB — CREATININE, URINE, RANDOM: Creatinine, Urine: 23 mg/dL

## 2022-11-04 LAB — VITAMIN B12: Vitamin B-12: 232 pg/mL (ref 180–914)

## 2022-11-04 LAB — TSH: TSH: 2.117 u[IU]/mL (ref 0.350–4.500)

## 2022-11-04 LAB — OSMOLALITY: Osmolality: 300 mOsm/kg — ABNORMAL HIGH (ref 275–295)

## 2022-11-04 MED ORDER — SODIUM CHLORIDE 0.9 % IV SOLN
1000.0000 mL | INTRAVENOUS | Status: DC
  Administered 2022-11-04: 1000 mL via INTRAVENOUS

## 2022-11-04 MED ORDER — CITALOPRAM HYDROBROMIDE 40 MG PO TABS
40.0000 mg | ORAL_TABLET | Freq: Every day | ORAL | Status: DC
  Administered 2022-11-05: 40 mg via ORAL
  Filled 2022-11-04: qty 1

## 2022-11-04 MED ORDER — ACETAMINOPHEN 650 MG RE SUPP: 650.0000 mg | RECTAL | Status: DC | PRN

## 2022-11-04 MED ORDER — PRAVASTATIN SODIUM 40 MG PO TABS: 80.0000 mg | ORAL_TABLET | Freq: Every day | ORAL | Status: DC

## 2022-11-04 MED ORDER — ASPIRIN 81 MG PO TBEC
81.0000 mg | DELAYED_RELEASE_TABLET | Freq: Every day | ORAL | Status: DC
  Administered 2022-11-05: 81 mg via ORAL
  Filled 2022-11-04: qty 1

## 2022-11-04 MED ORDER — INSULIN GLARGINE-YFGN 100 UNIT/ML ~~LOC~~ SOLN
60.0000 [IU] | Freq: Every day | SUBCUTANEOUS | Status: DC
  Administered 2022-11-05: 60 [IU] via SUBCUTANEOUS
  Filled 2022-11-04: qty 0.6

## 2022-11-04 MED ORDER — INSULIN ASPART 100 UNIT/ML IJ SOLN
10.0000 [IU] | Freq: Once | INTRAMUSCULAR | Status: AC
  Administered 2022-11-04: 10 [IU] via SUBCUTANEOUS

## 2022-11-04 MED ORDER — SODIUM CHLORIDE 0.9 % IV SOLN: INTRAVENOUS | Status: DC

## 2022-11-04 MED ORDER — STROKE: EARLY STAGES OF RECOVERY BOOK
Freq: Once | Status: AC
  Filled 2022-11-04: qty 1

## 2022-11-04 MED ORDER — ACETAMINOPHEN 160 MG/5ML PO SOLN: 650.0000 mg | ORAL | Status: DC | PRN

## 2022-11-04 MED ORDER — QUETIAPINE FUMARATE 50 MG PO TABS
50.0000 mg | ORAL_TABLET | Freq: Every day | ORAL | Status: DC
  Administered 2022-11-04: 50 mg via ORAL
  Filled 2022-11-04: qty 1

## 2022-11-04 MED ORDER — ALPRAZOLAM 0.5 MG PO TABS: 0.5000 mg | ORAL_TABLET | ORAL | Status: DC | PRN

## 2022-11-04 MED ORDER — ACETAMINOPHEN 325 MG PO TABS: 650.0000 mg | ORAL_TABLET | ORAL | Status: DC | PRN

## 2022-11-04 MED ORDER — SODIUM CHLORIDE 0.9 % IV BOLUS (SEPSIS)
500.0000 mL | Freq: Once | INTRAVENOUS | Status: AC
  Administered 2022-11-04: 500 mL via INTRAVENOUS

## 2022-11-04 MED ORDER — INSULIN ASPART 100 UNIT/ML IJ SOLN
0.0000 [IU] | INTRAMUSCULAR | Status: DC
  Administered 2022-11-04 – 2022-11-05 (×2): 3 [IU] via SUBCUTANEOUS

## 2022-11-04 NOTE — Assessment & Plan Note (Signed)
Order sliding scale Continue glargine at reduced dose of 60 units nightly

## 2022-11-04 NOTE — ED Notes (Signed)
Attempt to call report.  Floor nurse unavailable.  Will call back later

## 2022-11-04 NOTE — ED Triage Notes (Signed)
Patient here POV from Home.  Endorses Left Leg Numbness that was noted when he awoke at 0430. Worsened progressively since then. Went to sleep at 2200 with no discernable symptoms.   No Known Trauma or Injury.   NAD Noted during triage. A&Ox4. Gcs 15. BIB Wheelchair.

## 2022-11-04 NOTE — Assessment & Plan Note (Signed)
Continue Pravachol 80 mg a day

## 2022-11-04 NOTE — ED Notes (Signed)
Kiana with cl called for transport 

## 2022-11-04 NOTE — ED Notes (Signed)
[facility] at bedside 

## 2022-11-04 NOTE — ED Provider Notes (Signed)
[place] EMERGENCY DEPARTMENT AT [facility] Provider Note   CSN: [id number] Arrival date & time: 11/04/22  1337     History  Chief Complaint  Patient presents with   Numbness    Randy [name] is a 72 y.o. male.  HPI   Patient presents ED with complaints of left leg heaviness and weakness.  Patient states he woke up this morning at about 5 and felt fine.  He was at work at around 7 AM when he noticed the weakness and numbness in his left leg.  Patient states his leg continues to feel heavy.  The numbness is a little less severe.  Patient does have a history of hypertension and diabetes.  He denies any previous symptoms like this before  Home Medications Prior to Admission medications   Medication Sig Start Date End Date Taking? Authorizing Provider  amLODipine (NORVASC) 10 MG tablet Take 1 tablet (10 mg total) by mouth daily. 04/02/22   Dettinger, Elige Radon, MD  aspirin EC 81 MG tablet Take 81 mg by mouth daily.    [provider]  cilostazol (PLETAL) 100 MG tablet Take 1 tablet (100 mg total) by mouth 2 (two) times daily. 04/02/22   Dettinger, Elige Radon, MD  citalopram (CELEXA) 40 MG tablet Take 1 tablet (40 mg total) by mouth daily. 05/05/22   Dettinger, Elige Radon, MD  Continuous Glucose Receiver (DEXCOM G7 RECEIVER) DEVI 1 each by Does not apply route 4 (four) times daily. 10/29/22   Dettinger, Elige Radon, MD  Continuous Glucose Sensor (DEXCOM G7 SENSOR) MISC Change every 10 days 10/29/22   Dettinger, Elige Radon, MD  Dulaglutide (TRULICITY) 4.5 MG/0.5ML SOPN Inject 4.5 mg as directed once a week. DX: E11.65 05/09/22   Dettinger, Elige Radon, MD  glucose blood (ACCU-CHEK GUIDE) test strip Use as instructed to test blood sugar daily. DX: E11.65 (patient using guide meter now) 10/29/22   Dettinger, Elige Radon, MD  glucose monitoring kit (FREESTYLE) monitoring kit 1 each by Does not apply route 2 (two) times daily before a meal. 05/15/21   Dettinger, Elige Radon, MD   hydrochlorothiazide (HYDRODIURIL) 25 MG tablet Take 1 tablet (25 mg total) by mouth daily. 04/02/22   Dettinger, Elige Radon, MD  hydrOXYzine (VISTARIL) 50 MG capsule Take 1 capsule (50 mg total) by mouth 3 (three) times daily as needed. 05/05/22   Dettinger, Elige Radon, MD  Insulin Glargine (BASAGLAR KWIKPEN) 100 UNIT/ML Inject 70 Units into the skin daily. 10/29/22   Dettinger, Elige Radon, MD  insulin lispro (HUMALOG KWIKPEN) 100 UNIT/ML KwikPen Inject 10 Units into the skin 3 (three) times daily with meals. 05/09/22   Dettinger, Elige Radon, MD  Insulin Pen Needle (GNP ULTICARE PEN NEEDLES) 32G X 6 MM MISC Use 3 times daily DX: E11.65 05/22/22   Dettinger, Elige Radon, MD  lidocaine (XYLOCAINE) 5 % ointment Apply 1 application topically 3 (three) times daily as needed. 02/06/21   Dettinger, Elige Radon, MD  losartan (COZAAR) 100 MG tablet Take 1 tablet (100 mg total) by mouth daily. 04/02/22   Dettinger, Elige Radon, MD  Potassium Chloride ER 20 MEQ TBCR Take 20 mEq by mouth daily.     [provider]  pravastatin (PRAVACHOL) 80 MG tablet Take 1 tablet (80 mg total) by mouth daily. 10/29/22   Dettinger, Elige Radon, MD  QUEtiapine (SEROQUEL) 50 MG tablet Take 1 tablet (50 mg total) by mouth at bedtime. 05/05/22   Dettinger, Elige Radon, MD  Allergies    Patient has no known allergies.    Review of Systems   Review of Systems  Physical Exam Updated Vital Signs BP (!) 145/86 (BP Location: Right Arm)   Pulse (!) 114   Temp 98 F (36.7 C) (Oral)   Resp 18   Ht 1.753 m (5\' 9" )   Wt 81.6 kg   SpO2 98%   BMI 26.58 kg/m  Physical Exam Vitals and nursing note reviewed.  Constitutional:      General: He is not in acute distress.    Appearance: He is well-developed.  HENT:     Head: Normocephalic and atraumatic.     Right Ear: External ear normal.     Left Ear: External ear normal.  Eyes:     General: No visual field deficit or scleral icterus.       Right eye: No discharge.        Left eye: No  discharge.     Conjunctiva/sclera: Conjunctivae normal.  Neck:     Trachea: No tracheal deviation.  Cardiovascular:     Rate and Rhythm: Regular rhythm. Tachycardia present.  Pulmonary:     Effort: Pulmonary effort is normal. No respiratory distress.     Breath sounds: Normal breath sounds. No stridor. No wheezing or rales.  Abdominal:     General: Bowel sounds are normal. There is no distension.     Palpations: Abdomen is soft.     Tenderness: There is no abdominal tenderness. There is no guarding or rebound.  Musculoskeletal:        General: No tenderness.     Cervical back: Neck supple.  Skin:    General: Skin is warm and dry.     Findings: No rash.  Neurological:     Mental Status: He is alert and oriented to person, place, and time.     Cranial Nerves: No cranial nerve deficit, dysarthria or facial asymmetry.     Sensory: No sensory deficit.     Motor: No abnormal muscle tone, seizure activity or pronator drift.     Coordination: Coordination normal.     Comments: Unable to hold left leg off bed for 5 seconds, sensation intact in all extremities,  no left or right sided neglect, normal finger-nose exam bilaterally, no nystagmus noted   Psychiatric:        Mood and Affect: Mood normal.     ED Results / Procedures / Treatments   Labs (all labs ordered are listed, but only abnormal results are displayed) Labs Reviewed  CBC - Abnormal; Notable for the following components:      Result Value   RBC 4.12 (*)    HCT 38.3 (*)    MCHC 36.6 (*)    All other components within normal limits  COMPREHENSIVE METABOLIC PANEL - Abnormal; Notable for the following components:   Sodium 133 (*)    Potassium 3.4 (*)    Chloride 96 (*)    Glucose, Bld 401 (*)    BUN 29 (*)    Creatinine, Ser 1.67 (*)    AST 13 (*)    GFR, Estimated 44 (*)    All other components within normal limits  CBG MONITORING, ED - Abnormal; Notable for the following components:   Glucose-Capillary 351 (*)     All other components within normal limits  PROTIME-INR  APTT  DIFFERENTIAL  ETHANOL    EKG EKG Interpretation  Date/Time:  Tuesday Nov 04 2022 13:51:13 EDT Ventricular Rate:  114 PR Interval:  192 QRS Duration: 99 QT Interval:  319 QTC Calculation: 440 R Axis:   49 Text Interpretation: Sinus tachycardia Borderline T abnormalities, diffuse leads Baseline wander in lead(s) V4 Since last tracing rate faster Confirmed by Linwood Dibbles [phone]) on 11/04/2022 1:54:14 PM  Radiology CT HEAD WO CONTRAST  Result Date: 11/04/2022 CLINICAL DATA:  Neuro deficit, acute, stroke suspected. Left leg numbness. EXAM: CT HEAD WITHOUT CONTRAST TECHNIQUE: Contiguous axial images were obtained from the base of the skull through the vertex without intravenous contrast. RADIATION DOSE REDUCTION: This exam was performed according to the departmental dose-optimization program which includes automated exposure control, adjustment of the mA and/or kV according to patient size and/or use of iterative reconstruction technique. COMPARISON:  None Available. FINDINGS: Brain: No acute hemorrhage. 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 Cortical gray-white differentiation is preserved. No hydrocephalus or extra-axial collection. No mass effect or midline shift. Vascular: No hyperdense vessel or unexpected calcification. Skull: No calvarial fracture or suspicious bone lesion. Skull base is unremarkable. Sinuses/Orbits: Unremarkable. Other: None. IMPRESSION: 1. No acute intracranial abnormality. 2. Moderate chronic small-vessel disease. Electronically Signed   By: Orvan Falconer M.D.   On: 11/04/2022 14:49    Procedures Procedures    Medications Ordered in ED Medications  sodium chloride 0.9 % bolus 500 mL (0 mLs Intravenous Stopped 11/04/22 1520)    Followed by  0.9 %  sodium chloride infusion (1,000 mLs Intravenous New Bag/Given 11/04/22 1512)  insulin  aspart (novoLOG) injection 10 Units (10 Units Subcutaneous Given 11/04/22 1516)    ED Course/ Medical Decision Making/ A&P Clinical Course as of 11/04/22 1532  Tue Nov 04, 2022  1427 CBC normal. [JK]  1510 Hyperglycemia noted.,  No signs of acidosis.  Will give insulin subcu [JK]  1510 CT scan with chronic findings but no definite acute stroke [JK]  1530 Discussed with Dr Alinda Money.  Will accept for admission [JK]    Clinical Course User Index [JK] Linwood Dibbles, MD                             Medical Decision Making Differential diagnosis includes but not limited to, TIA stroke, anemia dehydration  Problems Addressed: Cerebrovascular accident (CVA), unspecified mechanism (HCC): acute illness or injury that poses a threat to life or bodily functions Hyperglycemia: acute illness or injury that poses a threat to life or bodily functions  Amount and/or Complexity of Data Reviewed Labs: ordered. Decision-making details documented in ED Course. Radiology: ordered and independent interpretation performed.  Risk Prescription drug management. Decision regarding hospitalization.   Patient presented to ED with complaints of numbness and weakness.  Patient's symptoms outside acute stroke treatment window.  In the ED patient does have weakness and numbness of his left lower extremity.  Labs are notable for hyperglycemia without signs of DKA.  Patient his complaints and findings on exam I think he needs further evaluation and stroke workup.  Patient will need MRI and neurology consultation.  This is not available at this freestanding ED.  I will consult the medical service to arrange for transfer and admission to [facility]        Final Clinical Impression(s) / ED Diagnoses Final diagnoses:  Cerebrovascular accident (CVA), unspecified mechanism (HCC)  Hyperglycemia    Rx / DC Orders ED Discharge Orders     None  Linwood Dibbles, MD 11/04/22 [phone]

## 2022-11-04 NOTE — ED Notes (Signed)
Handoff report given to [facility] 

## 2022-11-04 NOTE — ED Notes (Addendum)
PT Ambulated with a steady gait and no assistance to the bathroom.

## 2022-11-04 NOTE — Assessment & Plan Note (Signed)
-   will admit based on TIA/CVA protocol, Initial Stroke scale***       Monitor on Tele       MRA/MRI await ***results Resulted - showing acute ischemic CVA***       CTA ***, Carotid Doppler***        Echo to evaluate for possible embolic source,        obtain cardiac enzymes,  ECG,   Lipid panel, TSH.        Order PT/OT evaluation.        Deep nothing by mouth until passes swallow eval ***will need Speech pathology evaluation       Will make sure patient is on antiplatelet ASA 81 ***, 325*** Plavix agent and statin***       Allow permissive Hypertension keep BP <220/120        Neurology consulted Have seen pt in ER*** will see in AM

## 2022-11-04 NOTE — Subjective & Objective (Signed)
Patient presents with left leg numbness noted when he woke up at around 430 this morning has been getting progressively worse since then last time known well at 2200 when he went to sleep denies any trauma or injury Arrived at [facility] also not endorsed left leg heaviness and weakness Noted to be unsteady on his feet CT head unremarkable patient was sent to [facility] for further evaluation

## 2022-11-04 NOTE — Assessment & Plan Note (Signed)
Allow permissive hypertension 

## 2022-11-04 NOTE — ED Notes (Signed)
Patient transported to CT 

## 2022-11-04 NOTE — H&P (Signed)
Randy [name] WGN:[id number] DOB: 1950/07/10 DOA: 11/04/2022     PCP: Dettinger, Elige Radon, MD   Outpatient Specialists:  NONE    Patient arrived to ER on 11/04/22 at 1337 Referred by Attending No att. providers found   Patient coming from:    home Lives alone,       Chief Complaint:   Chief Complaint  Patient presents with   Numbness    HPI: Randy [name] is a 72 y.o. male with medical history significant of Dm2, HLD, HTN    Presented with  left leg numbness and weakness Patient presents with left leg numbness noted when he woke up at around 430 this morning has been getting progressively worse since then last time known well at 2200 when he went to sleep denies any trauma or injury Arrived at [facility] also not endorsed left leg heaviness and weakness Noted to be unsteady on his feet CT head unremarkable patient was sent to [facility] for [facility] evaluation   He reports was staggering around in room Report the weakness now has resolved Weakness lasted about 8 h   Denies significant ETOH intake  Does not smoke  No results found for: "SARSCOV2NAA"      Regarding pertinent Chronic problems:     Hyperlipidemia -  on statins Pravachol (pravastatin)  Lipid Panel     Component Value Date/Time   CHOL 152 07/09/2022 0837   TRIG 106 07/09/2022 0837   HDL 49 07/09/2022 0837   CHOLHDL 3.1 07/09/2022 0837   LDLCALC 84 07/09/2022 0837   LABVLDL 19 07/09/2022 0837     HTN on  amlodipine and hydrochlorothiazide and losartan      DM 2 -  Lab Results  Component Value Date   HGBA1C 8.9 (H) 10/29/2022   on insulin, PO meds only, diet controlled   ***A. Fib -  - CHA2DS2 vas score **** CHA2DS2/VAS Stroke Risk Points      N/A >= 2 Points: High Risk  1 - 1.99 Points: Medium Risk  0 Points: Low Risk    Last Change: N/A      This score determines the patient's risk of having a stroke if the  patient has atrial fibrillation.      This score is not applicable to  this patient. Components are not  calculated.     current  on anticoagulation with ****Coumadin  ***Xarelto,* Eliquis,  *** Not on anticoagulation secondary to Risk of Falls, *** recurrent bleeding         -  Rate control:  Currently controlled with ***Toprolol,  *Metoprolol,* Diltiazem, *Coreg          - Rhythm control: *** amiodarone, *flecainide  While in ER: Clinical Course as of 11/04/22 2004  Tue Nov 04, 2022  1427 CBC normal. [JK]  1510 Hyperglycemia noted.,  No signs of acidosis.  Will give insulin subcu [JK]  1510 CT scan with chronic findings but no definite acute stroke [JK]  1530 Discussed with Dr Alinda Money.  Will accept for admission [JK]    Clinical Course User Index [JK] Linwood Dibbles, MD     Lab Orders         Protime-INR         APTT         CBC         Differential         Comprehensive metabolic panel         Ethanol  Glucose, capillary         CBG monitoring, ED         CBG monitoring, ED      CT HEAD  NON acute    CXR - ***NON acute Following Medications were ordered in ER: Medications  sodium chloride 0.9 % bolus 500 mL (0 mLs Intravenous Stopped 11/04/22 1520)    Followed by  0.9 %  sodium chloride infusion (1,000 mLs Intravenous New Bag/Given 11/04/22 1512)  insulin aspart (novoLOG) injection 10 Units (10 Units Subcutaneous Given 11/04/22 1516)    ______  ED Triage Vitals  Enc Vitals Group     BP 11/04/22 1347 (!) 145/86     Pulse Rate 11/04/22 1347 (!) 114     Resp 11/04/22 1347 18     Temp 11/04/22 1347 98 F (36.7 C)     Temp Source 11/04/22 1347 Oral     SpO2 11/04/22 1347 98 %     Weight 11/04/22 1346 180 lb (81.6 kg)     Height 11/04/22 1346 5\' 9"  (1.753 m)     Head Circumference --      Peak Flow --      Pain Score --      Pain Loc --      Pain Edu? --      Excl. in GC? --   TMAX(24)@     _________________________________________ Significant initial  Findings: Abnormal Labs Reviewed  CBC - Abnormal; Notable for the  following components:      Result Value   RBC 4.12 (*)    HCT 38.3 (*)    MCHC 36.6 (*)    All other components within normal limits  COMPREHENSIVE METABOLIC PANEL - Abnormal; Notable for the following components:   Sodium 133 (*)    Potassium 3.4 (*)    Chloride 96 (*)    Glucose, Bld 401 (*)    BUN 29 (*)    Creatinine, Ser 1.67 (*)    AST 13 (*)    GFR, Estimated 44 (*)    All other components within normal limits  GLUCOSE, CAPILLARY - Abnormal; Notable for the following components:   Glucose-Capillary 208 (*)    All other components within normal limits  CBG MONITORING, ED - Abnormal; Notable for the following components:   Glucose-Capillary 351 (*)    All other components within normal limits  CBG MONITORING, ED - Abnormal; Notable for the following components:   Glucose-Capillary 272 (*)    All other components within normal limits      _________________________ Troponin ***ordered Cardiac Panel (last 3 results) No results for input(s): "CKTOTAL", "CKMB", "TROPONINIHS", "RELINDX" in the last 72 hours.   ECG: Ordered Personally reviewed and interpreted by me showing: HR : 114 Rhythm:Sinus tachycardia Borderline T abnormalities, diffuse leads Baseline wander in lead(s) V4 Since last tracing rate faster QTC 440      The recent clinical data is shown below. Vitals:   11/04/22 1530 11/04/22 1700 11/04/22 1749 11/04/22 1953  BP: (!) 151/92  (!) 164/91 135/77  Pulse: (!) 110  (!) 119 (!) 105  Resp: 14   16  Temp:  98.7 F (37.1 C) 97.9 F (36.6 C) 98.5 F (36.9 C)  TempSrc:  Oral Oral   SpO2: 99%  98% 98%  Weight:      Height:        WBC     Component Value Date/Time   WBC 9.7 11/04/2022 1417   LYMPHSABS 1.7 11/04/2022  1417   LYMPHSABS 1.5 07/09/2022 0837   MONOABS 0.7 11/04/2022 1417   EOSABS 0.1 11/04/2022 1417   EOSABS 0.1 07/09/2022 0837   BASOSABS 0.0 11/04/2022 1417   BASOSABS 0.1 07/09/2022 0837      UA  ordered   Urine analysis: No  results found for: "COLORURINE", "APPEARANCEUR", "LABSPEC", "PHURINE", "GLUCOSEU", "HGBUR", "BILIRUBINUR", "KETONESUR", "PROTEINUR", "UROBILINOGEN", "NITRITE", "LEUKOCYTESUR"  No results found for this or any previous visit.  ABX started Antibiotics Given (last 72 hours)     None       No results found for the last 90 days.   __________________________________________________________ Recent Labs  Lab 11/04/22 1417  NA 133*  K 3.4*  CO2 25  GLUCOSE 401*  BUN 29*  CREATININE 1.67*  CALCIUM 9.0    Cr     Up from baseline see below Lab Results  Component Value Date   CREATININE 1.67 (H) 11/04/2022   CREATININE 1.17 07/09/2022   CREATININE 1.03 11/27/2021    Recent Labs  Lab 11/04/22 1417  AST 13*  ALT 12  ALKPHOS 74  BILITOT 0.6  PROT 6.5  ALBUMIN 4.4   Lab Results  Component Value Date   CALCIUM 9.0 11/04/2022    Plt: Lab Results  Component Value Date   PLT 201 11/04/2022       Recent Labs  Lab 11/04/22 1417  WBC 9.7  NEUTROABS 7.2  HGB 14.0  HCT 38.3*  MCV 93.0  PLT 201    HG/HCT  stable,      Component Value Date/Time   HGB 14.0 11/04/2022 1417   HGB 14.9 07/09/2022 0837   HCT 38.3 (L) 11/04/2022 1417   HCT 44.1 07/09/2022 0837   MCV 93.0 11/04/2022 1417   MCV 98 (H) 07/09/2022 0837    _______________________________________________ Hospitalist was called for admission for   stroke like symptoms   The following Work up has been ordered so far:  Orders Placed This Encounter  Procedures   CT HEAD WO CONTRAST   Protime-INR   APTT   CBC   Differential   Comprehensive metabolic panel   Ethanol   Glucose, capillary   Diet NPO time specified   ED Cardiac monitoring   Swallow screen (NPO until completed)   NIH Stroke Scale   If O2 sat   Cardiac Monitoring Continuous x 48 hours Indications for use: Acute neurological event   Consult to hospitalist   CBG monitoring, ED   CBG monitoring, ED   EKG 12-Lead   ED EKG   EKG   EKG    Place in observation (patient's expected length of stay will be less than 2 midnights)     OTHER Significant initial  Findings:  labs showing:     DM  labs:  HbA1C: Recent Labs    04/02/22 0807 07/09/22 0835 10/29/22 0901  HGBA1C 9.0* 8.4* 8.9*    CBG (last 3)  Recent Labs    11/04/22 1425 11/04/22 1621 11/04/22 1751  GLUCAP 351* 272* 208*          Cultures: No results found for: "SDES", "SPECREQUEST", "CULT", "REPTSTATUS"   Radiological Exams on Admission: CT HEAD WO CONTRAST  Result Date: 11/04/2022 CLINICAL DATA:  Neuro deficit, acute, stroke suspected. Left leg numbness. EXAM: CT HEAD WITHOUT CONTRAST TECHNIQUE: Contiguous axial images were obtained from the base of the skull through the vertex without intravenous contrast. RADIATION DOSE REDUCTION: This exam was performed according to the departmental dose-optimization program which includes automated exposure control,  adjustment of the mA and/or kV according to patient size and/or use of iterative reconstruction technique. COMPARISON:  None Available. FINDINGS: Brain: No acute hemorrhage. 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 Cortical gray-white differentiation is preserved. No hydrocephalus or extra-axial collection. No mass effect or midline shift. Vascular: No hyperdense vessel or unexpected calcification. Skull: No calvarial fracture or suspicious bone lesion. Skull base is unremarkable. Sinuses/Orbits: Unremarkable. Other: None. IMPRESSION: 1. No acute intracranial abnormality. 2. Moderate chronic small-vessel disease. Electronically Signed   By: Orvan Falconer M.D.   On: 11/04/2022 14:49   _______________________________________________________________________________________________________ Latest  Blood pressure 135/77, pulse (!) 105, temperature 98.5 F (36.9 C), resp. rate 16, height 5\' 9"  (1.753 m), weight 81.6 kg, SpO2 98 %.   Vitals  labs  and radiology finding personally reviewed  Review of Systems:    Pertinent positives include:   fatigue, LEft leg weakness  Constitutional:  No weight loss, night sweats, Fevers, chills,weight loss  HEENT:  No headaches, Difficulty swallowing,Tooth/dental problems,Sore throat,  No sneezing, itching, ear ache, nasal congestion, post nasal drip,  Cardio-vascular:  No chest pain, Orthopnea, PND, anasarca, dizziness, palpitations.no Bilateral lower extremity swelling  GI:  No heartburn, indigestion, abdominal pain, nausea, vomiting, diarrhea, change in bowel habits, loss of appetite, melena, blood in stool, hematemesis Resp:  no shortness of breath at rest. No dyspnea on exertion, No excess mucus, no productive cough, No non-productive cough, No coughing up of blood.No change in color of mucus.No wheezing. Skin:  no rash or lesions. No jaundice GU:  no dysuria, change in color of urine, no urgency or frequency. No straining to urinate.  No flank pain.  Musculoskeletal:  No joint pain or no joint swelling. No decreased range of motion. No back pain.  Psych:  No change in mood or affect. No depression or anxiety. No memory loss.  Neuro: no localizing neurological complaints, no tingling, no weakness, no double vision, no gait abnormality, no slurred speech, no confusion  All systems reviewed and apart from HOPI all are negative _______________________________________________________________________________________________ Past Medical History:   Past Medical History:  Diagnosis Date   Diabetes mellitus without complication (HCC)    Hyperlipidemia    Hypertension        Past Surgical History:  Procedure Laterality Date   HEMORRHOID SURGERY N/A 06/08/2013   Procedure: HEMORRHOIDECTOMY;  Surgeon: Romie Levee, MD;  Location: [facility];  Service: General;  Laterality: N/A;   NO PAST SURGERIES      Social History:  Ambulatory   independently      reports that  he quit smoking about 2 years ago. His smoking use included pipe. He has a 40.00 pack-year smoking history. He has never used smokeless tobacco. He reports that he does not drink alcohol and does not use drugs.     Family History:   History reviewed. No pertinent family history. ______________________________________________________________________________________________ Allergies: No Known Allergies   Prior to Admission medications   Medication Sig Start Date End Date Taking? Authorizing Provider  aspirin EC 81 MG tablet Take 81 mg by mouth daily.   Yes [provider]  Potassium Chloride ER 20 MEQ TBCR Take 20 mEq by mouth daily.    Yes [provider]  amLODipine (NORVASC) 10 MG tablet Take 1 tablet (10 mg total) by mouth daily. 04/02/22   Dettinger, Elige Radon, MD  cilostazol (PLETAL) 100 MG tablet Take 1 tablet (100 mg total) by mouth 2 (  two) times daily. 04/02/22   Dettinger, Elige Radon, MD  citalopram (CELEXA) 40 MG tablet Take 1 tablet (40 mg total) by mouth daily. 05/05/22   Dettinger, Elige Radon, MD  Continuous Glucose Receiver (DEXCOM G7 RECEIVER) DEVI 1 each by Does not apply route 4 (four) times daily. 10/29/22   Dettinger, Elige Radon, MD  Continuous Glucose Sensor (DEXCOM G7 SENSOR) MISC Change every 10 days 10/29/22   Dettinger, Elige Radon, MD  Dulaglutide (TRULICITY) 4.5 MG/0.5ML SOPN Inject 4.5 mg as directed once a week. DX: E11.65 05/09/22   Dettinger, Elige Radon, MD  glucose blood (ACCU-CHEK GUIDE) test strip Use as instructed to test blood sugar daily. DX: E11.65 (patient using guide meter now) 10/29/22   Dettinger, Elige Radon, MD  glucose monitoring kit (FREESTYLE) monitoring kit 1 each by Does not apply route 2 (two) times daily before a meal. 05/15/21   Dettinger, Elige Radon, MD  hydrochlorothiazide (HYDRODIURIL) 25 MG tablet Take 1 tablet (25 mg total) by mouth daily. 04/02/22   Dettinger, Elige Radon, MD  hydrOXYzine (VISTARIL) 50 MG capsule Take 1 capsule (50 mg total)  by mouth 3 (three) times daily as needed. 05/05/22   Dettinger, Elige Radon, MD  Insulin Glargine (BASAGLAR KWIKPEN) 100 UNIT/ML Inject 70 Units into the skin daily. 10/29/22   Dettinger, Elige Radon, MD  insulin lispro (HUMALOG KWIKPEN) 100 UNIT/ML KwikPen Inject 10 Units into the skin 3 (three) times daily with meals. 05/09/22   Dettinger, Elige Radon, MD  Insulin Pen Needle (GNP ULTICARE PEN NEEDLES) 32G X 6 MM MISC Use 3 times daily DX: E11.65 05/22/22   Dettinger, Elige Radon, MD  lidocaine (XYLOCAINE) 5 % ointment Apply 1 application topically 3 (three) times daily as needed. Patient not taking: Reported on 11/04/2022 02/06/21   Dettinger, Elige Radon, MD  losartan (COZAAR) 100 MG tablet Take 1 tablet (100 mg total) by mouth daily. 04/02/22   Dettinger, Elige Radon, MD  pravastatin (PRAVACHOL) 80 MG tablet Take 1 tablet (80 mg total) by mouth daily. 10/29/22   Dettinger, Elige Radon, MD  QUEtiapine (SEROQUEL) 50 MG tablet Take 1 tablet (50 mg total) by mouth at bedtime. 05/05/22   Dettinger, Elige Radon, MD    ___________________________________________________________________________________________________ Physical Exam:    11/04/2022    7:53 PM 11/04/2022    5:49 PM 11/04/2022    3:30 PM  Vitals with BMI  Systolic 135 164 161  Diastolic 77 91 92  Pulse 105 119 110     1. General:  in No  Acute distress   Chronically ill   -appearing 2. Psychological: Alert and   Oriented 3. Head/ENT:    Dry Mucous Membranes                          Head Non traumatic, neck supple                        Poor Dentition 4. SKIN:  decreased Skin turgor,  Skin clean Dry and intact no rash    5. Heart: Regular rate and rhythm no  Murmur, no Rub or gallop 6. Lungs:  no wheezes or crackles   7. Abdomen: Soft, ***non-tender, Non distended *** obese ***bowel sounds present 8. Lower extremities: no clubbing, cyanosis, no ***edema 9. Neurologically Grossly intact, moving all 4 extremities equally *** strength 5 out of 5 in all 4  extremities cranial nerves II through XII intact 10. MSK: Normal range  of motion    Chart has been reviewed  ______________________________________________________________________________________________  Assessment/Plan 72 y.o. male with medical history significant of Dm2, HLD, HTN  Admitted for stroke like symptoms  Hyperglycemia    Present on Admission:  TIA (transient ischemic attack)     No problem-specific Assessment & Plan notes found for this encounter.    Other plan as per orders.  DVT prophylaxis:  SCD *** Lovenox       Code Status:    Code Status: Prior FULL CODE *** DNR/DNI ***comfort care as per patient ***family  I had personally discussed CODE STATUS with patient and family* I had spent *min discussing goals of care and CODE STATUS ACP has been reviewed ***   Family Communication:   Family not at  Bedside  plan of care was discussed on the phone with *** Son, Daughter, Wife, Husband, Sister, Brother , father, mother  Diet  Diet Orders (From admission, onward)     Start     Ordered   11/04/22 1410  Diet NPO time specified  Diet effective now        11/04/22 1409            Disposition Plan:   *** likely will need placement for rehabilitation                          Back to current facility when stable                            To home once workup is complete and patient is stable  ***Following barriers for discharge:                            Electrolytes corrected                               Anemia corrected                             Pain controlled with PO medications                               Afebrile, white count improving able to transition to PO antibiotics                             Will need to be able to tolerate PO                            Will likely need home health, home O2, set up                           Will need consultants to evaluate patient prior to discharge       Consult Orders  (From admission,  onward)           Start     Ordered   11/04/22 1510  Consult to hospitalist  Emika with cl called for consult  Once       Provider:  (Not yet assigned)  Question Answer Comment  Place call to: [facility]   Reason  for Consult Admit      11/04/22 1510                               Would benefit from PT/OT eval prior to DC  Ordered                                       Diabetes care coordinator                   Consults called: neurology    Admission status:  ED Disposition     ED Disposition  Admit   Condition  --   Comment  Hospital Area: [facility] [[id number]] Level of Care: Telemetry Medical [104] Interfacility transfer: Yes May place patient in observation at [facility] or [facility] if equivalent level of care is available:: No Covid Evaluation:  Asymptomatic - no recent exposure (last 10 days) testing not required Diagnosis: TIA (transient ischemic attack) [161096] Admitting Physician: Synetta Fail [[id number]] Attending Physician: Synetta Fail [phone]           Obs        Level of care     tele    indefinitely please discontinue once patient no longer qualifies COVID-19 Labs    Mckenzy Salazar 11/04/2022, 9:00 PM    [facility]     after 2 AM please page floor coverage PA If 7AM-7PM, please contact the day team taking care of the patient using [email or web address]

## 2022-11-04 NOTE — Progress Notes (Signed)
Admitting notified of patient arrival from [facility] ED. Plan too assign MD

## 2022-11-04 NOTE — Consult Note (Signed)
Neurology Consultation  Reason for Consult: Left leg numbness and weakness, concern for stroke Referring Physician: Dr. Adela Glimpse  CC: Left leg numbness and weakness  History is obtained from: Patient chart  HPI: Randy [name] is a 72 y.o. 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  He says he woke up normal without the symptoms but the ER notes say that he had reported that his last known well was 10 PM when he went to bed and he woke up with the symptoms.  He denies waking up with the symptoms and says that this started right at 7 AM. Symptoms have now completely resolved.  They lasted for a good 8 to 9 hours. Denies prior history of strokes Admitted to medicine for stroke/TIA workup   LKW: 7 AM IV thrombolysis given?: no, decision made at the outside center due to the history of last known well being 10 PM last night rather than 7 AM this morning which he continues to say that 7 AM he was perfectly fine and had a sudden onset of symptoms EVT: symptoms are resolved Premorbid modified Rankin scale (mRS): 0  ROS: Full ROS was performed and is negative except as noted in the HPI.   Past Medical History:  Diagnosis Date   Diabetes mellitus without complication (HCC)    Hyperlipidemia    Hypertension      History reviewed. No pertinent family history.   Social History:   reports that he quit smoking about 2 years ago. His smoking use included pipe. He has a 40.00 pack-year smoking history. He has never used smokeless tobacco. He reports that he does not drink alcohol and does not use drugs.  Medications  Current Facility-Administered Medications:    [START ON 11/05/2022]  stroke: early stages of recovery book, , Does not apply, Once, Doutova, Anastassia, MD    [COMPLETED] sodium chloride 0.9 % bolus 500 mL, 500 mL, Intravenous, Once, Stopped at 11/04/22 1520 **FOLLOWED BY** 0.9 %  sodium chloride infusion, 1,000 mL, Intravenous, Continuous, Doutova, Anastassia, MD, Last Rate: 125 mL/hr at 11/04/22 1512, 1,000 mL at 11/04/22 1512   0.9 %  sodium chloride infusion, , Intravenous, Continuous, Doutova, Anastassia, MD   acetaminophen (TYLENOL) tablet 650 mg, 650 mg, Oral, Q4H PRN **OR** acetaminophen (TYLENOL) 160 MG/5ML solution 650 mg, 650 mg, Per Tube, Q4H PRN **OR** acetaminophen (TYLENOL) suppository 650 mg, 650 mg, Rectal, Q4H PRN, Therisa Doyne, MD   [START ON 11/05/2022] aspirin EC tablet 81 mg, 81 mg, Oral, Daily, Doutova, Anastassia, MD   [START ON 11/05/2022] citalopram (CELEXA) tablet 40 mg, 40 mg, Oral, Daily, Doutova, Anastassia, MD   insulin aspart (novoLOG) injection 0-9 Units, 0-9 Units, Subcutaneous, Q4H, Doutova, Anastassia, MD   [START ON 11/05/2022] pravastatin (PRAVACHOL) tablet 80 mg, 80 mg, Oral, Daily, Doutova, Anastassia, MD   QUEtiapine (SEROQUEL) tablet 50 mg, 50 mg, Oral, QHS, Doutova, Anastassia, MD   Exam: Current vital signs: BP 135/77 (BP Location: Right Arm)   Pulse (!) 105   Temp 98.5 F (36.9 C)   Resp 16   Ht 5\' 9"  (1.753 m)   Wt 81.6 kg   SpO2 98%   BMI 26.58 kg/m  Vital signs in last 24 hours: Temp:  [97.9 F (36.6 C)-98.7 F (37.1 C)] 98.5 F (36.9 C) ([birth year]  1953) Pulse Rate:  [100-119] 105 (05/28 1953) Resp:  [13-18] 16 (05/28 1953) BP: (135-188)/(77-100) 135/77 (05/28 1953) SpO2:  [96 %-99 %] 98 % (05/28 1953) Weight:  [81.6 kg] 81.6 kg (05/28 1346)  GENERAL: Awake, alert in NAD HEENT: - Normocephalic and atraumatic, dry mm, no LN++, no Thyromegally LUNGS - Clear to auscultation bilaterally with no wheezes CV - S1S2 RRR, no m/r/g, equal pulses bilaterally. ABDOMEN - Soft, nontender, nondistended with normoactive BS Ext: warm, well perfused, intact peripheral pulses, no  edema  NEURO:  Mental  Status: AA&Ox3  Language: speech is not dysarthric.  Naming, repetition, fluency, and comprehension intact. Cranial Nerves: PERRL EOMI, visual fields full, no facial asymmetry, facial sensation intact, hearing intact, tongue/uvula/soft palate midline, normal sternocleidomastoid and trapezius muscle strength. No evidence of tongue atrophy or fibrillations Motor: no drift in any of the 4 ext Tone: is normal and bulk is normal Sensation- Intact to light touch bilaterall Coordination: FTN intact bilaterally, no ataxia in BLE. Gait- deferred  NIHSS--0  Labs I have reviewed labs in epic and the results pertinent to this consultation are:  CBC    Component Value Date/Time   WBC 9.7 11/04/2022 1417   RBC 4.12 (L) 11/04/2022 1417   HGB 14.0 11/04/2022 1417   HGB 14.9 07/09/2022 0837   HCT 38.3 (L) 11/04/2022 1417   HCT 44.1 07/09/2022 0837   PLT 201 11/04/2022 1417   PLT 217 07/09/2022 0837   MCV 93.0 11/04/2022 1417   MCV 98 (H) 07/09/2022 0837   MCH 34.0 11/04/2022 1417   MCHC 36.6 (H) 11/04/2022 1417   RDW 12.7 11/04/2022 1417   RDW 12.3 07/09/2022 0837   LYMPHSABS 1.7 11/04/2022 1417   LYMPHSABS 1.5 07/09/2022 0837   MONOABS 0.7 11/04/2022 1417   EOSABS 0.1 11/04/2022 1417   EOSABS 0.1 07/09/2022 0837   BASOSABS 0.0 11/04/2022 1417   BASOSABS 0.1 07/09/2022 0837    CMP     Component Value Date/Time   NA 133 (L) 11/04/2022 1417   NA 139 07/09/2022 0837   K 3.4 (L) 11/04/2022 1417   CL 96 (L) 11/04/2022 1417   CO2 25 11/04/2022 1417   GLUCOSE 401 (H) 11/04/2022 1417   BUN 29 (H) 11/04/2022 1417   BUN 25 07/09/2022 0837   CREATININE 1.67 (H) 11/04/2022 1417   CALCIUM 9.0 11/04/2022 1417   PROT 6.5 11/04/2022 1417   PROT 6.5 11/27/2021 0813   ALBUMIN 4.4 11/04/2022 1417   ALBUMIN 4.3 11/27/2021 0813   AST 13 (L) 11/04/2022 1417   ALT 12 11/04/2022 1417   ALKPHOS 74 11/04/2022 1417   BILITOT 0.6 11/04/2022 1417   BILITOT 0.3 11/27/2021 0813   GFRNONAA 44 (L)  11/04/2022 1417   GFRAA 74 04/25/2020 0918    Lipid Panel     Component Value Date/Time   CHOL 152 07/09/2022 0837   TRIG 106 07/09/2022 0837   HDL 49 07/09/2022 0837   CHOLHDL 3.1 07/09/2022 0837   LDLCALC 84 07/09/2022 0837   Imaging I have reviewed the images obtained:  CT-head-no acute changes  MRI examination of the brain-pending  Assessment: 72 year old with diabetes hypertension hyperlipidemia with multiple hours worth of left leg numbness and heaviness along with weakness that according to him started at 7 AM today but according to ER records, he reported to them that his last known well was 10 PM and he woke up this morning at 4:30 AM with progressive worsening of the symptoms.  I am not  sure if he has a reliable historian but in any case, his symptoms have now resolved. He was not deemed to be a candidate for TNKase due to the initial time of last known well being 10 PM at the outside center.  Impression:-Likely small vessel stroke versus TIA given risk factors  Recommendations: -Admit to hospitalist or observation -Telemetry monitoring -Allow for permissive hypertension for the first 24-48h - only treat PRN if SBP >220 mmHg. Blood pressures can be gradually normalized to SBP<140 upon discharge. -MRI brain without contrast -MRA head without contrast--deranged renal function, will avoid CTA -Carotid dopplers -Echocardiogram -HgbA1c, fasting lipid panel -Frequent neuro checks -Prophylactic therapy-Antiplatelet med: Aspirin 81.  He is on cilostazol at home, continue cilostazol. -High intensity statin for goal LDL less than 70 -PT consult, OT consult, Speech consult -If Afib found on telemetry, will need anticoagulation. Decision pending imaging and stroke team rounding.  Please page stroke NP/PA/MD (listed on AMION)  from 8am-4 pm as this patient will be followed by the stroke team at this point.  Plan discussed with Dr. Adela Glimpse -- Milon Dikes,  MD Neurologist [facility] Pager: [phone]

## 2022-11-04 NOTE — Progress Notes (Signed)
Patient arrived from [facility] ED accompanied by [facility].

## 2022-11-05 ENCOUNTER — Other Ambulatory Visit (HOSPITAL_COMMUNITY): Payer: Self-pay

## 2022-11-05 ENCOUNTER — Observation Stay (HOSPITAL_COMMUNITY): Payer: No Typology Code available for payment source

## 2022-11-05 ENCOUNTER — Observation Stay (HOSPITAL_BASED_OUTPATIENT_CLINIC_OR_DEPARTMENT_OTHER): Payer: No Typology Code available for payment source

## 2022-11-05 DIAGNOSIS — Z7982 Long term (current) use of aspirin: Secondary | ICD-10-CM | POA: Diagnosis not present

## 2022-11-05 DIAGNOSIS — Z87891 Personal history of nicotine dependence: Secondary | ICD-10-CM | POA: Diagnosis not present

## 2022-11-05 DIAGNOSIS — Z794 Long term (current) use of insulin: Secondary | ICD-10-CM

## 2022-11-05 DIAGNOSIS — E876 Hypokalemia: Secondary | ICD-10-CM | POA: Diagnosis not present

## 2022-11-05 DIAGNOSIS — I63311 Cerebral infarction due to thrombosis of right middle cerebral artery: Secondary | ICD-10-CM

## 2022-11-05 DIAGNOSIS — I6389 Other cerebral infarction: Secondary | ICD-10-CM | POA: Diagnosis not present

## 2022-11-05 DIAGNOSIS — G459 Transient cerebral ischemic attack, unspecified: Secondary | ICD-10-CM

## 2022-11-05 DIAGNOSIS — E782 Mixed hyperlipidemia: Secondary | ICD-10-CM | POA: Diagnosis not present

## 2022-11-05 DIAGNOSIS — E1169 Type 2 diabetes mellitus with other specified complication: Secondary | ICD-10-CM | POA: Diagnosis not present

## 2022-11-05 DIAGNOSIS — I10 Essential (primary) hypertension: Secondary | ICD-10-CM | POA: Diagnosis not present

## 2022-11-05 DIAGNOSIS — E1165 Type 2 diabetes mellitus with hyperglycemia: Secondary | ICD-10-CM | POA: Diagnosis not present

## 2022-11-05 DIAGNOSIS — I639 Cerebral infarction, unspecified: Secondary | ICD-10-CM | POA: Diagnosis not present

## 2022-11-05 DIAGNOSIS — Z79899 Other long term (current) drug therapy: Secondary | ICD-10-CM | POA: Diagnosis not present

## 2022-11-05 LAB — COMPREHENSIVE METABOLIC PANEL
ALT: 16 U/L (ref 0–44)
AST: 15 U/L (ref 15–41)
Albumin: 3.3 g/dL — ABNORMAL LOW (ref 3.5–5.0)
Alkaline Phosphatase: 65 U/L (ref 38–126)
Anion gap: 8 (ref 5–15)
BUN: 17 mg/dL (ref 8–23)
CO2: 26 mmol/L (ref 22–32)
Calcium: 8.7 mg/dL — ABNORMAL LOW (ref 8.9–10.3)
Chloride: 102 mmol/L (ref 98–111)
Creatinine, Ser: 1.17 mg/dL (ref 0.61–1.24)
GFR, Estimated: >60 mL/min (ref >60)
Glucose, Bld: 175 mg/dL — ABNORMAL HIGH (ref 70–99)
Potassium: 3 mmol/L — ABNORMAL LOW (ref 3.5–5.1)
Sodium: 136 mmol/L (ref 135–145)
Total Bilirubin: 0.6 mg/dL (ref 0.3–1.2)
Total Protein: 5.9 g/dL — ABNORMAL LOW (ref 6.5–8.1)

## 2022-11-05 LAB — CBC
HCT: 39.5 % (ref 39.0–52.0)
Hemoglobin: 14.2 g/dL (ref 13.0–17.0)
MCH: 33.9 pg (ref 26.0–34.0)
MCHC: 35.9 g/dL (ref 30.0–36.0)
MCV: 94.3 fL (ref 80.0–100.0)
Platelets: 178 10*3/uL (ref 150–400)
RBC: 4.19 MIL/uL — ABNORMAL LOW (ref 4.22–5.81)
RDW: 12.7 % (ref 11.5–15.5)
WBC: 7.6 10*3/uL (ref 4.0–10.5)
nRBC: 0 % (ref 0.0–0.2)

## 2022-11-05 LAB — ECHOCARDIOGRAM COMPLETE
Area-P 1/2: 4.96 cm2
Calc EF: 51.6 %
Height: 69 in
S' Lateral: 3.2 cm
Single Plane A2C EF: 51.1 %
Single Plane A4C EF: 52.4 %
Weight: 2880 oz

## 2022-11-05 LAB — CK: Total CK: 59 U/L (ref 49–397)

## 2022-11-05 LAB — GLUCOSE, CAPILLARY
Glucose-Capillary: 204 mg/dL — ABNORMAL HIGH (ref 70–99)
Glucose-Capillary: 164 mg/dL — ABNORMAL HIGH (ref 70–99)
Glucose-Capillary: 236 mg/dL — ABNORMAL HIGH (ref 70–99)

## 2022-11-05 LAB — HIV ANTIBODY (ROUTINE TESTING W REFLEX): HIV Screen 4th Generation wRfx: NONREACTIVE

## 2022-11-05 MED ORDER — POTASSIUM CHLORIDE CRYS ER 20 MEQ PO TBCR
40.0000 meq | EXTENDED_RELEASE_TABLET | Freq: Once | ORAL | Status: AC
  Administered 2022-11-05: 40 meq via ORAL
  Filled 2022-11-05: qty 2

## 2022-11-05 MED ORDER — INSULIN ASPART 100 UNIT/ML IJ SOLN: 0.0000 [IU] | Freq: Every day | INTRAMUSCULAR | Status: DC

## 2022-11-05 MED ORDER — INSULIN ASPART 100 UNIT/ML IJ SOLN
0.0000 [IU] | Freq: Three times a day (TID) | INTRAMUSCULAR | Status: DC
  Administered 2022-11-05: 3 [IU] via SUBCUTANEOUS
  Administered 2022-11-05: 5 [IU] via SUBCUTANEOUS

## 2022-11-05 MED ORDER — CILOSTAZOL 100 MG PO TABS
100.0000 mg | ORAL_TABLET | Freq: Two times a day (BID) | ORAL | Status: DC
  Administered 2022-11-05 (×2): 100 mg via ORAL
  Filled 2022-11-05 (×3): qty 1

## 2022-11-05 MED ORDER — TICAGRELOR 90 MG PO TABS
90.0000 mg | ORAL_TABLET | Freq: Two times a day (BID) | ORAL | 0 refills | Status: DC
  Filled 2022-11-05: qty 60, 30d supply, fill #0

## 2022-11-05 MED ORDER — ROSUVASTATIN CALCIUM 20 MG PO TABS
40.0000 mg | ORAL_TABLET | Freq: Every day | ORAL | Status: DC
  Administered 2022-11-05: 40 mg via ORAL
  Filled 2022-11-05: qty 2

## 2022-11-05 MED ORDER — ROSUVASTATIN CALCIUM 40 MG PO TABS
40.0000 mg | ORAL_TABLET | Freq: Every day | ORAL | 0 refills | Status: DC
  Filled 2022-11-05: qty 90, 90d supply, fill #0

## 2022-11-05 NOTE — Assessment & Plan Note (Signed)
Will replace and recheck 

## 2022-11-05 NOTE — Progress Notes (Signed)
STROKE TEAM PROGRESS NOTE   SUBJECTIVE (INTERVAL HISTORY) No family is at the bedside.  Overall his condition is completely resolved. He stated that he felt he is back to baseline. He is taking ASA and pletal at home, will recommend ASA and brilinta for 30 days and then back to home meds.    OBJECTIVE Temp:  [98 F (36.7 C)-98.6 F (37 C)] 98.3 F (36.8 C) (05/29 1521) Pulse Rate:  [92-104] 104 (05/29 1521) Cardiac Rhythm: Normal sinus rhythm (05/29 0736) Resp:  [16-19] 16 (05/29 1511) BP: (139-151)/(71-87) 151/71 (05/29 1521) SpO2:  [97 %-98 %] 98 % (05/29 1521)  Recent Labs  Lab 11/04/22 1751 11/04/22 2330 11/05/22 0336 11/05/22 0742 11/05/22 1221  GLUCAP 208* 242* 204* 164* 236*   Recent Labs  Lab 11/04/22 1417 11/04/22 2230 11/05/22 0606  NA 133* 136 136  K 3.4* 3.4* 3.0*  CL 96* 99 102  CO2 25 25 26   GLUCOSE 401* 254* 175*  BUN 29* 19 17  CREATININE 1.67* 1.22 1.17  CALCIUM 9.0 9.5 8.7*  MG  --  1.9  --   PHOS  --  3.1  --    Recent Labs  Lab 11/04/22 1417 11/04/22 2230 11/05/22 0606  AST 13* 21 15  ALT 12 18 16   ALKPHOS 74 78 65  BILITOT 0.6 0.9 0.6  PROT 6.5 7.1 5.9*  ALBUMIN 4.4 4.2 3.3*   Recent Labs  Lab 11/04/22 1417 11/04/22 2230 11/05/22 0606  WBC 9.7 10.8* 7.6  NEUTROABS 7.2 7.8*  --   HGB 14.0 15.8 14.2  HCT 38.3* 43.7 39.5  MCV 93.0 92.4 94.3  PLT 201 220 178   Recent Labs  Lab 11/05/22 0606  CKTOTAL 59   Recent Labs    11/04/22 1417  LABPROT 13.1  INR 1.0   Recent Labs    11/04/22 2230  COLORURINE STRAW*  LABSPEC 1.005  PHURINE 7.0  GLUCOSEU 150*  HGBUR SMALL*  BILIRUBINUR NEGATIVE  KETONESUR NEGATIVE  PROTEINUR NEGATIVE  NITRITE NEGATIVE  LEUKOCYTESUR NEGATIVE       Component Value Date/Time   CHOL 189 11/04/2022 2230   CHOL 152 07/09/2022 0837   TRIG 172 (H) 11/04/2022 2230   HDL 50 11/04/2022 2230   HDL 49 07/09/2022 0837   CHOLHDL 3.8 11/04/2022 2230   VLDL 34 11/04/2022 2230   LDLCALC 105 (H)  11/04/2022 2230   LDLCALC 84 07/09/2022 0837   Lab Results  Component Value Date   HGBA1C 8.9 (H) 10/29/2022      Component Value Date/Time   LABOPIA NONE DETECTED 11/04/2022 2231   COCAINSCRNUR NONE DETECTED 11/04/2022 2231   LABBENZ NONE DETECTED 11/04/2022 2231   AMPHETMU NONE DETECTED 11/04/2022 2231   THCU NONE DETECTED 11/04/2022 2231   LABBARB NONE DETECTED 11/04/2022 2231    Recent Labs  Lab 11/04/22 1417 11/04/22 2230  ETH <10 <10    I have personally reviewed the radiological images below and agree with the radiology interpretations.  VAS US CAROTID (at [facility] and [facility] only)  Result Date: 11/05/2022 Carotid Arterial Duplex Study Patient Name:  Randy [name]  Date of Exam:   11/05/2022 Medical Rec #: [id number]         Accession #:    [id number] Date of Birth: 04-24-1951        Patient Gender: M Patient Age:   72 years Exam Location:  [facility] Procedure:      VAS US CAROTID Referring Phys: Jonny Ruiz DOUTOVA --------------------------------------------------------------------------------  Indications:       Acute infarct right corona radiata seen on MRI, left sided                    numbness and weakness. Risk Factors:      Hypertension, hyperlipidemia, Diabetes. Comparison Study:  No previous study. Performing Technologist: Christene Lye  Examination Guidelines: A complete evaluation includes B-mode imaging, spectral Doppler, color Doppler, and power Doppler as needed of all accessible portions of each vessel. Bilateral testing is considered an integral part of a complete examination. Limited examinations for reoccurring indications may be performed as noted.  Right Carotid Findings: +----------+--------+--------+--------+----------------------+--------+           PSV cm/sEDV cm/sStenosisPlaque Description    Comments +----------+--------+--------+--------+----------------------+--------+ CCA Prox  60      11                                              +----------+--------+--------+--------+----------------------+--------+ CCA Distal56      8                                              +----------+--------+--------+--------+----------------------+--------+ ICA Prox  57      14      1-39%   smooth and homogeneous         +----------+--------+--------+--------+----------------------+--------+ ICA Distal118     24                                    tortuous +----------+--------+--------+--------+----------------------+--------+ ECA       191     22                                             +----------+--------+--------+--------+----------------------+--------+ +----------+--------+-------+----------------+-------------------+           PSV cm/sEDV cmsDescribe        Arm Pressure (mmHG) +----------+--------+-------+----------------+-------------------+ [id number]            Multiphasic, WNL                    +----------+--------+-------+----------------+-------------------+ +---------+--------+--+--------+--+---------+ VertebralPSV cm/s48EDV cm/s11Antegrade +---------+--------+--+--------+--+---------+  Left Carotid Findings: +----------+-------+--------+--------+--------------------------------+--------+           PSV    EDV cm/sStenosisPlaque Description              Comments           cm/s                                                            +----------+-------+--------+--------+--------------------------------+--------+ CCA Prox  72     12                                                       +----------+-------+--------+--------+--------------------------------+--------+  CCA Distal65     9               irregular and heterogenous               +----------+-------+--------+--------+--------------------------------+--------+ ICA Prox  55     18      1-39%   homogeneous, irregular and                                                calcific                                  +----------+-------+--------+--------+--------------------------------+--------+ ICA Distal57     16                                                       +----------+-------+--------+--------+--------------------------------+--------+ ECA       142    13                                                       +----------+-------+--------+--------+--------------------------------+--------+ +----------+--------+--------+----------------+-------------------+           PSV cm/sEDV cm/sDescribe        Arm Pressure (mmHG) +----------+--------+--------+----------------+-------------------+ [id number]             Multiphasic, WNL                    +----------+--------+--------+----------------+-------------------+ +---------+--------+---+--------+--+---------+ VertebralPSV cm/s143EDV cm/s32Antegrade +---------+--------+---+--------+--+---------+   Summary: Right Carotid: Velocities in the right ICA are consistent with a 1-39% stenosis. Left Carotid: Velocities in the left ICA are consistent with a 1-39% stenosis. Vertebrals:  Bilateral vertebral arteries demonstrate antegrade flow. Subclavians: Normal flow hemodynamics were seen in bilateral subclavian              arteries. *See table(s) above for measurements and observations.  Electronically signed by Delia Heady MD on 11/05/2022 at 5:03:39 PM.    Final    ECHOCARDIOGRAM COMPLETE  Result Date: 11/05/2022    ECHOCARDIOGRAM REPORT   Patient Name:   SAMMEY FREE Date of Exam: 11/05/2022 Medical Rec #:  [id number]        Height:       69.0 in Accession #:    [id number]       Weight:       180.0 lb Date of Birth:  [birth year]       BSA:          1.976 m Patient Age:    71 years         BP:           146/74 mmHg Patient Gender: M                HR:           98 bpm. Exam Location:  Inpatient Procedure: 2D Echo, Color Doppler and Cardiac Doppler Indications:    TIA  History:        Patient has no prior history  of Echocardiogram  examinations.                 Stroke; Risk Factors:Diabetes, Hypertension, Dyslipidemia and                 Current Smoker.  Sonographer:    Milbert Coulter Referring Phys: [id number] ANASTASSIA DOUTOVA IMPRESSIONS  1. Left ventricular ejection fraction, by estimation, is 50 to 55%. The left ventricle has low normal function. Left ventricular endocardial border not optimally defined to evaluate regional wall motion. Indeterminate diastolic filling due to E-A fusion.  2. Right ventricular systolic function is normal. The right ventricular size is normal.  3. The mitral valve is abnormal. No evidence of mitral valve regurgitation.  4. The aortic valve was not well visualized. Aortic valve regurgitation is not visualized. No aortic stenosis is present.  5. The inferior vena cava is normal in size with greater than 50% respiratory variability, suggesting right atrial pressure of 3 mmHg.  6. Cannot exclude a small PFO. Comparison(s): No prior Echocardiogram. FINDINGS  Left Ventricle: Left ventricular ejection fraction, by estimation, is 50 to 55%. The left ventricle has low normal function. Left ventricular endocardial border not optimally defined to evaluate regional wall motion. The left ventricular internal cavity  size was normal in size. Suboptimal image quality limits for assessment of left ventricular hypertrophy. Indeterminate diastolic filling due to E-A fusion. Right Ventricle: The right ventricular size is normal. No increase in right ventricular wall thickness. Right ventricular systolic function is normal. Left Atrium: Left atrial size was normal in size. Right Atrium: Right atrial size was normal in size. Pericardium: Trivial pericardial effusion is present. The pericardial effusion is anterior to the right ventricle. Presence of epicardial fat layer. Mitral Valve: The mitral valve is abnormal. No evidence of mitral valve regurgitation. Tricuspid Valve: The tricuspid valve is normal in structure. Tricuspid valve  regurgitation is not demonstrated. No evidence of tricuspid stenosis. Aortic Valve: The aortic valve was not well visualized. Aortic valve regurgitation is not visualized. No aortic stenosis is present. Pulmonic Valve: The pulmonic valve was not well visualized. Pulmonic valve regurgitation is not visualized. No evidence of pulmonic stenosis. Aorta: The aortic root is normal in size and structure and the ascending aorta was not well visualized. Venous: The inferior vena cava is normal in size with greater than 50% respiratory variability, suggesting right atrial pressure of 3 mmHg. IAS/Shunts: Cannot exclude a small PFO.  LEFT VENTRICLE PLAX 2D LVIDd:         4.40 cm     Diastology LVIDs:         3.20 cm     LV e' medial:    7.04 cm/s LV PW:         1.50 cm     LV E/e' medial:  8.3 LV IVS:        1.50 cm     LV e' lateral:   8.36 cm/s LVOT diam:     2.20 cm     LV E/e' lateral: 7.0 LVOT Area:     3.80 cm  LV Volumes (MOD) LV vol d, MOD A2C: 56.0 ml LV vol d, MOD A4C: 46.6 ml LV vol s, MOD A2C: 27.4 ml LV vol s, MOD A4C: 22.2 ml LV SV MOD A2C:     28.6 ml LV SV MOD A4C:     46.6 ml LV SV MOD BP:      26.3 ml RIGHT VENTRICLE RV S prime:     18.30  cm/s TAPSE (M-mode): 2.3 cm LEFT ATRIUM             Index        RIGHT ATRIUM           Index LA diam:        3.70 cm 1.87 cm/m   RA Area:     13.60 cm LA Vol (A2C):   40.9 ml 20.70 ml/m  RA Volume:   32.40 ml  16.40 ml/m LA Vol (A4C):   48.7 ml 24.65 ml/m LA Biplane Vol: 45.0 ml 22.78 ml/m   AORTA Ao Root diam: 3.30 cm MITRAL VALVE MV Area (PHT): 4.96 cm    SHUNTS MV Decel Time: 153 msec    Systemic Diam: 2.20 cm MV E velocity: 58.70 cm/s MV A velocity: 97.70 cm/s MV E/A ratio:  0.60 Riley Lam MD Electronically signed by Riley Lam MD Signature Date/Time: 11/05/2022/12:12:23 PM    Final    MR BRAIN WO CONTRAST  Result Date: 11/04/2022 CLINICAL DATA:  Left-sided numbness and weakness EXAM: MRI HEAD WITHOUT CONTRAST MRA HEAD WITHOUT CONTRAST  TECHNIQUE: Multiplanar, multi-echo pulse sequences of the brain and surrounding structures were acquired without intravenous contrast. Angiographic images of the Circle of Willis were acquired using MRA technique without intravenous contrast. COMPARISON:  No prior MRI available, correlation is made with CT head 11/04/2022 FINDINGS: MRI HEAD FINDINGS Brain: 8 mm focus of restricted diffusion with ADC correlate in the right corona radiata (series 2, image 30-31). This focus is associated with T2 hyperintense signal. No acute hemorrhage, mass, mass effect, or midline shift. No hydrocephalus or extra-axial collection. Normal craniocervical junction. Foci of hemosiderin deposition in the bilateral thalami and right posterior frontal lobe, likely sequela of prior hypertensive microhemorrhages. Remote lacunar infarcts in the bilateral basal ganglia and corona radiata. Scattered and confluent T2 hyperintense signal in the periventricular white matter, likely the sequela of mild-to-moderate chronic small vessel ischemic disease. Dilated perivascular spaces in the basal ganglia. Vascular: Normal arterial flow voids. Skull and upper cervical spine: Normal marrow signal. Sinuses/Orbits: Clear paranasal sinuses. No acute finding in the orbits. Other: Fluid in the left mastoid air cells MRA HEAD FINDINGS Anterior circulation: Both internal carotid arteries are patent to the termini, without significant stenosis. A1 segments patent. Normal anterior communicating artery. Anterior cerebral arteries are patent to their distal aspects. No M1 stenosis or occlusion. Normal MCA bifurcations. Distal MCA branches perfused and symmetric. Posterior circulation: Vertebral arteries patent to the vertebrobasilar junction without stenosis. Posterior inferior cerebral arteries patent on the left. Common origin of the right AICA and PICA Basilar patent to its distal aspect. Superior cerebellar arteries patent bilaterally. Patent P1 segments. PCAs  perfused to their distal aspects without stenosis. The bilateral posterior communicating arteries are diminutive but patent. Anatomic variants: None significant IMPRESSION: 1. Acute infarct in the right corona radiata, which measures up to 8 mm. 2. No intracranial large vessel occlusion or significant stenosis. These results will be called to the ordering clinician or representative by the Radiologist Assistant, and communication documented in the PACS or [employer]. Electronically Signed   By: Wiliam Ke M.D.   On: 11/04/2022 22:33   MR ANGIO HEAD WO CONTRAST  Result Date: 11/04/2022 CLINICAL DATA:  Left-sided numbness and weakness EXAM: MRI HEAD WITHOUT CONTRAST MRA HEAD WITHOUT CONTRAST TECHNIQUE: Multiplanar, multi-echo pulse sequences of the brain and surrounding structures were acquired without intravenous contrast. Angiographic images of the Circle of Willis were acquired using MRA technique without intravenous contrast.  COMPARISON:  No prior MRI available, correlation is made with CT head 11/04/2022 FINDINGS: MRI HEAD FINDINGS Brain: 8 mm focus of restricted diffusion with ADC correlate in the right corona radiata (series 2, image 30-31). This focus is associated with T2 hyperintense signal. No acute hemorrhage, mass, mass effect, or midline shift. No hydrocephalus or extra-axial collection. Normal craniocervical junction. Foci of hemosiderin deposition in the bilateral thalami and right posterior frontal lobe, likely sequela of prior hypertensive microhemorrhages. Remote lacunar infarcts in the bilateral basal ganglia and corona radiata. Scattered and confluent T2 hyperintense signal in the periventricular white matter, likely the sequela of mild-to-moderate chronic small vessel ischemic disease. Dilated perivascular spaces in the basal ganglia. Vascular: Normal arterial flow voids. Skull and upper cervical spine: Normal marrow signal. Sinuses/Orbits: Clear paranasal sinuses. No acute finding  in the orbits. Other: Fluid in the left mastoid air cells MRA HEAD FINDINGS Anterior circulation: Both internal carotid arteries are patent to the termini, without significant stenosis. A1 segments patent. Normal anterior communicating artery. Anterior cerebral arteries are patent to their distal aspects. No M1 stenosis or occlusion. Normal MCA bifurcations. Distal MCA branches perfused and symmetric. Posterior circulation: Vertebral arteries patent to the vertebrobasilar junction without stenosis. Posterior inferior cerebral arteries patent on the left. Common origin of the right AICA and PICA Basilar patent to its distal aspect. Superior cerebellar arteries patent bilaterally. Patent P1 segments. PCAs perfused to their distal aspects without stenosis. The bilateral posterior communicating arteries are diminutive but patent. Anatomic variants: None significant IMPRESSION: 1. Acute infarct in the right corona radiata, which measures up to 8 mm. 2. No intracranial large vessel occlusion or significant stenosis. These results will be called to the ordering clinician or representative by the Radiologist Assistant, and communication documented in the PACS or [employer]. Electronically Signed   By: Wiliam Ke M.D.   On: 11/04/2022 22:33   DG CHEST PORT 1 VIEW  Result Date: 11/04/2022 CLINICAL DATA:  TIA EXAM: PORTABLE CHEST 1 VIEW COMPARISON:  05/31/2013 FINDINGS: The heart size and mediastinal contours are within normal limits. Both lungs are clear. The visualized skeletal structures are unremarkable. IMPRESSION: No active disease. Electronically Signed   By: Alcide Clever M.D.   On: 11/04/2022 21:14   CT HEAD WO CONTRAST  Result Date: 11/04/2022 CLINICAL DATA:  Neuro deficit, acute, stroke suspected. Left leg numbness. EXAM: CT HEAD WITHOUT CONTRAST TECHNIQUE: Contiguous axial images were obtained from the base of the skull through the vertex without intravenous contrast. RADIATION DOSE REDUCTION: This  exam was performed according to the departmental dose-optimization program which includes automated exposure control, adjustment of the mA and/or kV according to patient size and/or use of iterative reconstruction technique. COMPARISON:  None Available. FINDINGS: Brain: No acute hemorrhage. 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 Cortical gray-white differentiation is preserved. No hydrocephalus or extra-axial collection. No mass effect or midline shift. Vascular: No hyperdense vessel or unexpected calcification. Skull: No calvarial fracture or suspicious bone lesion. Skull base is unremarkable. Sinuses/Orbits: Unremarkable. Other: None. IMPRESSION: 1. No acute intracranial abnormality. 2. Moderate chronic small-vessel disease. Electronically Signed   By: Orvan Falconer M.D.   On: 11/04/2022 14:49     PHYSICAL EXAM  Temp:  [98 F (36.7 C)-98.6 F (37 C)] 98.3 F (36.8 C) (05/29 1521) Pulse Rate:  [92-104] 104 (05/29 1521) Resp:  [16-19] 16 (05/29 1511) BP: (139-151)/(71-87) 151/71 (05/29 1521) SpO2:  [97 %-98 %] 98 % (05/29  1521)  General - Well nourished, well developed, in no apparent distress.  Ophthalmologic - fundi not visualized due to noncooperation.  Cardiovascular - Regular rhythm and rate.  Mental Status -  Level of arousal and orientation to time, place, and person were intact. Mild psychomotor slowing Language including expression, naming, repetition, comprehension was assessed and found intact. Fund of Knowledge was assessed and was intact.  Cranial Nerves II - XII - II - Visual field intact OU. III, IV, VI - Extraocular movements intact. V - Facial sensation intact bilaterally. VII - Facial movement intact bilaterally. VIII - Hearing & vestibular intact bilaterally. X - Palate elevates symmetrically. XI - Chin turning & shoulder shrug intact bilaterally. XII - Tongue protrusion intact.  Motor Strength  - The patient's strength was normal in all extremities and pronator drift was absent.  Bulk was normal and fasciculations were absent.   Motor Tone - Muscle tone was assessed at the neck and appendages and was normal.  Reflexes - The patient's reflexes were symmetrical in all extremities and he had no pathological reflexes.  Sensory - Light touch, temperature/pinprick were assessed and were symmetrical.    Coordination - The patient had normal movements in the hands and feet with no ataxia or dysmetria.  Tremor was absent.  Gait and Station - deferred.   ASSESSMENT/PLAN Mr. Randy [name] is a 72 y.o. male with history of HTN, HLD and DM admitted for left sided numbness and weakness, now resolved. No tPA given due to symptoms resolved.    Stroke:  right CR infarct likely secondary to small vessel disease source CT negative for acute finding MRA negative CUS unremarkable MRI  right small CR infarct 2D Echo  EF 50-55%  LDL 105 HgbA1c pending SCDs for VTE prophylaxis aspirin 81 mg daily and pletal  prior to admission, now on aspirin 81 mg daily and Brilinta (ticagrelor) 90 mg bid for 30 days and then back to ASA 81 and pletal.  Patient counseled to be compliant with his antithrombotic medications Ongoing aggressive stroke risk factor management Therapy recommendations:  outpt OT Disposition:  home  Diabetes HgbA1c pending goal < 7.0 Controlled Currently on insulin CBG monitoring SSI DM education and close PCP follow up  Hypertension Stable Normalized BP in 2-3 days Long term BP goal normotensive  Hyperlipidemia Home meds:  prava 80  LDL 105, goal < 70 Now on crestor 40 Continue statin at discharge  Other Stroke Risk Factors Advanced age  Other Active Problems AKI, Cre 1.17 Tachycardia   Hospital day # 0  Neurology will sign off. Please call with questions. Pt will follow up with stroke clinic NP at [facility] in about 4 weeks. Thanks for the consult.   Marvel Plan,  MD PhD Stroke Neurology 11/05/2022 11:59 PM    To contact Stroke Continuity provider, please refer to [email or web address]. After hours, contact General Neurology

## 2022-11-05 NOTE — Discharge Summary (Signed)
Physician Discharge Summary   Patient: Randy [name] MRN: [id number] DOB: [birth year]  Admit date:     11/04/2022  Discharge date: 11/05/22  Discharge Physician: Alberteen Sam   PCP: Dettinger, Elige Radon, MD     Recommendations at discharge:  Follow up with Dr. Louanne Skye PCP in 1 week for stroke Dr. Louanne Skye: Please review A1c 8.9% and adjust as appropriate  Follow up with Neurology in 6 weeks  Hold Pletal for 1 month; take Brilinta for 1 month, then resume Pletal     Discharge Diagnoses: Principal Problem:   CVA (cerebral vascular accident) (HCC) Active Problems:   Type 2 diabetes mellitus with other specified complication (HCC)   Essential hypertension, benign   Mixed hyperlipidemia   Hypokalemia    Hospital Course: Randy [name] is a 72 y.o. M with DM, HLD, HTN, and depression who presented with left leg weakness and numbness and found to have right corona radiata stroke.     Acute stroke MRI brain showed subcentimeter right corona radiata stroke - Non-invasive angiography showed no significant atherosclerotic disease - Echocardiogram showed no cardiogenic source of embolism - Carotid imaging unremarkable   - Lipids ordered, LDL 105: Discharged on continued Crestor 40, recommend evaluation for advanced therapies - Aspirin ordered at admission: Discharged on aspirin and Brilinta for 1 month, followed by aspirin and cilostazol indefinitely - Atrial fibrillation: Not found on monitoring - tPA not given because stroke severity to mild - Dysphagia screen ordered in ER - PT eval ordered: Home health - Nonsmoker   Diabetes Noted to have hemoglobin A1c 8.9%.  Continued on glargine and Trulicity at discharge, defer titration to PCP.  Essential hypertension Blood pressure controlled in the hospital on amlodipine, HCTZ, losartan         The [facility] Controlled Substances Registry was reviewed for this patient prior to discharge.   Consultants:  Neurology Procedures performed:  MRI brain CT head MR angiogram of the head Echocardiogram Carotid ultrasound   Disposition: Home health Diet recommendation:  Discharge Diet Orders (From admission, onward)     Start     Ordered   11/05/22 0000  Diet - low sodium heart healthy        11/05/22 1546             DISCHARGE MEDICATION: Allergies as of 11/05/2022   No Known Allergies      Medication List     STOP taking these medications    cilostazol 100 MG tablet Commonly known as: PLETAL       TAKE these medications    Accu-Chek Guide test strip Generic drug: glucose blood Use as instructed to test blood sugar daily. DX: E11.65 (patient using guide meter now)   amLODipine 10 MG tablet Commonly known as: NORVASC Take 1 tablet (10 mg total) by mouth daily.   aspirin EC 81 MG tablet Take 81 mg by mouth daily.   Basaglar KwikPen 100 UNIT/ML Inject 70 Units into the skin daily.   Brilinta 90 MG Tabs tablet Generic drug: ticagrelor Take 1 tablet (90 mg total) by mouth 2 (two) times daily.   citalopram 40 MG tablet Commonly known as: CELEXA Take 1 tablet (40 mg total) by mouth daily.   Dexcom G7 Receiver Devi 1 each by Does not apply route 4 (four) times daily.   Dexcom G7 Sensor Misc Change every 10 days   glucose monitoring kit monitoring kit 1 each by Does not apply route 2 (two) times daily before a  meal.   GNP UltiCare Pen Needles 32G X 6 MM Misc Generic drug: Insulin Pen Needle Use 3 times daily DX: E11.65   hydrochlorothiazide 25 MG tablet Commonly known as: HYDRODIURIL Take 1 tablet (25 mg total) by mouth daily.   hydrOXYzine 50 MG capsule Commonly known as: VISTARIL Take 1 capsule (50 mg total) by mouth 3 (three) times daily as needed.   insulin lispro 100 UNIT/ML KwikPen Commonly known as: HumaLOG KwikPen Inject 10 Units into the skin 3 (three) times daily with meals.   lidocaine 5 % ointment Commonly known as: XYLOCAINE Apply  1 application topically 3 (three) times daily as needed.   losartan 100 MG tablet Commonly known as: COZAAR Take 1 tablet (100 mg total) by mouth daily.   Potassium Chloride ER 20 MEQ Tbcr Take 20 mEq by mouth daily.   pravastatin 80 MG tablet Commonly known as: PRAVACHOL Take 1 tablet (80 mg total) by mouth daily.   QUEtiapine 50 MG tablet Commonly known as: SEROquel Take 1 tablet (50 mg total) by mouth at bedtime.   rosuvastatin 40 MG tablet Commonly known as: CRESTOR Take 1 tablet (40 mg total) by mouth daily. Start taking on: Nov 06, 2022   Trulicity 4.5 MG/0.5ML Sopn Generic drug: Dulaglutide Inject 4.5 mg as directed once a week. DX: E11.65        Follow-up Information     Dettinger, Elige Radon, MD. Schedule an appointment as soon as possible for a visit in 1 week(s).   Specialties: Family Medicine, Cardiology Contact information: [address] [place] [address] [phone]         [facility] Follow up in 2 month(s).   Contact information: [address]     [address] [place] [address] [phone]        [facility] Follow up.   Specialty: Rehabilitation Why: the outpatient rehab will contact you for the first appointment Contact information: [address] [id number] [place] [address] [phone]                Discharge Instructions     Ambulatory referral to Neurology   Complete by: As directed    An appointment is requested in approximately: 8 weeks Stroke follow up from hospital   Ambulatory referral to Occupational Therapy   Complete by: As directed    Diet - low sodium heart healthy   Complete by: As directed    Discharge instructions   Complete by: As directed    **IMPORTANT DISCHARGE INSTRUCTIONS**   From Dr. Maryfrances Bunnell: You were admitted for left leg weakness  Here, we found that this was from a small  stroke  We did an echo (ultrasound of your heart) that was normal, showed no cause of stroke We did imaging of your neck arteries and these also were normal  Likely your stroke was caused by high blood pressure and high blood sugar   To prevent another stroke: CONTINUE low dose aspirin 81 mg daily TAKE THE NEW MEDICINE Brilinta/ticagrelor 90 mg twice daily for 1 month Take this IN PLACE OF cilostazol (Do not take cilostazol for the next month, when you finish the Brilinta, resume the cilostazol)  ALSO START the new medicine Crestor/rosuvastatin Take this nightly, from now on  Take your blood pressure medicines every day  We found that your hemoglobin A1c (measure of diabetes control) was 8.9%, which shows not optimal control  Continue your home  insulins and Trulicity and go see your primary doctor in 1 week  Go see the Neurology doctors in 6-8 weeks at [facility]   Increase activity slowly   Complete by: As directed        Discharge Exam: Filed Weights   11/04/22 1346  Weight: 81.6 kg    General: Pt is alert, awake, not in acute distress Cardiovascular: RRR, nl S1-S2, no murmurs appreciated.   No LE edema.   Respiratory: Normal respiratory rate and rhythm.  CTAB without rales or wheezes. Abdominal: Abdomen soft and non-tender.  No distension or HSM.   Neuro/Psych: Strength symmetric in upper and lower extremities.  Judgment and insight appear normal.   Condition at discharge: good  The results of significant diagnostics from this hospitalization (including imaging, microbiology, ancillary and laboratory) are listed below for reference.   Imaging Studies: VAS US CAROTID (at [facility] and [facility] only)  Result Date: 11/05/2022 Carotid Arterial Duplex Study Patient Name:  LAVERN LANGLITZ  Date of Exam:   11/05/2022 Medical Rec #: [id number]         Accession #:    [id number] Date of Birth: 04-04-1951        Patient Gender: M Patient Age:   52 years Exam Location:  [facility] Procedure:      VAS US CAROTID Referring Phys: Jonny Ruiz DOUTOVA --------------------------------------------------------------------------------  Indications:       Acute infarct right corona radiata seen on MRI, left sided                    numbness and weakness. Risk Factors:      Hypertension, hyperlipidemia, Diabetes. Comparison Study:  No previous study. Performing Technologist: Christene Lye  Examination Guidelines: A complete evaluation includes B-mode imaging, spectral Doppler, color Doppler, and power Doppler as needed of all accessible portions of each vessel. Bilateral testing is considered an integral part of a complete examination. Limited examinations for reoccurring indications may be performed as noted.  Right Carotid Findings: +----------+--------+--------+--------+----------------------+--------+           PSV cm/sEDV cm/sStenosisPlaque Description    Comments +----------+--------+--------+--------+----------------------+--------+ CCA Prox  60      11                                             +----------+--------+--------+--------+----------------------+--------+ CCA Distal56      8                                              +----------+--------+--------+--------+----------------------+--------+ ICA Prox  57      14      1-39%   smooth and homogeneous         +----------+--------+--------+--------+----------------------+--------+ ICA Distal118     24                                    tortuous +----------+--------+--------+--------+----------------------+--------+ ECA       191     22                                             +----------+--------+--------+--------+----------------------+--------+ +----------+--------+-------+----------------+-------------------+  PSV cm/sEDV cmsDescribe        Arm Pressure (mmHG) +----------+--------+-------+----------------+-------------------+ [id number]            Multiphasic, WNL                     +----------+--------+-------+----------------+-------------------+ +---------+--------+--+--------+--+---------+ VertebralPSV cm/s48EDV cm/s11Antegrade +---------+--------+--+--------+--+---------+  Left Carotid Findings: +----------+-------+--------+--------+--------------------------------+--------+           PSV    EDV cm/sStenosisPlaque Description              Comments           cm/s                                                            +----------+-------+--------+--------+--------------------------------+--------+ CCA Prox  72     12                                                       +----------+-------+--------+--------+--------------------------------+--------+ CCA Distal65     9               irregular and heterogenous               +----------+-------+--------+--------+--------------------------------+--------+ ICA Prox  55     18      1-39%   homogeneous, irregular and                                                calcific                                 +----------+-------+--------+--------+--------------------------------+--------+ ICA Distal57     16                                                       +----------+-------+--------+--------+--------------------------------+--------+ ECA       142    13                                                       +----------+-------+--------+--------+--------------------------------+--------+ +----------+--------+--------+----------------+-------------------+           PSV cm/sEDV cm/sDescribe        Arm Pressure (mmHG) +----------+--------+--------+----------------+-------------------+ [id number]             Multiphasic, WNL                    +----------+--------+--------+----------------+-------------------+ +---------+--------+---+--------+--+---------+ VertebralPSV cm/s143EDV cm/s32Antegrade +---------+--------+---+--------+--+---------+    Summary: Right Carotid: Velocities in the right ICA are consistent with a 1-39% stenosis. Left Carotid: Velocities in the left ICA are consistent with a 1-39% stenosis. Vertebrals:  Bilateral vertebral arteries demonstrate antegrade flow. Subclavians: Normal flow  hemodynamics were seen in bilateral subclavian              arteries. *See table(s) above for measurements and observations.  Electronically signed by Delia Heady MD on 11/05/2022 at 5:03:39 PM.    Final    ECHOCARDIOGRAM COMPLETE  Result Date: 11/05/2022    ECHOCARDIOGRAM REPORT   Patient Name:   SATURNINO CURLING Date of Exam: 11/05/2022 Medical Rec #:  [id number]        Height:       69.0 in Accession #:    [id number]       Weight:       180.0 lb Date of Birth:  February 13, 1951       BSA:          1.976 m Patient Age:    71 years         BP:           146/74 mmHg Patient Gender: M                HR:           98 bpm. Exam Location:  Inpatient Procedure: 2D Echo, Color Doppler and Cardiac Doppler Indications:    TIA  History:        Patient has no prior history of Echocardiogram examinations.                 Stroke; Risk Factors:Diabetes, Hypertension, Dyslipidemia and                 Current Smoker.  Sonographer:    Milbert Coulter Referring Phys: [id number] ANASTASSIA DOUTOVA IMPRESSIONS  1. Left ventricular ejection fraction, by estimation, is 50 to 55%. The left ventricle has low normal function. Left ventricular endocardial border not optimally defined to evaluate regional wall motion. Indeterminate diastolic filling due to E-A fusion.  2. Right ventricular systolic function is normal. The right ventricular size is normal.  3. The mitral valve is abnormal. No evidence of mitral valve regurgitation.  4. The aortic valve was not well visualized. Aortic valve regurgitation is not visualized. No aortic stenosis is present.  5. The inferior vena cava is normal in size with greater than 50% respiratory variability, suggesting right atrial pressure of 3 mmHg.  6.  Cannot exclude a small PFO. Comparison(s): No prior Echocardiogram. FINDINGS  Left Ventricle: Left ventricular ejection fraction, by estimation, is 50 to 55%. The left ventricle has low normal function. Left ventricular endocardial border not optimally defined to evaluate regional wall motion. The left ventricular internal cavity  size was normal in size. Suboptimal image quality limits for assessment of left ventricular hypertrophy. Indeterminate diastolic filling due to E-A fusion. Right Ventricle: The right ventricular size is normal. No increase in right ventricular wall thickness. Right ventricular systolic function is normal. Left Atrium: Left atrial size was normal in size. Right Atrium: Right atrial size was normal in size. Pericardium: Trivial pericardial effusion is present. The pericardial effusion is anterior to the right ventricle. Presence of epicardial fat layer. Mitral Valve: The mitral valve is abnormal. No evidence of mitral valve regurgitation. Tricuspid Valve: The tricuspid valve is normal in structure. Tricuspid valve regurgitation is not demonstrated. No evidence of tricuspid stenosis. Aortic Valve: The aortic valve was not well visualized. Aortic valve regurgitation is not visualized. No aortic stenosis is present. Pulmonic Valve: The pulmonic valve was not well visualized. Pulmonic valve regurgitation is not visualized. No evidence of pulmonic stenosis. Aorta: The aortic root is normal  in size and structure and the ascending aorta was not well visualized. Venous: The inferior vena cava is normal in size with greater than 50% respiratory variability, suggesting right atrial pressure of 3 mmHg. IAS/Shunts: Cannot exclude a small PFO.  LEFT VENTRICLE PLAX 2D LVIDd:         4.40 cm     Diastology LVIDs:         3.20 cm     LV e' medial:    7.04 cm/s LV PW:         1.50 cm     LV E/e' medial:  8.3 LV IVS:        1.50 cm     LV e' lateral:   8.36 cm/s LVOT diam:     2.20 cm     LV E/e' lateral: 7.0  LVOT Area:     3.80 cm  LV Volumes (MOD) LV vol d, MOD A2C: 56.0 ml LV vol d, MOD A4C: 46.6 ml LV vol s, MOD A2C: 27.4 ml LV vol s, MOD A4C: 22.2 ml LV SV MOD A2C:     28.6 ml LV SV MOD A4C:     46.6 ml LV SV MOD BP:      26.3 ml RIGHT VENTRICLE RV S prime:     18.30 cm/s TAPSE (M-mode): 2.3 cm LEFT ATRIUM             Index        RIGHT ATRIUM           Index LA diam:        3.70 cm 1.87 cm/m   RA Area:     13.60 cm LA Vol (A2C):   40.9 ml 20.70 ml/m  RA Volume:   32.40 ml  16.40 ml/m LA Vol (A4C):   48.7 ml 24.65 ml/m LA Biplane Vol: 45.0 ml 22.78 ml/m   AORTA Ao Root diam: 3.30 cm MITRAL VALVE MV Area (PHT): 4.96 cm    SHUNTS MV Decel Time: 153 msec    Systemic Diam: 2.20 cm MV E velocity: 58.70 cm/s MV A velocity: 97.70 cm/s MV E/A ratio:  0.60 Riley Lam MD Electronically signed by Riley Lam MD Signature Date/Time: 11/05/2022/12:12:23 PM    Final    MR BRAIN WO CONTRAST  Result Date: 11/04/2022 CLINICAL DATA:  Left-sided numbness and weakness EXAM: MRI HEAD WITHOUT CONTRAST MRA HEAD WITHOUT CONTRAST TECHNIQUE: Multiplanar, multi-echo pulse sequences of the brain and surrounding structures were acquired without intravenous contrast. Angiographic images of the Circle of Willis were acquired using MRA technique without intravenous contrast. COMPARISON:  No prior MRI available, correlation is made with CT head 11/04/2022 FINDINGS: MRI HEAD FINDINGS Brain: 8 mm focus of restricted diffusion with ADC correlate in the right corona radiata (series 2, image 30-31). This focus is associated with T2 hyperintense signal. No acute hemorrhage, mass, mass effect, or midline shift. No hydrocephalus or extra-axial collection. Normal craniocervical junction. Foci of hemosiderin deposition in the bilateral thalami and right posterior frontal lobe, likely sequela of prior hypertensive microhemorrhages. Remote lacunar infarcts in the bilateral basal ganglia and corona radiata. Scattered and confluent  T2 hyperintense signal in the periventricular white matter, likely the sequela of mild-to-moderate chronic small vessel ischemic disease. Dilated perivascular spaces in the basal ganglia. Vascular: Normal arterial flow voids. Skull and upper cervical spine: Normal marrow signal. Sinuses/Orbits: Clear paranasal sinuses. No acute finding in the orbits. Other: Fluid in the left mastoid air cells MRA HEAD FINDINGS Anterior circulation:  Both internal carotid arteries are patent to the termini, without significant stenosis. A1 segments patent. Normal anterior communicating artery. Anterior cerebral arteries are patent to their distal aspects. No M1 stenosis or occlusion. Normal MCA bifurcations. Distal MCA branches perfused and symmetric. Posterior circulation: Vertebral arteries patent to the vertebrobasilar junction without stenosis. Posterior inferior cerebral arteries patent on the left. Common origin of the right AICA and PICA Basilar patent to its distal aspect. Superior cerebellar arteries patent bilaterally. Patent P1 segments. PCAs perfused to their distal aspects without stenosis. The bilateral posterior communicating arteries are diminutive but patent. Anatomic variants: None significant IMPRESSION: 1. Acute infarct in the right corona radiata, which measures up to 8 mm. 2. No intracranial large vessel occlusion or significant stenosis. These results will be called to the ordering clinician or representative by the Radiologist Assistant, and communication documented in the PACS or [employer]. Electronically Signed   By: Wiliam Ke M.D.   On: 11/04/2022 22:33   MR ANGIO HEAD WO CONTRAST  Result Date: 11/04/2022 CLINICAL DATA:  Left-sided numbness and weakness EXAM: MRI HEAD WITHOUT CONTRAST MRA HEAD WITHOUT CONTRAST TECHNIQUE: Multiplanar, multi-echo pulse sequences of the brain and surrounding structures were acquired without intravenous contrast. Angiographic images of the Circle of Willis were  acquired using MRA technique without intravenous contrast. COMPARISON:  No prior MRI available, correlation is made with CT head 11/04/2022 FINDINGS: MRI HEAD FINDINGS Brain: 8 mm focus of restricted diffusion with ADC correlate in the right corona radiata (series 2, image 30-31). This focus is associated with T2 hyperintense signal. No acute hemorrhage, mass, mass effect, or midline shift. No hydrocephalus or extra-axial collection. Normal craniocervical junction. Foci of hemosiderin deposition in the bilateral thalami and right posterior frontal lobe, likely sequela of prior hypertensive microhemorrhages. Remote lacunar infarcts in the bilateral basal ganglia and corona radiata. Scattered and confluent T2 hyperintense signal in the periventricular white matter, likely the sequela of mild-to-moderate chronic small vessel ischemic disease. Dilated perivascular spaces in the basal ganglia. Vascular: Normal arterial flow voids. Skull and upper cervical spine: Normal marrow signal. Sinuses/Orbits: Clear paranasal sinuses. No acute finding in the orbits. Other: Fluid in the left mastoid air cells MRA HEAD FINDINGS Anterior circulation: Both internal carotid arteries are patent to the termini, without significant stenosis. A1 segments patent. Normal anterior communicating artery. Anterior cerebral arteries are patent to their distal aspects. No M1 stenosis or occlusion. Normal MCA bifurcations. Distal MCA branches perfused and symmetric. Posterior circulation: Vertebral arteries patent to the vertebrobasilar junction without stenosis. Posterior inferior cerebral arteries patent on the left. Common origin of the right AICA and PICA Basilar patent to its distal aspect. Superior cerebellar arteries patent bilaterally. Patent P1 segments. PCAs perfused to their distal aspects without stenosis. The bilateral posterior communicating arteries are diminutive but patent. Anatomic variants: None significant IMPRESSION: 1. Acute  infarct in the right corona radiata, which measures up to 8 mm. 2. No intracranial large vessel occlusion or significant stenosis. These results will be called to the ordering clinician or representative by the Radiologist Assistant, and communication documented in the PACS or [employer]. Electronically Signed   By: Wiliam Ke M.D.   On: 11/04/2022 22:33   DG CHEST PORT 1 VIEW  Result Date: 11/04/2022 CLINICAL DATA:  TIA EXAM: PORTABLE CHEST 1 VIEW COMPARISON:  05/31/2013 FINDINGS: The heart size and mediastinal contours are within normal limits. Both lungs are clear. The visualized skeletal structures are unremarkable. IMPRESSION: No active disease. Electronically Signed   By:  Alcide Clever M.D.   On: 11/04/2022 21:14   CT HEAD WO CONTRAST  Result Date: 11/04/2022 CLINICAL DATA:  Neuro deficit, acute, stroke suspected. Left leg numbness. EXAM: CT HEAD WITHOUT CONTRAST TECHNIQUE: Contiguous axial images were obtained from the base of the skull through the vertex without intravenous contrast. RADIATION DOSE REDUCTION: This exam was performed according to the departmental dose-optimization program which includes automated exposure control, adjustment of the mA and/or kV according to patient size and/or use of iterative reconstruction technique. COMPARISON:  None Available. FINDINGS: Brain: No acute hemorrhage. 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 Cortical gray-white differentiation is preserved. No hydrocephalus or extra-axial collection. No mass effect or midline shift. Vascular: No hyperdense vessel or unexpected calcification. Skull: No calvarial fracture or suspicious bone lesion. Skull base is unremarkable. Sinuses/Orbits: Unremarkable. Other: None. IMPRESSION: 1. No acute intracranial abnormality. 2. Moderate chronic small-vessel disease. Electronically Signed   By: Orvan Falconer M.D.   On: 11/04/2022 14:49     Microbiology: No results found for this or any previous visit.  Labs: CBC: Recent Labs  Lab 11/04/22 1417 11/04/22 2230 11/05/22 0606  WBC 9.7 10.8* 7.6  NEUTROABS 7.2 7.8*  --   HGB 14.0 15.8 14.2  HCT 38.3* 43.7 39.5  MCV 93.0 92.4 94.3  PLT 201 220 178   Basic Metabolic Panel: Recent Labs  Lab 11/04/22 1417 11/04/22 2230 11/05/22 0606  NA 133* 136 136  K 3.4* 3.4* 3.0*  CL 96* 99 102  CO2 25 25 26   GLUCOSE 401* 254* 175*  BUN 29* 19 17  CREATININE 1.67* 1.22 1.17  CALCIUM 9.0 9.5 8.7*  MG  --  1.9  --   PHOS  --  3.1  --    Liver Function Tests: Recent Labs  Lab 11/04/22 1417 11/04/22 2230 11/05/22 0606  AST 13* 21 15  ALT 12 18 16   ALKPHOS 74 78 65  BILITOT 0.6 0.9 0.6  PROT 6.5 7.1 5.9*  ALBUMIN 4.4 4.2 3.3*   CBG: Recent Labs  Lab 11/04/22 1751 11/04/22 2330 11/05/22 0336 11/05/22 0742 11/05/22 1221  GLUCAP 208* 242* 204* 164* 236*    Discharge time spent: approximately 35 minutes spent on discharge counseling, evaluation of patient on day of discharge, and coordination of discharge planning with nursing, social work, pharmacy and case management  Signed: Alberteen Sam, MD [facility] 11/05/2022

## 2022-11-05 NOTE — Progress Notes (Signed)
Bilateral carotid ultrasound study completed.   Please see CV Procedures for preliminary results.  Niasha Devins, RVT  11:02 AM 11/05/22

## 2022-11-05 NOTE — Evaluation (Addendum)
Physical Therapy Evaluation and Discharge Patient Details Name: JOHNANDREW HUCKER MRN: [id number] DOB: [birth year] Today's Date: 11/05/2022  History of Present Illness  PHILIP TISBY is a 72 y.o. male who presented 11/04/2022 with sudden onset of left-sided numbness and weakness. 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 PMHx: diabetes, hypertension hyperlipidemia.  Clinical Impression  Patient evaluated by Physical Therapy with no further acute PT needs identified. 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 Pt appears to be fairly close to his functional baseline. Ambulating greater than household distances without an assistive device and negotiated 2 steps without physical difficulty. Scoring 20/24 on the DGI, indicating he is not at high risk for falls. Education provided regarding BEFAST stroke symptoms. All education has been completed and the patient has no further questions. No follow-up Physical Therapy or equipment needs. PT is signing off. Thank you for this referral.      Recommendations for follow up therapy are one component of a multi-disciplinary discharge planning process, led by the attending physician.  Recommendations may be updated based on patient status, additional functional criteria and insurance authorization.  Follow Up Recommendations       Assistance Recommended at Discharge None  Patient can return home with the following       Equipment Recommendations None recommended by PT  Recommendations for Other Services       Functional Status Assessment Patient has not had a recent decline in their functional status     Precautions / Restrictions Precautions Precautions: None Restrictions Weight Bearing Restrictions: No      Mobility  Bed Mobility               General bed mobility comments: Received sitting EOB    Transfers Overall transfer level: Independent Equipment used: None                     Ambulation/Gait Ambulation/Gait assistance: Modified independent (Device/Increase time) Gait Distance (Feet): 400 Feet Assistive device: None Gait Pattern/deviations: Step-through pattern, Decreased stride length, Wide base of support       General Gait Details: Slightly increased bilateral foot external rotation (likely baseline), no gross LOB with higher level balance challenge  Stairs Stairs: Yes Stairs assistance: Modified independent (Device/Increase time) Stair Management: Two rails Number of Stairs: 2 General stair comments: step over step ascending, step by step descending  Wheelchair Mobility    Modified Rankin (Stroke Patients Only) Modified Rankin (Stroke Patients Only) Pre-Morbid Rankin Score: No symptoms Modified Rankin: No symptoms     Balance Overall balance assessment: No apparent balance deficits (not formally assessed)                               Standardized Balance Assessment Standardized Balance Assessment : Dynamic Gait Index   Dynamic Gait Index Level Surface: Normal Change in Gait Speed: Mild Impairment Gait with Horizontal Head Turns: Normal Gait with Vertical Head Turns: Normal Gait and Pivot Turn: Mild Impairment Step Over Obstacle: Mild Impairment Step Around Obstacles: Normal Steps: Mild Impairment Total Score: 20       Pertinent Vitals/Pain Pain Assessment Pain Assessment: No/denies pain    Home Living Family/patient expects to be discharged to:: Private residence Living Arrangements: Alone   Type of Home: House Home Access: Stairs to enter   Secretary/administrator of Steps: 1   Home Layout: One level Home Equipment: Cane - single point  Prior Function Prior Level of Function : Independent/Modified Independent;Driving             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 Dominance   Dominant Hand: Right    Extremity/Trunk Assessment   Upper Extremity Assessment Upper  Extremity Assessment: Overall WFL for tasks assessed    Lower Extremity Assessment Lower Extremity Assessment: RLE deficits/detail;LLE deficits/detail RLE Deficits / Details: Strength 5/5 RLE Coordination: WNL LLE Deficits / Details: Strength 5/5 LLE Coordination: WNL    Cervical / Trunk Assessment Cervical / Trunk Assessment: Normal  Communication   Communication: No difficulties  Cognition Arousal/Alertness: Awake/alert Behavior During Therapy: Flat affect Overall Cognitive Status: Within Functional Limits for tasks assessed                                          General Comments      Exercises     Assessment/Plan    PT Assessment Patient does not need any further PT services  PT Problem List         PT Treatment Interventions      PT Goals (Current goals can be found in the Care Plan section)  Acute Rehab PT Goals Patient Stated Goal: to go home PT Goal Formulation: All assessment and education complete, DC therapy    Frequency       Co-evaluation               AM-PAC PT "6 Clicks" Mobility  Outcome Measure Help needed turning from your back to your side while in a flat bed without using bedrails?: None Help needed moving from lying on your back to sitting on the side of a flat bed without using bedrails?: None Help needed moving to and from a bed to a chair (including a wheelchair)?: None Help needed standing up from a chair using your arms (e.g., wheelchair or bedside chair)?: None Help needed to walk in hospital room?: None Help needed climbing 3-5 steps with a railing? : None 6 Click Score: 24    End of Session Equipment Utilized During Treatment: Gait belt Activity Tolerance: Patient tolerated treatment well   Nurse Communication: Mobility status;Other (comment) (pt independent) PT Visit Diagnosis: Unsteadiness on feet (R26.81)    Time: 4098-1191 PT Time Calculation (min) (ACUTE ONLY): 21 min   Charges:   PT  Evaluation $PT Eval Low Complexity: 1 Low          Lillia Pauls, PT, DPT Acute Rehabilitation Services Office [phone]   Norval Morton 11/05/2022, 8:34 AM

## 2022-11-05 NOTE — TOC Transition Note (Signed)
Transition of Care [facility]) - CM/SW Discharge Note   Patient Details  Name: Randy [name] MRN: [id number] Date of Birth: [birth year]  [facility] CM/SW Contact:  Kermit Balo, RN Phone Number: 11/05/2022, 4:47 PM   Clinical Narrative:    Pt is discharging  home with outpatient occupational therapy through [facility]. Information on the AVS.  Pt has transportation home.  Brilinta will be $0 co pay through [facility] pharmacy with the 30 day free card.    Final next level of care: OP Rehab Barriers to Discharge: No Barriers Identified   Patient Goals and CMS Choice   Choice offered to / list presented to : Patient  Discharge Placement                         Discharge Plan and Services Additional resources added to the After Visit Summary for     Discharge Planning Services: CM Consult                                 Social Determinants of Health (SDOH) Interventions SDOH Screenings   Food Insecurity: No Food Insecurity (11/04/2022)  Housing: Low Risk  (11/04/2022)  Transportation Needs: No Transportation Needs (11/04/2022)  Utilities: Not At Risk (11/04/2022)  Alcohol Screen: Low Risk  (08/09/2019)  Depression (PHQ2-9): Low Risk  (10/29/2022)  Financial Resource Strain: Low Risk  (05/19/2022)  Physical Activity: Unknown (05/19/2022)  Social Connections: Moderately Integrated (05/19/2022)  Stress: Stress Concern Present (05/19/2022)  Tobacco Use: Medium Risk (11/04/2022)     Readmission Risk Interventions     No data to display

## 2022-11-05 NOTE — Progress Notes (Incomplete)
STROKE TEAM PROGRESS NOTE   SUBJECTIVE (INTERVAL HISTORY) No family is at the bedside.  Overall his condition is completely resolved. He stated that he felt he is back to baseline. He is taking ASA and pletal at home, will recommend ASA and brilinta for 30 days and then back to home meds.    OBJECTIVE Temp:  [98 F (36.7 C)-98.6 F (37 C)] 98.3 F (36.8 C) (05/29 1521) Pulse Rate:  [92-104] 104 (05/29 1521) Cardiac Rhythm: Normal sinus rhythm (05/29 0736) Resp:  [16-19] 16 (05/29 1511) BP: (139-151)/(71-87) 151/71 (05/29 1521) SpO2:  [97 %-98 %] 98 % (05/29 1521)  Recent Labs  Lab 11/04/22 1751 11/04/22 2330 11/05/22 0336 11/05/22 0742 11/05/22 1221  GLUCAP 208* 242* 204* 164* 236*   Recent Labs  Lab 11/04/22 1417 11/04/22 2230 11/05/22 0606  NA 133* 136 136  K 3.4* 3.4* 3.0*  CL 96* 99 102  CO2 25 25 26   GLUCOSE 401* 254* 175*  BUN 29* 19 17  CREATININE 1.67* 1.22 1.17  CALCIUM 9.0 9.5 8.7*  MG  --  1.9  --   PHOS  --  3.1  --    Recent Labs  Lab 11/04/22 1417 11/04/22 2230 11/05/22 0606  AST 13* 21 15  ALT 12 18 16   ALKPHOS 74 78 65  BILITOT 0.6 0.9 0.6  PROT 6.5 7.1 5.9*  ALBUMIN 4.4 4.2 3.3*   Recent Labs  Lab 11/04/22 1417 11/04/22 2230 11/05/22 0606  WBC 9.7 10.8* 7.6  NEUTROABS 7.2 7.8*  --   HGB 14.0 15.8 14.2  HCT 38.3* 43.7 39.5  MCV 93.0 92.4 94.3  PLT 201 220 178   Recent Labs  Lab 11/05/22 0606  CKTOTAL 59   Recent Labs    11/04/22 1417  LABPROT 13.1  INR 1.0   Recent Labs    11/04/22 2230  COLORURINE STRAW*  LABSPEC 1.005  PHURINE 7.0  GLUCOSEU 150*  HGBUR SMALL*  BILIRUBINUR NEGATIVE  KETONESUR NEGATIVE  PROTEINUR NEGATIVE  NITRITE NEGATIVE  LEUKOCYTESUR NEGATIVE       Component Value Date/Time   CHOL 189 11/04/2022 2230   CHOL 152 07/09/2022 0837   TRIG 172 (H) 11/04/2022 2230   HDL 50 11/04/2022 2230   HDL 49 07/09/2022 0837   CHOLHDL 3.8 11/04/2022 2230   VLDL 34 11/04/2022 2230   LDLCALC 105 (H)  11/04/2022 2230   LDLCALC 84 07/09/2022 0837   Lab Results  Component Value Date   HGBA1C 8.9 (H) 10/29/2022      Component Value Date/Time   LABOPIA NONE DETECTED 11/04/2022 2231   COCAINSCRNUR NONE DETECTED 11/04/2022 2231   LABBENZ NONE DETECTED 11/04/2022 2231   AMPHETMU NONE DETECTED 11/04/2022 2231   THCU NONE DETECTED 11/04/2022 2231   LABBARB NONE DETECTED 11/04/2022 2231    Recent Labs  Lab 11/04/22 1417 11/04/22 2230  ETH <10 <10    I have personally reviewed the radiological images below and agree with the radiology interpretations.  VAS US CAROTID (at [facility] and [facility] only)  Result Date: 11/05/2022 Carotid Arterial Duplex Study Patient Name:  Randy [name]  Date of Exam:   11/05/2022 Medical Rec #: [id number]         Accession #:    [id number] Date of Birth: 1951/06/04        Patient Gender: M Patient Age:   72 years Exam Location:  [facility] Procedure:      VAS US CAROTID Referring Phys: Jonny Ruiz DOUTOVA --------------------------------------------------------------------------------  Indications:       Acute infarct right corona radiata seen on MRI, left sided                    numbness and weakness. Risk Factors:      Hypertension, hyperlipidemia, Diabetes. Comparison Study:  No previous study. Performing Technologist: Christene Lye  Examination Guidelines: A complete evaluation includes B-mode imaging, spectral Doppler, color Doppler, and power Doppler as needed of all accessible portions of each vessel. Bilateral testing is considered an integral part of a complete examination. Limited examinations for reoccurring indications may be performed as noted.  Right Carotid Findings: +----------+--------+--------+--------+----------------------+--------+           PSV cm/sEDV cm/sStenosisPlaque Description    Comments +----------+--------+--------+--------+----------------------+--------+ CCA Prox  60      11                                              +----------+--------+--------+--------+----------------------+--------+ CCA Distal56      8                                              +----------+--------+--------+--------+----------------------+--------+ ICA Prox  57      14      1-39%   smooth and homogeneous         +----------+--------+--------+--------+----------------------+--------+ ICA Distal118     24                                    tortuous +----------+--------+--------+--------+----------------------+--------+ ECA       191     22                                             +----------+--------+--------+--------+----------------------+--------+ +----------+--------+-------+----------------+-------------------+           PSV cm/sEDV cmsDescribe        Arm Pressure (mmHG) +----------+--------+-------+----------------+-------------------+ [id number]            Multiphasic, WNL                    +----------+--------+-------+----------------+-------------------+ +---------+--------+--+--------+--+---------+ VertebralPSV cm/s48EDV cm/s11Antegrade +---------+--------+--+--------+--+---------+  Left Carotid Findings: +----------+-------+--------+--------+--------------------------------+--------+           PSV    EDV cm/sStenosisPlaque Description              Comments           cm/s                                                            +----------+-------+--------+--------+--------------------------------+--------+ CCA Prox  72     12                                                       +----------+-------+--------+--------+--------------------------------+--------+  CCA Distal65     9               irregular and heterogenous               +----------+-------+--------+--------+--------------------------------+--------+ ICA Prox  55     18      1-39%   homogeneous, irregular and                                                calcific                                  +----------+-------+--------+--------+--------------------------------+--------+ ICA Distal57     16                                                       +----------+-------+--------+--------+--------------------------------+--------+ ECA       142    13                                                       +----------+-------+--------+--------+--------------------------------+--------+ +----------+--------+--------+----------------+-------------------+           PSV cm/sEDV cm/sDescribe        Arm Pressure (mmHG) +----------+--------+--------+----------------+-------------------+ [id number]             Multiphasic, WNL                    +----------+--------+--------+----------------+-------------------+ +---------+--------+---+--------+--+---------+ VertebralPSV cm/s143EDV cm/s32Antegrade +---------+--------+---+--------+--+---------+   Summary: Right Carotid: Velocities in the right ICA are consistent with a 1-39% stenosis. Left Carotid: Velocities in the left ICA are consistent with a 1-39% stenosis. Vertebrals:  Bilateral vertebral arteries demonstrate antegrade flow. Subclavians: Normal flow hemodynamics were seen in bilateral subclavian              arteries. *See table(s) above for measurements and observations.  Electronically signed by Delia Heady MD on 11/05/2022 at 5:03:39 PM.    Final    ECHOCARDIOGRAM COMPLETE  Result Date: 11/05/2022    ECHOCARDIOGRAM REPORT   Patient Name:   Randy [name] Date of Exam: 11/05/2022 Medical Rec #:  [id number]        Height:       69.0 in Accession #:    [id number]       Weight:       180.0 lb Date of Birth:  1950-06-25       BSA:          1.976 m Patient Age:    71 years         BP:           146/74 mmHg Patient Gender: M                HR:           98 bpm. Exam Location:  Inpatient Procedure: 2D Echo, Color Doppler and Cardiac Doppler Indications:    TIA  History:        Patient has no prior history  of Echocardiogram  examinations.                 Stroke; Risk Factors:Diabetes, Hypertension, Dyslipidemia and                 Current Smoker.  Sonographer:    Milbert Coulter Referring Phys: [id number] ANASTASSIA DOUTOVA IMPRESSIONS  1. Left ventricular ejection fraction, by estimation, is 50 to 55%. The left ventricle has low normal function. Left ventricular endocardial border not optimally defined to evaluate regional wall motion. Indeterminate diastolic filling due to E-A fusion.  2. Right ventricular systolic function is normal. The right ventricular size is normal.  3. The mitral valve is abnormal. No evidence of mitral valve regurgitation.  4. The aortic valve was not well visualized. Aortic valve regurgitation is not visualized. No aortic stenosis is present.  5. The inferior vena cava is normal in size with greater than 50% respiratory variability, suggesting right atrial pressure of 3 mmHg.  6. Cannot exclude a small PFO. Comparison(s): No prior Echocardiogram. FINDINGS  Left Ventricle: Left ventricular ejection fraction, by estimation, is 50 to 55%. The left ventricle has low normal function. Left ventricular endocardial border not optimally defined to evaluate regional wall motion. The left ventricular internal cavity  size was normal in size. Suboptimal image quality limits for assessment of left ventricular hypertrophy. Indeterminate diastolic filling due to E-A fusion. Right Ventricle: The right ventricular size is normal. No increase in right ventricular wall thickness. Right ventricular systolic function is normal. Left Atrium: Left atrial size was normal in size. Right Atrium: Right atrial size was normal in size. Pericardium: Trivial pericardial effusion is present. The pericardial effusion is anterior to the right ventricle. Presence of epicardial fat layer. Mitral Valve: The mitral valve is abnormal. No evidence of mitral valve regurgitation. Tricuspid Valve: The tricuspid valve is normal in structure. Tricuspid valve  regurgitation is not demonstrated. No evidence of tricuspid stenosis. Aortic Valve: The aortic valve was not well visualized. Aortic valve regurgitation is not visualized. No aortic stenosis is present. Pulmonic Valve: The pulmonic valve was not well visualized. Pulmonic valve regurgitation is not visualized. No evidence of pulmonic stenosis. Aorta: The aortic root is normal in size and structure and the ascending aorta was not well visualized. Venous: The inferior vena cava is normal in size with greater than 50% respiratory variability, suggesting right atrial pressure of 3 mmHg. IAS/Shunts: Cannot exclude a small PFO.  LEFT VENTRICLE PLAX 2D LVIDd:         4.40 cm     Diastology LVIDs:         3.20 cm     LV e' medial:    7.04 cm/s LV PW:         1.50 cm     LV E/e' medial:  8.3 LV IVS:        1.50 cm     LV e' lateral:   8.36 cm/s LVOT diam:     2.20 cm     LV E/e' lateral: 7.0 LVOT Area:     3.80 cm  LV Volumes (MOD) LV vol d, MOD A2C: 56.0 ml LV vol d, MOD A4C: 46.6 ml LV vol s, MOD A2C: 27.4 ml LV vol s, MOD A4C: 22.2 ml LV SV MOD A2C:     28.6 ml LV SV MOD A4C:     46.6 ml LV SV MOD BP:      26.3 ml RIGHT VENTRICLE RV S prime:     18.30  cm/s TAPSE (M-mode): 2.3 cm LEFT ATRIUM             Index        RIGHT ATRIUM           Index LA diam:        3.70 cm 1.87 cm/m   RA Area:     13.60 cm LA Vol (A2C):   40.9 ml 20.70 ml/m  RA Volume:   32.40 ml  16.40 ml/m LA Vol (A4C):   48.7 ml 24.65 ml/m LA Biplane Vol: 45.0 ml 22.78 ml/m   AORTA Ao Root diam: 3.30 cm MITRAL VALVE MV Area (PHT): 4.96 cm    SHUNTS MV Decel Time: 153 msec    Systemic Diam: 2.20 cm MV E velocity: 58.70 cm/s MV A velocity: 97.70 cm/s MV E/A ratio:  0.60 Riley Lam MD Electronically signed by Riley Lam MD Signature Date/Time: 11/05/2022/12:12:23 PM    Final    MR BRAIN WO CONTRAST  Result Date: 11/04/2022 CLINICAL DATA:  Left-sided numbness and weakness EXAM: MRI HEAD WITHOUT CONTRAST MRA HEAD WITHOUT CONTRAST  TECHNIQUE: Multiplanar, multi-echo pulse sequences of the brain and surrounding structures were acquired without intravenous contrast. Angiographic images of the Circle of Willis were acquired using MRA technique without intravenous contrast. COMPARISON:  No prior MRI available, correlation is made with CT head 11/04/2022 FINDINGS: MRI HEAD FINDINGS Brain: 8 mm focus of restricted diffusion with ADC correlate in the right corona radiata (series 2, image 30-31). This focus is associated with T2 hyperintense signal. No acute hemorrhage, mass, mass effect, or midline shift. No hydrocephalus or extra-axial collection. Normal craniocervical junction. Foci of hemosiderin deposition in the bilateral thalami and right posterior frontal lobe, likely sequela of prior hypertensive microhemorrhages. Remote lacunar infarcts in the bilateral basal ganglia and corona radiata. Scattered and confluent T2 hyperintense signal in the periventricular white matter, likely the sequela of mild-to-moderate chronic small vessel ischemic disease. Dilated perivascular spaces in the basal ganglia. Vascular: Normal arterial flow voids. Skull and upper cervical spine: Normal marrow signal. Sinuses/Orbits: Clear paranasal sinuses. No acute finding in the orbits. Other: Fluid in the left mastoid air cells MRA HEAD FINDINGS Anterior circulation: Both internal carotid arteries are patent to the termini, without significant stenosis. A1 segments patent. Normal anterior communicating artery. Anterior cerebral arteries are patent to their distal aspects. No M1 stenosis or occlusion. Normal MCA bifurcations. Distal MCA branches perfused and symmetric. Posterior circulation: Vertebral arteries patent to the vertebrobasilar junction without stenosis. Posterior inferior cerebral arteries patent on the left. Common origin of the right AICA and PICA Basilar patent to its distal aspect. Superior cerebellar arteries patent bilaterally. Patent P1 segments. PCAs  perfused to their distal aspects without stenosis. The bilateral posterior communicating arteries are diminutive but patent. Anatomic variants: None significant IMPRESSION: 1. Acute infarct in the right corona radiata, which measures up to 8 mm. 2. No intracranial large vessel occlusion or significant stenosis. These results will be called to the ordering clinician or representative by the Radiologist Assistant, and communication documented in the PACS or [employer]. Electronically Signed   By: Wiliam Ke M.D.   On: 11/04/2022 22:33   MR ANGIO HEAD WO CONTRAST  Result Date: 11/04/2022 CLINICAL DATA:  Left-sided numbness and weakness EXAM: MRI HEAD WITHOUT CONTRAST MRA HEAD WITHOUT CONTRAST TECHNIQUE: Multiplanar, multi-echo pulse sequences of the brain and surrounding structures were acquired without intravenous contrast. Angiographic images of the Circle of Willis were acquired using MRA technique without intravenous contrast.  COMPARISON:  No prior MRI available, correlation is made with CT head 11/04/2022 FINDINGS: MRI HEAD FINDINGS Brain: 8 mm focus of restricted diffusion with ADC correlate in the right corona radiata (series 2, image 30-31). This focus is associated with T2 hyperintense signal. No acute hemorrhage, mass, mass effect, or midline shift. No hydrocephalus or extra-axial collection. Normal craniocervical junction. Foci of hemosiderin deposition in the bilateral thalami and right posterior frontal lobe, likely sequela of prior hypertensive microhemorrhages. Remote lacunar infarcts in the bilateral basal ganglia and corona radiata. Scattered and confluent T2 hyperintense signal in the periventricular white matter, likely the sequela of mild-to-moderate chronic small vessel ischemic disease. Dilated perivascular spaces in the basal ganglia. Vascular: Normal arterial flow voids. Skull and upper cervical spine: Normal marrow signal. Sinuses/Orbits: Clear paranasal sinuses. No acute finding  in the orbits. Other: Fluid in the left mastoid air cells MRA HEAD FINDINGS Anterior circulation: Both internal carotid arteries are patent to the termini, without significant stenosis. A1 segments patent. Normal anterior communicating artery. Anterior cerebral arteries are patent to their distal aspects. No M1 stenosis or occlusion. Normal MCA bifurcations. Distal MCA branches perfused and symmetric. Posterior circulation: Vertebral arteries patent to the vertebrobasilar junction without stenosis. Posterior inferior cerebral arteries patent on the left. Common origin of the right AICA and PICA Basilar patent to its distal aspect. Superior cerebellar arteries patent bilaterally. Patent P1 segments. PCAs perfused to their distal aspects without stenosis. The bilateral posterior communicating arteries are diminutive but patent. Anatomic variants: None significant IMPRESSION: 1. Acute infarct in the right corona radiata, which measures up to 8 mm. 2. No intracranial large vessel occlusion or significant stenosis. These results will be called to the ordering clinician or representative by the Radiologist Assistant, and communication documented in the PACS or [employer]. Electronically Signed   By: Wiliam Ke M.D.   On: 11/04/2022 22:33   DG CHEST PORT 1 VIEW  Result Date: 11/04/2022 CLINICAL DATA:  TIA EXAM: PORTABLE CHEST 1 VIEW COMPARISON:  05/31/2013 FINDINGS: The heart size and mediastinal contours are within normal limits. Both lungs are clear. The visualized skeletal structures are unremarkable. IMPRESSION: No active disease. Electronically Signed   By: Alcide Clever M.D.   On: 11/04/2022 21:14   CT HEAD WO CONTRAST  Result Date: 11/04/2022 CLINICAL DATA:  Neuro deficit, acute, stroke suspected. Left leg numbness. EXAM: CT HEAD WITHOUT CONTRAST TECHNIQUE: Contiguous axial images were obtained from the base of the skull through the vertex without intravenous contrast. RADIATION DOSE REDUCTION: This  exam was performed according to the departmental dose-optimization program which includes automated exposure control, adjustment of the mA and/or kV according to patient size and/or use of iterative reconstruction technique. COMPARISON:  None Available. FINDINGS: Brain: No acute hemorrhage. 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 Cortical gray-white differentiation is preserved. No hydrocephalus or extra-axial collection. No mass effect or midline shift. Vascular: No hyperdense vessel or unexpected calcification. Skull: No calvarial fracture or suspicious bone lesion. Skull base is unremarkable. Sinuses/Orbits: Unremarkable. Other: None. IMPRESSION: 1. No acute intracranial abnormality. 2. Moderate chronic small-vessel disease. Electronically Signed   By: Orvan Falconer M.D.   On: 11/04/2022 14:49     PHYSICAL EXAM  Temp:  [98 F (36.7 C)-98.6 F (37 C)] 98.3 F (36.8 C) (05/29 1521) Pulse Rate:  [92-104] 104 (05/29 1521) Resp:  [16-19] 16 (05/29 1511) BP: (139-151)/(71-87) 151/71 (05/29 1521) SpO2:  [97 %-98 %] 98 % (05/29  1521)  General - Well nourished, well developed, in no apparent distress.  Ophthalmologic - fundi not visualized due to noncooperation.  Cardiovascular - Regular rhythm and rate.  Mental Status -  Level of arousal and orientation to time, place, and person were intact. Mild psychomotor slowing Language including expression, naming, repetition, comprehension was assessed and found intact. Fund of Knowledge was assessed and was intact.  Cranial Nerves II - XII - II - Visual field intact OU. III, IV, VI - Extraocular movements intact. V - Facial sensation intact bilaterally. VII - Facial movement intact bilaterally. VIII - Hearing & vestibular intact bilaterally. X - Palate elevates symmetrically. XI - Chin turning & shoulder shrug intact bilaterally. XII - Tongue protrusion intact.  Motor Strength  - The patient's strength was normal in all extremities and pronator drift was absent.  Bulk was normal and fasciculations were absent.   Motor Tone - Muscle tone was assessed at the neck and appendages and was normal.  Reflexes - The patient's reflexes were symmetrical in all extremities and he had no pathological reflexes.  Sensory - Light touch, temperature/pinprick were assessed and were symmetrical.    Coordination - The patient had normal movements in the hands and feet with no ataxia or dysmetria.  Tremor was absent.  Gait and Station - deferred.   ASSESSMENT/PLAN Mr. Randy [name] is a 72 y.o. male with history of HTN, HLD and DM admitted for left sided numbness and weakness, now resolved. No tPA given due to symptoms resolved.    Stroke:  right CR infarct likely secondary to small vessel disease source CT negative for acute finding MRA negative CUS unremarkable MRI  right small CR infarct 2D Echo  EF 50-55%  LDL 105 HgbA1c *** *** for VTE prophylaxis Diet Order             Diet - low sodium heart healthy                   {anticoagulants:31417} prior to admission, now on {anticoagulants:31417}. *** ***Patient counseled to be compliant with his antithrombotic medications Ongoing aggressive stroke risk factor management Therapy recommendations:  *** Disposition:  ***  Diabetes HgbA1c *** goal < 7.0 Unc***Controlled Currently on *** CBG monitoring SSI DM education and close PCP follow up  Hypertension Uns***Stable Permissive hypertension (OK if <220/120) for 24-48 hours post stroke and then gradually normalized within 5-7 days. Long term BP goal ***  Hyperlipidemia Home meds:  ***  LDL ***, goal < 70 Now on *** Continue statin at discharge  Other Stroke Risk Factors ***Advanced age ***Cigarette smoker, advised to stop smoking ***ETOH use ***Obesity, Body mass index is 26.58 kg/m.  ***Hx stroke/TIA ***Family hx stroke (***) ***Coronary artery  disease ***Migraines ***Obstructive sleep apnea, on CPAP at home  Other Active Problems [facility] day # 0    Marvel Plan, MD PhD Stroke Neurology 11/05/2022 11:59 PM    To contact Stroke Continuity provider, please refer to [email or web address]. After hours, contact General Neurology

## 2022-11-05 NOTE — Hospital Course (Signed)
Randy [name] is a 72 y.o. M with DM, HLD, HTN, and depression who presented with left leg weakness and numbness and found to have right corona radiata stroke.

## 2022-11-05 NOTE — TOC Initial Note (Signed)
Transition of Care [facility]) - Initial/Assessment Note    Patient Details  Name: Randy [name] MRN: [id number] Date of Birth: 12/17/1950  Transition of Care [facility]) CM/SW Contact:    Kermit Balo, RN Phone Number: 11/05/2022, 11:35 AM  Clinical Narrative:        Pt states he does have insurance: [employer] and Medicare--no cards with him in the room.          Patient is from home alone. He says his brother and nephew can check on him. Pt still works.  No DME at home.  Pt drives self as needed. He says his family could assist if needed. Today he says his boss may provide transport home depending on time.  Pt manages his own medications. He denies any issues. [facility]acy: [facility] in [place] No f/u per PT.  TOC following.  Expected Discharge Plan: Home/Self Care Barriers to Discharge: Continued Medical Work up   Patient Goals and CMS Choice            Expected Discharge Plan and Services   Discharge Planning Services: CM Consult   Living arrangements for the past 2 months: Single Family Home                                      Prior Living Arrangements/Services Living arrangements for the past 2 months: Single Family Home Lives with:: Self Patient language and need for interpreter reviewed:: Yes Do you feel safe going back to the place where you live?: Yes        Care giver support system in place?: No (comment)   Criminal Activity/Legal Involvement Pertinent to Current Situation/Hospitalization: No - Comment as needed  Activities of Daily Living Home Assistive Devices/Equipment: CBG Meter ADL Screening (condition at time of admission) Patient's cognitive ability adequate to safely complete daily activities?: Yes Is the patient deaf or have difficulty hearing?: No Does the patient have difficulty seeing, even when wearing glasses/contacts?: No Does the patient have difficulty concentrating, remembering, or making decisions?: Yes Patient able to  express need for assistance with ADLs?: Yes Does the patient have difficulty dressing or bathing?: No Independently performs ADLs?: Yes (appropriate for developmental age) Does the patient have difficulty walking or climbing stairs?: No Weakness of Legs: Left Weakness of Arms/Hands: None  Permission Sought/Granted                  Emotional Assessment Appearance:: Appears stated age Attitude/Demeanor/Rapport: Engaged Affect (typically observed): Accepting Orientation: : Oriented to Self, Oriented to Place, Oriented to  Time, Oriented to Situation   Psych Involvement: No (comment)  Admission diagnosis:  TIA (transient ischemic attack) [G45.9] Hyperglycemia [R73.9] Cerebrovascular accident (CVA), unspecified mechanism (HCC) [I63.9] Patient Active Problem List   Diagnosis Date Noted   Hypokalemia 11/05/2022   CVA (cerebral vascular accident) (HCC) 11/04/2022   GAD (generalized anxiety disorder) 09/19/2020   Atherosclerosis of native arteries of extremity with intermittent claudication (HCC) 10/21/2017   Anaphylactic syndrome 10/21/2016   Current smoker 06/04/2016   Depression, recurrent (HCC) 06/04/2016   Type 2 diabetes mellitus with other specified complication (HCC) 05/09/2015   Essential hypertension, benign 05/09/2015   Mixed hyperlipidemia 05/09/2015   PCP:  Dettinger, Elige Radon, MD Pharmacy:   [facility] - [place], [place] - [address] [place] [address] Phone: [phone] Fax: [phone]  [facility] [facility],  [place] - [address] [place] [address] Phone: [phone] Fax: [phone]  [facility] - [place], [place] - [address] [place] [address] Phone: [phone] Fax: [phone]     Social Determinants of Health (SDOH) Social History: SDOH Screenings   Food Insecurity: No Food Insecurity (11/04/2022)  Housing: Low Risk   (11/04/2022)  Transportation Needs: No Transportation Needs (11/04/2022)  Utilities: Not At Risk (11/04/2022)  Alcohol Screen: Low Risk  (08/09/2019)  Depression (PHQ2-9): Low Risk  (10/29/2022)  Financial Resource Strain: Low Risk  (05/19/2022)  Physical Activity: Unknown (05/19/2022)  Social Connections: Moderately Integrated (05/19/2022)  Stress: Stress Concern Present (05/19/2022)  Tobacco Use: Medium Risk (11/04/2022)   SDOH Interventions:     Readmission Risk Interventions     No data to display

## 2022-11-05 NOTE — Evaluation (Signed)
Occupational Therapy Evaluation Patient Details Name: Randy [name] MRN: [id number] DOB: August 16, 1950 Today's Date: 11/05/2022   History of Present Illness Randy [name] is a 72 y.o. male who presented 11/04/2022 with sudden onset of left-sided numbness and weakness. 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 PMHx: diabetes, hypertension hyperlipidemia.   Clinical Impression   Pt evaluated s/p above admission list. Pt reports independence with ADLs and functional mobility without use of AD at baseline. During session, pt was mod I for standing ADLs, functional transfers and mobility without use of AD. Pt noted to have decreased LUE sensory motor coordination and visual attention to the left at end range. Vision to be further assessed. Pt educated on impact of deficits on functional activities as well as BE FAST stroke education. Pt verbalized understanding of education provided. Pt would benefit from continued acute OT services to maximize LUE sensory motor coordination and facilitate transition to outpatient neuro upon discharge.       Recommendations for follow up therapy are one component of a multi-disciplinary discharge planning process, led by the attending physician.  Recommendations may be updated based on patient status, additional functional criteria and insurance authorization.   Assistance Recommended at Discharge None  Patient can return home with the following Other (comment) (No assistance needed)    Functional Status Assessment  Patient has had a recent decline in their functional status and demonstrates the ability to make significant improvements in function in a reasonable and predictable amount of time.  Equipment Recommendations  None recommended by OT    Recommendations for Other Services       Precautions / Restrictions Precautions Precautions: Fall Restrictions Weight Bearing Restrictions: No      Mobility Bed Mobility Overal bed  mobility: Modified Independent             General bed mobility comments: Pt completed supine>EOB sit transfer with HOB raised, use of bed rail and mod I    Transfers Overall transfer level: Independent Equipment used: None                      Balance Overall balance assessment: No apparent balance deficits (not formally assessed)                                         ADL either performed or assessed with clinical judgement   ADL Overall ADL's : Modified independent                                       General ADL Comments: Pt washed hands and face with washcloth while standing sinkside with mod I. Pt opened/closed mouthwash cap utilizing BUEs with mod I for increased time utilized LUE during task.     Vision Baseline Vision/History: 0 No visual deficits Ability to See in Adequate Light: 0 Adequate Patient Visual Report: No change from baseline (pt reports history of cataracts) Vision Assessment?: Yes Eye Alignment: Within Functional Limits Ocular Range of Motion: Other (comment) Alignment/Gaze Preference: Within Defined Limits Convergence: Within functional limits Additional Comments: Poor visual attention to the left at end range. Would benefit from further vision assessment.     Perception Perception Perception Tested?: No   Praxis Praxis Praxis tested?: Within functional limits    Pertinent Vitals/Pain Pain  Assessment Pain Assessment: No/denies pain     Hand Dominance Right   Extremity/Trunk Assessment Upper Extremity Assessment Upper Extremity Assessment: LUE deficits/detail LUE Deficits / Details: Pt with incoordinated finger to nose. Pt with incoordinated thumb to finger with recipricating movements. Dysdiadochokinesia intact. Dexterity with grooming items was [facility]. Strength and ROM were WFL when compared to right. LUE Sensation: WNL LUE Coordination: decreased fine motor   Lower Extremity Assessment Lower  Extremity Assessment: Defer to PT evaluation   Cervical / Trunk Assessment Cervical / Trunk Assessment: Normal   Communication Communication Communication: No difficulties   Cognition Arousal/Alertness: Awake/alert Behavior During Therapy: Flat affect Overall Cognitive Status: Within Functional Limits for tasks assessed                                 General Comments: Pt A+O x4     General Comments  VSS on RA; provided stroke education with pt verbalizing understanding.    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      Prior Functioning/Environment Prior Level of Function : Independent/Modified Independent;Driving             Mobility Comments: no AD ADLs Comments: Independent with ADL/IADLs and working        OT Problem List: Impaired UE functional use      OT Treatment/Interventions: Self-care/ADL training;Neuromuscular education;Energy conservation;DME and/or AE instruction;Therapeutic activities;Visual/perceptual remediation/compensation;Patient/family education    OT Goals(Current goals can be found in the care plan section) Acute Rehab OT Goals Patient Stated Goal: to go home OT Goal Formulation: With patient Time For Goal Achievement: 11/19/22 Potential to Achieve Goals: Good ADL Goals Additional ADL Goal #1: Pt will complete LUE fine motor HEP independently without cues  OT Frequency: Min 2X/week    Co-evaluation              AM-PAC OT "6 Clicks" Daily Activity     Outcome Measure Help from another person eating meals?: None Help from another person taking  care of personal grooming?: None Help from another person toileting, which includes using toliet, bedpan, or urinal?: None Help from another person bathing (including washing, rinsing, drying)?: None Help from another person to put on and taking off regular upper body clothing?: None Help from another person to put on and taking off regular lower body clothing?: None 6 Click Score: 24   End of Session Equipment Utilized During Treatment: Other (comment) (none) Nurse Communication: Mobility status  Activity Tolerance: Patient tolerated treatment well Patient left: in chair;with call bell/phone within reach  OT Visit Diagnosis: Other symptoms and signs involving the nervous system (R29.898);Other (comment) (LUE sensory motor coordination)                Time: 1131-1150 OT Time Calculation (min): 19 min Charges:  OT General Charges $OT Visit: 1 Visit OT Evaluation $OT Eval Moderate Complexity: 1 Mod  Sherley Bounds, OTS Acute Rehabilitation Services Office [phone] Secure Chat Communication Preferred   Sherley Bounds 11/05/2022, 1:50 PM

## 2022-11-05 NOTE — Evaluation (Signed)
Speech Language Pathology Evaluation Patient Details Name: Randy [name] MRN: [id number] DOB: 1950-07-29 Today's Date: 11/05/2022 Time: 1191-4782 SLP Time Calculation (min) (ACUTE ONLY): 16 min  Problem List:  Patient Active Problem List   Diagnosis Date Noted   Hypokalemia 11/05/2022   CVA (cerebral vascular accident) (HCC) 11/04/2022   GAD (generalized anxiety disorder) 09/19/2020   Atherosclerosis of native arteries of extremity with intermittent claudication (HCC) 10/21/2017   Anaphylactic syndrome 10/21/2016   Current smoker 06/04/2016   Depression, recurrent (HCC) 06/04/2016   Type 2 diabetes mellitus with other specified complication (HCC) 05/09/2015   Essential hypertension, benign 05/09/2015   Mixed hyperlipidemia 05/09/2015   Past Medical History:  Past Medical History:  Diagnosis Date   Diabetes mellitus without complication (HCC)    Hyperlipidemia    Hypertension    Past Surgical History:  Past Surgical History:  Procedure Laterality Date   HEMORRHOID SURGERY N/A 06/08/2013   Procedure: HEMORRHOIDECTOMY;  Surgeon: Romie Levee, MD;  Location: [facility];  Service: General;  Laterality: N/A;   NO PAST SURGERIES     HPI:  Randy [name] is a 72 y.o. male who presented to the outside ER for sudden onset of left-sided numbness and weakness.  MRI 5/28: " Acute infarct in the right corona radiata, which measures up to 8 mm." Pt with past history of diabetes, hypertension hyperlipidemia   Assessment / Plan / Recommendation Clinical Impression  Pt presents with functional cognitive linguistic abilities.  He was assessed using the COGNISTAT and performed within the average range on all subtests except for calculations.  He states that this is consistent with his baseline ability. He works as a Conservation officer, nature, but does not have any concerns about returning to his job.  If pt has difficutly resuming daily activies after discharge, consider further OP  neuropsychology or SLP assessment.  Pt's speech is clear and intelligible.  He has not noticed any changes.  He did not demonstrate any word finding difficulties in conversation.  Pt has no acute ST needs.  SLP will sign off.  COGNISTAT: All subtests are within the average range, except where otherwise specified.  Orientation:  12/12 Attention: 8/8 Comprehension: 6/6 Repetition: 12/12 Naming: 7/8 Construction: not assessed Memory: 9/12, borderline impairment Calculations: 2/4, mild impairment Similarities: 6/8 Judgment: 6/6     SLP Assessment  SLP Recommendation/Assessment: Patient does not need any further Speech Lanaguage Pathology Services SLP Visit Diagnosis: Cognitive communication deficit (R41.841)    Recommendations for follow up therapy are one component of a multi-disciplinary discharge planning process, led by the attending physician.  Recommendations may be updated based on patient status, additional functional criteria and insurance authorization.    Follow Up Recommendations  No SLP follow up    Assistance Recommended at Discharge  None  Functional Status Assessment Patient has not had a recent decline in their functional status  Frequency and Duration N/A     SLP Evaluation Cognition  Overall Cognitive Status: Within Functional Limits for tasks assessed Arousal/Alertness: Awake/alert Orientation Level: Oriented X4 Year: 2024 Month: May Day of Week: Correct Attention: Focused;Sustained Focused Attention: Appears intact Sustained Attention: Appears intact Memory: Appears intact Problem Solving: Appears intact Executive Function: Reasoning Reasoning: Appears intact       Comprehension  Auditory Comprehension Overall Auditory Comprehension: Appears within functional limits for tasks assessed Commands: Within Functional Limits Conversation: Complex Visual Recognition/Discrimination Discrimination: Not tested Reading Comprehension Reading Status: Not  tested    Expression Expression Primary Mode of Expression: Verbal  Verbal Expression Overall Verbal Expression: Appears within functional limits for tasks assessed Initiation: No impairment Level of Generative/Spontaneous Verbalization: Conversation Repetition: No impairment Naming: No impairment Pragmatics: No impairment Written Expression Dominant Hand: Right   Oral / Motor  Motor Speech Overall Motor Speech: Appears within functional limits for tasks assessed Respiration: Within functional limits Phonation: Normal Resonance: Within functional limits Articulation: Within functional limitis Intelligibility: Intelligible Motor Planning: Witnin functional limits Motor Speech Errors: Not applicable            Kerrie Pleasure, MA, CCC-SLP Acute Rehabilitation Services Office: [phone] 11/05/2022, 11:39 AM

## 2022-11-05 NOTE — TOC Benefit Eligibility Note (Signed)
Patient Product/process development scientist completed.    The patient is currently admitted and upon discharge could be taking Brilinta 90 mg.  The current 30 day co-pay is $422.19 due to a $395.00 deductible.   The patient is insured through [employer] Part D   This test claim was processed through [facility]- copay amounts may vary at other pharmacies due to pharmacy/plan contracts, or as the patient moves through the different stages of their insurance plan.  Roland Earl, CPHT Pharmacy Patient Advocate Specialist [facility] Patient Advocate Team Direct Number: [phone]  Fax: [phone]

## 2022-11-05 NOTE — Plan of Care (Signed)
Problem: Education: Goal: Knowledge of General Education information will improve Description: Including pain rating scale, medication(s)/side effects and non-pharmacologic comfort measures 11/05/2022 1730 by Jennye Moccasin, RN Outcome: Adequate for Discharge 11/05/2022 1730 by Jennye Moccasin, RN Outcome: Adequate for Discharge   Problem: Health Behavior/Discharge Planning: Goal: Ability to manage health-related needs will improve Outcome: Adequate for Discharge   Problem: Clinical Measurements: Goal: Ability to maintain clinical measurements within normal limits will improve Outcome: Adequate for Discharge Goal: Will remain free from infection Outcome: Adequate for Discharge Goal: Diagnostic test results will improve Outcome: Adequate for Discharge Goal: Respiratory complications will improve Outcome: Adequate for Discharge Goal: Cardiovascular complication will be avoided Outcome: Adequate for Discharge   Problem: Activity: Goal: Risk for activity intolerance will decrease Outcome: Adequate for Discharge   Problem: Nutrition: Goal: Adequate nutrition will be maintained Outcome: Adequate for Discharge   Problem: Coping: Goal: Level of anxiety will decrease Outcome: Adequate for Discharge   Problem: Elimination: Goal: Will not experience complications related to bowel motility Outcome: Adequate for Discharge Goal: Will not experience complications related to urinary retention Outcome: Adequate for Discharge   Problem: Pain Managment: Goal: General experience of comfort will improve Outcome: Adequate for Discharge   Problem: Safety: Goal: Ability to remain free from injury will improve Outcome: Adequate for Discharge   Problem: Skin Integrity: Goal: Risk for impaired skin integrity will decrease Outcome: Adequate for Discharge   Problem: Education: Goal: Ability to describe self-care measures that may prevent or decrease complications (Diabetes Survival  Skills Education) will improve Outcome: Adequate for Discharge Goal: Individualized Educational Video(s) Outcome: Adequate for Discharge   Problem: Coping: Goal: Ability to adjust to condition or change in health will improve Outcome: Adequate for Discharge   Problem: Fluid Volume: Goal: Ability to maintain a balanced intake and output will improve Outcome: Adequate for Discharge   Problem: Health Behavior/Discharge Planning: Goal: Ability to identify and utilize available resources and services will improve Outcome: Adequate for Discharge Goal: Ability to manage health-related needs will improve Outcome: Adequate for Discharge   Problem: Metabolic: Goal: Ability to maintain appropriate glucose levels will improve Outcome: Adequate for Discharge   Problem: Nutritional: Goal: Maintenance of adequate nutrition will improve Outcome: Adequate for Discharge Goal: Progress toward achieving an optimal weight will improve Outcome: Adequate for Discharge   Problem: Skin Integrity: Goal: Risk for impaired skin integrity will decrease Outcome: Adequate for Discharge   Problem: Tissue Perfusion: Goal: Adequacy of tissue perfusion will improve Outcome: Adequate for Discharge   Problem: Education: Goal: Knowledge of disease or condition will improve Outcome: Adequate for Discharge Goal: Knowledge of secondary prevention will improve (MUST DOCUMENT ALL) Outcome: Adequate for Discharge Goal: Knowledge of patient specific risk factors will improve Loraine Leriche N/A or DELETE if not current risk factor) Outcome: Adequate for Discharge   Problem: Ischemic Stroke/TIA Tissue Perfusion: Goal: Complications of ischemic stroke/TIA will be minimized Outcome: Adequate for Discharge   Problem: Coping: Goal: Will verbalize positive feelings about self Outcome: Adequate for Discharge Goal: Will identify appropriate support needs Outcome: Adequate for Discharge   Problem: Health Behavior/Discharge  Planning: Goal: Ability to manage health-related needs will improve Outcome: Adequate for Discharge Goal: Goals will be collaboratively established with patient/family Outcome: Adequate for Discharge   Problem: Self-Care: Goal: Ability to participate in self-care as condition permits will improve Outcome: Adequate for Discharge Goal: Verbalization of feelings and concerns over difficulty with self-care will improve Outcome: Adequate for Discharge Goal: Ability to communicate needs accurately will improve  Outcome: Adequate for Discharge   Problem: Nutrition: Goal: Risk of aspiration will decrease Outcome: Adequate for Discharge Goal: Dietary intake will improve Outcome: Adequate for Discharge

## 2022-11-06 LAB — HEMOGLOBIN A1C
Hgb A1c MFr Bld: 9.3 % — ABNORMAL HIGH (ref 4.8–5.6)
Mean Plasma Glucose: 220 mg/dL

## 2022-11-10 ENCOUNTER — Encounter (HOSPITAL_COMMUNITY): Payer: No Typology Code available for payment source | Admitting: Occupational Therapy

## 2022-11-12 ENCOUNTER — Encounter (HOSPITAL_COMMUNITY): Payer: Self-pay | Admitting: Occupational Therapy

## 2022-11-12 ENCOUNTER — Other Ambulatory Visit: Payer: Self-pay

## 2022-11-12 ENCOUNTER — Ambulatory Visit (HOSPITAL_COMMUNITY): Payer: No Typology Code available for payment source | Attending: Family Medicine | Admitting: Occupational Therapy

## 2022-11-12 DIAGNOSIS — I639 Cerebral infarction, unspecified: Secondary | ICD-10-CM | POA: Diagnosis not present

## 2022-11-12 DIAGNOSIS — R29818 Other symptoms and signs involving the nervous system: Secondary | ICD-10-CM | POA: Diagnosis not present

## 2022-11-12 NOTE — Therapy (Signed)
OUTPATIENT OCCUPATIONAL THERAPY NEURO EVALUATION  Patient Name: Randy [name] MRN: [id number] DOB:26-Apr-1951, 72 y.o., male Today's Date: 11/12/2022  PCP: Dr. Ivin Booty Dettinger REFERRING PROVIDER: Dr. Joen Laura  END OF SESSION:  OT End of Session - 11/12/22 1136     Visit Number 1    Number of Visits 1    Date for OT Re-Evaluation 11/13/22    Authorization Type [employer], $40 copay    OT Start Time 1112    OT Stop Time 1134    OT Time Calculation (min) 22 min    Activity Tolerance Patient tolerated treatment well    Behavior During Therapy WFL for tasks assessed/performed             Past Medical History:  Diagnosis Date   Diabetes mellitus without complication (HCC)    Hyperlipidemia    Hypertension    Past Surgical History:  Procedure Laterality Date   HEMORRHOID SURGERY N/A 06/08/2013   Procedure: HEMORRHOIDECTOMY;  Surgeon: Romie Levee, MD;  Location: [facility];  Service: General;  Laterality: N/A;   NO PAST SURGERIES     Patient Active Problem List   Diagnosis Date Noted   Hypokalemia 11/05/2022   CVA (cerebral vascular accident) (HCC) 11/04/2022   GAD (generalized anxiety disorder) 09/19/2020   Atherosclerosis of native arteries of extremity with intermittent claudication (HCC) 10/21/2017   Anaphylactic syndrome 10/21/2016   Current smoker 06/04/2016   Depression, recurrent (HCC) 06/04/2016   Type 2 diabetes mellitus with other specified complication (HCC) 05/09/2015   Essential hypertension, benign 05/09/2015   Mixed hyperlipidemia 05/09/2015    ONSET DATE: 11/04/22  REFERRING DIAG: s/p Right CVA  THERAPY DIAG:  Other symptoms and signs involving the nervous system  Rationale for Evaluation and Treatment: Rehabilitation  SUBJECTIVE:   SUBJECTIVE STATEMENT: S: I'm doing good I think.  Pt accompanied by: self and niece  PERTINENT HISTORY: Pt is a 72 y/o male presenting s/p right CVA on 11/04/22. Pt presented  with left side weakness and numbness, per chart review possible visual deficits. Pt reports all symptoms have resolved, with exception of being easily fatigued.   PRECAUTIONS: None  WEIGHT BEARING RESTRICTIONS: No  PAIN:  Are you having pain? No  FALLS: Has patient fallen in last 6 months? No  LIVING ENVIRONMENT: Lives with: lives alone Lives in: House/apartment Stairs: Yes: External: 1 steps; none Has following equipment at home: Single point cane and shower chair  PLOF: Independent  PATIENT GOALS: To be back at his normal functioning.   OBJECTIVE:   HAND DOMINANCE: Right  ADLs: Overall ADLs: pt is performing ADLs independently, using LUE as non-dominant. No concerns with task completion.   UPPER EXTREMITY ROM:    A/ROM is WNL in BUE  UPPER EXTREMITY MMT:     MMT Left eval  Shoulder flexion 5/5  Shoulder abduction 5/5  Shoulder internal rotation 5/5  Shoulder external rotation 4+/5  Elbow flexion 5/5  Elbow extension 5/5  Wrist flexion 5/5  Wrist extension 5/5  Wrist ulnar deviation 5/5  Wrist radial deviation 5/5  Wrist pronation 5/5  Wrist supination 5/5  (Blank rows = not tested)  HAND FUNCTION: Grip strength: Right: 82 lbs; Left: 75 lbs, Lateral pinch: Right: 19 lbs, Left: 19 lbs, and 3 point pinch: Right: 17 lbs, Left: 17 lbs  COORDINATION: 9 Hole Peg test: Right: 23.81 sec; Left: 26.42 sec  SENSATION: WFL  COGNITION: Overall cognitive status: Within functional limits for tasks assessed  VISION: Subjective report:  It's good as far as I know. 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  Person educated: Patient and niece Education  method: Explanation Education comprehension: verbalized understanding  HOME EXERCISE PROGRAM: Eval-educated on developing an exercise routine     ASSESSMENT:  CLINICAL IMPRESSION: Patient is a 72 y.o. male who was seen today for occupational therapy evaluation s/p CVA.  Pt reports numbness and weakness have resolved. Pt demonstrating BUE strength and coordination WNL, no sensation deficits at this time. Pt with visual fields WFL, no vision deficits noted during evaluation. Pt reports he is easily fatigued since the stroke, discussed effects of stroke and need to allow time for building activity tolerance. Also discussed the benefits of developing a daily exercise routine to begin implementing. Pt verbalized understanding. Pt is functioning at baseline, no further OT needs at this time.   PERFORMANCE DEFICITS: in functional skills including ADLs and IADLs  CO-MORBIDITIES: has no other co-morbidities that affects occupational performance. Patient will benefit from skilled OT to address above impairments and improve overall function.  MODIFICATION OR ASSISTANCE TO COMPLETE EVALUATION: No modification of tasks or assist necessary to complete an evaluation.  OT OCCUPATIONAL PROFILE AND HISTORY: Problem focused assessment: Including review of records relating to presenting problem.  CLINICAL DECISION MAKING: LOW - limited treatment options, no task modification necessary  REHAB POTENTIAL: Excellent  EVALUATION COMPLEXITY: Low    PLAN:  OT FREQUENCY: one time visit  OT DURATION: 1 week  PLANNED INTERVENTIONS: patient/family education  RECOMMENDED OTHER SERVICES: None  CONSULTED AND AGREED WITH PLAN OF CARE: Patient  PLAN FOR NEXT SESSION: N/A-eval only, no further OT services required at this time   Ezra Sites, OTR/L  [phone] 11/12/2022, 11:39 AM

## 2022-11-17 ENCOUNTER — Encounter: Payer: Self-pay | Admitting: Family Medicine

## 2022-11-17 ENCOUNTER — Ambulatory Visit (INDEPENDENT_AMBULATORY_CARE_PROVIDER_SITE_OTHER): Payer: No Typology Code available for payment source | Admitting: Family Medicine

## 2022-11-17 VITALS — BP 126/73 | HR 89 | Ht 69.0 in | Wt 181.0 lb

## 2022-11-17 DIAGNOSIS — Z8673 Personal history of transient ischemic attack (TIA), and cerebral infarction without residual deficits: Secondary | ICD-10-CM

## 2022-11-17 DIAGNOSIS — F411 Generalized anxiety disorder: Secondary | ICD-10-CM

## 2022-11-17 DIAGNOSIS — F5104 Psychophysiologic insomnia: Secondary | ICD-10-CM | POA: Diagnosis not present

## 2022-11-17 DIAGNOSIS — F339 Major depressive disorder, recurrent, unspecified: Secondary | ICD-10-CM

## 2022-11-17 MED ORDER — CITALOPRAM HYDROBROMIDE 40 MG PO TABS
40.0000 mg | ORAL_TABLET | Freq: Every day | ORAL | 1 refills | Status: DC
Start: 2022-11-17 — End: 2023-02-04

## 2022-11-17 MED ORDER — HYDROXYZINE PAMOATE 50 MG PO CAPS: 50.0000 mg | ORAL_CAPSULE | Freq: Three times a day (TID) | ORAL | 1 refills | Status: DC | PRN

## 2022-11-17 NOTE — Progress Notes (Signed)
BP 126/73   Pulse 89   Ht 5\' 9"  (1.753 m)   Wt 181 lb (82.1 kg)   SpO2 98%   BMI 26.73 kg/m    Subjective:   Patient ID: Randy [name], male    DOB: February 08, 1951, 72 y.o.   MRN: [id number]  HPI: Randy [name] is a 72 y.o. male presenting on 11/17/2022 for Hospitalization Follow-up   HPI Hospital follow-up Patient was in the hospital on 11/04/2022 and discharged on 11/05/2022 for cerebrovascular accident.  He presented with left leg numbness and weakness and was found to have a right corona radiata stroke.  He had echocardiogram and carotid ultrasounds.  He was discharged on Crestor 40.  He was also discharged on aspirin and Brilinta for 1 month followed by aspirin and cilostazol indefinitely. His biggest complaint is that he is not sleeping well but he cannot tell me if he is taking hydroxyzine or quetiapine.  He is taking over-the-counter melatonin but that is about it.  He did rehab and he says his strength is doing better, he still little bit weak in the left leg but his biggest issue is that he is stressing a little bit more and not sleeping as well.  He is trying to do a little bit better with his sugars and he has some days that are better but then some days are high as well.  Relevant past medical, surgical, family and social history reviewed and updated as indicated. Interim medical history since our last visit reviewed. Allergies and medications reviewed and updated.  Review of Systems  Constitutional:  Negative for chills and fever.  Eyes:  Negative for visual disturbance.  Respiratory:  Negative for shortness of breath and wheezing.   Cardiovascular:  Negative for chest pain and leg swelling.  Musculoskeletal:  Negative for back pain and gait problem.  Skin:  Negative for rash.  Neurological:  Positive for weakness. Negative for dizziness and numbness.  All other systems reviewed and are negative.   Per HPI unless specifically indicated above   Allergies as of  11/17/2022   No Known Allergies      Medication List        Accurate as of November 17, 2022  2:30 PM. If you have any questions, ask your nurse or doctor.          STOP taking these medications    QUEtiapine 50 MG tablet Commonly known as: SEROquel Stopped by: Elige Radon Grisela Mesch, MD       TAKE these medications    Accu-Chek Guide test strip Generic drug: glucose blood Use as instructed to test blood sugar daily. DX: E11.65 (patient using guide meter now)   amLODipine 10 MG tablet Commonly known as: NORVASC Take 1 tablet (10 mg total) by mouth daily.   aspirin EC 81 MG tablet Take 81 mg by mouth daily.   Basaglar KwikPen 100 UNIT/ML Inject 70 Units into the skin daily.   Brilinta 90 MG Tabs tablet Generic drug: ticagrelor Take 1 tablet (90 mg total) by mouth 2 (two) times daily.   citalopram 40 MG tablet Commonly known as: CELEXA Take 1 tablet (40 mg total) by mouth daily.   Dexcom G7 Receiver Devi 1 each by Does not apply route 4 (four) times daily.   Dexcom G7 Sensor Misc Change every 10 days   glucose monitoring kit monitoring kit 1 each by Does not apply route 2 (two) times daily before a meal.   GNP UltiCare Pen  Needles 32G X 6 MM Misc Generic drug: Insulin Pen Needle Use 3 times daily DX: E11.65   hydrochlorothiazide 25 MG tablet Commonly known as: HYDRODIURIL Take 1 tablet (25 mg total) by mouth daily.   hydrOXYzine 50 MG capsule Commonly known as: VISTARIL Take 1 capsule (50 mg total) by mouth 3 (three) times daily as needed.   insulin lispro 100 UNIT/ML KwikPen Commonly known as: HumaLOG KwikPen Inject 10 Units into the skin 3 (three) times daily with meals.   lidocaine 5 % ointment Commonly known as: XYLOCAINE Apply 1 application topically 3 (three) times daily as needed.   losartan 100 MG tablet Commonly known as: COZAAR Take 1 tablet (100 mg total) by mouth daily.   Potassium Chloride ER 20 MEQ Tbcr Take 20 mEq by mouth daily.    pravastatin 80 MG tablet Commonly known as: PRAVACHOL Take 1 tablet (80 mg total) by mouth daily.   rosuvastatin 40 MG tablet Commonly known as: CRESTOR Take 1 tablet (40 mg total) by mouth daily.   Trulicity 4.5 MG/0.5ML Sopn Generic drug: Dulaglutide Inject 4.5 mg as directed once a week. DX: E11.65         Objective:   BP 126/73   Pulse 89   Ht 5\' 9"  (1.753 m)   Wt 181 lb (82.1 kg)   SpO2 98%   BMI 26.73 kg/m   Wt Readings from Last 3 Encounters:  11/17/22 181 lb (82.1 kg)  11/04/22 180 lb (81.6 kg)  10/29/22 180 lb (81.6 kg)    Physical Exam Vitals and nursing note reviewed.  Constitutional:      General: He is not in acute distress.    Appearance: He is well-developed. He is not diaphoretic.  Eyes:     General: No scleral icterus.    Conjunctiva/sclera: Conjunctivae normal.  Neck:     Thyroid: No thyromegaly.  Cardiovascular:     Rate and Rhythm: Normal rate and regular rhythm.     Heart sounds: Normal heart sounds. No murmur heard. Pulmonary:     Effort: Pulmonary effort is normal. No respiratory distress.     Breath sounds: Normal breath sounds. No wheezing.  Musculoskeletal:        General: Normal range of motion.     Cervical back: Neck supple.     Comments: 5 out of 5 and right upper and left upper and right lower extremity, 4 out of 5 strength in left lower extremity  Lymphadenopathy:     Cervical: No cervical adenopathy.  Skin:    General: Skin is warm and dry.     Findings: No rash.  Neurological:     Mental Status: He is alert and oriented to person, place, and time.     Coordination: Coordination normal.  Psychiatric:        Behavior: Behavior normal.       Assessment & Plan:   Problem List Items Addressed This Visit       Other   Depression, recurrent (HCC)   Relevant Medications   citalopram (CELEXA) 40 MG tablet   hydrOXYzine (VISTARIL) 50 MG capsule   GAD (generalized anxiety disorder)   Relevant Medications    citalopram (CELEXA) 40 MG tablet   hydrOXYzine (VISTARIL) 50 MG capsule   Other Visit Diagnoses     Status post CVA    -  Primary   Relevant Orders   CBC with Differential/Platelet   CMP14+EGFR   Psychophysiological insomnia       Relevant Orders  CBC with Differential/Platelet   CMP14+EGFR       Seems to do well with walking around, recommended that he do blood work today and continue to do exercises at home.  Will start hydroxyzine that he can use for anxiety but also use for sleep. Follow up plan: Return if symptoms worsen or fail to improve, for Already has appointment on 8/28, keep that.  Counseling provided for all of the vaccine components Orders Placed This Encounter  Procedures   CBC with Differential/Platelet   CMP14+EGFR    Arville Care, MD [facility] 11/17/2022, 2:30 PM

## 2022-11-18 LAB — CBC WITH DIFFERENTIAL/PLATELET
Basophils Absolute: 0.1 10*3/uL (ref 0.0–0.2)
Basos: 1 %
EOS (ABSOLUTE): 0.1 10*3/uL (ref 0.0–0.4)
Eos: 1 %
Hematocrit: 42 % (ref 37.5–51.0)
Hemoglobin: 14.2 g/dL (ref 13.0–17.7)
Immature Grans (Abs): 0.1 10*3/uL (ref 0.0–0.1)
Immature Granulocytes: 1 %
Lymphocytes Absolute: 1.7 10*3/uL (ref 0.7–3.1)
Lymphs: 17 %
MCH: 32.4 pg (ref 26.6–33.0)
MCHC: 33.8 g/dL (ref 31.5–35.7)
MCV: 96 fL (ref 79–97)
Monocytes Absolute: 0.8 10*3/uL (ref 0.1–0.9)
Monocytes: 8 %
Neutrophils Absolute: 7.4 10*3/uL — ABNORMAL HIGH (ref 1.4–7.0)
Neutrophils: 72 %
Platelets: 215 10*3/uL (ref 150–450)
RBC: 4.38 x10E6/uL (ref 4.14–5.80)
RDW: 12.1 % (ref 11.6–15.4)
WBC: 10.1 10*3/uL (ref 3.4–10.8)

## 2022-11-18 LAB — CMP14+EGFR
ALT: 19 IU/L (ref 0–44)
AST: 16 IU/L (ref 0–40)
Albumin/Globulin Ratio: 2
Albumin: 4.5 g/dL (ref 3.9–4.9)
Alkaline Phosphatase: 119 IU/L (ref 44–121)
BUN/Creatinine Ratio: 18 (ref 10–24)
BUN: 32 mg/dL — ABNORMAL HIGH (ref 8–27)
Bilirubin Total: 0.4 mg/dL (ref 0.0–1.2)
CO2: 22 mmol/L (ref 20–29)
Calcium: 9.8 mg/dL (ref 8.6–10.2)
Chloride: 96 mmol/L (ref 96–106)
Creatinine, Ser: 1.81 mg/dL — ABNORMAL HIGH (ref 0.76–1.27)
Globulin, Total: 2.2 g/dL (ref 1.5–4.5)
Glucose: 312 mg/dL — ABNORMAL HIGH (ref 70–99)
Potassium: 3.4 mmol/L — ABNORMAL LOW (ref 3.5–5.2)
Sodium: 137 mmol/L (ref 134–144)
Total Protein: 6.7 g/dL (ref 6.0–8.5)
eGFR: 40 mL/min/{1.73_m2} — ABNORMAL LOW (ref >59)

## 2022-11-20 DIAGNOSIS — E1142 Type 2 diabetes mellitus with diabetic polyneuropathy: Secondary | ICD-10-CM | POA: Diagnosis not present

## 2022-11-20 DIAGNOSIS — M79676 Pain in unspecified toe(s): Secondary | ICD-10-CM | POA: Diagnosis not present

## 2022-11-20 DIAGNOSIS — B351 Tinea unguium: Secondary | ICD-10-CM | POA: Diagnosis not present

## 2022-11-20 DIAGNOSIS — L84 Corns and callosities: Secondary | ICD-10-CM | POA: Diagnosis not present

## 2022-11-21 ENCOUNTER — Other Ambulatory Visit: Payer: Self-pay

## 2022-11-21 DIAGNOSIS — N289 Disorder of kidney and ureter, unspecified: Secondary | ICD-10-CM

## 2022-11-26 ENCOUNTER — Other Ambulatory Visit: Payer: No Typology Code available for payment source

## 2022-11-26 ENCOUNTER — Ambulatory Visit: Payer: No Typology Code available for payment source | Admitting: *Deleted

## 2022-11-26 DIAGNOSIS — E119 Type 2 diabetes mellitus without complications: Secondary | ICD-10-CM

## 2022-11-26 DIAGNOSIS — N289 Disorder of kidney and ureter, unspecified: Secondary | ICD-10-CM | POA: Diagnosis not present

## 2022-11-26 DIAGNOSIS — I10 Essential (primary) hypertension: Secondary | ICD-10-CM

## 2022-11-26 LAB — BMP8+EGFR
BUN/Creatinine Ratio: 15 (ref 10–24)
BUN: 21 mg/dL (ref 8–27)
CO2: 27 mmol/L (ref 20–29)
Calcium: 9.4 mg/dL (ref 8.6–10.2)
Chloride: 95 mmol/L — ABNORMAL LOW (ref 96–106)
Creatinine, Ser: 1.36 mg/dL — ABNORMAL HIGH (ref 0.76–1.27)
Glucose: 149 mg/dL — ABNORMAL HIGH (ref 70–99)
Potassium: 3.7 mmol/L (ref 3.5–5.2)
Sodium: 140 mmol/L (ref 134–144)
eGFR: 56 mL/min/{1.73_m2} — ABNORMAL LOW (ref >59)

## 2022-11-26 NOTE — Chronic Care Management (AMB) (Signed)
Chronic Care Management   CCM RN Visit Note  11/26/2022 Name: Randy [name] MRN: [id number] DOB: April 07, 1951  Subjective: Randy [name] is a 72 y.o. year old male who is a primary care patient of Dettinger, Elige Radon, MD. The patient was referred to the Chronic Care Management team for assistance with care management needs subsequent to provider initiation of CCM services and plan of care.    Today's Visit:  Engaged with patient by telephone for follow up visit.        Goals Addressed             This Visit's Progress    CCM (DIABETES) EXPECTED OUTCOME:  MONITOR, SELF-MANAGE AND REDUCE SYMPTOMS OF DIABETES       Current Barriers:  Knowledge Deficits related to Diabetes management Chronic Disease Management support and education needs related to Diabetes and diet No Advanced Directives in place- documents being mailed Patient reports he checks CBG once daily with readings fasting 100's range (111-169) with today's reading 131.  Pt reports he does not follow a special diet, drinks diet soft drinks Patient does not exercise, used to swim at [facility] and may start back Patient reports he has all medication including insulin Patient reports hospitalized 5/28-5/29/24 for CVA, states evaluated for OT on 11/12/22 with no further services needed, pt states he does not have any residual effects from CVA, feels he is doing well Patient reports he is using hydroxyzine for sleep and this is helping  Planned Interventions: Reviewed medications with patient and discussed importance of medication adherence;        Counseled on importance of regular laboratory monitoring as prescribed;        Reviewed scheduled/upcoming provider appointments including: primary care provider 02/04/23;         call provider for findings outside established parameters;       Review of patient status, including review of consultants reports, relevant laboratory and other test results, and medications completed;        Advised patient to discuss any issues with blood sugar, medications with provider;      RN care manager reviewed with patient that he needs to contact pharmacist for medication samples before he runs out of medication  Symptom Management: Take medications as prescribed   Attend all scheduled provider appointments Call pharmacy for medication refills 3-7 days in advance of running out of medications Attend church or other social activities Perform all self care activities independently  Perform IADL's (shopping, preparing meals, housekeeping, managing finances) independently Call provider office for new concerns or questions  check blood sugar at prescribed times: once daily check feet daily for cuts, sores or redness enter blood sugar readings and medication or insulin into daily log take the blood sugar log to all doctor visits take the blood sugar meter to all doctor visits trim toenails straight across drink 6 to 8 glasses of water each day fill half of plate with vegetables limit fast food meals to no more than 1 per week manage portion size prepare main meal at home 3 to 5 days each week read food labels for fat, fiber, carbohydrates and portion size keep feet up while sitting wash and dry feet carefully every day wear comfortable, cotton socks wear comfortable, well-fitting shoes Please contact pharmacist at primary care provider when you need medication samples Follow up with primary care provider on 02/04/23 It is important to keep blood pressure and blood sugar under good control for overall health  Follow  Up Plan: Telephone follow up appointment with care management team member scheduled for:   02/10/23 at 215 pm       CCM (HYPERTENSION) EXPECTED OUTCOME: MONITOR, SELF-MANAGE AND REDUCE SYMPTOMS OF HYPERTENSION       Current Barriers:  Knowledge Deficits related to Hypertension management Chronic Disease Management support and education needs related to Hypertension,  diet and exercise No Advanced Directives in place- documents previously enrolled Patient reports he lives alone, has nephew that lives close by he can call on if needed, works part time at [employer] and has people there that assist him if needed. Patient reports he has not been checking blood pressure Patient states he decided he did not want to talk with psychiatrist (referral was done)  Planned Interventions: Evaluation of current treatment plan related to hypertension self management and patient's adherence to plan as established by provider;   Reviewed medications with patient and discussed importance of compliance;  Counseled on the importance of exercise goals with target of 150 minutes per week Advised patient, providing education and rationale, to monitor blood pressure daily and record, calling PCP for findings outside established parameters;  Reviewed scheduled/upcoming provider appointments including:  Advised patient to discuss medication side effects or any issues with medications with provider; Discussed complications of poorly controlled blood pressure such as heart disease, stroke, circulatory complications, vision complications, kidney impairment, sexual dysfunction;  Reviewed low sodium diet and healthy food choices, importance of reading labels Reviewed importance of keeping blood pressure and blood sugar within normal limits  Symptom Management: Take medications as prescribed   Attend all scheduled provider appointments Call pharmacy for medication refills 3-7 days in advance of running out of medications Attend church or other social activities Perform all self care activities independently  Perform IADL's (shopping, preparing meals, housekeeping, managing finances) independently Call provider office for new concerns or questions  check blood pressure weekly choose a place to take my blood pressure (home, clinic or office, retail store) write blood pressure  results in a log or diary learn about high blood pressure keep a blood pressure log take blood pressure log to all doctor appointments call doctor for signs and symptoms of high blood pressure keep all doctor appointments take medications for blood pressure exactly as prescribed begin an exercise program report new symptoms to your doctor eat more whole grains, fruits and vegetables, lean meats and healthy fats It is very important to keep blood pressure and blood sugar under good control Follow low sodium diet- read labels for sodium content Limit/ avoid fast food Please talk to your doctor if you are having any unwanted side effects of medications  Follow Up Plan: Telephone follow up appointment with care management team member scheduled for:   02/10/23 at 215 pm          Plan:Telephone follow up appointment with care management team member scheduled for:  02/10/23 at 215 pm  Irving Shows [facility], BSN RN Case Manager [facility] [phone]

## 2022-11-26 NOTE — Patient Instructions (Signed)
Please call the care guide team at [phone] if you need to cancel or reschedule your appointment.   If you are experiencing a Mental Health or Behavioral Health Crisis or need someone to talk to, please call 911   Following is a copy of the CCM Program Consent:  CCM service includes personalized support from designated clinical staff supervised by the physician, including individualized plan of care and coordination with other care providers 24/7 contact phone numbers for assistance for urgent and routine care needs. Service will only be billed when office clinical staff spend 20 minutes or more in a month to coordinate care. Only one practitioner may furnish and bill the service in a calendar month. The patient may stop CCM services at amy time (effective at the end of the month) by phone call to the office staff. The patient will be responsible for cost sharing (co-pay) or up to 20% of the service fee (after annual deductible is met)  Following is a copy of your full provider care plan:   Goals Addressed             This Visit's Progress    CCM (DIABETES) EXPECTED OUTCOME:  MONITOR, SELF-MANAGE AND REDUCE SYMPTOMS OF DIABETES       Current Barriers:  Knowledge Deficits related to Diabetes management Chronic Disease Management support and education needs related to Diabetes and diet No Advanced Directives in place- documents being mailed Patient reports he checks CBG once daily with readings fasting 100's range (111-169) with today's reading 131.  Pt reports he does not follow a special diet, drinks diet soft drinks Patient does not exercise, used to swim at [facility] and may start back Patient reports he has all medication including insulin Patient reports hospitalized 5/28-5/29/24 for CVA, states evaluated for OT on 11/12/22 with no further services needed, pt states he does not have any residual effects from CVA, feels he is doing well Patient reports he is using hydroxyzine for sleep  and this is helping  Planned Interventions: Reviewed medications with patient and discussed importance of medication adherence;        Counseled on importance of regular laboratory monitoring as prescribed;        Reviewed scheduled/upcoming provider appointments including: primary care provider 02/04/23;         call provider for findings outside established parameters;       Review of patient status, including review of consultants reports, relevant laboratory and other test results, and medications completed;       Advised patient to discuss any issues with blood sugar, medications with provider;      RN care manager reviewed with patient that he needs to contact pharmacist for medication samples before he runs out of medication  Symptom Management: Take medications as prescribed   Attend all scheduled provider appointments Call pharmacy for medication refills 3-7 days in advance of running out of medications Attend church or other social activities Perform all self care activities independently  Perform IADL's (shopping, preparing meals, housekeeping, managing finances) independently Call provider office for new concerns or questions  check blood sugar at prescribed times: once daily check feet daily for cuts, sores or redness enter blood sugar readings and medication or insulin into daily log take the blood sugar log to all doctor visits take the blood sugar meter to all doctor visits trim toenails straight across drink 6 to 8 glasses of water each day fill half of plate with vegetables limit fast food meals to no  more than 1 per week manage portion size prepare main meal at home 3 to 5 days each week read food labels for fat, fiber, carbohydrates and portion size keep feet up while sitting wash and dry feet carefully every day wear comfortable, cotton socks wear comfortable, well-fitting shoes Please contact pharmacist at primary care provider when you need medication  samples Follow up with primary care provider on 02/04/23 It is important to keep blood pressure and blood sugar under good control for overall health  Follow Up Plan: Telephone follow up appointment with care management team member scheduled for:   02/10/23 at 215 pm       CCM (HYPERTENSION) EXPECTED OUTCOME: MONITOR, SELF-MANAGE AND REDUCE SYMPTOMS OF HYPERTENSION       Current Barriers:  Knowledge Deficits related to Hypertension management Chronic Disease Management support and education needs related to Hypertension, diet and exercise No Advanced Directives in place- documents previously enrolled Patient reports he lives alone, has nephew that lives close by he can call on if needed, works part time at [employer] and has people there that assist him if needed. Patient reports he has not been checking blood pressure Patient states he decided he did not want to talk with psychiatrist (referral was done)  Planned Interventions: Evaluation of current treatment plan related to hypertension self management and patient's adherence to plan as established by provider;   Reviewed medications with patient and discussed importance of compliance;  Counseled on the importance of exercise goals with target of 150 minutes per week Advised patient, providing education and rationale, to monitor blood pressure daily and record, calling PCP for findings outside established parameters;  Reviewed scheduled/upcoming provider appointments including:  Advised patient to discuss medication side effects or any issues with medications with provider; Discussed complications of poorly controlled blood pressure such as heart disease, stroke, circulatory complications, vision complications, kidney impairment, sexual dysfunction;  Reviewed low sodium diet and healthy food choices, importance of reading labels Reviewed importance of keeping blood pressure and blood sugar within normal limits  Symptom  Management: Take medications as prescribed   Attend all scheduled provider appointments Call pharmacy for medication refills 3-7 days in advance of running out of medications Attend church or other social activities Perform all self care activities independently  Perform IADL's (shopping, preparing meals, housekeeping, managing finances) independently Call provider office for new concerns or questions  check blood pressure weekly choose a place to take my blood pressure (home, clinic or office, retail store) write blood pressure results in a log or diary learn about high blood pressure keep a blood pressure log take blood pressure log to all doctor appointments call doctor for signs and symptoms of high blood pressure keep all doctor appointments take medications for blood pressure exactly as prescribed begin an exercise program report new symptoms to your doctor eat more whole grains, fruits and vegetables, lean meats and healthy fats It is very important to keep blood pressure and blood sugar under good control Follow low sodium diet- read labels for sodium content Limit/ avoid fast food Please talk to your doctor if you are having any unwanted side effects of medications  Follow Up Plan: Telephone follow up appointment with care management team member scheduled for:   02/10/23 at 215 pm          The patient verbalized understanding of instructions, educational materials, and care plan provided today and DECLINED offer to receive copy of patient instructions, educational materials, and care plan.  Telephone  follow up appointment with care management team member scheduled for:  02/10/23 at 215 pm  Stroke Prevention Some medical conditions and lifestyle choices can lead to a higher risk for a stroke. You can help to prevent a stroke by eating healthy foods and exercising. It also helps to not smoke and to manage any health problems you may have. How can this condition affect me? A  stroke is an emergency. It should be treated right away. A stroke can lead to brain damage or threaten your life. There is a better chance of surviving and getting better after a stroke if you get medical help right away. What can increase my risk? The following medical conditions may increase your risk of a stroke: Diseases of the heart and blood vessels (cardiovascular disease). High blood pressure (hypertension). Diabetes. High cholesterol. Sickle cell disease. Problems with blood clotting. Being very overweight. Sleeping problems (obstructivesleep apnea). Other risk factors include: Being older than age 54. A history of blood clots, stroke, or mini-stroke (TIA). Race, ethnic background, or a family history of stroke. Smoking or using tobacco products. Taking birth control pills, especially if you smoke. Heavy alcohol and drug use. Not being active. What actions can I take to prevent this? Manage your health conditions High cholesterol. Eat a healthy diet. If this is not enough to manage your cholesterol, you may need to take medicines. Take medicines as told by your doctor. High blood pressure. Try to keep your blood pressure below 130/80. If your blood pressure cannot be managed through a healthy diet and regular exercise, you may need to take medicines. Take medicines as told by your doctor. Ask your doctor if you should check your blood pressure at home. Have your blood pressure checked every year. Diabetes. Eat a healthy diet and get regular exercise. If your blood sugar (glucose) cannot be managed through diet and exercise, you may need to take medicines. Take medicines as told by your doctor. Talk to your doctor about getting checked for sleeping problems. Signs of a problem can include: Snoring a lot. Feeling very tired. Make sure that you manage any other conditions you have. Nutrition  Follow instructions from your doctor about what to eat or drink. You may be told  to: Eat and drink fewer calories each day. Limit how much salt (sodium) you use to 1,500 milligrams (mg) each day. Use only healthy fats for cooking, such as olive oil, canola oil, and sunflower oil. Eat healthy foods. To do this: Choose foods that are high in fiber. These include whole grains, and fresh fruits and vegetables. Eat at least 5 servings of fruits and vegetables a day. Try to fill one-half of your plate with fruits and vegetables at each meal. Choose low-fat (lean) proteins. These include low-fat cuts of meat, chicken without skin, fish, tofu, beans, and nuts. Eat low-fat dairy products. Avoid foods that: Are high in salt. Have saturated fat. Have trans fat. Have cholesterol. Are processed or pre-made. Count how many carbohydrates you eat and drink each day. Lifestyle If you drink alcohol: Limit how much you have to: 0-1 drink a day for women who are not pregnant. 0-2 drinks a day for men. Know how much alcohol is in your drink. In the [place] one drink equals one 12 oz bottle of beer ( ), one 5 oz glass of wine ( ), or one 1 oz glass of hard liquor (44mL). Do not smoke or use any products that have nicotine or tobacco. If you need help quitting,  ask your doctor. Avoid secondhand smoke. Do not use drugs. Activity  Try to stay at a healthy weight. Get at least 30 minutes of exercise on most days, such as: Fast walking. Biking. Swimming. Medicines Take over-the-counter and prescription medicines only as told by your doctor. Avoid taking birth control pills. Talk to your doctor about the risks of taking birth control pills if: You are over 44 years old. You smoke. You get very bad headaches. You have had a blood clot. Where to find more information American Stroke Association: www.strokeassociation.org Get help right away if: You or a loved one has any signs of a stroke. "BE FAST" is an easy way to remember the warning signs: B - Balance. Dizziness, sudden  trouble walking, or loss of balance. E - Eyes. Trouble seeing or a change in how you see. F - Face. Sudden weakness or loss of feeling of the face. The face or eyelid may droop on one side. A - Arms. Weakness or loss of feeling in an arm. This happens all of a sudden and most often on one side of the body. S - Speech. Sudden trouble speaking, slurred speech, or trouble understanding what people say. T - Time. Time to call emergency services. Write down what time symptoms started. You or a loved one has other signs of a stroke, such as: A sudden, very bad headache with no known cause. Feeling like you may vomit (nausea). Vomiting. A seizure. These symptoms may be an emergency. Get help right away. Call your local emergency services (911 in the [place] Do not wait to see if the symptoms will go away. Do not drive yourself to the hospital. Summary You can help to prevent a stroke by eating healthy, exercising, and not smoking. It also helps to manage any health problems you have. Do not smoke or use any products that contain nicotine or tobacco. Get help right away if you or a loved one has any signs of a stroke. This information is not intended to replace advice given to you by your health care provider. Make sure you discuss any questions you have with your health care provider. Document Revised: 04/28/2022 Document Reviewed: 04/28/2022 Elsevier Patient Education  2024 [employer].

## 2022-12-07 DIAGNOSIS — Z794 Long term (current) use of insulin: Secondary | ICD-10-CM | POA: Diagnosis not present

## 2022-12-07 DIAGNOSIS — E1159 Type 2 diabetes mellitus with other circulatory complications: Secondary | ICD-10-CM

## 2022-12-07 DIAGNOSIS — I10 Essential (primary) hypertension: Secondary | ICD-10-CM

## 2022-12-18 ENCOUNTER — Ambulatory Visit: Payer: No Typology Code available for payment source | Admitting: Pharmacist

## 2022-12-18 DIAGNOSIS — E1169 Type 2 diabetes mellitus with other specified complication: Secondary | ICD-10-CM

## 2022-12-18 DIAGNOSIS — Z794 Long term (current) use of insulin: Secondary | ICD-10-CM

## 2022-12-18 NOTE — Progress Notes (Signed)
12/18/2022 Name: Randy [name] MRN: [id number] DOB: [birth year]  Chief Complaint  Patient presents with   Diabetes    Randy HAMMERMEISTER is a 72 y.o. year old male who presented for a telephone visit.  I connected with  Randy [name] on 01/27/23 by telephone and verified that I am speaking with the correct person using two identifiers. I discussed the limitations of evaluation and management by telemedicine. The patient expressed understanding and agreed to proceed.  Patient was located in her home and PharmD in PCP office during this visit.    They were referred to the pharmacist by their PCP for assistance in managing diabetes.    Subjective:  Care Team: Primary Care Provider: Dettinger, Elige Radon, MD ;  Medication Access/Adherence  Patient reports affordability concerns with their medications: Yes  Patient reports access/transportation concerns to their pharmacy: No  Patient reports adherence concerns with their medications:  Yes  cost   Diabetes:  Current medications: Basaglar, Humalog, Trulicity Medications tried in the past: canagliflozin, glipizide, metformin, Lantus, saxagliptin, Janumet, Ozempic  Current glucose readings: FBG up to 180  -Patient is injecting insulin 4 or more times daily -He would greatly benefit from a continuous glucose monitoring system (I.e. libre or dexcom)  Patient denies hypoglycemic s/sx including dizziness, shakiness, sweating. Patient reports hyperglycemic symptoms including polyuria, polydipsia, polyphagia, nocturia, neuropathy, blurred vision.  Current meal patterns:  Discussed meal planning options and Plate method for healthy eating Avoid sugary drinks and desserts Incorporate balanced protein, non starchy veggies, 1 serving of carbohydrate with each meal Increase water intake Increase physical activity as able  Current physical activity: encouraged as able  Current medication access support: re-enroll in [facility] patient  assistance   Objective:  Lab Results  Component Value Date   HGBA1C 9.3 (H) 11/04/2022    Lab Results  Component Value Date   CREATININE 1.36 (H) 11/26/2022   BUN 21 11/26/2022   NA 140 11/26/2022   K 3.7 11/26/2022   CL 95 (L) 11/26/2022   CO2 27 11/26/2022    Lab Results  Component Value Date   CHOL 189 11/04/2022   HDL 50 11/04/2022   LDLCALC 105 (H) 11/04/2022   TRIG 172 (H) 11/04/2022   CHOLHDL 3.8 11/04/2022    Medications Reviewed Today     Reviewed by Randy [name], [facility] (Pharmacist) on 01/27/23 at [phone]  Med List Status: <None>   Medication Order Taking? Sig Documenting Provider Last Dose Status Informant  amLODipine (NORVASC) 10 MG tablet [id number] No Take 1 tablet (10 mg total) by mouth daily. Dettinger, Elige Radon, MD Taking Active Self           Med Note Randy [name]   Tue Nov 04, 2022  3:43 PM) Last filled 08-13-22 #90  aspirin EC 81 MG tablet [id number] No Take 81 mg by mouth daily. [provider] Taking Active Self  citalopram (CELEXA) 40 MG tablet [id number] No Take 1 tablet (40 mg total) by mouth daily. Dettinger, Elige Radon, MD Taking Active   Dulaglutide (TRULICITY) 4.5 MG/0.5ML SOPN [id number]  Inject 4.5 mg as directed once a week. DX: E11.65 Dettinger, Elige Radon, MD  Active   glucose blood (ACCU-CHEK GUIDE) test strip [id number] No Use as instructed to test blood sugar daily. DX: E11.65 (patient using guide meter now) Dettinger, Elige Radon, MD Taking Active Self  glucose monitoring kit (FREESTYLE) monitoring kit [id number] No 1 each by Does not apply route 2 (two) times daily before  a meal.  Patient not taking: Reported on 11/26/2022   Dettinger, Elige Radon, MD Not Taking Active Self  hydrochlorothiazide (HYDRODIURIL) 25 MG tablet [id number] No Take 1 tablet (25 mg total) by mouth daily. Dettinger, Elige Radon, MD Taking Active Self           Med Note Randy [name]   Tue Nov 04, 2022  3:46 PM) Last filled 10-01-22 #90  hydrOXYzine (VISTARIL)  50 MG capsule [id number] No Take 1 capsule (50 mg total) by mouth 3 (three) times daily as needed. Dettinger, Elige Radon, MD Taking Active   Insulin Glargine [facility]) 100 UNIT/ML [id number]  Inject 70 Units into the skin daily. Dettinger, Elige Radon, MD  Active   insulin lispro (HUMALOG KWIKPEN) 100 UNIT/ML KwikPen [id number]  Inject 10 Units into the skin 3 (three) times daily with meals. Dettinger, Elige Radon, MD  Active   Insulin Pen Needle (GNP ULTICARE PEN NEEDLES) 32G X 6 MM MISC [id number] No Use 3 times daily DX: E11.65 Dettinger, Elige Radon, MD Taking Active Self  lidocaine (XYLOCAINE) 5 % ointment [id number] No Apply 1 application topically 3 (three) times daily as needed. Dettinger, Elige Radon, MD Taking Active Self  losartan (COZAAR) 100 MG tablet [id number] No Take 1 tablet (100 mg total) by mouth daily. Dettinger, Elige Radon, MD Taking Active Self           Med Note Randy [name]   Tue Nov 04, 2022  3:47 PM) Last filled 08-20-22 #90  Potassium Chloride ER 20 MEQ TBCR [id number] No Take 20 mEq by mouth daily.  [provider] Taking Active Self  Discontinued 01/27/23 0825 (Change in therapy)          Med Note Randy [name]   Tue Nov 04, 2022  3:47 PM) Last filled 08-20-22 #90  rosuvastatin (CRESTOR) 40 MG tablet [id number] No Take 1 tablet (40 mg total) by mouth daily. Randy Sam, MD Taking Active   ticagrelor (BRILINTA) 90 MG TABS tablet [id number] No Take 1 tablet (90 mg total) by mouth 2 (two) times daily. Danford, Earl Lites, MD Taking Active             Assessment/Plan:   Diabetes: - Currently uncontrolled - Reviewed long term cardiovascular and renal outcomes of uncontrolled blood sugar - Reviewed goal A1c, goal fasting, and goal 2 hour post prandial glucose -Medications refilled via [facility] patient assistance; reinforced continued compliance -duplicate statins --continue only rosuvastatin -was unable to pick up CGM (unsure why) - Meets  financial criteria for basaglar, trulicity, humalog patient assistance program through [place] [facility]. Will collaborate with provider, CPhT, and patient to re-enroll/refills.     Follow Up Plan: 3 months 30 min of patient care was provided to the patient during this visit time    Kieth Brightly, PharmD, BCACP Clinical Pharmacist, [facility]

## 2023-01-09 ENCOUNTER — Telehealth: Payer: Self-pay | Admitting: Pharmacist

## 2023-01-09 DIAGNOSIS — Z794 Long term (current) use of insulin: Secondary | ICD-10-CM

## 2023-01-09 MED ORDER — INSULIN LISPRO (1 UNIT DIAL) 100 UNIT/ML (KWIKPEN)
10.0000 [IU] | PEN_INJECTOR | Freq: Three times a day (TID) | SUBCUTANEOUS | 11 refills | Status: DC
Start: 2023-01-09 — End: 2023-12-23

## 2023-01-09 MED ORDER — TRULICITY 4.5 MG/0.5ML ~~LOC~~ SOAJ
4.5000 mg | SUBCUTANEOUS | 3 refills | Status: DC
Start: 2023-01-09 — End: 2023-12-23

## 2023-01-09 MED ORDER — BASAGLAR KWIKPEN 100 UNIT/ML ~~LOC~~ SOPN
70.0000 [IU] | PEN_INJECTOR | Freq: Every day | SUBCUTANEOUS | 5 refills | Status: DC
Start: 2023-01-09 — End: 2023-12-23

## 2023-01-09 NOTE — Telephone Encounter (Signed)
Can we make sure patient is still enrolled? I just escribed basaglar, humalog & trulicity to [facility]? I wasn't sure & didn't see notes in the chart Thank you!

## 2023-01-28 ENCOUNTER — Other Ambulatory Visit: Payer: Self-pay | Admitting: Family Medicine

## 2023-01-28 DIAGNOSIS — I10 Essential (primary) hypertension: Secondary | ICD-10-CM

## 2023-01-29 DIAGNOSIS — B351 Tinea unguium: Secondary | ICD-10-CM | POA: Diagnosis not present

## 2023-01-29 DIAGNOSIS — E1142 Type 2 diabetes mellitus with diabetic polyneuropathy: Secondary | ICD-10-CM | POA: Diagnosis not present

## 2023-01-29 DIAGNOSIS — M79676 Pain in unspecified toe(s): Secondary | ICD-10-CM | POA: Diagnosis not present

## 2023-01-29 DIAGNOSIS — L84 Corns and callosities: Secondary | ICD-10-CM | POA: Diagnosis not present

## 2023-02-04 ENCOUNTER — Ambulatory Visit (INDEPENDENT_AMBULATORY_CARE_PROVIDER_SITE_OTHER): Payer: No Typology Code available for payment source | Admitting: Family Medicine

## 2023-02-04 ENCOUNTER — Other Ambulatory Visit: Payer: Self-pay | Admitting: Family Medicine

## 2023-02-04 ENCOUNTER — Encounter: Payer: Self-pay | Admitting: Family Medicine

## 2023-02-04 VITALS — BP 129/60 | HR 96 | Ht 69.0 in | Wt 183.0 lb

## 2023-02-04 DIAGNOSIS — F411 Generalized anxiety disorder: Secondary | ICD-10-CM

## 2023-02-04 DIAGNOSIS — F339 Major depressive disorder, recurrent, unspecified: Secondary | ICD-10-CM

## 2023-02-04 DIAGNOSIS — E1169 Type 2 diabetes mellitus with other specified complication: Secondary | ICD-10-CM

## 2023-02-04 DIAGNOSIS — I10 Essential (primary) hypertension: Secondary | ICD-10-CM | POA: Diagnosis not present

## 2023-02-04 DIAGNOSIS — E782 Mixed hyperlipidemia: Secondary | ICD-10-CM | POA: Diagnosis not present

## 2023-02-04 DIAGNOSIS — Z794 Long term (current) use of insulin: Secondary | ICD-10-CM | POA: Diagnosis not present

## 2023-02-04 LAB — BAYER DCA HB A1C WAIVED: HB A1C (BAYER DCA - WAIVED): 8.5 % — ABNORMAL HIGH (ref 4.8–5.6)

## 2023-02-04 MED ORDER — CITALOPRAM HYDROBROMIDE 40 MG PO TABS: 40.0000 mg | ORAL_TABLET | Freq: Every day | ORAL | 3 refills | Status: DC

## 2023-02-04 MED ORDER — HYDROCHLOROTHIAZIDE 25 MG PO TABS: 25.0000 mg | ORAL_TABLET | Freq: Every day | ORAL | 3 refills | Status: DC

## 2023-02-04 MED ORDER — ROSUVASTATIN CALCIUM 40 MG PO TABS: 40.0000 mg | ORAL_TABLET | Freq: Every day | ORAL | 3 refills | Status: DC

## 2023-02-04 MED ORDER — AMLODIPINE BESYLATE 10 MG PO TABS: 10.0000 mg | ORAL_TABLET | Freq: Every day | ORAL | 3 refills | Status: DC

## 2023-02-04 NOTE — Progress Notes (Signed)
BP 129/60   Pulse 96   Ht 5\' 9"  (1.753 m)   Wt 183 lb (83 kg)   SpO2 95%   BMI 27.02 kg/m    Subjective:   Patient ID: Randy [name], male    DOB: 1950-07-20, 72 y.o.   MRN: [id number]  HPI: Randy [name] is a 72 y.o. male presenting on 02/04/2023 for Medical Management of Chronic Issues, Diabetes, Depression, and Recent CVA (Started on Brilinta)   HPI Depression and anxiety recheck Patient is coming in for depression and anxiety recheck.  Currently takes citalopram and seems to be doing well.  Denies any major issues or side effects.    02/04/2023   10:12 AM 02/04/2023   10:11 AM 11/17/2022    1:56 PM 10/29/2022    9:25 AM 07/09/2022    8:36 AM  Depression screen PHQ 2/9  Decreased Interest  0 0 0 0  Down, Depressed, Hopeless  0 0 0 0  PHQ - 2 Score  0 0 0 0  Altered sleeping 0  0 0 0  Tired, decreased energy 0  0 0 0  Change in appetite 0  0 0 0  Feeling bad or failure about yourself  0  0 0 0  Trouble concentrating 0  0 0 0  Moving slowly or fidgety/restless 0  0 0 0  Suicidal thoughts 0  0 0 0  PHQ-9 Score   0 0 0  Difficult doing work/chores Not difficult at all  Not difficult at all Not difficult at all Not difficult at all     Type 2 diabetes mellitus Patient comes in today for recheck of his diabetes. Patient has been currently taking Trulicity and Basaglar and Humalog. Patient is currently on an ACE inhibitor/ARB. Patient has not seen an ophthalmologist this year. Patient denies any new issues with their feet. The symptom started onset as an adult hypertension and hyperlipidemia ARE RELATED TO DM.  Brings home blood sugar testing and he tests in the a.m. and he is anywhere between 111 on some days and over 200 on other days.  He does admit that he sometimes eats cereal late at night and is not covering the cereal with Humalog and we encourage that anytime he eats carbs he needs to cover with Humalog.  Hypertension Patient is currently on amlodipine and  hydrochlorothiazide and losartan, and their blood pressure today is 129/60. Patient denies any lightheadedness or dizziness. Patient denies headaches, blurred vision, chest pains, shortness of breath, or weakness. Denies any side effects from medication and is content with current medication.   Hyperlipidemia Patient is coming in for recheck of his hyperlipidemia. The patient is currently taking Brilinta and Crestor. They deny any issues with myalgias or history of liver damage from it. They deny any focal numbness or weakness or chest pain.   Relevant past medical, surgical, family and social history reviewed and updated as indicated. Interim medical history since our last visit reviewed. Allergies and medications reviewed and updated.  Review of Systems  Constitutional:  Negative for chills and fever.  Eyes:  Negative for visual disturbance.  Respiratory:  Negative for shortness of breath and wheezing.   Cardiovascular:  Negative for chest pain and leg swelling.  Musculoskeletal:  Negative for back pain and gait problem.  Skin:  Negative for rash.  Neurological:  Negative for dizziness, weakness and light-headedness.  All other systems reviewed and are negative.   Per HPI unless specifically indicated above  Allergies as of 02/04/2023   No Known Allergies      Medication List        Accurate as of February 04, 2023 10:38 AM. If you have any questions, ask your nurse or doctor.          Accu-Chek Guide test strip Generic drug: glucose blood Use as instructed to test blood sugar daily. DX: E11.65 (patient using guide meter now)   amLODipine 10 MG tablet Commonly known as: NORVASC TAKE ONE (1) TABLET BY MOUTH EVERY DAY   aspirin EC 81 MG tablet Take 81 mg by mouth daily.   Basaglar KwikPen 100 UNIT/ML Inject 70 Units into the skin daily.   Brilinta 90 MG Tabs tablet Generic drug: ticagrelor Take 1 tablet (90 mg total) by mouth 2 (two) times daily.   citalopram 40  MG tablet Commonly known as: CELEXA TAKE ONE (1) TABLET BY MOUTH EVERY DAY What changed: how much to take Changed by: Ivin Booty A Jordell Outten   glucose monitoring kit monitoring kit 1 each by Does not apply route 2 (two) times daily before a meal.   GNP UltiCare Pen Needles 32G X 6 MM Misc Generic drug: Insulin Pen Needle Use 3 times daily DX: E11.65   hydrochlorothiazide 25 MG tablet Commonly known as: HYDRODIURIL Take 1 tablet (25 mg total) by mouth daily.   hydrOXYzine 50 MG capsule Commonly known as: VISTARIL Take 1 capsule (50 mg total) by mouth 3 (three) times daily as needed.   insulin lispro 100 UNIT/ML KwikPen Commonly known as: HumaLOG KwikPen Inject 10 Units into the skin 3 (three) times daily with meals.   lidocaine 5 % ointment Commonly known as: XYLOCAINE Apply 1 application topically 3 (three) times daily as needed.   losartan 100 MG tablet Commonly known as: COZAAR TAKE ONE (1) TABLET BY MOUTH EVERY DAY What changed: how much to take Changed by: Elige Radon Dauntae Derusha   Potassium Chloride ER 20 MEQ Tbcr Take 20 mEq by mouth daily.   rosuvastatin 40 MG tablet Commonly known as: CRESTOR Take 1 tablet (40 mg total) by mouth daily.   Trulicity 4.5 MG/0.5ML Sopn Generic drug: Dulaglutide Inject 4.5 mg as directed once a week. DX: E11.65         Objective:   BP 129/60   Pulse 96   Ht 5\' 9"  (1.753 m)   Wt 183 lb (83 kg)   SpO2 95%   BMI 27.02 kg/m   Wt Readings from Last 3 Encounters:  02/04/23 183 lb (83 kg)  11/17/22 181 lb (82.1 kg)  11/04/22 180 lb (81.6 kg)    Physical Exam Vitals and nursing note reviewed.  Constitutional:      General: He is not in acute distress.    Appearance: He is well-developed. He is not diaphoretic.  Eyes:     General: No scleral icterus.    Conjunctiva/sclera: Conjunctivae normal.  Neck:     Thyroid: No thyromegaly.  Cardiovascular:     Rate and Rhythm: Normal rate and regular rhythm.     Heart sounds: Normal  heart sounds. No murmur heard. Pulmonary:     Effort: Pulmonary effort is normal. No respiratory distress.     Breath sounds: Normal breath sounds. No wheezing.  Musculoskeletal:        General: No swelling.     Cervical back: Neck supple.  Lymphadenopathy:     Cervical: No cervical adenopathy.  Skin:    General: Skin is warm and dry.  Findings: No rash.  Neurological:     Mental Status: He is alert and oriented to person, place, and time.     Coordination: Coordination normal.  Psychiatric:        Behavior: Behavior normal.       Assessment & Plan:   Problem List Items Addressed This Visit       Cardiovascular and Mediastinum   Essential hypertension, benign   Relevant Medications   amLODipine (NORVASC) 10 MG tablet   rosuvastatin (CRESTOR) 40 MG tablet   hydrochlorothiazide (HYDRODIURIL) 25 MG tablet     Endocrine   Type 2 diabetes mellitus with other specified complication (HCC) - Primary   Relevant Medications   rosuvastatin (CRESTOR) 40 MG tablet   Other Relevant Orders   Bayer DCA Hb A1c Waived     Other   Mixed hyperlipidemia   Relevant Medications   amLODipine (NORVASC) 10 MG tablet   rosuvastatin (CRESTOR) 40 MG tablet   hydrochlorothiazide (HYDRODIURIL) 25 MG tablet   Depression, recurrent (HCC)   Relevant Medications   citalopram (CELEXA) 40 MG tablet   GAD (generalized anxiety disorder)   Relevant Medications   citalopram (CELEXA) 40 MG tablet    A1c is 8.5 which is better than last time, continue to monitor, encouraged to make sure he takes mealtime insulin if he is eating, even if it is a late night snack that is not part of his normal meal if it has carbs. Follow up plan: Return in about 3 months (around 05/07/2023), or if symptoms worsen or fail to improve, for Depression and anxiety.  Counseling provided for all of the vaccine components Orders Placed This Encounter  Procedures   Bayer DCA Hb A1c Waived    Arville Care,  MD [facility] 02/04/2023, 10:38 AM

## 2023-02-10 ENCOUNTER — Telehealth: Payer: No Typology Code available for payment source

## 2023-02-10 ENCOUNTER — Encounter: Payer: Self-pay | Admitting: *Deleted

## 2023-02-10 ENCOUNTER — Other Ambulatory Visit: Payer: No Typology Code available for payment source | Admitting: *Deleted

## 2023-02-10 NOTE — Patient Instructions (Signed)
Visit Information  Thank you for taking time to visit with me today. Please don't hesitate to contact me if I can be of assistance to you before our next scheduled telephone appointment.  Following are the goals we discussed today:   Goals Addressed             This Visit's Progress    COMPLETED: CCM (DIABETES) EXPECTED OUTCOME:  MONITOR, SELF-MANAGE AND REDUCE SYMPTOMS OF DIABETES       Current Barriers:  Knowledge Deficits related to Diabetes management Chronic Disease Management support and education needs related to Diabetes and diet No Advanced Directives in place- documents being mailed Patient reports he checks CBG once daily with readings fasting 100's range with today's reading 120.  Pt reports he does not follow a special diet, drinks diet soft drinks, recent AIC on 02/04/23 is 8.5 Patient does not exercise, used to swim at [facility] and may start back Patient reports he has all medication including insulin Patient reports hospitalized 5/28-5/29/24 for CVA, states evaluated for OT on 11/12/22 with no further services needed, pt states he does not have any residual effects from CVA, feels he is doing well Patient reports he feels overall he is doing well, continues to work part time No new concerns reported, pt does not feel he has any further needs for RN care management, pharmacist continues to work with pt  Planned Interventions: Reviewed medications with patient and discussed importance of medication adherence;        Counseled on importance of regular laboratory monitoring as prescribed;        Reviewed scheduled/upcoming provider appointments including: primary care provider 02/04/23;         call provider for findings outside established parameters;       Review of patient status, including review of consultants reports, relevant laboratory and other test results, and medications completed;       Advised patient to discuss any issues with blood sugar, medications with provider;       RN care manager reinforced with patient that he needs to contact pharmacist for medication samples before he runs out of medication Reviewed plan of care with pt including case closure for nursing  Symptom Management: Take medications as prescribed   Attend all scheduled provider appointments Call pharmacy for medication refills 3-7 days in advance of running out of medications Attend church or other social activities Perform all self care activities independently  Perform IADL's (shopping, preparing meals, housekeeping, managing finances) independently Call provider office for new concerns or questions  check blood sugar at prescribed times: once daily check feet daily for cuts, sores or redness enter blood sugar readings and medication or insulin into daily log take the blood sugar log to all doctor visits take the blood sugar meter to all doctor visits trim toenails straight across drink 6 to 8 glasses of water each day fill half of plate with vegetables limit fast food meals to no more than 1 per week manage portion size prepare main meal at home 3 to 5 days each week read food labels for fat, fiber, carbohydrates and portion size keep feet up while sitting wash and dry feet carefully every day wear comfortable, cotton socks wear comfortable, well-fitting shoes Please contact pharmacist at primary care provider when you need medication samples It is important to keep blood pressure and blood sugar under good control for overall health Case closure for nurse only        COMPLETED: CCM (HYPERTENSION) EXPECTED  OUTCOME: MONITOR, SELF-MANAGE AND REDUCE SYMPTOMS OF HYPERTENSION       Current Barriers:  Knowledge Deficits related to Hypertension management Chronic Disease Management support and education needs related to Hypertension, diet and exercise No Advanced Directives in place- documents previously enrolled Patient reports he lives alone, has nephew that lives close by  he can call on if needed, works part time at [employer] and has people there that assist him if needed. Patient reports he has not been checking blood pressure Patient states he decided he did not want to talk with psychiatrist (referral was done)  Planned Interventions: Evaluation of current treatment plan related to hypertension self management and patient's adherence to plan as established by provider;   Reviewed medications with patient and discussed importance of compliance;  Counseled on the importance of exercise goals with target of 150 minutes per week Advised patient, providing education and rationale, to monitor blood pressure daily and record, calling PCP for findings outside established parameters;  Reviewed scheduled/upcoming provider appointments including:  Advised patient to discuss medication side effects or any issues with medications with provider; Discussed complications of poorly controlled blood pressure such as heart disease, stroke, circulatory complications, vision complications, kidney impairment, sexual dysfunction;  Reinforced low sodium diet and healthy food choices, importance of reading labels Reinforced importance of keeping blood pressure and blood sugar within normal limits  Symptom Management: Take medications as prescribed   Attend all scheduled provider appointments Call pharmacy for medication refills 3-7 days in advance of running out of medications Attend church or other social activities Perform all self care activities independently  Perform IADL's (shopping, preparing meals, housekeeping, managing finances) independently Call provider office for new concerns or questions  check blood pressure weekly choose a place to take my blood pressure (home, clinic or office, retail store) write blood pressure results in a log or diary learn about high blood pressure keep a blood pressure log take blood pressure log to all doctor appointments call  doctor for signs and symptoms of high blood pressure keep all doctor appointments take medications for blood pressure exactly as prescribed begin an exercise program report new symptoms to your doctor eat more whole grains, fruits and vegetables, lean meats and healthy fats It is very important to keep blood pressure and blood sugar under good control Follow low sodium diet- read labels for sodium content Limit/ avoid fast food Please talk to your doctor if you are having any unwanted side effects of medications           Please call the care guide team at [phone] if you need to cancel or reschedule your appointment.   If you are experiencing a Mental Health or Behavioral Health Crisis or need someone to talk to, please call the Suicide and Crisis Lifeline: 988 call the [place] National Suicide Prevention Lifeline: (330) 490-2120 or TTY: 408-650-9760 TTY 623-567-7945) to talk to a trained counselor call 1-800-273-TALK (toll free, 24 hour hotline) go to [facility] [address], [place] [phone]) call the [facility] Line: [phone] call 911   The patient verbalized understanding of instructions, educational materials, and care plan provided today and DECLINED offer to receive copy of patient instructions, educational materials, and care plan.   No further follow up required: case closure  Randy [name] [facility], BSN [place]/ Ambulatory Care Management [phone]

## 2023-02-10 NOTE — Patient Outreach (Signed)
Care Management   Visit Note  02/10/2023 Name: NYAN WHAN MRN: [id number] DOB: 1951-05-08  Subjective: Chesney Bermudez Meline is a 72 y.o. year old male who is a primary care patient of Dettinger, Elige Radon, MD. The Care Management team was consulted for assistance.      Engaged with patient spoke with patient by telephone for follow up   Goals Addressed             This Visit's Progress    COMPLETED: CCM (DIABETES) EXPECTED OUTCOME:  MONITOR, SELF-MANAGE AND REDUCE SYMPTOMS OF DIABETES       Current Barriers:  Knowledge Deficits related to Diabetes management Chronic Disease Management support and education needs related to Diabetes and diet No Advanced Directives in place- documents being mailed Patient reports he checks CBG once daily with readings fasting 100's range with today's reading 120.  Pt reports he does not follow a special diet, drinks diet soft drinks, recent AIC on 02/04/23 is 8.5 Patient does not exercise, used to swim at [facility] and may start back Patient reports he has all medication including insulin Patient reports hospitalized 5/28-5/29/24 for CVA, states evaluated for OT on 11/12/22 with no further services needed, pt states he does not have any residual effects from CVA, feels he is doing well Patient reports he feels overall he is doing well, continues to work part time No new concerns reported, pt does not feel he has any further needs for RN care management, pharmacist continues to work with pt  Planned Interventions: Reviewed medications with patient and discussed importance of medication adherence;        Counseled on importance of regular laboratory monitoring as prescribed;        Reviewed scheduled/upcoming provider appointments including: primary care provider 02/04/23;         call provider for findings outside established parameters;       Review of patient status, including review of consultants reports, relevant laboratory and other test results, and  medications completed;       Advised patient to discuss any issues with blood sugar, medications with provider;      RN care manager reinforced with patient that he needs to contact pharmacist for medication samples before he runs out of medication Reviewed plan of care with pt including case closure for nursing  Symptom Management: Take medications as prescribed   Attend all scheduled provider appointments Call pharmacy for medication refills 3-7 days in advance of running out of medications Attend church or other social activities Perform all self care activities independently  Perform IADL's (shopping, preparing meals, housekeeping, managing finances) independently Call provider office for new concerns or questions  check blood sugar at prescribed times: once daily check feet daily for cuts, sores or redness enter blood sugar readings and medication or insulin into daily log take the blood sugar log to all doctor visits take the blood sugar meter to all doctor visits trim toenails straight across drink 6 to 8 glasses of water each day fill half of plate with vegetables limit fast food meals to no more than 1 per week manage portion size prepare main meal at home 3 to 5 days each week read food labels for fat, fiber, carbohydrates and portion size keep feet up while sitting wash and dry feet carefully every day wear comfortable, cotton socks wear comfortable, well-fitting shoes Please contact pharmacist at primary care provider when you need medication samples It is important to keep blood pressure and blood sugar  under good control for overall health Case closure for nurse only        COMPLETED: CCM (HYPERTENSION) EXPECTED OUTCOME: MONITOR, SELF-MANAGE AND REDUCE SYMPTOMS OF HYPERTENSION       Current Barriers:  Knowledge Deficits related to Hypertension management Chronic Disease Management support and education needs related to Hypertension, diet and exercise No Advanced  Directives in place- documents previously enrolled Patient reports he lives alone, has nephew that lives close by he can call on if needed, works part time at [employer] and has people there that assist him if needed. Patient reports he has not been checking blood pressure Patient states he decided he did not want to talk with psychiatrist (referral was done)  Planned Interventions: Evaluation of current treatment plan related to hypertension self management and patient's adherence to plan as established by provider;   Reviewed medications with patient and discussed importance of compliance;  Counseled on the importance of exercise goals with target of 150 minutes per week Advised patient, providing education and rationale, to monitor blood pressure daily and record, calling PCP for findings outside established parameters;  Reviewed scheduled/upcoming provider appointments including:  Advised patient to discuss medication side effects or any issues with medications with provider; Discussed complications of poorly controlled blood pressure such as heart disease, stroke, circulatory complications, vision complications, kidney impairment, sexual dysfunction;  Reinforced low sodium diet and healthy food choices, importance of reading labels Reinforced importance of keeping blood pressure and blood sugar within normal limits  Symptom Management: Take medications as prescribed   Attend all scheduled provider appointments Call pharmacy for medication refills 3-7 days in advance of running out of medications Attend church or other social activities Perform all self care activities independently  Perform IADL's (shopping, preparing meals, housekeeping, managing finances) independently Call provider office for new concerns or questions  check blood pressure weekly choose a place to take my blood pressure (home, clinic or office, retail store) write blood pressure results in a log or  diary learn about high blood pressure keep a blood pressure log take blood pressure log to all doctor appointments call doctor for signs and symptoms of high blood pressure keep all doctor appointments take medications for blood pressure exactly as prescribed begin an exercise program report new symptoms to your doctor eat more whole grains, fruits and vegetables, lean meats and healthy fats It is very important to keep blood pressure and blood sugar under good control Follow low sodium diet- read labels for sodium content Limit/ avoid fast food Please talk to your doctor if you are having any unwanted side effects of medications           Plan: Case closure  Irving Shows [facility], BSN [facility]/ Ambulatory Care Management [phone]

## 2023-02-16 ENCOUNTER — Telehealth: Payer: Self-pay | Admitting: Family Medicine

## 2023-02-16 DIAGNOSIS — G8929 Other chronic pain: Secondary | ICD-10-CM

## 2023-02-16 NOTE — Telephone Encounter (Signed)
REFERRAL REQUEST Telephone Note  Have you been seen at our office for this problem? Orthopedic for RT shoulder (Advise that they may need an appointment with their PCP before a referral can be done)  Reason for Referral: yes at last OV Referral discussed with patient: yes at last OV  Best contact number of patient for referral team: [phone]    Has patient been seen by a specialist for this issue before: no  Patient provider preference for referral: [facility] Patient location preference for referral: [place]   Patient notified that referrals can take up to a week or longer to process. If they haven't heard anything within a week they should call back and speak with the referral department.

## 2023-02-17 DIAGNOSIS — Z6827 Body mass index (BMI) 27.0-27.9, adult: Secondary | ICD-10-CM | POA: Diagnosis not present

## 2023-02-17 DIAGNOSIS — Z008 Encounter for other general examination: Secondary | ICD-10-CM | POA: Diagnosis not present

## 2023-02-17 DIAGNOSIS — F17211 Nicotine dependence, cigarettes, in remission: Secondary | ICD-10-CM | POA: Diagnosis not present

## 2023-02-17 DIAGNOSIS — E663 Overweight: Secondary | ICD-10-CM | POA: Diagnosis not present

## 2023-02-17 DIAGNOSIS — Z794 Long term (current) use of insulin: Secondary | ICD-10-CM | POA: Diagnosis not present

## 2023-02-17 DIAGNOSIS — E1169 Type 2 diabetes mellitus with other specified complication: Secondary | ICD-10-CM | POA: Diagnosis not present

## 2023-02-17 DIAGNOSIS — E785 Hyperlipidemia, unspecified: Secondary | ICD-10-CM | POA: Diagnosis not present

## 2023-02-17 DIAGNOSIS — N1831 Chronic kidney disease, stage 3a: Secondary | ICD-10-CM | POA: Diagnosis not present

## 2023-02-17 DIAGNOSIS — E1122 Type 2 diabetes mellitus with diabetic chronic kidney disease: Secondary | ICD-10-CM | POA: Diagnosis not present

## 2023-02-17 DIAGNOSIS — E1136 Type 2 diabetes mellitus with diabetic cataract: Secondary | ICD-10-CM | POA: Diagnosis not present

## 2023-02-20 NOTE — Telephone Encounter (Signed)
Pt made aware. He was advised to call the office back if has not received a phone call in regards to an appt in 2 weeks time.

## 2023-02-20 NOTE — Telephone Encounter (Signed)
Referral placed.

## 2023-03-04 ENCOUNTER — Other Ambulatory Visit (INDEPENDENT_AMBULATORY_CARE_PROVIDER_SITE_OTHER): Payer: No Typology Code available for payment source

## 2023-03-04 ENCOUNTER — Encounter: Payer: Self-pay | Admitting: Orthopedic Surgery

## 2023-03-04 ENCOUNTER — Ambulatory Visit (INDEPENDENT_AMBULATORY_CARE_PROVIDER_SITE_OTHER): Payer: No Typology Code available for payment source | Admitting: Orthopedic Surgery

## 2023-03-04 VITALS — Ht 69.0 in | Wt 185.0 lb

## 2023-03-04 DIAGNOSIS — M25511 Pain in right shoulder: Secondary | ICD-10-CM

## 2023-03-04 NOTE — Patient Instructions (Addendum)
Consider topical treatments for your shoulder.  There are many different types of sprays, creams, lotions and patches.  I recommend Diclofenac/Voltaren gel   You can also try ice hot, biofreeze, menthol treatments or lidocaine.  There are many options available over the counter.  If your pain continues, or worsens, you can consider an injection of the [facility] joint of your right shoulder.

## 2023-03-04 NOTE — Progress Notes (Signed)
New Patient Visit  Assessment: Randy [name] is a 72 y.o. male with the following: 1. Arthralgia of right acromioclavicular joint  Plan: Randy [name] has pain in the right AC joint.  No prior injury.  On radiographs, he has some moderate degenerative changes, including loss of joint space, and some associated osteophytes.  We discussed many treatment options, including Tylenol, limited use of NSAIDs.  In addition, he can try topical treatments including Voltaren gel, IcyHot, menthol treatments and/or patches.  If his pain persist, he can consider an injection.  He would like to try the topical treatments for now.  I think this is reasonable.  He will return to clinic as needed.  Follow-up: Return if symptoms worsen or fail to improve.  Subjective:  Chief Complaint  Patient presents with   Shoulder Pain    R for 2 mos no injury    History of Present Illness: Randy [name] is a 72 y.o. male who has been referred by Arville Care, MD for evaluation of right shoulder pain.  He is right-hand dominant.  He said pain in the superior aspect of the right shoulder for the past 2 months.  No specific injury.  No prior injuries to the right shoulder.  He has some pain with overhead motion.  He has been taking Tylenol as needed.  No prior injections.  No physical therapy.   Review of Systems: No fevers or chills No numbness or tingling No chest pain No shortness of breath No bowel or bladder dysfunction No GI distress No headaches   Medical History:  Past Medical History:  Diagnosis Date   Diabetes mellitus without complication (HCC)    Hyperlipidemia    Hypertension     Past Surgical History:  Procedure Laterality Date   HEMORRHOID SURGERY N/A 06/08/2013   Procedure: HEMORRHOIDECTOMY;  Surgeon: Romie Levee, MD;  Location: [facility] [facility];  Service: General;  Laterality: N/A;   NO PAST SURGERIES      History reviewed. No pertinent family history. Social  History   Tobacco Use   Smoking status: Former    Current packs/day: 0.00    Average packs/day: 1 pack/day for 40.0 years (40.0 ttl pk-yrs)    Types: Pipe, Cigarettes    Start date: 02/08/1980    Quit date: 02/08/2020    Years since quitting: 3.0   Smokeless tobacco: Never   Tobacco comments:    Less than 1 pk per day  Vaping Use   Vaping status: Never Used  Substance Use Topics   Alcohol use: Never    Comment: rarely   Drug use: No    No Known Allergies  Current Meds  Medication Sig   amLODipine (NORVASC) 10 MG tablet Take 1 tablet (10 mg total) by mouth daily.   aspirin EC 81 MG tablet Take 81 mg by mouth daily.   citalopram (CELEXA) 40 MG tablet Take 1 tablet (40 mg total) by mouth daily.   citalopram (CELEXA) 40 MG tablet TAKE ONE (1) TABLET BY MOUTH EVERY DAY   Dulaglutide (TRULICITY) 4.5 MG/0.5ML SOPN Inject 4.5 mg as directed once a week. DX: E11.65   glucose blood (ACCU-CHEK GUIDE) test strip Use as instructed to test blood sugar daily. DX: E11.65 (patient using guide meter now)   glucose monitoring kit (FREESTYLE) monitoring kit 1 each by Does not apply route 2 (two) times daily before a meal.   hydrochlorothiazide (HYDRODIURIL) 25 MG tablet Take 1 tablet (25 mg total) by mouth daily.  Insulin Glargine (BASAGLAR KWIKPEN) 100 UNIT/ML Inject 70 Units into the skin daily.   insulin lispro (HUMALOG KWIKPEN) 100 UNIT/ML KwikPen Inject 10 Units into the skin 3 (three) times daily with meals.   Insulin Pen Needle (GNP ULTICARE PEN NEEDLES) 32G X 6 MM MISC Use 3 times daily DX: E11.65   lidocaine (XYLOCAINE) 5 % ointment Apply 1 application topically 3 (three) times daily as needed.   losartan (COZAAR) 100 MG tablet TAKE ONE (1) TABLET BY MOUTH EVERY DAY   Potassium Chloride ER 20 MEQ TBCR Take 20 mEq by mouth daily.    rosuvastatin (CRESTOR) 40 MG tablet Take 1 tablet (40 mg total) by mouth daily.   ticagrelor (BRILINTA) 90 MG TABS tablet Take 1 tablet (90 mg total) by mouth 2  (two) times daily.    Objective: Ht 5\' 9"  (1.753 m)   Wt 185 lb (83.9 kg)   BMI 27.32 kg/m   Physical Exam:  General: Alert and oriented. and No acute distress. Gait: Normal gait.  Evaluation of right shoulder demonstrates no bruising.  No swelling.  No redness.  He does have mild deformity at the [facility] joint.  Tenderness to palpation in this area.  Pain in the [facility] joint with cross body adduction.  He has good range of motion of the right shoulder.  Good strength.  He does have some pain in the [facility] joint, with forward flexion.  Fingers are warm well-perfused.  Sensation intact throughout the right arm.  IMAGING: I personally ordered and reviewed the following images  X-rays of the right shoulder were obtained in clinic today.  No acute injuries are noted.  Well-positioned glenohumeral joint space.  Mild loss of glenohumeral joint space.  No osteophytes are appreciated.  There is loss of joint space within the [facility] joint.  There are some mild osteophytes associated.  No bony lesions.  Impression: Right shoulder x-rays without acute injury with moderate degenerative changes of the [facility] joint   New Medications:  No orders of the defined types were placed in this encounter.     Oliver Barre, MD  03/04/2023 8:48 AM

## 2023-03-10 ENCOUNTER — Ambulatory Visit: Payer: No Typology Code available for payment source | Admitting: Pharmacist

## 2023-03-10 DIAGNOSIS — E119 Type 2 diabetes mellitus without complications: Secondary | ICD-10-CM | POA: Diagnosis not present

## 2023-03-10 DIAGNOSIS — M9905 Segmental and somatic dysfunction of pelvic region: Secondary | ICD-10-CM | POA: Diagnosis not present

## 2023-03-10 DIAGNOSIS — M62830 Muscle spasm of back: Secondary | ICD-10-CM | POA: Diagnosis not present

## 2023-03-10 DIAGNOSIS — Z794 Long term (current) use of insulin: Secondary | ICD-10-CM | POA: Diagnosis not present

## 2023-03-10 DIAGNOSIS — M9904 Segmental and somatic dysfunction of sacral region: Secondary | ICD-10-CM | POA: Diagnosis not present

## 2023-03-10 DIAGNOSIS — M9903 Segmental and somatic dysfunction of lumbar region: Secondary | ICD-10-CM | POA: Diagnosis not present

## 2023-03-10 NOTE — Progress Notes (Signed)
03/10/2023 Name: Randy [name] MRN: [id number] DOB: 1950/10/18  Chief Complaint  Patient presents with   Diabetes    Randy [name] is a 72 y.o. year old male who was referred for medication management by their primary care provider, Dettinger, Elige Radon, MD. They presented for a face to face visit today.   They were referred to the pharmacist by their PCP for assistance in managing diabetes   Subjective:  Care Team: Primary Care Provider: Dettinger, Elige Radon, MD ; Next Scheduled Visit: 05/20/23  Medication Access/Adherence  Current Pharmacy:  [facility], [place] - [address] [place] [address] Phone: [phone] Fax: [phone]  [facility] [address] [place] - [address] [place] [address] [place] [address] Phone: [phone] Fax: [phone]  [facility] - [place], [place] - [address] [place] [address] Phone: [phone] Fax: [phone]  [facility] [address] [place] [address] Phone: [phone] Fax: [phone]   Patient reports affordability concerns with their medications: No  Patient reports access/transportation concerns to their pharmacy: No  Patient reports adherence concerns with their medications:  No    Diabetes:  Current medications: Basaglar (insulin glargine) 75 units daily, Humalog (insulin lispro) 15 units with meals, Trulicity (dapagliflozin) 4.5 mg weekly. Medications tried in the past: [place] (dapagliflozin)- frequent urination- not willing to retrial.  Notices that Trulicity needle is usually bent when he removes it. Occasional bruising.   Current glucose readings: not checking post-prandially FBG: 88, 99, 106, 111, 112,   Date of Download: has FL3 sensor and reader at home. Reader stopped working after he went through [employer] with sensor. Planning to reapply sensor  tomorrow.   Patient denies hypoglycemic s/sx including dizziness, shakiness, sweating. Patient denies hyperglycemic symptoms including polyuria, polydipsia, polyphagia, nocturia, neuropathy, blurred vision.  Current meal patterns: 3 meals/day - usually eating at restaurants. Likes green beans, pinto beans, corn, cabbage,  - Drinks diet mountain dew, one bottle water/day.   Physical activity: currently limited by back pain   Objective:  Lab Results  Component Value Date   HGBA1C 8.5 (H) 02/04/2023    Lab Results  Component Value Date   CREATININE 1.36 (H) 11/26/2022   BUN 21 11/26/2022   NA 140 11/26/2022   K 3.7 11/26/2022   CL 95 (L) 11/26/2022   CO2 27 11/26/2022    Lab Results  Component Value Date   CHOL 189 11/04/2022   HDL 50 11/04/2022   LDLCALC 105 (H) 11/04/2022   TRIG 172 (H) 11/04/2022   CHOLHDL 3.8 11/04/2022    Medications Reviewed Today     Reviewed by Particia Lather, RPH (Pharmacist) on 03/10/23 at 1445  Med List Status: <None>   Medication Order Taking? Sig Documenting Provider Last Dose Status Informant  amLODipine (NORVASC) 10 MG tablet [id number] Yes Take 1 tablet (10 mg total) by mouth daily. Dettinger, Elige Radon, MD Taking Active   aspirin EC 81 MG tablet [id number] Yes Take 81 mg by mouth daily. [provider] Taking Active Self  citalopram (CELEXA) 40 MG tablet [id number] Yes Take 1 tablet (40 mg total) by mouth daily. Dettinger, Elige Radon, MD Taking Active   citalopram (CELEXA) 40 MG tablet [id number]  TAKE ONE (1) TABLET BY MOUTH EVERY DAY Dettinger, Elige Radon, MD  Active   Dulaglutide (TRULICITY) 4.5 MG/0.5ML SOPN [id number] Yes Inject 4.5  mg as directed once a week. DX: E11.65 Dettinger, Elige Radon, MD Taking Active   glucose blood (ACCU-CHEK GUIDE) test strip [id number]  Use as instructed to test blood sugar daily. DX: E11.65 (patient using guide meter now) Dettinger, Elige Radon, MD  Active Self  glucose monitoring kit (FREESTYLE) monitoring kit  [id number]  1 each by Does not apply route 2 (two) times daily before a meal. Dettinger, Elige Radon, MD  Active Self  hydrochlorothiazide (HYDRODIURIL) 25 MG tablet [id number] Yes Take 1 tablet (25 mg total) by mouth daily. Dettinger, Elige Radon, MD Taking Active   hydrOXYzine (VISTARIL) 50 MG capsule [id number]  Take 1 capsule (50 mg total) by mouth 3 (three) times daily as needed. Dettinger, Elige Radon, MD  Consider Medication Status and Discontinue (Completed Course)   Insulin Glargine (BASAGLAR KWIKPEN) 100 UNIT/ML [id number] Yes Inject 70 Units into the skin daily. Dettinger, Elige Radon, MD Taking Active   insulin lispro (HUMALOG KWIKPEN) 100 UNIT/ML KwikPen [id number] Yes Inject 10 Units into the skin 3 (three) times daily with meals. Dettinger, Elige Radon, MD Taking Active   Insulin Pen Needle (GNP ULTICARE PEN NEEDLES) 32G X 6 MM MISC [id number]  Use 3 times daily DX: E11.65 Dettinger, Elige Radon, MD  Active Self  lidocaine (XYLOCAINE) 5 % ointment [id number]  Apply 1 application topically 3 (three) times daily as needed. Dettinger, Elige Radon, MD  Active Self  losartan (COZAAR) 100 MG tablet [id number] Yes TAKE ONE (1) TABLET BY MOUTH EVERY DAY Dettinger, Elige Radon, MD Taking Active   Potassium Chloride ER 20 MEQ TBCR [id number] Yes Take 20 mEq by mouth daily.  [provider] Taking Active Self  rosuvastatin (CRESTOR) 40 MG tablet [id number]  Take 1 tablet (40 mg total) by mouth daily. Dettinger, Elige Radon, MD  Active   ticagrelor (BRILINTA) 90 MG TABS tablet [id number]  Take 1 tablet (90 mg total) by mouth 2 (two) times daily. Danford, Earl Lites, MD  Consider Medication Status and Discontinue (Completed Course)             Assessment/Plan:   Diabetes: - Currently uncontrolled with most recent A1c of 8.5% above goal <7%, though improved from 9.3%. Pt had increased basal and prandial insulin by 5 units prior to last A1c, likely contributing to decreased A1C. Pt does not have FL3 sensor/reader with  him today. Would consider increasing prandial insulin to 18 units with each meal (20% increase), but would prefer to see continuous BG prior to escalating therapy. Reassuring that pt is not endorsing symptoms of hypoglycemia. Pt agreeable to re-try [facility] reader with new sensor this week - will reach out to office if it does not work (can request new device from [employer]). Noted that pt has restarted moderate-intensity statin, after being discharged with rosuvastatin 40 mg after hospitalization for CVA in 2024. Pt agreeable to restart high-intensity statin. - Reviewed long term cardiovascular and renal outcomes of uncontrolled blood sugar - Reviewed goal A1c, goal fasting, and goal 2 hour post prandial glucose - Recommend to continue insulin glargine (Basaglar) 75 units daily, insulin lispro (Humalog) 15 units TID. - Recommend to continue Trulicity (dulaglutide) 4.5 mg weekly  - Instructed patient to switch to high-intensity statin when current supply of pravastatin runs out (recently picked up 90ds). PCP sent rx with 3 refills on 02/04/23. - Recommend to check glucose continuously with FL3. Pt has back-up supplies to check BG through finger sticks. - Enrolled in Basaglar, Humalog, Trulicity patient assistance program through [place] - obtained  signature for application renewal today.. Will collaborate with provider, CPhT, and patient to pursue assistance renewal.  Follow Up Plan: Pharmacist in-person 04/14/23, PCP 05/20/23  Nils Pyle, PharmD PGY1 Pharmacy Resident

## 2023-03-16 DIAGNOSIS — M9905 Segmental and somatic dysfunction of pelvic region: Secondary | ICD-10-CM | POA: Diagnosis not present

## 2023-03-16 DIAGNOSIS — M9903 Segmental and somatic dysfunction of lumbar region: Secondary | ICD-10-CM | POA: Diagnosis not present

## 2023-03-16 DIAGNOSIS — M62830 Muscle spasm of back: Secondary | ICD-10-CM | POA: Diagnosis not present

## 2023-03-16 DIAGNOSIS — M9904 Segmental and somatic dysfunction of sacral region: Secondary | ICD-10-CM | POA: Diagnosis not present

## 2023-03-17 ENCOUNTER — Telehealth: Payer: Self-pay | Admitting: Pharmacist

## 2023-03-17 NOTE — Telephone Encounter (Signed)
Can you prepare and send me PCP portion--basaglar, humalog and trulicity I have patient's portion signed here and will send to you  Thank you!

## 2023-03-24 NOTE — Telephone Encounter (Signed)
I rec'd the first 2 patient pages in email, however pgs 6-7 (the pt signed pgs) were missing. Did he get to sign these?  Provider portions were emailed to you 03/23/23 :)

## 2023-03-30 DIAGNOSIS — M9903 Segmental and somatic dysfunction of lumbar region: Secondary | ICD-10-CM | POA: Diagnosis not present

## 2023-03-30 DIAGNOSIS — M9905 Segmental and somatic dysfunction of pelvic region: Secondary | ICD-10-CM | POA: Diagnosis not present

## 2023-03-30 DIAGNOSIS — M62830 Muscle spasm of back: Secondary | ICD-10-CM | POA: Diagnosis not present

## 2023-03-30 DIAGNOSIS — M9904 Segmental and somatic dysfunction of sacral region: Secondary | ICD-10-CM | POA: Diagnosis not present

## 2023-03-31 NOTE — Telephone Encounter (Signed)
Rec'd completed pcp pages, along with first 2 patient pages. Awaiting pages 6-7 of patient pages.

## 2023-04-06 DIAGNOSIS — M62830 Muscle spasm of back: Secondary | ICD-10-CM | POA: Diagnosis not present

## 2023-04-06 DIAGNOSIS — M9904 Segmental and somatic dysfunction of sacral region: Secondary | ICD-10-CM | POA: Diagnosis not present

## 2023-04-06 DIAGNOSIS — M9905 Segmental and somatic dysfunction of pelvic region: Secondary | ICD-10-CM | POA: Diagnosis not present

## 2023-04-06 DIAGNOSIS — M9903 Segmental and somatic dysfunction of lumbar region: Secondary | ICD-10-CM | POA: Diagnosis not present

## 2023-04-09 DIAGNOSIS — B351 Tinea unguium: Secondary | ICD-10-CM | POA: Diagnosis not present

## 2023-04-09 DIAGNOSIS — L84 Corns and callosities: Secondary | ICD-10-CM | POA: Diagnosis not present

## 2023-04-09 DIAGNOSIS — M79676 Pain in unspecified toe(s): Secondary | ICD-10-CM | POA: Diagnosis not present

## 2023-04-09 DIAGNOSIS — E1142 Type 2 diabetes mellitus with diabetic polyneuropathy: Secondary | ICD-10-CM | POA: Diagnosis not present

## 2023-04-13 DIAGNOSIS — M9904 Segmental and somatic dysfunction of sacral region: Secondary | ICD-10-CM | POA: Diagnosis not present

## 2023-04-13 DIAGNOSIS — M9903 Segmental and somatic dysfunction of lumbar region: Secondary | ICD-10-CM | POA: Diagnosis not present

## 2023-04-13 DIAGNOSIS — M9905 Segmental and somatic dysfunction of pelvic region: Secondary | ICD-10-CM | POA: Diagnosis not present

## 2023-04-13 DIAGNOSIS — M62830 Muscle spasm of back: Secondary | ICD-10-CM | POA: Diagnosis not present

## 2023-04-14 ENCOUNTER — Ambulatory Visit (INDEPENDENT_AMBULATORY_CARE_PROVIDER_SITE_OTHER): Payer: No Typology Code available for payment source | Admitting: Pharmacist

## 2023-04-14 DIAGNOSIS — Z794 Long term (current) use of insulin: Secondary | ICD-10-CM

## 2023-04-14 DIAGNOSIS — E1169 Type 2 diabetes mellitus with other specified complication: Secondary | ICD-10-CM | POA: Diagnosis not present

## 2023-04-14 NOTE — Progress Notes (Signed)
04/14/2023 Name: Randy [name] MRN: [id number] DOB: [birth year]  Chief Complaint  Patient presents with   Diabetes    Randy [name] is a 72 y.o. year old male who was referred for medication management by their primary care provider, Dettinger, Elige Radon, MD. They presented for a face to face visit today.   They were referred to the pharmacist by their PCP for assistance in managing diabetes   Subjective: Pt presents today without his FL3 reader as requested at previous appt. Does not have BG meter with him either.   Denies having complaints or concerns with his current medication regimen.   Care Team: Primary Care Provider: Dettinger, Elige Radon, MD ; Next Scheduled Visit: 05/20/23  Medication Access/Adherence  Current Pharmacy:  [facility] - [facility] [place], [place] - [address] [place] [address] Phone: [phone] Fax: [phone]  [facility] [address] [place] - [address] [place] [address] [place] [address] Phone: [phone] Fax: [phone]  [facility] - [place], [place] - [address] [place] [address] Phone: [phone] Fax: [phone]  [facility] [address] [place] [address] Phone: [phone] Fax: [phone]   Patient reports affordability concerns with their medications: No  Patient reports access/transportation concerns to their pharmacy: No  Patient reports adherence concerns with their medications:  No    Diabetes:  Current medications: Basaglar (insulin glargine) 75 units daily, Humalog (insulin lispro) 15 units with meals, Trulicity (dapagliflozin) 4.5 mg weekly. Medications tried in the past: [place] (dapagliflozin)- frequent urination- not willing to retrial.  Notices that Trulicity needle is usually bent when he removes it. Occasional bruising.   Current glucose readings: not checking  post-prandially FBG: 88, 99, 106, 111, 112, 107, 109  Checked POC glucose at 2:30PM (last meal at 11:3AM: chicken, pinto beans, cornbread): 296 mg/dL  Has FL3 sensor and reader at home. Reader stopped working after he went through [employer] with sensor. He did call [employer] to request a new reader and they sent him replacement sensors. At last appt, pt stated that he would try another sensor - but today he reports that he was not going to do this.   Patient denies hypoglycemic s/sx including dizziness, shakiness, sweating. Patient denies hyperglycemic symptoms including polyuria, polydipsia, polyphagia, nocturia, neuropathy, blurred vision.  Current meal patterns: 3 meals/day - usually eating at restaurants. Likes green beans, pinto beans, corn, cabbage, cornbread. - Drinks diet mountain dew, one bottle water/day.   Physical activity: currently limited by back pain   Objective:  Lab Results  Component Value Date   HGBA1C 8.5 (H) 02/04/2023    Lab Results  Component Value Date   CREATININE 1.36 (H) 11/26/2022   BUN 21 11/26/2022   NA 140 11/26/2022   K 3.7 11/26/2022   CL 95 (L) 11/26/2022   CO2 27 11/26/2022    Lab Results  Component Value Date   CHOL 189 11/04/2022   HDL 50 11/04/2022   LDLCALC 105 (H) 11/04/2022   TRIG 172 (H) 11/04/2022   CHOLHDL 3.8 11/04/2022    Medications Reviewed Today     Reviewed by Particia Lather, RPH (Pharmacist) on 04/14/23 at 1503  Med List Status: <None>   Medication Order Taking? Sig Documenting Provider Last Dose Status Informant  amLODipine (NORVASC) 10 MG tablet [id number] No Take 1 tablet (10 mg total) by mouth daily. Dettinger, Elige Radon,  MD Taking Active   aspirin EC 81 MG tablet [id number] No Take 81 mg by mouth daily. [provider] Taking Active Self  cilostazol (PLETAL) 100 MG tablet [id number] No Take 100 mg by mouth 2 (two) times daily. [provider] Taking Active   citalopram (CELEXA) 40 MG tablet  [id number] No Take 1 tablet (40 mg total) by mouth daily. Dettinger, Elige Radon, MD Taking Active   citalopram (CELEXA) 40 MG tablet [id number] No TAKE ONE (1) TABLET BY MOUTH EVERY DAY Dettinger, Elige Radon, MD Taking Active   Dulaglutide (TRULICITY) 4.5 MG/0.5ML SOPN [id number] No Inject 4.5 mg as directed once a week. DX: E11.65 Dettinger, Elige Radon, MD Taking Active   glucose blood (ACCU-CHEK GUIDE) test strip [id number] No Use as instructed to test blood sugar daily. DX: E11.65 (patient using guide meter now) Dettinger, Elige Radon, MD Taking Active Self  glucose monitoring kit (FREESTYLE) monitoring kit [id number] No 1 each by Does not apply route 2 (two) times daily before a meal. Dettinger, Elige Radon, MD Taking Active Self  hydrochlorothiazide (HYDRODIURIL) 25 MG tablet [id number] No Take 1 tablet (25 mg total) by mouth daily. Dettinger, Elige Radon, MD Taking Active   hydrOXYzine (VISTARIL) 50 MG capsule [id number] No Take 1 capsule (50 mg total) by mouth 3 (three) times daily as needed. Dettinger, Elige Radon, MD Not Taking Active   Insulin Glargine [facility] KWIKPEN) 100 UNIT/ML [id number] No Inject 70 Units into the skin daily. Dettinger, Elige Radon, MD Taking Active   insulin lispro (HUMALOG KWIKPEN) 100 UNIT/ML KwikPen [id number] No Inject 10 Units into the skin 3 (three) times daily with meals. Dettinger, Elige Radon, MD Taking Active   Insulin Pen Needle (GNP ULTICARE PEN NEEDLES) 32G X 6 MM MISC [id number] No Use 3 times daily DX: E11.65 Dettinger, Elige Radon, MD Taking Active Self  lidocaine (XYLOCAINE) 5 % ointment [id number] No Apply 1 application topically 3 (three) times daily as needed. Dettinger, Elige Radon, MD Taking Active Self  losartan (COZAAR) 100 MG tablet [id number] No TAKE ONE (1) TABLET BY MOUTH EVERY DAY Dettinger, Elige Radon, MD Taking Active   Potassium Chloride ER 20 MEQ TBCR [id number] No Take 20 mEq by mouth daily.  [provider] Taking Active Self  rosuvastatin (CRESTOR) 40 MG tablet  [id number] No Take 1 tablet (40 mg total) by mouth daily.  Patient not taking: Reported on 03/10/2023   Dettinger, Elige Radon, MD Not Taking Active            Med Note Particia Lather   Tue Mar 10, 2023  3:12 PM) Taking pravastatin per dispense report  ticagrelor (BRILINTA) 90 MG TABS tablet [id number] No Take 1 tablet (90 mg total) by mouth 2 (two) times daily. Danford, Earl Lites, MD Taking Active             Assessment/Plan:   Diabetes: - Currently uncontrolled with most recent A1c of 8.5% above goal <7%, though improved from 9.3%. Pt does not have FL3 sensor/reader with him today or OneTouch Verio BG meter to review home readings, however 3 hr PPG elevated to 296 despite taking 15 units of insulin with lunch. Pt at high risk of ACS event given stroke this year. No evidence of hypoglycemia. Pt agreeable to increase prandial insulin by 3 units at his largest meals (lunch and dinner), ~20% increase. A1c due at PCP f/u in Dec. Noted at previous appt that pt has restarted moderate-intensity statin, after being discharged with rosuvastatin 40 mg after hospitalization  for CVA in 2024. Will follow-up A1c and resuming high-intensity statin at PCP f/u.  - Reviewed long term cardiovascular and renal outcomes of uncontrolled blood sugar - Reviewed goal A1c, goal fasting, and goal 2 hour post prandial glucose - Recommend to INCREASE insulin lispro (Humalog) to 15 units with breakfast and 18 units with lunch and dinner - Recommend to continue insulin glargine (Basaglar) 75 units daily - Recommend to continue Trulicity (dulaglutide) 4.5 mg weekly  - Pt due for A1c and UACR at PCP f/u - Instructed patient to switch to high-intensity statin when current supply of pravastatin runs out (recently picked up 90ds). PCP sent rx with 3 refills on 02/04/23. - Recommend to check glucose fasting daily and post-prandial as he is willing. Pt would ideally resume CGM with [facility], however he reports that he does not  take it with him when he leaves the house due to alarms. Pt also has not brought in his reader to clinic when requested. Will reevaluate how to obtain replacement reader next month.  - Enrolled in Basaglar, Humalog, Trulicity patient assistance program through [place] - obtained signature for application renewal and faxed to [employer]. Will collaborate with provider, CPhT, and patient to pursue assistance renewal.  Follow Up Plan: PCP 05/20/23  Nils Pyle, PharmD PGY1 Pharmacy Resident  Kieth Brightly, PharmD, BCACP, CPP Clinical Pharmacist, [facility]

## 2023-04-20 DIAGNOSIS — M9903 Segmental and somatic dysfunction of lumbar region: Secondary | ICD-10-CM | POA: Diagnosis not present

## 2023-04-20 DIAGNOSIS — M62830 Muscle spasm of back: Secondary | ICD-10-CM | POA: Diagnosis not present

## 2023-04-20 DIAGNOSIS — M9905 Segmental and somatic dysfunction of pelvic region: Secondary | ICD-10-CM | POA: Diagnosis not present

## 2023-04-20 DIAGNOSIS — M9904 Segmental and somatic dysfunction of sacral region: Secondary | ICD-10-CM | POA: Diagnosis not present

## 2023-04-21 NOTE — Progress Notes (Deleted)
Pharmacy Medication Assistance Program Note    04/21/2023  Patient ID: KAMERON BRACK, male  DOB: 07/30/1950, 72 y.o.  MRN:  [id number]     04/21/2023  Outreach Medication Three  Manufacturer Medication Three [employer] Trulicity  Dose of Trulicity 4.5MG   Type of Sport and exercise psychologist  Name of Prescriber JOSHUA DETTINGER  Date Application Submitted to Manufacturer 04/21/2023  Method Application Sent to Manufacturer Fax         04/21/2023  Outreach Medication One  Manufacturer Medication One [employer] Basaglar  Type of Radiographer, therapeutic Assistance  Name of Prescriber JOSHUA DETTINGER  Date Application Submitted to Manufacturer 04/21/2023  Method Application Sent to Manufacturer Fax         04/21/2023  Outreach Medication Two  Manufacturer Medication Two [employer] Humalog  Type of Radiographer, therapeutic Assistance  Name of Prescriber JOSHUA DETTINGER  Method Application Sent to Manufacturer Fax  Date Application Submitted to Manufacturer 04/21/2023        04/21/2023  Patient ID: Sara Chu, male  DOB: [birth year], 72 y.o.  MRN:  [id number]

## 2023-04-27 DIAGNOSIS — M9905 Segmental and somatic dysfunction of pelvic region: Secondary | ICD-10-CM | POA: Diagnosis not present

## 2023-04-27 DIAGNOSIS — M62830 Muscle spasm of back: Secondary | ICD-10-CM | POA: Diagnosis not present

## 2023-04-27 DIAGNOSIS — M9904 Segmental and somatic dysfunction of sacral region: Secondary | ICD-10-CM | POA: Diagnosis not present

## 2023-04-27 DIAGNOSIS — M9903 Segmental and somatic dysfunction of lumbar region: Secondary | ICD-10-CM | POA: Diagnosis not present

## 2023-05-04 DIAGNOSIS — M62830 Muscle spasm of back: Secondary | ICD-10-CM | POA: Diagnosis not present

## 2023-05-04 DIAGNOSIS — M9903 Segmental and somatic dysfunction of lumbar region: Secondary | ICD-10-CM | POA: Diagnosis not present

## 2023-05-04 DIAGNOSIS — M9905 Segmental and somatic dysfunction of pelvic region: Secondary | ICD-10-CM | POA: Diagnosis not present

## 2023-05-04 DIAGNOSIS — M9904 Segmental and somatic dysfunction of sacral region: Secondary | ICD-10-CM | POA: Diagnosis not present

## 2023-05-04 NOTE — Progress Notes (Signed)
Pharmacy Medication Assistance Program Note    05/04/2023  Patient ID: Randy [name], male   DOB: 07/19/1950, 72 y.o.   MRN: [id number]     04/21/2023 05/04/2023  Outreach Medication One  Manufacturer Medication One [employer] Basaglar   Type of Radiographer, therapeutic Assistance   Name of Prescriber JOSHUA DETTINGER   Date Application Submitted to Manufacturer 04/21/2023   Method Application Sent to Manufacturer Fax   Patient Assistance Determination  Approved  Approval Start Date  06/10/2023  Approval End Date  06/08/2024           04/21/2023 05/04/2023  Outreach Medication Two  Manufacturer Medication Two [employer] Humalog   Type of Radiographer, therapeutic Assistance   Name of Prescriber JOSHUA DETTINGER   Method Application Sent to Child psychotherapist Fax   Date Application Submitted to Manufacturer 04/21/2023   Patient Assistance Determination  Approved  Approval Start Date  06/10/2023           04/21/2023  Outreach Medication Three  Manufacturer Medication Three [employer] Trulicity  Dose of Trulicity 4.5MG   Type of Sport and exercise psychologist  Name of Prescriber JOSHUA DETTINGER  Date Application Submitted to Manufacturer 04/21/2023  Method Application Sent to [employer]

## 2023-05-06 NOTE — Progress Notes (Signed)
Pharmacy Medication Assistance Program Note    05/06/2023  Patient ID: Randy [name], male   DOB: 06/07/1951, 72 y.o.   MRN: [id number]     04/21/2023 05/04/2023  Outreach Medication One  Manufacturer Medication One [employer] Basaglar   Type of Radiographer, therapeutic Assistance   Name of Prescriber JOSHUA DETTINGER   Date Application Submitted to Manufacturer 04/21/2023   Method Application Sent to Manufacturer Fax   Patient Assistance Determination  Approved  Approval Start Date  06/10/2023  Approval End Date  06/08/2024           04/21/2023 05/04/2023  Outreach Medication Two  Manufacturer Medication Two [employer] Humalog   Type of Radiographer, therapeutic Assistance   Name of Prescriber JOSHUA DETTINGER   Method Application Sent to Child psychotherapist Fax   Date Application Submitted to Manufacturer 04/21/2023   Patient Assistance Determination  Approved  Approval Start Date  06/10/2023           04/21/2023  Outreach Medication Three  Manufacturer Medication Three [employer] Trulicity  Dose of Trulicity 4.5MG   Type of Radiographer, therapeutic Assistance  Name of Prescriber JOSHUA DETTINGER  Date Application Submitted to Manufacturer 04/21/2023  Method Application Sent to Manufacturer Fax  Patient Assistance Determination Approved  Approval Start Date 06/10/2023  Approval End Date 06/08/2024

## 2023-05-06 NOTE — Telephone Encounter (Signed)
Update, enrollment actually was pending approval as of 05/06/23.  Address on RX pages of application was wrong. Verified correct addr with [employer] rep. Will be reviewed in 3-5 business days.

## 2023-05-11 DIAGNOSIS — M9903 Segmental and somatic dysfunction of lumbar region: Secondary | ICD-10-CM | POA: Diagnosis not present

## 2023-05-11 DIAGNOSIS — M9904 Segmental and somatic dysfunction of sacral region: Secondary | ICD-10-CM | POA: Diagnosis not present

## 2023-05-11 DIAGNOSIS — M9905 Segmental and somatic dysfunction of pelvic region: Secondary | ICD-10-CM | POA: Diagnosis not present

## 2023-05-11 DIAGNOSIS — M62830 Muscle spasm of back: Secondary | ICD-10-CM | POA: Diagnosis not present

## 2023-05-18 DIAGNOSIS — M62830 Muscle spasm of back: Secondary | ICD-10-CM | POA: Diagnosis not present

## 2023-05-18 DIAGNOSIS — M9905 Segmental and somatic dysfunction of pelvic region: Secondary | ICD-10-CM | POA: Diagnosis not present

## 2023-05-18 DIAGNOSIS — M9903 Segmental and somatic dysfunction of lumbar region: Secondary | ICD-10-CM | POA: Diagnosis not present

## 2023-05-18 DIAGNOSIS — M9904 Segmental and somatic dysfunction of sacral region: Secondary | ICD-10-CM | POA: Diagnosis not present

## 2023-05-20 ENCOUNTER — Encounter: Payer: Self-pay | Admitting: Family Medicine

## 2023-05-20 ENCOUNTER — Ambulatory Visit: Payer: No Typology Code available for payment source | Admitting: Family Medicine

## 2023-05-20 VITALS — BP 134/72 | HR 88 | Wt 189.0 lb

## 2023-05-20 DIAGNOSIS — E1169 Type 2 diabetes mellitus with other specified complication: Secondary | ICD-10-CM | POA: Diagnosis not present

## 2023-05-20 DIAGNOSIS — Z23 Encounter for immunization: Secondary | ICD-10-CM | POA: Diagnosis not present

## 2023-05-20 DIAGNOSIS — E782 Mixed hyperlipidemia: Secondary | ICD-10-CM

## 2023-05-20 DIAGNOSIS — F339 Major depressive disorder, recurrent, unspecified: Secondary | ICD-10-CM

## 2023-05-20 DIAGNOSIS — Z125 Encounter for screening for malignant neoplasm of prostate: Secondary | ICD-10-CM | POA: Diagnosis not present

## 2023-05-20 DIAGNOSIS — Z794 Long term (current) use of insulin: Secondary | ICD-10-CM | POA: Diagnosis not present

## 2023-05-20 DIAGNOSIS — I10 Essential (primary) hypertension: Secondary | ICD-10-CM | POA: Diagnosis not present

## 2023-05-20 DIAGNOSIS — F411 Generalized anxiety disorder: Secondary | ICD-10-CM | POA: Diagnosis not present

## 2023-05-20 LAB — BAYER DCA HB A1C WAIVED: HB A1C (BAYER DCA - WAIVED): 7.5 % — ABNORMAL HIGH (ref 4.8–5.6)

## 2023-05-20 MED ORDER — LOSARTAN POTASSIUM 100 MG PO TABS: 100.0000 mg | ORAL_TABLET | Freq: Every day | ORAL | 1 refills | Status: DC

## 2023-05-20 NOTE — Progress Notes (Signed)
BP 134/72   Pulse 88   Wt 189 lb (85.7 kg)   SpO2 96%   BMI 27.91 kg/m    Subjective:   Patient ID: Randy [name], male    DOB: Mar 18, 1951, 72 y.o.   MRN: [id number]  HPI: Randy [name] is a 72 y.o. male presenting on 05/20/2023 for Medical Management of Chronic Issues, Diabetes, Anxiety, Depression, and H/O CVA   HPI Type 2 diabetes mellitus Patient comes in today for recheck of his diabetes. Patient has been currently taking Basaglar 75 daily and Humalog 20 units 3 times daily and Trulicity 4.5. Patient is currently on an ACE inhibitor/ARB. Patient has not seen an ophthalmologist this year. Patient denies any new issues with their feet. The symptom started onset as an adult hypertension and hyperlipidemia and atherosclerosis and history of CVA ARE RELATED TO DM   Hypertension Patient is currently on amlodipine and hydrochlorothiazide and losartan, and their blood pressure today is 134/72. Patient denies any lightheadedness or dizziness. Patient denies headaches, blurred vision, chest pains, shortness of breath, or weakness. Denies any side effects from medication and is content with current medication.   Hyperlipidemia Patient is coming in for recheck of his hyperlipidemia. The patient is currently taking Crestor. They deny any issues with myalgias or history of liver damage from it. They deny any focal numbness or weakness or chest pain.   Anxiety and depression recheck Patient is coming in today for anxiety and depression recheck.  Patient is currently taking citalopram every day.  Patient feels like his depression and anxiety is going well.  Denies any major issue and will continue forward with current medicine.    05/20/2023    8:15 AM 05/20/2023    8:14 AM 02/04/2023   10:12 AM 02/04/2023   10:11 AM 11/17/2022    1:56 PM  Depression screen PHQ 2/9  Decreased Interest  0  0 0  Down, Depressed, Hopeless  0  0 0  PHQ - 2 Score  0  0 0  Altered sleeping   0  0  Tired,  decreased energy 0  0  0  Change in appetite 0  0  0  Feeling bad or failure about yourself  0  0  0  Trouble concentrating 0  0  0  Moving slowly or fidgety/restless 0  0  0  Suicidal thoughts 0  0  0  PHQ-9 Score     0  Difficult doing work/chores Not difficult at all  Not difficult at all  Not difficult at all     Relevant past medical, surgical, family and social history reviewed and updated as indicated. Interim medical history since our last visit reviewed. Allergies and medications reviewed and updated.  Review of Systems  Constitutional:  Negative for chills and fever.  Eyes:  Negative for visual disturbance.  Respiratory:  Negative for shortness of breath and wheezing.   Cardiovascular:  Negative for chest pain and leg swelling.  Musculoskeletal:  Negative for back pain and gait problem.  Skin:  Negative for rash.  Neurological:  Negative for dizziness, weakness and light-headedness.  All other systems reviewed and are negative.   Per HPI unless specifically indicated above   Allergies as of 05/20/2023   No Known Allergies      Medication List        Accurate as of May 20, 2023  8:30 AM. If you have any questions, ask your nurse or doctor.  STOP taking these medications    Brilinta 90 MG Tabs tablet Generic drug: ticagrelor Stopped by: Elige Radon Etola Mull       TAKE these medications    Accu-Chek Guide test strip Generic drug: glucose blood Use as instructed to test blood sugar daily. DX: E11.65 (patient using guide meter now)   amLODipine 10 MG tablet Commonly known as: NORVASC Take 1 tablet (10 mg total) by mouth daily.   aspirin EC 81 MG tablet Take 81 mg by mouth daily.   Basaglar KwikPen 100 UNIT/ML Inject 70 Units into the skin daily.   cilostazol 100 MG tablet Commonly known as: PLETAL Take 100 mg by mouth 2 (two) times daily.   citalopram 40 MG tablet Commonly known as: CELEXA Take 1 tablet (40 mg total) by mouth  daily. What changed: Another medication with the same name was removed. Continue taking this medication, and follow the directions you see here. Changed by: Elige Radon Shariya Gaster   glucose monitoring kit monitoring kit 1 each by Does not apply route 2 (two) times daily before a meal.   GNP UltiCare Pen Needles 32G X 6 MM Misc Generic drug: Insulin Pen Needle Use 3 times daily DX: E11.65   hydrochlorothiazide 25 MG tablet Commonly known as: HYDRODIURIL Take 1 tablet (25 mg total) by mouth daily.   hydrOXYzine 50 MG capsule Commonly known as: VISTARIL Take 1 capsule (50 mg total) by mouth 3 (three) times daily as needed.   insulin lispro 100 UNIT/ML KwikPen Commonly known as: HumaLOG KwikPen Inject 10 Units into the skin 3 (three) times daily with meals.   lidocaine 5 % ointment Commonly known as: XYLOCAINE Apply 1 application topically 3 (three) times daily as needed.   losartan 100 MG tablet Commonly known as: COZAAR Take 1 tablet (100 mg total) by mouth daily. What changed: how much to take Changed by: Elige Radon Jovie Swanner   Potassium Chloride ER 20 MEQ Tbcr Take 20 mEq by mouth daily.   rosuvastatin 40 MG tablet Commonly known as: CRESTOR Take 1 tablet (40 mg total) by mouth daily.   Trulicity 4.5 MG/0.5ML Soaj Generic drug: Dulaglutide Inject 4.5 mg as directed once a week. DX: E11.65         Objective:   BP 134/72   Pulse 88   Wt 189 lb (85.7 kg)   SpO2 96%   BMI 27.91 kg/m   Wt Readings from Last 3 Encounters:  05/20/23 189 lb (85.7 kg)  03/04/23 185 lb (83.9 kg)  02/04/23 183 lb (83 kg)    Physical Exam Vitals and nursing note reviewed.  Constitutional:      General: He is not in acute distress.    Appearance: He is well-developed. He is not diaphoretic.  Eyes:     General: No scleral icterus.    Conjunctiva/sclera: Conjunctivae normal.  Neck:     Thyroid: No thyromegaly.  Cardiovascular:     Rate and Rhythm: Normal rate and regular rhythm.      Heart sounds: Normal heart sounds. No murmur heard. Pulmonary:     Effort: Pulmonary effort is normal. No respiratory distress.     Breath sounds: Normal breath sounds. No wheezing.  Musculoskeletal:        General: No swelling. Normal range of motion.     Cervical back: Neck supple.  Lymphadenopathy:     Cervical: No cervical adenopathy.  Skin:    General: Skin is warm and dry.     Findings: No rash.  Neurological:     Mental Status: He is alert and oriented to person, place, and time.     Coordination: Coordination normal.  Psychiatric:        Behavior: Behavior normal.       Assessment & Plan:   Problem List Items Addressed This Visit       Cardiovascular and Mediastinum   Essential hypertension, benign   Relevant Medications   losartan (COZAAR) 100 MG tablet     Endocrine   Type 2 diabetes mellitus with other specified complication (HCC) - Primary   Relevant Medications   losartan (COZAAR) 100 MG tablet   Other Relevant Orders   CBC with Differential/Platelet   CMP14+EGFR   Lipid panel   Bayer DCA Hb A1c Waived   PSA, total and free     Other   Mixed hyperlipidemia   Relevant Medications   losartan (COZAAR) 100 MG tablet   Other Relevant Orders   CBC with Differential/Platelet   CMP14+EGFR   Lipid panel   Bayer DCA Hb A1c Waived   PSA, total and free   Depression, recurrent (HCC)   GAD (generalized anxiety disorder)    A1c is 7.5 which is improved.  Continue with current medicine and focus on diet.  Blood pressure and everything else looks good. Follow up plan: Return in about 3 months (around 08/18/2023), or if symptoms worsen or fail to improve, for Diabetes recheck.  Counseling provided for all of the vaccine components Orders Placed This Encounter  Procedures   CBC with Differential/Platelet   CMP14+EGFR   Lipid panel   Bayer DCA Hb A1c Waived   PSA, total and free    Arville Care, MD [facility] 05/20/2023,  8:30 AM

## 2023-05-21 LAB — LIPID PANEL
Chol/HDL Ratio: 3.4 {ratio} (ref 0.0–5.0)
Cholesterol, Total: 149 mg/dL (ref 100–199)
HDL: 44 mg/dL (ref >39)
LDL Chol Calc (NIH): 83 mg/dL (ref 0–99)
Triglycerides: 123 mg/dL (ref 0–149)
VLDL Cholesterol Cal: 22 mg/dL (ref 5–40)

## 2023-05-21 LAB — CBC WITH DIFFERENTIAL/PLATELET
Basophils Absolute: 0 10*3/uL (ref 0.0–0.2)
Basos: 1 %
EOS (ABSOLUTE): 0.1 10*3/uL (ref 0.0–0.4)
Eos: 2 %
Hematocrit: 42.8 % (ref 37.5–51.0)
Hemoglobin: 14.8 g/dL (ref 13.0–17.7)
Immature Grans (Abs): 0 10*3/uL (ref 0.0–0.1)
Immature Granulocytes: 1 %
Lymphocytes Absolute: 1.6 10*3/uL (ref 0.7–3.1)
Lymphs: 26 %
MCH: 33 pg (ref 26.6–33.0)
MCHC: 34.6 g/dL (ref 31.5–35.7)
MCV: 95 fL (ref 79–97)
Monocytes Absolute: 0.5 10*3/uL (ref 0.1–0.9)
Monocytes: 8 %
Neutrophils Absolute: 4 10*3/uL (ref 1.4–7.0)
Neutrophils: 62 %
Platelets: 191 10*3/uL (ref 150–450)
RBC: 4.49 x10E6/uL (ref 4.14–5.80)
RDW: 13.2 % (ref 11.6–15.4)
WBC: 6.3 10*3/uL (ref 3.4–10.8)

## 2023-05-21 LAB — CMP14+EGFR
ALT: 13 [IU]/L (ref 0–44)
AST: 15 [IU]/L (ref 0–40)
Albumin: 4.4 g/dL (ref 3.8–4.8)
Alkaline Phosphatase: 89 [IU]/L (ref 44–121)
BUN/Creatinine Ratio: 16 (ref 10–24)
BUN: 18 mg/dL (ref 8–27)
Bilirubin Total: 0.4 mg/dL (ref 0.0–1.2)
CO2: 26 mmol/L (ref 20–29)
Calcium: 9.3 mg/dL (ref 8.6–10.2)
Chloride: 97 mmol/L (ref 96–106)
Creatinine, Ser: 1.16 mg/dL (ref 0.76–1.27)
Globulin, Total: 2.1 g/dL (ref 1.5–4.5)
Glucose: 93 mg/dL (ref 70–99)
Potassium: 3.8 mmol/L (ref 3.5–5.2)
Sodium: 140 mmol/L (ref 134–144)
Total Protein: 6.5 g/dL (ref 6.0–8.5)
eGFR: 67 mL/min/{1.73_m2} (ref >59)

## 2023-05-21 LAB — PSA, TOTAL AND FREE
PSA, Free Pct: 46.7 %
PSA, Free: 0.42 ng/mL
Prostate Specific Ag, Serum: 0.9 ng/mL (ref 0.0–4.0)

## 2023-05-25 DIAGNOSIS — Z008 Encounter for other general examination: Secondary | ICD-10-CM | POA: Diagnosis not present

## 2023-05-25 DIAGNOSIS — Z794 Long term (current) use of insulin: Secondary | ICD-10-CM | POA: Diagnosis not present

## 2023-05-25 DIAGNOSIS — M9903 Segmental and somatic dysfunction of lumbar region: Secondary | ICD-10-CM | POA: Diagnosis not present

## 2023-05-25 DIAGNOSIS — E1169 Type 2 diabetes mellitus with other specified complication: Secondary | ICD-10-CM | POA: Diagnosis not present

## 2023-05-25 DIAGNOSIS — M9904 Segmental and somatic dysfunction of sacral region: Secondary | ICD-10-CM | POA: Diagnosis not present

## 2023-05-25 DIAGNOSIS — M62830 Muscle spasm of back: Secondary | ICD-10-CM | POA: Diagnosis not present

## 2023-05-25 DIAGNOSIS — M9905 Segmental and somatic dysfunction of pelvic region: Secondary | ICD-10-CM | POA: Diagnosis not present

## 2023-05-25 DIAGNOSIS — E785 Hyperlipidemia, unspecified: Secondary | ICD-10-CM | POA: Diagnosis not present

## 2023-06-01 DIAGNOSIS — M62830 Muscle spasm of back: Secondary | ICD-10-CM | POA: Diagnosis not present

## 2023-06-01 DIAGNOSIS — M9905 Segmental and somatic dysfunction of pelvic region: Secondary | ICD-10-CM | POA: Diagnosis not present

## 2023-06-01 DIAGNOSIS — M9903 Segmental and somatic dysfunction of lumbar region: Secondary | ICD-10-CM | POA: Diagnosis not present

## 2023-06-01 DIAGNOSIS — M9904 Segmental and somatic dysfunction of sacral region: Secondary | ICD-10-CM | POA: Diagnosis not present

## 2023-06-11 DIAGNOSIS — M62830 Muscle spasm of back: Secondary | ICD-10-CM | POA: Diagnosis not present

## 2023-06-11 DIAGNOSIS — M9903 Segmental and somatic dysfunction of lumbar region: Secondary | ICD-10-CM | POA: Diagnosis not present

## 2023-06-11 DIAGNOSIS — M9905 Segmental and somatic dysfunction of pelvic region: Secondary | ICD-10-CM | POA: Diagnosis not present

## 2023-06-11 DIAGNOSIS — M9904 Segmental and somatic dysfunction of sacral region: Secondary | ICD-10-CM | POA: Diagnosis not present

## 2023-06-18 DIAGNOSIS — M79676 Pain in unspecified toe(s): Secondary | ICD-10-CM | POA: Diagnosis not present

## 2023-06-18 DIAGNOSIS — B351 Tinea unguium: Secondary | ICD-10-CM | POA: Diagnosis not present

## 2023-06-18 DIAGNOSIS — L84 Corns and callosities: Secondary | ICD-10-CM | POA: Diagnosis not present

## 2023-06-18 DIAGNOSIS — E1142 Type 2 diabetes mellitus with diabetic polyneuropathy: Secondary | ICD-10-CM | POA: Diagnosis not present

## 2023-06-22 DIAGNOSIS — M9903 Segmental and somatic dysfunction of lumbar region: Secondary | ICD-10-CM | POA: Diagnosis not present

## 2023-06-22 DIAGNOSIS — M9904 Segmental and somatic dysfunction of sacral region: Secondary | ICD-10-CM | POA: Diagnosis not present

## 2023-06-22 DIAGNOSIS — M9905 Segmental and somatic dysfunction of pelvic region: Secondary | ICD-10-CM | POA: Diagnosis not present

## 2023-06-22 DIAGNOSIS — M62830 Muscle spasm of back: Secondary | ICD-10-CM | POA: Diagnosis not present

## 2023-07-06 DIAGNOSIS — M62830 Muscle spasm of back: Secondary | ICD-10-CM | POA: Diagnosis not present

## 2023-07-06 DIAGNOSIS — M9904 Segmental and somatic dysfunction of sacral region: Secondary | ICD-10-CM | POA: Diagnosis not present

## 2023-07-06 DIAGNOSIS — M9905 Segmental and somatic dysfunction of pelvic region: Secondary | ICD-10-CM | POA: Diagnosis not present

## 2023-07-06 DIAGNOSIS — M9903 Segmental and somatic dysfunction of lumbar region: Secondary | ICD-10-CM | POA: Diagnosis not present

## 2023-07-20 DIAGNOSIS — M9904 Segmental and somatic dysfunction of sacral region: Secondary | ICD-10-CM | POA: Diagnosis not present

## 2023-07-20 DIAGNOSIS — M62830 Muscle spasm of back: Secondary | ICD-10-CM | POA: Diagnosis not present

## 2023-07-20 DIAGNOSIS — M9903 Segmental and somatic dysfunction of lumbar region: Secondary | ICD-10-CM | POA: Diagnosis not present

## 2023-07-20 DIAGNOSIS — M9905 Segmental and somatic dysfunction of pelvic region: Secondary | ICD-10-CM | POA: Diagnosis not present

## 2023-07-29 ENCOUNTER — Other Ambulatory Visit: Payer: Self-pay | Admitting: Family Medicine

## 2023-07-30 NOTE — Telephone Encounter (Signed)
Last OV 05/20/23. Last RF historical provider. Next OV 08/26/23

## 2023-08-03 DIAGNOSIS — M9905 Segmental and somatic dysfunction of pelvic region: Secondary | ICD-10-CM | POA: Diagnosis not present

## 2023-08-03 DIAGNOSIS — M9904 Segmental and somatic dysfunction of sacral region: Secondary | ICD-10-CM | POA: Diagnosis not present

## 2023-08-03 DIAGNOSIS — M9903 Segmental and somatic dysfunction of lumbar region: Secondary | ICD-10-CM | POA: Diagnosis not present

## 2023-08-03 DIAGNOSIS — M62830 Muscle spasm of back: Secondary | ICD-10-CM | POA: Diagnosis not present

## 2023-08-17 DIAGNOSIS — M9903 Segmental and somatic dysfunction of lumbar region: Secondary | ICD-10-CM | POA: Diagnosis not present

## 2023-08-17 DIAGNOSIS — M9904 Segmental and somatic dysfunction of sacral region: Secondary | ICD-10-CM | POA: Diagnosis not present

## 2023-08-17 DIAGNOSIS — M9905 Segmental and somatic dysfunction of pelvic region: Secondary | ICD-10-CM | POA: Diagnosis not present

## 2023-08-17 DIAGNOSIS — M62830 Muscle spasm of back: Secondary | ICD-10-CM | POA: Diagnosis not present

## 2023-08-26 ENCOUNTER — Ambulatory Visit: Payer: No Typology Code available for payment source | Admitting: Family Medicine

## 2023-08-26 ENCOUNTER — Encounter: Payer: Self-pay | Admitting: Family Medicine

## 2023-08-26 VITALS — BP 138/71 | HR 80 | Ht 69.0 in | Wt 192.0 lb

## 2023-08-26 DIAGNOSIS — Z794 Long term (current) use of insulin: Secondary | ICD-10-CM

## 2023-08-26 DIAGNOSIS — F339 Major depressive disorder, recurrent, unspecified: Secondary | ICD-10-CM

## 2023-08-26 DIAGNOSIS — F411 Generalized anxiety disorder: Secondary | ICD-10-CM | POA: Diagnosis not present

## 2023-08-26 DIAGNOSIS — E1169 Type 2 diabetes mellitus with other specified complication: Secondary | ICD-10-CM

## 2023-08-26 DIAGNOSIS — I10 Essential (primary) hypertension: Secondary | ICD-10-CM

## 2023-08-26 DIAGNOSIS — Z23 Encounter for immunization: Secondary | ICD-10-CM | POA: Diagnosis not present

## 2023-08-26 DIAGNOSIS — E782 Mixed hyperlipidemia: Secondary | ICD-10-CM | POA: Diagnosis not present

## 2023-08-26 LAB — BAYER DCA HB A1C WAIVED: HB A1C (BAYER DCA - WAIVED): 6.1 % — ABNORMAL HIGH (ref 4.8–5.6)

## 2023-08-26 MED ORDER — CITALOPRAM HYDROBROMIDE 40 MG PO TABS: 40.0000 mg | ORAL_TABLET | Freq: Every day | ORAL | 3 refills | Status: AC

## 2023-08-26 MED ORDER — HYDROXYZINE PAMOATE 50 MG PO CAPS: 50.0000 mg | ORAL_CAPSULE | Freq: Three times a day (TID) | ORAL | 1 refills | Status: DC | PRN

## 2023-08-26 NOTE — Progress Notes (Signed)
 BP 138/71   Pulse 80   Ht 5\' 9"  (1.753 m)   Wt 192 lb (87.1 kg)   SpO2 96%   BMI 28.35 kg/m    Subjective:   Patient ID: Randy [name], male    DOB: Nov 02, 1950, 73 y.o.   MRN: [id number]  HPI: Randy [name] is a 73 y.o. male presenting on 08/26/2023 for Medical Management of Chronic Issues, Diabetes, and Depression   HPI Type 2 diabetes mellitus Patient comes in today for recheck of his diabetes. Patient has been currently taking Trulicity and Hospital doctor. Patient is currently on an ACE inhibitor/ARB. Patient has not seen an ophthalmologist this year. Patient denies any new issues with their feet. The symptom started onset as an adult hypertension and hyperlipidemia and history of CVA ARE RELATED TO DM   Hypertension Patient is currently on amlodipine and hydrochlorothiazide and losartan, and their blood pressure today is 138/71. Patient denies any lightheadedness or dizziness. Patient denies headaches, blurred vision, chest pains, shortness of breath, or weakness. Denies any side effects from medication and is content with current medication.   Hyperlipidemia Patient is coming in for recheck of his hyperlipidemia. The patient is currently taking Crestor. They deny any issues with myalgias or history of liver damage from it. They deny any focal numbness or weakness or chest pain.   Depression and anxiety Patient is coming in for depression and anxiety.  He had been on citalopram and hydroxyzine and was doing well but he does not know if he let it lapse but he does not think he is taking it anymore.  He says he is a little anxious.  He denies any major depression.  He denies any suicidal ideations.    08/26/2023    7:57 AM 05/20/2023    8:15 AM 05/20/2023    8:14 AM 02/04/2023   10:12 AM 02/04/2023   10:11 AM  Depression screen PHQ 2/9  Decreased Interest 0  0  0  Down, Depressed, Hopeless 0  0  0  PHQ - 2 Score 0  0  0  Altered sleeping 0   0   Tired, decreased energy 0 0  0    Change in appetite 0 0  0   Feeling bad or failure about yourself  0 0  0   Trouble concentrating 0 0  0   Moving slowly or fidgety/restless 0 0  0   Suicidal thoughts 0 0  0   PHQ-9 Score 0      Difficult doing work/chores Not difficult at all Not difficult at all  Not difficult at all      Relevant past medical, surgical, family and social history reviewed and updated as indicated. Interim medical history since our last visit reviewed. Allergies and medications reviewed and updated.  Review of Systems  Constitutional:  Negative for chills and fever.  Eyes:  Negative for visual disturbance.  Respiratory:  Negative for shortness of breath and wheezing.   Cardiovascular:  Negative for chest pain and leg swelling.  Musculoskeletal:  Negative for back pain and gait problem.  Skin:  Negative for rash.  Neurological:  Negative for dizziness, weakness and light-headedness.  All other systems reviewed and are negative.   Per HPI unless specifically indicated above   Allergies as of 08/26/2023   No Known Allergies      Medication List        Accurate as of August 26, 2023  8:21 AM. If you have any  questions, ask your nurse or doctor.          Accu-Chek Guide test strip Generic drug: glucose blood Use as instructed to test blood sugar daily. DX: E11.65 (patient using guide meter now)   amLODipine 10 MG tablet Commonly known as: NORVASC Take 1 tablet (10 mg total) by mouth daily.   aspirin EC 81 MG tablet Take 81 mg by mouth daily.   Basaglar KwikPen 100 UNIT/ML Inject 70 Units into the skin daily.   cilostazol 100 MG tablet Commonly known as: PLETAL TAKE ONE (1) TABLET BY MOUTH TWO (2) TIMES DAILY   citalopram 40 MG tablet Commonly known as: CELEXA Take 1 tablet (40 mg total) by mouth daily.   glucose monitoring kit monitoring kit 1 each by Does not apply route 2 (two) times daily before a meal.   GNP UltiCare Pen Needles 32G X 6 MM Misc Generic drug:  Insulin Pen Needle Use 3 times daily DX: E11.65   hydrochlorothiazide 25 MG tablet Commonly known as: HYDRODIURIL Take 1 tablet (25 mg total) by mouth daily.   hydrOXYzine 50 MG capsule Commonly known as: VISTARIL Take 1 capsule (50 mg total) by mouth 3 (three) times daily as needed.   insulin lispro 100 UNIT/ML KwikPen Commonly known as: HumaLOG KwikPen Inject 10 Units into the skin 3 (three) times daily with meals.   lidocaine 5 % ointment Commonly known as: XYLOCAINE Apply 1 application topically 3 (three) times daily as needed.   losartan 100 MG tablet Commonly known as: COZAAR Take 1 tablet (100 mg total) by mouth daily.   Potassium Chloride ER 20 MEQ Tbcr Take 20 mEq by mouth daily.   rosuvastatin 40 MG tablet Commonly known as: CRESTOR Take 1 tablet (40 mg total) by mouth daily.   Trulicity 4.5 MG/0.5ML Soaj Generic drug: Dulaglutide Inject 4.5 mg as directed once a week. DX: E11.65         Objective:   BP 138/71   Pulse 80   Ht 5\' 9"  (1.753 m)   Wt 192 lb (87.1 kg)   SpO2 96%   BMI 28.35 kg/m   Wt Readings from Last 3 Encounters:  08/26/23 192 lb (87.1 kg)  05/20/23 189 lb (85.7 kg)  03/04/23 185 lb (83.9 kg)    Physical Exam Vitals and nursing note reviewed.  Constitutional:      General: He is not in acute distress.    Appearance: He is well-developed. He is not diaphoretic.  Eyes:     General: No scleral icterus.    Conjunctiva/sclera: Conjunctivae normal.  Neck:     Thyroid: No thyromegaly.  Cardiovascular:     Rate and Rhythm: Normal rate and regular rhythm.     Heart sounds: Normal heart sounds. No murmur heard. Pulmonary:     Effort: Pulmonary effort is normal. No respiratory distress.     Breath sounds: Normal breath sounds. No wheezing.  Musculoskeletal:        General: No swelling. Normal range of motion.     Cervical back: Neck supple.  Lymphadenopathy:     Cervical: No cervical adenopathy.  Skin:    General: Skin is warm  and dry.     Findings: No rash.  Neurological:     Mental Status: He is alert and oriented to person, place, and time.     Coordination: Coordination normal.  Psychiatric:        Behavior: Behavior normal.       Assessment & Plan:  Problem List Items Addressed This Visit       Cardiovascular and Mediastinum   Essential hypertension, benign     Endocrine   Type 2 diabetes mellitus with other specified complication (HCC) - Primary   Relevant Orders   Bayer DCA Hb A1c Waived     Other   Mixed hyperlipidemia   Depression, recurrent (HCC)   Relevant Medications   citalopram (CELEXA) 40 MG tablet   hydrOXYzine (VISTARIL) 50 MG capsule   GAD (generalized anxiety disorder)   Relevant Medications   citalopram (CELEXA) 40 MG tablet   hydrOXYzine (VISTARIL) 50 MG capsule    A1c looks good at 6.1 and blood pressure looks good.  Restart citalopram to help with anxiety and depression.  Has a little congestion today, recommended taking over-the-counter allergy medicine such as Claritin. Follow up plan: Return in about 3 months (around 11/26/2023), or if symptoms worsen or fail to improve, for Diabetes and hypertension and cholesterol.  Counseling provided for all of the vaccine components Orders Placed This Encounter  Procedures   Bayer DCA Hb A1c Waived    Arville Care, MD [facility] 08/26/2023, 8:21 AM

## 2023-08-27 DIAGNOSIS — E1142 Type 2 diabetes mellitus with diabetic polyneuropathy: Secondary | ICD-10-CM | POA: Diagnosis not present

## 2023-08-27 DIAGNOSIS — B351 Tinea unguium: Secondary | ICD-10-CM | POA: Diagnosis not present

## 2023-08-27 DIAGNOSIS — L84 Corns and callosities: Secondary | ICD-10-CM | POA: Diagnosis not present

## 2023-08-27 DIAGNOSIS — M79676 Pain in unspecified toe(s): Secondary | ICD-10-CM | POA: Diagnosis not present

## 2023-08-27 LAB — MICROALBUMIN / CREATININE URINE RATIO
Creatinine, Urine: 42.8 mg/dL
Microalb/Creat Ratio: 345 mg/g{creat} — ABNORMAL HIGH (ref 0–29)
Microalbumin, Urine: 147.8 ug/mL

## 2023-08-31 DIAGNOSIS — M62830 Muscle spasm of back: Secondary | ICD-10-CM | POA: Diagnosis not present

## 2023-08-31 DIAGNOSIS — M9903 Segmental and somatic dysfunction of lumbar region: Secondary | ICD-10-CM | POA: Diagnosis not present

## 2023-08-31 DIAGNOSIS — M9905 Segmental and somatic dysfunction of pelvic region: Secondary | ICD-10-CM | POA: Diagnosis not present

## 2023-08-31 DIAGNOSIS — M9904 Segmental and somatic dysfunction of sacral region: Secondary | ICD-10-CM | POA: Diagnosis not present

## 2023-09-14 DIAGNOSIS — M62830 Muscle spasm of back: Secondary | ICD-10-CM | POA: Diagnosis not present

## 2023-09-14 DIAGNOSIS — M9903 Segmental and somatic dysfunction of lumbar region: Secondary | ICD-10-CM | POA: Diagnosis not present

## 2023-09-14 DIAGNOSIS — M9905 Segmental and somatic dysfunction of pelvic region: Secondary | ICD-10-CM | POA: Diagnosis not present

## 2023-09-14 DIAGNOSIS — M9904 Segmental and somatic dysfunction of sacral region: Secondary | ICD-10-CM | POA: Diagnosis not present

## 2023-09-28 DIAGNOSIS — M9905 Segmental and somatic dysfunction of pelvic region: Secondary | ICD-10-CM | POA: Diagnosis not present

## 2023-09-28 DIAGNOSIS — M9903 Segmental and somatic dysfunction of lumbar region: Secondary | ICD-10-CM | POA: Diagnosis not present

## 2023-09-28 DIAGNOSIS — M9904 Segmental and somatic dysfunction of sacral region: Secondary | ICD-10-CM | POA: Diagnosis not present

## 2023-09-28 DIAGNOSIS — M62830 Muscle spasm of back: Secondary | ICD-10-CM | POA: Diagnosis not present

## 2023-10-12 DIAGNOSIS — M9904 Segmental and somatic dysfunction of sacral region: Secondary | ICD-10-CM | POA: Diagnosis not present

## 2023-10-12 DIAGNOSIS — M9903 Segmental and somatic dysfunction of lumbar region: Secondary | ICD-10-CM | POA: Diagnosis not present

## 2023-10-12 DIAGNOSIS — M9905 Segmental and somatic dysfunction of pelvic region: Secondary | ICD-10-CM | POA: Diagnosis not present

## 2023-10-12 DIAGNOSIS — M62830 Muscle spasm of back: Secondary | ICD-10-CM | POA: Diagnosis not present

## 2023-10-14 ENCOUNTER — Other Ambulatory Visit: Payer: Self-pay | Admitting: Family Medicine

## 2023-10-14 DIAGNOSIS — E782 Mixed hyperlipidemia: Secondary | ICD-10-CM

## 2023-10-14 DIAGNOSIS — E1169 Type 2 diabetes mellitus with other specified complication: Secondary | ICD-10-CM

## 2023-10-16 ENCOUNTER — Other Ambulatory Visit: Payer: Self-pay | Admitting: *Deleted

## 2023-10-16 DIAGNOSIS — I10 Essential (primary) hypertension: Secondary | ICD-10-CM

## 2023-10-16 MED ORDER — LOSARTAN POTASSIUM 100 MG PO TABS: 100.0000 mg | ORAL_TABLET | Freq: Every day | ORAL | 0 refills | Status: DC

## 2023-10-26 DIAGNOSIS — M9904 Segmental and somatic dysfunction of sacral region: Secondary | ICD-10-CM | POA: Diagnosis not present

## 2023-10-26 DIAGNOSIS — M62830 Muscle spasm of back: Secondary | ICD-10-CM | POA: Diagnosis not present

## 2023-10-26 DIAGNOSIS — M9903 Segmental and somatic dysfunction of lumbar region: Secondary | ICD-10-CM | POA: Diagnosis not present

## 2023-10-26 DIAGNOSIS — M9905 Segmental and somatic dysfunction of pelvic region: Secondary | ICD-10-CM | POA: Diagnosis not present

## 2023-10-28 DIAGNOSIS — H25813 Combined forms of age-related cataract, bilateral: Secondary | ICD-10-CM | POA: Diagnosis not present

## 2023-10-28 DIAGNOSIS — H52223 Regular astigmatism, bilateral: Secondary | ICD-10-CM | POA: Diagnosis not present

## 2023-10-28 DIAGNOSIS — E119 Type 2 diabetes mellitus without complications: Secondary | ICD-10-CM | POA: Diagnosis not present

## 2023-10-28 DIAGNOSIS — H524 Presbyopia: Secondary | ICD-10-CM | POA: Diagnosis not present

## 2023-10-28 LAB — HM DIABETES EYE EXAM

## 2023-10-29 ENCOUNTER — Other Ambulatory Visit: Payer: Self-pay | Admitting: Family Medicine

## 2023-10-29 DIAGNOSIS — E782 Mixed hyperlipidemia: Secondary | ICD-10-CM

## 2023-10-29 DIAGNOSIS — E1169 Type 2 diabetes mellitus with other specified complication: Secondary | ICD-10-CM

## 2023-11-05 DIAGNOSIS — M79675 Pain in left toe(s): Secondary | ICD-10-CM | POA: Diagnosis not present

## 2023-11-05 DIAGNOSIS — L84 Corns and callosities: Secondary | ICD-10-CM | POA: Diagnosis not present

## 2023-11-05 DIAGNOSIS — E1142 Type 2 diabetes mellitus with diabetic polyneuropathy: Secondary | ICD-10-CM | POA: Diagnosis not present

## 2023-11-05 DIAGNOSIS — M79674 Pain in right toe(s): Secondary | ICD-10-CM | POA: Diagnosis not present

## 2023-11-05 DIAGNOSIS — B351 Tinea unguium: Secondary | ICD-10-CM | POA: Diagnosis not present

## 2023-11-09 DIAGNOSIS — M9904 Segmental and somatic dysfunction of sacral region: Secondary | ICD-10-CM | POA: Diagnosis not present

## 2023-11-09 DIAGNOSIS — M9903 Segmental and somatic dysfunction of lumbar region: Secondary | ICD-10-CM | POA: Diagnosis not present

## 2023-11-09 DIAGNOSIS — M9905 Segmental and somatic dysfunction of pelvic region: Secondary | ICD-10-CM | POA: Diagnosis not present

## 2023-11-09 DIAGNOSIS — M62830 Muscle spasm of back: Secondary | ICD-10-CM | POA: Diagnosis not present

## 2023-11-23 DIAGNOSIS — M9905 Segmental and somatic dysfunction of pelvic region: Secondary | ICD-10-CM | POA: Diagnosis not present

## 2023-11-23 DIAGNOSIS — M9904 Segmental and somatic dysfunction of sacral region: Secondary | ICD-10-CM | POA: Diagnosis not present

## 2023-11-23 DIAGNOSIS — M62830 Muscle spasm of back: Secondary | ICD-10-CM | POA: Diagnosis not present

## 2023-11-23 DIAGNOSIS — M9903 Segmental and somatic dysfunction of lumbar region: Secondary | ICD-10-CM | POA: Diagnosis not present

## 2023-12-07 DIAGNOSIS — M9903 Segmental and somatic dysfunction of lumbar region: Secondary | ICD-10-CM | POA: Diagnosis not present

## 2023-12-07 DIAGNOSIS — M62830 Muscle spasm of back: Secondary | ICD-10-CM | POA: Diagnosis not present

## 2023-12-07 DIAGNOSIS — M9905 Segmental and somatic dysfunction of pelvic region: Secondary | ICD-10-CM | POA: Diagnosis not present

## 2023-12-07 DIAGNOSIS — M9904 Segmental and somatic dysfunction of sacral region: Secondary | ICD-10-CM | POA: Diagnosis not present

## 2023-12-21 ENCOUNTER — Ambulatory Visit: Admitting: Family Medicine

## 2023-12-21 DIAGNOSIS — M9904 Segmental and somatic dysfunction of sacral region: Secondary | ICD-10-CM | POA: Diagnosis not present

## 2023-12-21 DIAGNOSIS — M9903 Segmental and somatic dysfunction of lumbar region: Secondary | ICD-10-CM | POA: Diagnosis not present

## 2023-12-21 DIAGNOSIS — M62830 Muscle spasm of back: Secondary | ICD-10-CM | POA: Diagnosis not present

## 2023-12-21 DIAGNOSIS — M9905 Segmental and somatic dysfunction of pelvic region: Secondary | ICD-10-CM | POA: Diagnosis not present

## 2023-12-23 ENCOUNTER — Ambulatory Visit (INDEPENDENT_AMBULATORY_CARE_PROVIDER_SITE_OTHER): Admitting: Family Medicine

## 2023-12-23 ENCOUNTER — Encounter: Payer: Self-pay | Admitting: Family Medicine

## 2023-12-23 VITALS — BP 117/69 | HR 103 | Ht 69.0 in

## 2023-12-23 DIAGNOSIS — E1169 Type 2 diabetes mellitus with other specified complication: Secondary | ICD-10-CM | POA: Diagnosis not present

## 2023-12-23 DIAGNOSIS — I10 Essential (primary) hypertension: Secondary | ICD-10-CM | POA: Diagnosis not present

## 2023-12-23 DIAGNOSIS — E782 Mixed hyperlipidemia: Secondary | ICD-10-CM

## 2023-12-23 DIAGNOSIS — Z122 Encounter for screening for malignant neoplasm of respiratory organs: Secondary | ICD-10-CM | POA: Diagnosis not present

## 2023-12-23 DIAGNOSIS — Z794 Long term (current) use of insulin: Secondary | ICD-10-CM | POA: Diagnosis not present

## 2023-12-23 DIAGNOSIS — R350 Frequency of micturition: Secondary | ICD-10-CM | POA: Diagnosis not present

## 2023-12-23 DIAGNOSIS — R29898 Other symptoms and signs involving the musculoskeletal system: Secondary | ICD-10-CM

## 2023-12-23 DIAGNOSIS — Z9181 History of falling: Secondary | ICD-10-CM | POA: Diagnosis not present

## 2023-12-23 LAB — URINALYSIS, COMPLETE
Bilirubin, UA: NEGATIVE
Leukocytes,UA: NEGATIVE
Nitrite, UA: NEGATIVE
Specific Gravity, UA: 1.02 (ref 1.005–1.030)
Urobilinogen, Ur: 1 mg/dL (ref 0.2–1.0)
pH, UA: 6 (ref 5.0–7.5)

## 2023-12-23 LAB — MICROSCOPIC EXAMINATION
Bacteria, UA: NONE SEEN
Epithelial Cells (non renal): NONE SEEN /HPF (ref 0–10)
RBC, Urine: NONE SEEN /HPF (ref 0–2)
Renal Epithel, UA: NONE SEEN /HPF
WBC, UA: NONE SEEN /HPF (ref 0–5)
Yeast, UA: NONE SEEN

## 2023-12-23 LAB — HGB A1C W/O EAG: Hgb A1c MFr Bld: 10.4 % — ABNORMAL HIGH (ref 4.8–5.6)

## 2023-12-23 MED ORDER — AMLODIPINE BESYLATE 10 MG PO TABS
10.0000 mg | ORAL_TABLET | Freq: Every day | ORAL | 3 refills | Status: AC
Start: 2023-12-23 — End: ?

## 2023-12-23 MED ORDER — INSULIN LISPRO (1 UNIT DIAL) 100 UNIT/ML (KWIKPEN): 10.0000 [IU] | PEN_INJECTOR | Freq: Three times a day (TID) | SUBCUTANEOUS | 11 refills | Status: DC

## 2023-12-23 MED ORDER — ROSUVASTATIN CALCIUM 40 MG PO TABS: 40.0000 mg | ORAL_TABLET | Freq: Every day | ORAL | 3 refills | Status: DC

## 2023-12-23 MED ORDER — BASAGLAR KWIKPEN 100 UNIT/ML ~~LOC~~ SOPN: 70.0000 [IU] | PEN_INJECTOR | Freq: Every day | SUBCUTANEOUS | 5 refills | Status: DC

## 2023-12-23 MED ORDER — HYDROCHLOROTHIAZIDE 25 MG PO TABS: 25.0000 mg | ORAL_TABLET | Freq: Every day | ORAL | 3 refills | Status: AC

## 2023-12-23 MED ORDER — GNP ULTICARE PEN NEEDLES 32G X 6 MM MISC: 3 refills | Status: AC

## 2023-12-23 MED ORDER — TRULICITY 4.5 MG/0.5ML ~~LOC~~ SOAJ: 4.5000 mg | SUBCUTANEOUS | 3 refills | Status: DC

## 2023-12-23 NOTE — Addendum Note (Signed)
 Addended by: LEIGH ROSINA SAILOR on: 12/23/2023 08:47 AM   Modules accepted: Orders

## 2023-12-23 NOTE — Progress Notes (Addendum)
 BP 117/69   Pulse (!) 103   Ht 5' 9 (1.753 m)   SpO2 95%   BMI 28.35 kg/m    Subjective:   Patient ID: Randy [name], male    DOB: 1951/03/06, 73 y.o.   MRN: [id number]  HPI: Randy [name] is a 73 y.o. male presenting on 12/23/2023 for Medical Management of Chronic Issues, Diabetes, Extremity Weakness (BLE), and Urinary Frequency   HPI Type 2 diabetes mellitus Patient comes in today for recheck of his diabetes. Patient has been currently taking Humalog  and Basaglar  and Trulicity . Patient is currently on an ACE inhibitor/ARB. Patient has not seen an ophthalmologist this year. Patient denies any new issues with their feet. The symptom started onset as an adult hypertension and hyperlipidemia ARE RELATED TO DM   Hypertension Patient is currently on losartan  and amlodipine  and hydrochlorothiazide , and their blood pressure today is 117/69. Patient denies any lightheadedness or dizziness. Patient denies headaches, blurred vision, chest pains, shortness of breath, or weakness. Denies any side effects from medication and is content with current medication.   Hyperlipidemia Patient is coming in for recheck of his hyperlipidemia. The patient is currently taking Crestor . They deny any issues with myalgias or history of liver damage from it. They deny any focal numbness or weakness or chest pain.   Patient has been out of his Humalog  recently but is also been having urinary frequency complains of that today.  He denies any burning or pain or blood in his urine.  Been going on for at least a few weeks.  He has been out of Humalog  for a few weeks as well.  Patient also complains that he has been having increasing gradual lower extremity weakness.  He denies any pain in there but just says it has been getting weaker and weaker over the last year.  He says he wants to do something to help get his strength back up. He denies any pain in his legs or any significant changes in his back.  Relevant  past medical, surgical, family and social history reviewed and updated as indicated. Interim medical history since our last visit reviewed. Allergies and medications reviewed and updated.  Review of Systems  Constitutional:  Negative for chills and fever.  Eyes:  Negative for visual disturbance.  Respiratory:  Negative for shortness of breath and wheezing.   Cardiovascular:  Negative for chest pain and leg swelling.  Genitourinary:  Positive for frequency.  Musculoskeletal:  Negative for back pain and gait problem.  Skin:  Negative for rash.  Neurological:  Positive for weakness. Negative for dizziness and light-headedness.  All other systems reviewed and are negative.   Per HPI unless specifically indicated above   Allergies as of 12/23/2023   No Known Allergies      Medication List        Accurate as of December 23, 2023  8:30 AM. If you have any questions, ask your nurse or doctor.          Accu-Chek Guide test strip Generic drug: glucose blood Use as instructed to test blood sugar daily. DX: E11.65 (patient using guide meter now)   amLODipine  10 MG tablet Commonly known as: NORVASC  Take 1 tablet (10 mg total) by mouth daily.   aspirin  EC 81 MG tablet Take 81 mg by mouth daily.   Basaglar  KwikPen 100 UNIT/ML Inject 70 Units into the skin daily.   cilostazol  100 MG tablet Commonly known as: PLETAL  TAKE ONE (1) TABLET BY  MOUTH TWO (2) TIMES DAILY   citalopram  40 MG tablet Commonly known as: CELEXA  Take 1 tablet (40 mg total) by mouth daily.   glucose monitoring kit monitoring kit 1 each by Does not apply route 2 (two) times daily before a meal.   GNP UltiCare Pen Needles 32G X 6 MM Misc Generic drug: Insulin  Pen Needle Use 3 times daily DX: E11.65   hydrochlorothiazide  25 MG tablet Commonly known as: HYDRODIURIL  Take 1 tablet (25 mg total) by mouth daily.   hydrOXYzine  50 MG capsule Commonly known as: VISTARIL  Take 1 capsule (50 mg total) by mouth 3  (three) times daily as needed.   insulin  lispro 100 UNIT/ML KwikPen Commonly known as: HumaLOG  KwikPen Inject 10 Units into the skin 3 (three) times daily with meals.   lidocaine  5 % ointment Commonly known as: XYLOCAINE  Apply 1 application topically 3 (three) times daily as needed.   losartan  100 MG tablet Commonly known as: COZAAR  Take 1 tablet (100 mg total) by mouth daily.   Potassium Chloride  ER 20 MEQ Tbcr Take 20 mEq by mouth daily.   rosuvastatin  40 MG tablet Commonly known as: CRESTOR  Take 1 tablet (40 mg total) by mouth daily.   Trulicity  4.5 MG/0.5ML Soaj Generic drug: Dulaglutide  Inject 4.5 mg as directed once a week. DX: E11.65         Objective:   BP 117/69   Pulse (!) 103   Ht 5' 9 (1.753 m)   SpO2 95%   BMI 28.35 kg/m   Wt Readings from Last 3 Encounters:  08/26/23 192 lb (87.1 kg)  05/20/23 189 lb (85.7 kg)  03/04/23 185 lb (83.9 kg)    Physical Exam Vitals and nursing note reviewed.  Constitutional:      General: He is not in acute distress.    Appearance: He is well-developed. He is not diaphoretic.  Eyes:     General: No scleral icterus.    Conjunctiva/sclera: Conjunctivae normal.  Neck:     Thyroid : No thyromegaly.  Cardiovascular:     Rate and Rhythm: Normal rate and regular rhythm.     Heart sounds: Normal heart sounds. No murmur heard. Pulmonary:     Effort: Pulmonary effort is normal. No respiratory distress.     Breath sounds: Normal breath sounds. No wheezing.  Musculoskeletal:        General: No swelling, tenderness or deformity. Normal range of motion.     Cervical back: Neck supple.  Lymphadenopathy:     Cervical: No cervical adenopathy.  Skin:    General: Skin is warm and dry.     Findings: No rash.  Neurological:     Mental Status: He is alert and oriented to person, place, and time.     Coordination: Coordination normal.  Psychiatric:        Behavior: Behavior normal.     Results for orders placed or  performed in visit on 10/28/23  HM DIABETES EYE EXAM   Collection Time: 10/28/23 12:35 PM  Result Value Ref Range   HM Diabetic Eye Exam No Retinopathy No Retinopathy    Assessment & Plan:   Problem List Items Addressed This Visit       Cardiovascular and Mediastinum   Essential hypertension, benign   Relevant Medications   amLODipine  (NORVASC ) 10 MG tablet   hydrochlorothiazide  (HYDRODIURIL ) 25 MG tablet   rosuvastatin  (CRESTOR ) 40 MG tablet     Endocrine   Type 2 diabetes mellitus with other specified complication (HCC) -  Primary   Relevant Medications   insulin  lispro (HUMALOG  KWIKPEN) 100 UNIT/ML KwikPen   Insulin  Glargine (BASAGLAR  KWIKPEN) 100 UNIT/ML   Dulaglutide  (TRULICITY ) 4.5 MG/0.5ML SOAJ   rosuvastatin  (CRESTOR ) 40 MG tablet   Other Relevant Orders   Hgb A1c w/o eAG     Other   Mixed hyperlipidemia   Relevant Medications   amLODipine  (NORVASC ) 10 MG tablet   hydrochlorothiazide  (HYDRODIURIL ) 25 MG tablet   rosuvastatin  (CRESTOR ) 40 MG tablet   Other Visit Diagnoses       Urinary frequency       Relevant Orders   Urinalysis, Complete   Urine Culture     Screening for lung cancer       Relevant Orders   Ambulatory Referral Lung Cancer Screening [facility]     Weakness of both lower extremities       Relevant Orders   Ambulatory referral to Physical Therapy     At moderate risk for fall       Relevant Orders   Ambulatory referral to Physical Therapy     Placed referral to physical therapy for strength.  Urinalysis shows 3+ glucose so likely having symptoms because his blood sugars are elevated.  Have sent refills for all of his medications although he may have to contact prescription assistance to get some of them.   Follow up plan: Return in about 3 months (around 03/24/2024), or if symptoms worsen or fail to improve, for Diabetes recheck.  Counseling provided for all of the vaccine components Orders Placed This Encounter  Procedures    Urine Culture   Urinalysis, Complete   Hgb A1c w/o eAG   Ambulatory Referral Lung Cancer Screening [facility]   Ambulatory referral to Physical Therapy    Fonda Levins, MD [facility] 12/23/2023, 8:30 AM

## 2023-12-24 ENCOUNTER — Ambulatory Visit: Payer: Self-pay | Admitting: Family Medicine

## 2023-12-24 ENCOUNTER — Telehealth: Payer: Self-pay | Admitting: Family Medicine

## 2023-12-24 LAB — URINE CULTURE: Organism ID, Bacteria: NO GROWTH

## 2023-12-24 NOTE — Telephone Encounter (Signed)
 Patient came by to pickup some insulin  and nobody could find anything for him. Got a call this morning and told him to come and pick it up. Told patient I would send a message out to get a call back.

## 2023-12-31 NOTE — Telephone Encounter (Unsigned)
 Copied from CRM [id number]. Topic: Clinical - Prescription Issue >> Dec 31, 2023  1:35 PM Antwanette L wrote: Reason for CRM: Patient needs a refill on novolog   insulin  but the medicine is not listed on his chart. The patient was taking insulin  lispro (HUMALOG  KWIKPEN) 100 UNIT/ML KwikPen. Please contact the patient at [phone]

## 2024-01-04 DIAGNOSIS — M9904 Segmental and somatic dysfunction of sacral region: Secondary | ICD-10-CM | POA: Diagnosis not present

## 2024-01-04 DIAGNOSIS — M62830 Muscle spasm of back: Secondary | ICD-10-CM | POA: Diagnosis not present

## 2024-01-04 DIAGNOSIS — M9903 Segmental and somatic dysfunction of lumbar region: Secondary | ICD-10-CM | POA: Diagnosis not present

## 2024-01-04 DIAGNOSIS — M9905 Segmental and somatic dysfunction of pelvic region: Secondary | ICD-10-CM | POA: Diagnosis not present

## 2024-01-06 ENCOUNTER — Other Ambulatory Visit: Payer: Self-pay | Admitting: Family Medicine

## 2024-01-06 DIAGNOSIS — F339 Major depressive disorder, recurrent, unspecified: Secondary | ICD-10-CM

## 2024-01-06 DIAGNOSIS — F411 Generalized anxiety disorder: Secondary | ICD-10-CM

## 2024-01-07 ENCOUNTER — Other Ambulatory Visit: Payer: Self-pay | Admitting: Family Medicine

## 2024-01-07 ENCOUNTER — Telehealth: Payer: Self-pay

## 2024-01-07 DIAGNOSIS — F411 Generalized anxiety disorder: Secondary | ICD-10-CM

## 2024-01-07 DIAGNOSIS — F339 Major depressive disorder, recurrent, unspecified: Secondary | ICD-10-CM

## 2024-01-07 NOTE — Telephone Encounter (Signed)
 Copied from CRM [id number]. Topic: Clinical - Prescription Issue >> Dec 31, 2023  1:35 PM Antwanette L wrote: Reason for CRM: Patient needs a refill on novolog   insulin  but the medicine is not listed on his chart. The patient was taking insulin  lispro (HUMALOG  KWIKPEN) 100 UNIT/ML KwikPen. Please contact the patient at [phone]

## 2024-01-07 NOTE — Telephone Encounter (Signed)
 Copied from CRM [id number]. Topic: Clinical - Medication Refill >> Jan 07, 2024  1:28 PM Yolanda T wrote: Medication:  hydrOXYzine  (VISTARIL ) 50 MG capsule   Has the patient contacted their pharmacy? Yes  This is the patient's preferred pharmacy:  [facility] [place] - [address] [place] [address] Phone: [phone] Fax: [phone]  Is this the correct pharmacy for this prescription? Yes  Has the prescription been filled recently? Yes  Is the patient out of the medication? Yes  Has the patient been seen for an appointment in the last year OR does the patient have an upcoming appointment? Yes  Can we respond through MyChart? No  Agent: Please be advised that Rx refills may take up to 3 business days. We ask that you follow-up with your pharmacy.

## 2024-01-07 NOTE — Telephone Encounter (Signed)
 No answer and vmail is set up.    Pt is supposed to be taking Humalog , Basaglar  and Trulicity  for DM.

## 2024-01-07 NOTE — Telephone Encounter (Signed)
 Copied from CRM [id number]. Topic: Clinical - Medication Question >> Jan 07, 2024  1:32 PM Yolanda T wrote: Reason for CRM: patient called stated he needed to get a script for novolog   insulin  to replace the (HUMALOG  KWIKPEN) 100 UNIT/ML KwikPen. Please f/u with patient

## 2024-01-07 NOTE — Telephone Encounter (Signed)
 Duplicate

## 2024-01-08 MED ORDER — INSULIN ASPART 100 UNIT/ML IJ SOLN: 10.0000 [IU] | Freq: Three times a day (TID) | INTRAMUSCULAR | 99 refills | Status: DC

## 2024-01-08 NOTE — Telephone Encounter (Signed)
 Novolog  10u TID [facility] [place].  Pt made aware.

## 2024-01-08 NOTE — Telephone Encounter (Signed)
 Redell called from [facility] stating that the reason why patient is requesting NOVOLOG  instead of HUMALOG  is because HUMALOG  is not covered by his insurance without a PA. Says a PA was requesting over a week ago which is how long patient has been without his insulin . Requesting PCP send in an electronic script for NOVOLOG  asap.

## 2024-01-08 NOTE — Addendum Note (Signed)
 Addended by: LEIGH ROSINA SAILOR on: 01/08/2024 02:48 PM   Modules accepted: Orders

## 2024-01-08 NOTE — Telephone Encounter (Signed)
 Please send in NovoLog  for the patient, same dose and same prescription amount as the Humalog .

## 2024-01-13 ENCOUNTER — Telehealth: Payer: Self-pay | Admitting: Pharmacist

## 2024-01-13 ENCOUNTER — Other Ambulatory Visit (INDEPENDENT_AMBULATORY_CARE_PROVIDER_SITE_OTHER)

## 2024-01-13 ENCOUNTER — Ambulatory Visit: Attending: Family Medicine

## 2024-01-13 DIAGNOSIS — R29818 Other symptoms and signs involving the nervous system: Secondary | ICD-10-CM | POA: Insufficient documentation

## 2024-01-13 DIAGNOSIS — M6281 Muscle weakness (generalized): Secondary | ICD-10-CM | POA: Insufficient documentation

## 2024-01-13 DIAGNOSIS — R2681 Unsteadiness on feet: Secondary | ICD-10-CM | POA: Insufficient documentation

## 2024-01-13 DIAGNOSIS — Z9181 History of falling: Secondary | ICD-10-CM | POA: Diagnosis not present

## 2024-01-13 DIAGNOSIS — E782 Mixed hyperlipidemia: Secondary | ICD-10-CM

## 2024-01-13 DIAGNOSIS — R29898 Other symptoms and signs involving the musculoskeletal system: Secondary | ICD-10-CM | POA: Diagnosis not present

## 2024-01-13 DIAGNOSIS — Z794 Long term (current) use of insulin: Secondary | ICD-10-CM

## 2024-01-13 DIAGNOSIS — E1169 Type 2 diabetes mellitus with other specified complication: Secondary | ICD-10-CM

## 2024-01-13 DIAGNOSIS — I63311 Cerebral infarction due to thrombosis of right middle cerebral artery: Secondary | ICD-10-CM

## 2024-01-13 MED ORDER — INSULIN LISPRO (1 UNIT DIAL) 100 UNIT/ML (KWIKPEN): 15.0000 [IU] | PEN_INJECTOR | Freq: Three times a day (TID) | SUBCUTANEOUS | 5 refills | Status: DC

## 2024-01-13 MED ORDER — TRULICITY 4.5 MG/0.5ML ~~LOC~~ SOAJ: 4.5000 mg | SUBCUTANEOUS | 3 refills | Status: DC

## 2024-01-13 MED ORDER — BASAGLAR KWIKPEN 100 UNIT/ML ~~LOC~~ SOPN: 70.0000 [IU] | PEN_INJECTOR | Freq: Every day | SUBCUTANEOUS | 5 refills | Status: DC

## 2024-01-13 NOTE — Therapy (Signed)
 OUTPATIENT PHYSICAL THERAPY LOWER EXTREMITY EVALUATION   Patient Name: Randy [name] MRN: [id number] DOB:November 16, 1950, 73 y.o., male Today's Date: 01/13/2024  END OF SESSION:  PT End of Session - 01/13/24 0928     Visit Number 1    Number of Visits 12    Date for PT Re-Evaluation 04/08/24    PT Start Time 0930    PT Stop Time 0953    PT Time Calculation (min) 23 min    Activity Tolerance Patient tolerated treatment well    Behavior During Therapy [facility] for tasks assessed/performed          Past Medical History:  Diagnosis Date   Diabetes mellitus without complication (HCC)    Hyperlipidemia    Hypertension    Past Surgical History:  Procedure Laterality Date   HEMORRHOID SURGERY N/A 06/08/2013   Procedure: HEMORRHOIDECTOMY;  Surgeon: Bernarda Ned, MD;  Location: [facility] [place];  Service: General;  Laterality: N/A;   NO PAST SURGERIES     Patient Active Problem List   Diagnosis Date Noted   Hypokalemia 11/05/2022   CVA (cerebral vascular accident) (HCC) 11/04/2022   GAD (generalized anxiety disorder) 09/19/2020   Atherosclerosis of native arteries of extremity with intermittent claudication (HCC) 10/21/2017   Anaphylactic syndrome 10/21/2016   Current smoker 06/04/2016   Depression, recurrent (HCC) 06/04/2016   Type 2 diabetes mellitus with other specified complication (HCC) 05/09/2015   Essential hypertension, benign 05/09/2015   Mixed hyperlipidemia 05/09/2015   REFERRING PROVIDER: Dettinger, Fonda LABOR, MD   REFERRING DIAG: Weakness of both lower extremities, At moderate risk for fall   THERAPY DIAG:  Unsteadiness on feet  Muscle weakness (generalized)  Rationale for Evaluation and Treatment: Rehabilitation  ONSET DATE: 1 year ago  SUBJECTIVE:   SUBJECTIVE STATEMENT: Patient reports that he has been having trouble getting around for about a year now with no known cause. He notes that some days are worse than others. He has fallen before, but  it was over six months ago. He can typically tell if it is a good day or bad day depending on how he wakes up.   PERTINENT HISTORY: COPD, hypertension, type 2 diabetes, history of a CVA, depression, and anxiety PAIN:  Are you having pain? Yes: NPRS scale: 5-6/10 Pain location: posterior aspect of both legs Pain description: sore  PRECAUTIONS: None  RED FLAGS: None   WEIGHT BEARING RESTRICTIONS: No  FALLS:  Has patient fallen in last 6 months? No  LIVING ENVIRONMENT: Lives with: lives alone Lives in: House/apartment Stairs: Yes: External: 1 steps; none Has following equipment at home: None  OCCUPATION: Conservation officer, [employer]; part time  PLOF: Independent  PATIENT GOALS: improved mobility and be able to mow his yard (riding lawnmower)   NEXT MD VISIT: 03/23/24  OBJECTIVE:  Note: Objective measures were completed at Evaluation unless otherwise noted.  COGNITION: Overall cognitive status: Within functional limits for tasks assessed     SENSATION: Patient reports no numbness or tingling  LOWER EXTREMITY ROM: WFL for activities assessed  LOWER EXTREMITY MMT:  MMT Right eval Left eval  Hip flexion 4/5 4-/5  Hip extension    Hip abduction    Hip adduction    Hip internal rotation    Hip external rotation    Knee flexion 4-/5 4/5  Knee extension 4/5 4-/5  Ankle dorsiflexion 3+/5 3+/5  Ankle plantarflexion    Ankle inversion    Ankle eversion     (Blank rows = not tested)  FUNCTIONAL  TESTS:  5 times sit to stand: 22.74 seconds with infrequent UE support from armrests Timed up and go (TUG): 15.11 seconds Sit to stand transfer: infrequent posterior loss of balance and required close supervision  GAIT: Assistive device utilized: None Level of assistance: Complete Independence Comments: decreased stride length and poor foot clearance bilaterally  Gait speed: 0.78 m/s                                                                                                                                  TREATMENT DATE:     PATIENT EDUCATION:  Education details: Plan of care, prognosis, objective findings, and goals for physical therapy Person educated: Patient Education method: Explanation Education comprehension: verbalized understanding  HOME EXERCISE PROGRAM:   ASSESSMENT:  CLINICAL IMPRESSION: Patient is a 73 y.o. male who was seen today for physical therapy evaluation and treatment for unsteadiness on his feet.  He is at a high risk of falling as evidenced by his gait mechanics, lower extremity muscular strength, and functional testing.  Recommend that he continue with skilled physical therapy to address his impairments to maximize his safety and functional mobility.  OBJECTIVE IMPAIRMENTS: Abnormal gait, decreased activity tolerance, decreased balance, decreased mobility, difficulty walking, decreased strength, and pain.   ACTIVITY LIMITATIONS: standing, transfers, and locomotion level  PARTICIPATION LIMITATIONS: shopping, community activity, and yard work  PERSONAL FACTORS: Past/current experiences, Time since onset of injury/illness/exacerbation, and 3+ comorbidities: COPD, hypertension, type 2 diabetes, history of a CVA, depression, and anxiety are also affecting patient's functional outcome.   REHAB POTENTIAL: Good  CLINICAL DECISION MAKING: Evolving/moderate complexity  EVALUATION COMPLEXITY: Moderate   GOALS: Goals reviewed with patient? Yes  SHORT TERM GOALS: Target date: 02/03/24 Patient will be independent with his initial HEP. Baseline: Goal status: INITIAL  2.  Patient will improve his timed up and go time to 12 seconds or less to reduce his fall risk. Baseline:  Goal status: INITIAL  3.  Patient will improve his 5 times sit to stand time to 17 seconds or less for improved lower extremity power. Baseline:  Goal status: INITIAL  LONG TERM GOALS: Target date: 02/24/24  Patient will be independent with his advanced HEP. Baseline:   Goal status: INITIAL  2.  Patient will improve his gait speed to at least 1.0 m/s for improved community mobility. Baseline:  Goal status: INITIAL  3.  Patient will be able to transfer from sitting to standing with minimal to no difficulty for improved improved independence. Baseline:  Goal status: INITIAL  4.  Patient will improve his 5 times sit to stand time to 12 seconds or less to reduce his fall risk. Baseline:  Goal status: INITIAL  PLAN:  PT FREQUENCY: 2x/week  PT DURATION: 6 weeks  PLANNED INTERVENTIONS: [id number]- PT Re-evaluation, 97750- Physical Performance Testing, 97110-Therapeutic exercises, 97530- Therapeutic activity, [id number]- Neuromuscular re-education, 97535- Self Care, [id number]- Manual therapy, [phone]- Gait training, Patient/Family education, Balance training,  and Stair training  PLAN FOR NEXT SESSION: NuStep, lower extremity strengthening, gait training, and balance interventions   Lacinda JAYSON Fass, PT 01/13/2024, 12:43 PM

## 2024-01-13 NOTE — Progress Notes (Signed)
 01/13/2024 Name: Randy [name] MRN: [id number] DOB: [birth year]  Chief Complaint  Patient presents with   Diabetes    Randy [name] is a 73 y.o. year old male who was referred for medication management by their primary care provider, Dettinger, Fonda LABOR, MD. I connected with  Kayla KANDICE Olivo on 01/13/24 by telephone and verified that I am speaking with the correct person using two identifiers. I discussed the limitations of evaluation and management by telemedicine. The patient expressed understanding and agreed to proceed.  Patient was located in her home and PharmD in PCP office during this visit.  They were referred to the pharmacist by their PCP for assistance in managing diabetes   Subjective: Patient is having difficulties getting medications via [employer] patient assistance program.  He has previously been enrolled in [employer] PAP for insulins and trulicity .  He has used Libre 3 CGM (with reader) to monitor blood sugar in the past, but the reader stopped working. Denies having complaints or concerns with his current medication regimen.   Care Team: Primary Care Provider: Dettinger, Fonda LABOR, MD ; Next Scheduled Visit: 05/20/23  Medication Access/Adherence  Current Pharmacy:  [facility] [place] - [address] [place] [address] Phone: [phone] Fax: [phone]  [facility] [address], [place] - [address] [place] [address] [place] [address] Phone: [phone] Fax: [phone]  [facility] [place], [place] - [address] [place] [address] Phone: [phone] Fax: [phone]  Patient reports affordability concerns with their medications: No -now enrolled in PAP Patient reports access/transportation concerns to their pharmacy: No  Patient reports adherence concerns with their medications:  No    Diabetes:  Current medications: Basaglar   (insulin  glargine) 75 units daily, Humalog  (insulin  lispro) 15 units with meals, Trulicity  (dulaglutide ) 4.5 mg weekly. Medications tried in the past: Farxiga  (dapagliflozin )- frequent urination- not willing to retrial.  Notices that Trulicity  needle is usually bent when he removes it. Occasional bruising.   Current glucose readings: not checking post-prandially  PAST CGM experience:  Has FL3 sensor and reader at home. Reader stopped working after he went through [employer] with sensor. He did call [employer] to request a new reader and they sent him replacement sensors. At last appt, pt stated that he would try another sensor - but today he reports that he was not going to do this.   Patient denies hypoglycemic s/sx including dizziness, shakiness, sweating. Patient denies hyperglycemic symptoms including polyuria, polydipsia, polyphagia, nocturia, neuropathy, blurred vision.  Current meal patterns: 3 meals/day - usually eating at restaurants. Likes green beans, pinto beans, corn, cabbage, cornbread. - Drinks diet mountain dew, one bottle water/day.   Physical activity: currently limited by back pain   Objective:  Lab Results  Component Value Date   HGBA1C 10.4 (H) 12/23/2023    Lab Results  Component Value Date   CREATININE 1.16 05/20/2023   BUN 18 05/20/2023   NA 140 05/20/2023   K 3.8 05/20/2023   CL 97 05/20/2023   CO2 26 05/20/2023    Lab Results  Component Value Date   CHOL 149 05/20/2023   HDL 44 05/20/2023   LDLCALC 83 05/20/2023   TRIG 123 05/20/2023   CHOLHDL 3.4 05/20/2023    Medications Reviewed Today     Reviewed by Billee Mliss BIRCH, [facility] (Pharmacist) on 01/13/24 at 1236  Med List Status: <  None>   Medication Order Taking? Sig Documenting Provider Last Dose Status Informant  amLODipine  (NORVASC ) 10 MG tablet [id number]  Take 1 tablet (10 mg total) by mouth daily. Dettinger, Fonda LABOR, MD  Active   aspirin  EC 81 MG tablet [id number]  Take 81 mg by mouth daily.  [provider]  Active Self  cilostazol  (PLETAL ) 100 MG tablet [id number]  TAKE ONE (1) TABLET BY MOUTH TWO (2) TIMES DAILY Dettinger, Fonda LABOR, MD  Active   citalopram  (CELEXA ) 40 MG tablet [id number]  Take 1 tablet (40 mg total) by mouth daily. Dettinger, Fonda LABOR, MD  Active   Dulaglutide  (TRULICITY ) 4.5 MG/0.5ML EMMANUEL [id number]  Inject 4.5 mg as directed once a week. DX: E11.65 Dettinger, Fonda LABOR, MD  Active   glucose blood (ACCU-CHEK GUIDE) test strip [id number]  Use as instructed to test blood sugar daily. DX: E11.65 (patient using guide meter now) Dettinger, Fonda LABOR, MD  Active Self  glucose monitoring kit (FREESTYLE) monitoring kit [id number]  1 each by Does not apply route 2 (two) times daily before a meal. Dettinger, Fonda LABOR, MD  Active Self  hydrochlorothiazide  (HYDRODIURIL ) 25 MG tablet [id number]  Take 1 tablet (25 mg total) by mouth daily. Dettinger, Fonda LABOR, MD  Active   hydrOXYzine  (VISTARIL ) 50 MG capsule [id number]  TAKE ONE TABLET BY MOUTH THREE TIMES DAILY AS NEEDED Dettinger, Fonda LABOR, MD  Active   insulin  aspart (NOVOLOG ) 100 UNIT/ML injection [id number]  Inject 10 Units into the skin 3 (three) times daily before meals. Dettinger, Fonda LABOR, MD  Consider Medication Status and Discontinue (Change in therapy)   Insulin  Glargine (BASAGLAR  KWIKPEN) 100 UNIT/ML [id number]  Inject 70 Units into the skin daily. Dettinger, Fonda LABOR, MD  Active   Insulin  Pen Needle (GNP ULTICARE PEN NEEDLES) 32G X 6 MM MISC [id number]  Use 3 times daily DX: E11.65 Dettinger, Fonda LABOR, MD  Active   lidocaine  (XYLOCAINE ) 5 % ointment [id number]  Apply 1 application topically 3 (three) times daily as needed. Dettinger, Fonda LABOR, MD  Active Self  losartan  (COZAAR ) 100 MG tablet [id number]  Take 1 tablet (100 mg total) by mouth daily. Dettinger, Fonda LABOR, MD  Active   Potassium Chloride  ER 20 MEQ TBCR [id number]  Take 20 mEq by mouth daily.  [provider]  Active Self  rosuvastatin  (CRESTOR ) 40 MG  tablet [id number]  Take 1 tablet (40 mg total) by mouth daily. Dettinger, Fonda LABOR, MD  Active             Assessment/Plan:   Diabetes: - Currently uncontrolled with most recent A1c of 10.4% above goal <7% (worsened since last A1c) Pt at high risk of ACS event given stroke last year. No evidence of hypoglycemia.  - Reviewed long term cardiovascular and renal outcomes of uncontrolled blood sugar - Reviewed goal A1c, goal fasting, and goal 2 hour post prandial glucose - Recommend to INCREASE insulin  lispro (Humalog ) to 15-20 units with breakfast and 18-20 units with lunch and dinner - Recommend to INCREASE insulin  glargine (Basaglar ) 70-75 units daily - Recommend to continue Trulicity  (dulaglutide ) 4.5 mg weekly  - check in on UACR at f/u - Instructed patient to continue compliance with high-intensity statin--refills sent it - Recommend to check glucose fasting daily and post-prandial as he is willing. Pt would ideally resume CGM with [facility], however he reports that he does not take it with him when he leaves the house due to alarms. Pt also has not brought in his  reader to clinic when requested.   - Re-Enrolled in Basaglar , Humalog , Trulicity  patient assistance program through [facility] - obtained signature for application renewal and faxed to [employer]. Will collaborate with provider, CPhT, and patient to pursue assistance renewal. Gave patient the phone number to call and request  Follow Up Plan: PharmD 02/03/24   Mliss Tarry Griffin, PharmD, BCACP, CPP Clinical Pharmacist, [facility]

## 2024-01-13 NOTE — Telephone Encounter (Signed)
 Can we re-enroll patient in [employer]? Trulicity , basaglar  and humalog 

## 2024-01-14 DIAGNOSIS — M79674 Pain in right toe(s): Secondary | ICD-10-CM | POA: Diagnosis not present

## 2024-01-14 DIAGNOSIS — E1142 Type 2 diabetes mellitus with diabetic polyneuropathy: Secondary | ICD-10-CM | POA: Diagnosis not present

## 2024-01-14 DIAGNOSIS — L84 Corns and callosities: Secondary | ICD-10-CM | POA: Diagnosis not present

## 2024-01-14 DIAGNOSIS — M79675 Pain in left toe(s): Secondary | ICD-10-CM | POA: Diagnosis not present

## 2024-01-14 DIAGNOSIS — B351 Tinea unguium: Secondary | ICD-10-CM | POA: Diagnosis not present

## 2024-01-18 ENCOUNTER — Ambulatory Visit

## 2024-01-18 DIAGNOSIS — M62830 Muscle spasm of back: Secondary | ICD-10-CM | POA: Diagnosis not present

## 2024-01-18 DIAGNOSIS — M9904 Segmental and somatic dysfunction of sacral region: Secondary | ICD-10-CM | POA: Diagnosis not present

## 2024-01-18 DIAGNOSIS — M9903 Segmental and somatic dysfunction of lumbar region: Secondary | ICD-10-CM | POA: Diagnosis not present

## 2024-01-18 DIAGNOSIS — R2681 Unsteadiness on feet: Secondary | ICD-10-CM | POA: Diagnosis not present

## 2024-01-18 DIAGNOSIS — R29818 Other symptoms and signs involving the nervous system: Secondary | ICD-10-CM

## 2024-01-18 DIAGNOSIS — M9905 Segmental and somatic dysfunction of pelvic region: Secondary | ICD-10-CM | POA: Diagnosis not present

## 2024-01-18 DIAGNOSIS — M6281 Muscle weakness (generalized): Secondary | ICD-10-CM

## 2024-01-18 NOTE — Therapy (Signed)
 OUTPATIENT PHYSICAL THERAPY LOWER EXTREMITY TREATMENT   Patient Name: Randy [name] MRN: [id number] DOB:02-23-1951, 73 y.o., male Today's Date: 01/18/2024  END OF SESSION:  PT End of Session - 01/18/24 1559     Visit Number 2    Number of Visits 12    Date for PT Re-Evaluation 04/08/24    PT Start Time 1554    PT Stop Time 1635    PT Time Calculation (min) 41 min    Activity Tolerance Patient tolerated treatment well    Behavior During Therapy WFL for tasks assessed/performed          Past Medical History:  Diagnosis Date   Diabetes mellitus without complication (HCC)    Hyperlipidemia    Hypertension    Past Surgical History:  Procedure Laterality Date   HEMORRHOID SURGERY N/A 06/08/2013   Procedure: HEMORRHOIDECTOMY;  Surgeon: Bernarda Ned, MD;  Location: [facility];  Service: General;  Laterality: N/A;   NO PAST SURGERIES     Patient Active Problem List   Diagnosis Date Noted   Hypokalemia 11/05/2022   CVA (cerebral vascular accident) (HCC) 11/04/2022   GAD (generalized anxiety disorder) 09/19/2020   Atherosclerosis of native arteries of extremity with intermittent claudication (HCC) 10/21/2017   Anaphylactic syndrome 10/21/2016   Current smoker 06/04/2016   Depression, recurrent (HCC) 06/04/2016   Type 2 diabetes mellitus with other specified complication (HCC) 05/09/2015   Essential hypertension, benign 05/09/2015   Mixed hyperlipidemia 05/09/2015   REFERRING PROVIDER: Dettinger, Fonda LABOR, MD   REFERRING DIAG: Weakness of both lower extremities, At moderate risk for fall   THERAPY DIAG:  Unsteadiness on feet  Muscle weakness (generalized)  Other symptoms and signs involving the nervous system  Rationale for Evaluation and Treatment: Rehabilitation  ONSET DATE: 1 year ago  SUBJECTIVE:   SUBJECTIVE STATEMENT:   PERTINENT HISTORY: COPD, hypertension, type 2 diabetes, history of a CVA, depression, and anxiety PAIN:  Are you  having pain? Yes: NPRS scale: 5-6/10 Pain location: posterior aspect of both legs Pain description: sore  PRECAUTIONS: None  RED FLAGS: None   WEIGHT BEARING RESTRICTIONS: No  FALLS:  Has patient fallen in last 6 months? No  LIVING ENVIRONMENT: Lives with: lives alone Lives in: House/apartment Stairs: Yes: External: 1 steps; none Has following equipment at home: None  OCCUPATION: Conservation officer, [employer]; part time  PLOF: Independent  PATIENT GOALS: improved mobility and be able to mow his yard (riding lawnmower)   NEXT MD VISIT: 03/23/24  OBJECTIVE:  Note: Objective measures were completed at Evaluation unless otherwise noted.  COGNITION: Overall cognitive status: Within functional limits for tasks assessed     SENSATION: Patient reports no numbness or tingling  LOWER EXTREMITY ROM: WFL for activities assessed  LOWER EXTREMITY MMT:  MMT Right eval Left eval  Hip flexion 4/5 4-/5  Hip extension    Hip abduction    Hip adduction    Hip internal rotation    Hip external rotation    Knee flexion 4-/5 4/5  Knee extension 4/5 4-/5  Ankle dorsiflexion 3+/5 3+/5  Ankle plantarflexion    Ankle inversion    Ankle eversion     (Blank rows = not tested)  FUNCTIONAL TESTS:  5 times sit to stand: 22.74 seconds with infrequent UE support from armrests Timed up and go (TUG): 15.11 seconds Sit to stand transfer: infrequent posterior loss of balance and required close supervision  GAIT: Assistive device utilized: None Level of assistance: Complete Independence Comments: decreased stride  length and poor foot clearance bilaterally  Gait speed: 0.78 m/s                                                                                                                                 TREATMENT DATE:   01/18/2024                                    EXERCISE LOG  Exercise Repetitions and Resistance Comments  Nustep L1 x   Seated Marching 2x45s 2#   LAQ 2x60s 2#   HS curl  2x15  red tb   Resisted dorsiflexion  2x45s ea  w/ red tb   Hip add iso  2 min w/ball   Hip abd  2 min w/ red tb    Static balance 2 min on airex pad Easy   Modified tandem balance 2 min ea on airex pad    Tandem stance 60s ea on airex pad UE support required  Toe taps 60s to 4 box   Rocker board 2 min    Toe taps to cone 2 min    Blank cell = exercise not performed today   PATIENT EDUCATION:  Education details: Plan of care, prognosis, objective findings, and goals for physical therapy Person educated: Patient Education method: Explanation Education comprehension: verbalized understanding  HOME EXERCISE PROGRAM:   ASSESSMENT:  CLINICAL IMPRESSION:  Patient presents for visit 2 today denying any pain and doing well. Initiated session on the Nustep, however patient couldn't complete the entire 10 minutes due to fatigue after 6 minutes today. Initiated LE strengthening today with seated exercises due to patient fatigue and tolerance. Initiated balance training today with static balance on airex pad in different positions. Patient required SBA for all standing activities today and bilateral UE support for tandem stance on foam pad for safety. Plan to progress strengthening exercises to standing as tolerated in future sessions. Plan to add dynamic balance as patient progresses. Patient will benefit from continued skilled physical therapy to address impairments below.   OBJECTIVE IMPAIRMENTS: Abnormal gait, decreased activity tolerance, decreased balance, decreased mobility, difficulty walking, decreased strength, and pain.   ACTIVITY LIMITATIONS: standing, transfers, and locomotion level  PARTICIPATION LIMITATIONS: shopping, community activity, and yard work  PERSONAL FACTORS: Past/current experiences, Time since onset of injury/illness/exacerbation, and 3+ comorbidities: COPD, hypertension, type 2 diabetes, history of a CVA, depression, and anxiety are also affecting patient's functional  outcome.   REHAB POTENTIAL: Good  CLINICAL DECISION MAKING: Evolving/moderate complexity  EVALUATION COMPLEXITY: Moderate   GOALS: Goals reviewed with patient? Yes  SHORT TERM GOALS: Target date: 02/03/24 Patient will be independent with his initial HEP. Baseline: Goal status: INITIAL  2.  Patient will improve his timed up and go time to 12 seconds or less to reduce his fall risk. Baseline:  Goal status: INITIAL  3.  Patient  will improve his 5 times sit to stand time to 17 seconds or less for improved lower extremity power. Baseline:  Goal status: INITIAL  LONG TERM GOALS: Target date: 02/24/24  Patient will be independent with his advanced HEP. Baseline:  Goal status: INITIAL  2.  Patient will improve his gait speed to at least 1.0 m/s for improved community mobility. Baseline:  Goal status: INITIAL  3.  Patient will be able to transfer from sitting to standing with minimal to no difficulty for improved improved independence. Baseline:  Goal status: INITIAL  4.  Patient will improve his 5 times sit to stand time to 12 seconds or less to reduce his fall risk. Baseline:  Goal status: INITIAL  PLAN:  PT FREQUENCY: 2x/week  PT DURATION: 6 weeks  PLANNED INTERVENTIONS: 02835- PT Re-evaluation, 97750- Physical Performance Testing, 97110-Therapeutic exercises, 97530- Therapeutic activity, 97112- Neuromuscular re-education, 97535- Self Care, 02859- Manual therapy, [phone]- Gait training, Patient/Family education, Balance training, and Stair training  PLAN FOR NEXT SESSION: NuStep, lower extremity strengthening, gait training, and balance interventions   Estefana Jude, Student-PT 01/18/2024, 4:43 PM

## 2024-01-20 ENCOUNTER — Ambulatory Visit: Admitting: *Deleted

## 2024-01-20 ENCOUNTER — Encounter: Payer: Self-pay | Admitting: *Deleted

## 2024-01-20 DIAGNOSIS — M6281 Muscle weakness (generalized): Secondary | ICD-10-CM

## 2024-01-20 DIAGNOSIS — R2681 Unsteadiness on feet: Secondary | ICD-10-CM | POA: Diagnosis not present

## 2024-01-20 DIAGNOSIS — R29818 Other symptoms and signs involving the nervous system: Secondary | ICD-10-CM

## 2024-01-20 NOTE — Therapy (Signed)
 OUTPATIENT PHYSICAL THERAPY LOWER EXTREMITY TREATMENT   Patient Name: Randy [name] MRN: [id number] DOB:09/19/1950, 73 y.o., male Today's Date: 01/20/2024  END OF SESSION:  PT End of Session - 01/20/24 0935     Visit Number 3    Number of Visits 12    Date for PT Re-Evaluation 04/08/24    PT Start Time 0930    PT Stop Time 1016    PT Time Calculation (min) 46 min          Past Medical History:  Diagnosis Date   Diabetes mellitus without complication (HCC)    Hyperlipidemia    Hypertension    Past Surgical History:  Procedure Laterality Date   HEMORRHOID SURGERY N/A 06/08/2013   Procedure: HEMORRHOIDECTOMY;  Surgeon: Bernarda Ned, MD;  Location: [facility];  Service: General;  Laterality: N/A;   NO PAST SURGERIES     Patient Active Problem List   Diagnosis Date Noted   Hypokalemia 11/05/2022   CVA (cerebral vascular accident) (HCC) 11/04/2022   GAD (generalized anxiety disorder) 09/19/2020   Atherosclerosis of native arteries of extremity with intermittent claudication (HCC) 10/21/2017   Anaphylactic syndrome 10/21/2016   Current smoker 06/04/2016   Depression, recurrent (HCC) 06/04/2016   Type 2 diabetes mellitus with other specified complication (HCC) 05/09/2015   Essential hypertension, benign 05/09/2015   Mixed hyperlipidemia 05/09/2015   REFERRING PROVIDER: Dettinger, Fonda LABOR, MD   REFERRING DIAG: Weakness of both lower extremities, At moderate risk for fall   THERAPY DIAG:  Unsteadiness on feet  Muscle weakness (generalized)  Other symptoms and signs involving the nervous system  Rationale for Evaluation and Treatment: Rehabilitation  ONSET DATE: 1 year ago  SUBJECTIVE:   SUBJECTIVE STATEMENT: Doing better today   PERTINENT HISTORY: COPD, hypertension, type 2 diabetes, history of a CVA, depression, and anxiety PAIN:  Are you having pain? Yes: NPRS scale: 5-6/10 Pain location: posterior aspect of both legs Pain  description: sore  PRECAUTIONS: None  RED FLAGS: None   WEIGHT BEARING RESTRICTIONS: No  FALLS:  Has patient fallen in last 6 months? No  LIVING ENVIRONMENT: Lives with: lives alone Lives in: House/apartment Stairs: Yes: External: 1 steps; none Has following equipment at home: None  OCCUPATION: Conservation officer, nature; part time  PLOF: Independent  PATIENT GOALS: improved mobility and be able to mow his yard (riding lawnmower)   NEXT MD VISIT: 03/23/24  OBJECTIVE:  Note: Objective measures were completed at Evaluation unless otherwise noted.  COGNITION: Overall cognitive status: Within functional limits for tasks assessed     SENSATION: Patient reports no numbness or tingling  LOWER EXTREMITY ROM: WFL for activities assessed  LOWER EXTREMITY MMT:  MMT Right eval Left eval  Hip flexion 4/5 4-/5  Hip extension    Hip abduction    Hip adduction    Hip internal rotation    Hip external rotation    Knee flexion 4-/5 4/5  Knee extension 4/5 4-/5  Ankle dorsiflexion 3+/5 3+/5  Ankle plantarflexion    Ankle inversion    Ankle eversion     (Blank rows = not tested)  FUNCTIONAL TESTS:  5 times sit to stand: 22.74 seconds with infrequent UE support from armrests Timed up and go (TUG): 15.11 seconds Sit to stand transfer: infrequent posterior loss of balance and required close supervision  GAIT: Assistive device utilized: None Level of assistance: Complete Independence Comments: decreased stride length and poor foot clearance bilaterally  Gait speed: 0.78 m/s  TREATMENT DATE:   01/18/2024                                    EXERCISE LOG   Balance/ conditioning  Exercise Repetitions and Resistance Comments  Nustep L3   x 12    mins   Seated Marching 3x 10 each 2#   LAQ 3 x 10 each 2#   HS curl  2x15 red tb   Resisted dorsiflexion     Hip add  iso     Hip abd  2 min w/ red tb    Static balance with UE reaches 2 x 20 on airex pad Easy   Modified tandem balance 2 min ea on airex pad      UE support required  Toe taps 2x 20 to 4 box   Rocker board 3 min    Tandem stance 2 min    Blank cell = exercise not performed today   PATIENT EDUCATION:  Education details: Plan of care, prognosis, objective findings, and goals for physical therapy Person educated: Patient Education method: Explanation Education comprehension: verbalized understanding  HOME EXERCISE PROGRAM:   ASSESSMENT:  CLINICAL IMPRESSION:  Patient  arrived today doing a little better with more energy. He was able to double his time on the nustep today and continue with other exs. Still challenged with balance act's and needs SBA. Mainly fatigued end of session.  OBJECTIVE IMPAIRMENTS: Abnormal gait, decreased activity tolerance, decreased balance, decreased mobility, difficulty walking, decreased strength, and pain.   ACTIVITY LIMITATIONS: standing, transfers, and locomotion level  PARTICIPATION LIMITATIONS: shopping, community activity, and yard work  PERSONAL FACTORS: Past/current experiences, Time since onset of injury/illness/exacerbation, and 3+ comorbidities: COPD, hypertension, type 2 diabetes, history of a CVA, depression, and anxiety are also affecting patient's functional outcome.   REHAB POTENTIAL: Good  CLINICAL DECISION MAKING: Evolving/moderate complexity  EVALUATION COMPLEXITY: Moderate   GOALS: Goals reviewed with patient? Yes  SHORT TERM GOALS: Target date: 02/03/24 Patient will be independent with his initial HEP. Baseline: Goal status: INITIAL  2.  Patient will improve his timed up and go time to 12 seconds or less to reduce his fall risk. Baseline:  Goal status: INITIAL  3.  Patient will improve his 5 times sit to stand time to 17 seconds or less for improved lower extremity power. Baseline:  Goal status: INITIAL  LONG TERM  GOALS: Target date: 02/24/24  Patient will be independent with his advanced HEP. Baseline:  Goal status: INITIAL  2.  Patient will improve his gait speed to at least 1.0 m/s for improved community mobility. Baseline:  Goal status: INITIAL  3.  Patient will be able to transfer from sitting to standing with minimal to no difficulty for improved improved independence. Baseline:  Goal status: INITIAL  4.  Patient will improve his 5 times sit to stand time to 12 seconds or less to reduce his fall risk. Baseline:  Goal status: INITIAL  PLAN:  PT FREQUENCY: 2x/week  PT DURATION: 6 weeks  PLANNED INTERVENTIONS: 02835- PT Re-evaluation, 97750- Physical Performance Testing, 97110-Therapeutic exercises, 97530- Therapeutic activity, [id number]- Neuromuscular re-education, 97535- Self Care, 02859- Manual therapy, [phone]- Gait training, Patient/Family education, Balance training, and Stair training  PLAN FOR NEXT SESSION: NuStep, lower extremity strengthening, gait training, and balance interventions   Surina Storts,CHRIS, PTA 01/20/2024, 10:30 AM

## 2024-01-27 ENCOUNTER — Ambulatory Visit

## 2024-01-27 DIAGNOSIS — M6281 Muscle weakness (generalized): Secondary | ICD-10-CM

## 2024-01-27 DIAGNOSIS — R2681 Unsteadiness on feet: Secondary | ICD-10-CM | POA: Diagnosis not present

## 2024-01-27 DIAGNOSIS — R29818 Other symptoms and signs involving the nervous system: Secondary | ICD-10-CM

## 2024-01-27 MED ORDER — ROSUVASTATIN CALCIUM 40 MG PO TABS
40.0000 mg | ORAL_TABLET | Freq: Every day | ORAL | 3 refills | Status: AC
Start: 2024-01-27 — End: ?

## 2024-01-27 NOTE — Therapy (Signed)
 OUTPATIENT PHYSICAL THERAPY LOWER EXTREMITY TREATMENT   Patient Name: Randy [name] MRN: [id number] DOB:1950/06/21, 73 y.o., male Today's Date: 01/27/2024  END OF SESSION:  PT End of Session - 01/27/24 0850     Visit Number 4    Number of Visits 12    Date for PT Re-Evaluation 04/08/24    PT Start Time 0845    PT Stop Time 0930    PT Time Calculation (min) 45 min          Past Medical History:  Diagnosis Date   Diabetes mellitus without complication (HCC)    Hyperlipidemia    Hypertension    Past Surgical History:  Procedure Laterality Date   HEMORRHOID SURGERY N/A 06/08/2013   Procedure: HEMORRHOIDECTOMY;  Surgeon: Bernarda Ned, MD;  Location: [facility] [place];  Service: General;  Laterality: N/A;   NO PAST SURGERIES     Patient Active Problem List   Diagnosis Date Noted   Hypokalemia 11/05/2022   CVA (cerebral vascular accident) (HCC) 11/04/2022   GAD (generalized anxiety disorder) 09/19/2020   Atherosclerosis of native arteries of extremity with intermittent claudication (HCC) 10/21/2017   Anaphylactic syndrome 10/21/2016   Current smoker 06/04/2016   Depression, recurrent (HCC) 06/04/2016   Type 2 diabetes mellitus with other specified complication (HCC) 05/09/2015   Essential hypertension, benign 05/09/2015   Mixed hyperlipidemia 05/09/2015   REFERRING PROVIDER: Dettinger, Fonda LABOR, MD   REFERRING DIAG: Weakness of both lower extremities, At moderate risk for fall   THERAPY DIAG:  Unsteadiness on feet  Muscle weakness (generalized)  Other symptoms and signs involving the nervous system  Rationale for Evaluation and Treatment: Rehabilitation  ONSET DATE: 1 year ago  SUBJECTIVE:   SUBJECTIVE STATEMENT:   Pt reports 5-6/10 bil leg pain which is pretty typical.   PERTINENT HISTORY: COPD, hypertension, type 2 diabetes, history of a CVA, depression, and anxiety PAIN:  Are you having pain? Yes: NPRS scale: 5-6/10 Pain location:  posterior aspect of both legs Pain description: sore  PRECAUTIONS: None  RED FLAGS: None   WEIGHT BEARING RESTRICTIONS: No  FALLS:  Has patient fallen in last 6 months? No  LIVING ENVIRONMENT: Lives with: lives alone Lives in: House/apartment Stairs: Yes: External: 1 steps; none Has following equipment at home: None  OCCUPATION: Conservation officer, [employer]; part time  PLOF: Independent  PATIENT GOALS: improved mobility and be able to mow his yard (riding lawnmower)   NEXT MD VISIT: 03/23/24  OBJECTIVE:  Note: Objective measures were completed at Evaluation unless otherwise noted.  COGNITION: Overall cognitive status: Within functional limits for tasks assessed     SENSATION: Patient reports no numbness or tingling  LOWER EXTREMITY ROM: WFL for activities assessed  LOWER EXTREMITY MMT:  MMT Right eval Left eval  Hip flexion 4/5 4-/5  Hip extension    Hip abduction    Hip adduction    Hip internal rotation    Hip external rotation    Knee flexion 4-/5 4/5  Knee extension 4/5 4-/5  Ankle dorsiflexion 3+/5 3+/5  Ankle plantarflexion    Ankle inversion    Ankle eversion     (Blank rows = not tested)  FUNCTIONAL TESTS:  5 times sit to stand: 22.74 seconds with infrequent UE support from armrests Timed up and go (TUG): 15.11 seconds Sit to stand transfer: infrequent posterior loss of balance and required close supervision  GAIT: Assistive device utilized: None Level of assistance: Complete Independence Comments: decreased stride length and poor foot clearance bilaterally  Gait speed: 0.78 m/s                                                                                                                                 TREATMENT DATE:    01/27/2024                                   EXERCISE LOG   Balance/ conditioning  Exercise Repetitions and Resistance Comments  Nustep L3 x 15 mins   Seated Marching 3# 2 sets of 15 reps   LAQ 3# 2 sets of 15 reps   HS curl  2 x 20  red    Resisted dorsiflexion     Hip add iso     Hip abd  2.5 min w/ red    Static balance with UE reaches  Easy   Modified tandem balance    Forward Step Ups  UE support required  Side-stepping Airex x 3 mins   Tandem Gait Airex x 3 mins   Toe taps 2x 20 to 6 box   Rocker board 4 min    Tandem stance     Blank cell = exercise not performed today   PATIENT EDUCATION:  Education details: Plan of care, prognosis, objective findings, and goals for physical therapy Person educated: Patient Education method: Explanation Education comprehension: verbalized understanding  HOME EXERCISE PROGRAM:   ASSESSMENT:  CLINICAL IMPRESSION: Pt arrives for today's treatment session reporting 5-6/10 bil LE pain.  Pt introduced to balance exercises today on the Airex balance beam.  Pt requiring BUE support with all balance beam activities.  Pt able to perform toe taps on higher step and with single UE support.  Pt able to tolerate increased reps or resistance with all previously performed seated exercises.  Pt reported minimal decreased pain at completion of today's treatment session.  OBJECTIVE IMPAIRMENTS: Abnormal gait, decreased activity tolerance, decreased balance, decreased mobility, difficulty walking, decreased strength, and pain.   ACTIVITY LIMITATIONS: standing, transfers, and locomotion level  PARTICIPATION LIMITATIONS: shopping, community activity, and yard work  PERSONAL FACTORS: Past/current experiences, Time since onset of injury/illness/exacerbation, and 3+ comorbidities: COPD, hypertension, type 2 diabetes, history of a CVA, depression, and anxiety are also affecting patient's functional outcome.   REHAB POTENTIAL: Good  CLINICAL DECISION MAKING: Evolving/moderate complexity  EVALUATION COMPLEXITY: Moderate   GOALS: Goals reviewed with patient? Yes  SHORT TERM GOALS: Target date: 02/03/24 Patient will be independent with his initial HEP. Baseline: Goal status:  INITIAL  2.  Patient will improve his timed up and go time to 12 seconds or less to reduce his fall risk. Baseline:  Goal status: INITIAL  3.  Patient will improve his 5 times sit to stand time to 17 seconds or less for improved lower extremity power. Baseline:  Goal status: INITIAL  LONG TERM GOALS: Target date: 02/24/24  Patient will  be independent with his advanced HEP. Baseline:  Goal status: INITIAL  2.  Patient will improve his gait speed to at least 1.0 m/s for improved community mobility. Baseline:  Goal status: INITIAL  3.  Patient will be able to transfer from sitting to standing with minimal to no difficulty for improved improved independence. Baseline:  Goal status: INITIAL  4.  Patient will improve his 5 times sit to stand time to 12 seconds or less to reduce his fall risk. Baseline:  Goal status: INITIAL  PLAN:  PT FREQUENCY: 2x/week  PT DURATION: 6 weeks  PLANNED INTERVENTIONS: 02835- PT Re-evaluation, 97750- Physical Performance Testing, 97110-Therapeutic exercises, 97530- Therapeutic activity, 97112- Neuromuscular re-education, 97535- Self Care, 02859- Manual therapy, [phone]- Gait training, Patient/Family education, Balance training, and Stair training  PLAN FOR NEXT SESSION: NuStep, lower extremity strengthening, gait training, and balance interventions   Delon DELENA Gosling, PTA 01/27/2024, 9:58 AM

## 2024-02-01 DIAGNOSIS — M9904 Segmental and somatic dysfunction of sacral region: Secondary | ICD-10-CM | POA: Diagnosis not present

## 2024-02-01 DIAGNOSIS — M62830 Muscle spasm of back: Secondary | ICD-10-CM | POA: Diagnosis not present

## 2024-02-01 DIAGNOSIS — M9905 Segmental and somatic dysfunction of pelvic region: Secondary | ICD-10-CM | POA: Diagnosis not present

## 2024-02-01 DIAGNOSIS — M9903 Segmental and somatic dysfunction of lumbar region: Secondary | ICD-10-CM | POA: Diagnosis not present

## 2024-02-03 ENCOUNTER — Ambulatory Visit: Admitting: Physical Therapy

## 2024-02-03 ENCOUNTER — Other Ambulatory Visit (INDEPENDENT_AMBULATORY_CARE_PROVIDER_SITE_OTHER)

## 2024-02-03 DIAGNOSIS — Z794 Long term (current) use of insulin: Secondary | ICD-10-CM

## 2024-02-03 DIAGNOSIS — M6281 Muscle weakness (generalized): Secondary | ICD-10-CM

## 2024-02-03 DIAGNOSIS — E119 Type 2 diabetes mellitus without complications: Secondary | ICD-10-CM

## 2024-02-03 DIAGNOSIS — R2681 Unsteadiness on feet: Secondary | ICD-10-CM

## 2024-02-03 DIAGNOSIS — Z7985 Long-term (current) use of injectable non-insulin antidiabetic drugs: Secondary | ICD-10-CM

## 2024-02-03 NOTE — Progress Notes (Signed)
 02/03/2024 Name: Randy [name] MRN: [id number] DOB: 01/20/1951  Chief Complaint  Patient presents with   Diabetes    Randy [name] is a 73 y.o. year old male who was referred for medication management by their primary care provider, Dettinger, Fonda LABOR, MD. I connected with  Kayla KANDICE Wessman on 02/03/24 by telephone and verified that I am speaking with the correct person using two identifiers. I discussed the limitations of evaluation and management by telemedicine. The patient expressed understanding and agreed to proceed.  Patient was located in her home and PharmD in PCP office during this visit.  They were referred to the pharmacist by their PCP for assistance in managing diabetes   Subjective: Patient is having difficulties getting medications via [employer] patient assistance program.  He has previously been enrolled in [employer] PAP for insulins and trulicity .  He has used Libre 3 CGM (with reader) to monitor blood sugar in the past, but the reader stopped working. Denies having complaints or concerns with his current medication regimen.   Care Team: Primary Care Provider: Dettinger, Fonda LABOR, MD ; Next Scheduled Visit: 05/20/23  Medication Access/Adherence  Current Pharmacy:  [facility] [place], [place] - [address] [place] [address] Phone: [phone] Fax: [phone]  [facility] [address] [place] - [address] [place] [address] [place] [address] Phone: [phone] Fax: [phone]  [facility] [place] [facility] [place], [place] - [address] [place] [address] Phone: [phone] Fax: [phone]  Patient reports affordability concerns with their medications: No -now enrolled in PAP Patient reports access/transportation concerns to their pharmacy: No  Patient reports adherence concerns with their medications:  No    Diabetes:  Current medications: Basaglar   (insulin  glargine) 75 units daily, Humalog  (insulin  lispro) 15 units with meals, Trulicity  (dulaglutide ) 4.5 mg weekly. Medications tried in the past: Farxiga  (dapagliflozin )- frequent urination- not willing to retrial.  Notices that Trulicity  needle is usually bent when he removes it. Occasional bruising.   Current glucose readings: not checking post-prandially  PAST CGM experience:  Has FL3 sensor and reader at home. Reader stopped working after he went through [employer] with sensor. He did call [employer] to request a new reader and they sent him replacement sensors. At last appt, pt stated that he would try another sensor - but today he reports that he was not going to do this.   Patient denies hypoglycemic s/sx including dizziness, shakiness, sweating. Patient denies hyperglycemic symptoms including polyuria, polydipsia, polyphagia, nocturia, neuropathy, blurred vision.  Current meal patterns: 3 meals/day - usually eating at restaurants. Likes green beans, pinto beans, corn, cabbage, cornbread. - Drinks diet mountain dew, one bottle water/day.   Physical activity: currently limited by back pain   Objective:  Lab Results  Component Value Date   HGBA1C 10.4 (H) 12/23/2023    Lab Results  Component Value Date   CREATININE 1.16 05/20/2023   BUN 18 05/20/2023   NA 140 05/20/2023   K 3.8 05/20/2023   CL 97 05/20/2023   CO2 26 05/20/2023    Lab Results  Component Value Date   CHOL 149 05/20/2023   HDL 44 05/20/2023   LDLCALC 83 05/20/2023   TRIG 123 05/20/2023   CHOLHDL 3.4 05/20/2023    Medications Reviewed Today   Medications were not reviewed in this encounter     Assessment/Plan:   Diabetes: - Currently  uncontrolled with most recent A1c of 10.4% above goal <7% (worsened since last A1c) Pt at high risk of ACS event given stroke last year. No evidence of hypoglycemia.  - Reviewed long term cardiovascular and renal outcomes of uncontrolled blood sugar - Reviewed  goal A1c, goal fasting, and goal 2 hour post prandial glucose - Recommend to INCREASE insulin  lispro (Humalog ) to 15-20 units with breakfast and 18-20 units with lunch and dinner - Recommend to INCREASE insulin  glargine (Basaglar ) 70-75 units daily - Recommend to continue Trulicity  (dulaglutide ) 4.5 mg weekly  - check in on UACR at f/u - Instructed patient to continue compliance with high-intensity statin--refills sent it - Recommend to check glucose fasting daily and post-prandial as he is willing. Pt would ideally resume CGM with [facility], however he reports that he does not take it with him when he leaves the house due to alarms. Pt also has not brought in his reader to clinic when requested.   - Re-Enrolled in Basaglar , Humalog , Trulicity  patient assistance program through [facility] - obtained signature for application renewal and faxed to [employer]. Will collaborate with provider, CPhT, and patient to pursue assistance renewal. Gave patient the phone number to call and request  Follow Up Plan: PharmD 02/25/24   Mliss Tarry Griffin, PharmD, BCACP, CPP Clinical Pharmacist, [facility]

## 2024-02-03 NOTE — Therapy (Signed)
 OUTPATIENT PHYSICAL THERAPY LOWER EXTREMITY TREATMENT   Patient Name: Randy [name] MRN: [id number] DOB:1950/08/11, 73 y.o., male Today's Date: 02/03/2024  END OF SESSION:  PT End of Session - 02/03/24 0938     Visit Number 5    Number of Visits 12    Date for PT Re-Evaluation 04/08/24    PT Start Time 0930    PT Stop Time 1013    PT Time Calculation (min) 43 min    Activity Tolerance Patient tolerated treatment well    Behavior During Therapy WFL for tasks assessed/performed           Past Medical History:  Diagnosis Date   Diabetes mellitus without complication (HCC)    Hyperlipidemia    Hypertension    Past Surgical History:  Procedure Laterality Date   HEMORRHOID SURGERY N/A 06/08/2013   Procedure: HEMORRHOIDECTOMY;  Surgeon: Bernarda Ned, MD;  Location: [facility];  Service: General;  Laterality: N/A;   NO PAST SURGERIES     Patient Active Problem List   Diagnosis Date Noted   Hypokalemia 11/05/2022   CVA (cerebral vascular accident) (HCC) 11/04/2022   GAD (generalized anxiety disorder) 09/19/2020   Atherosclerosis of native arteries of extremity with intermittent claudication (HCC) 10/21/2017   Anaphylactic syndrome 10/21/2016   Current smoker 06/04/2016   Depression, recurrent (HCC) 06/04/2016   Type 2 diabetes mellitus with other specified complication (HCC) 05/09/2015   Essential hypertension, benign 05/09/2015   Mixed hyperlipidemia 05/09/2015   REFERRING PROVIDER: Dettinger, Fonda LABOR, MD   REFERRING DIAG: Weakness of both lower extremities, At moderate risk for fall   THERAPY DIAG:  Unsteadiness on feet  Muscle weakness (generalized)  Rationale for Evaluation and Treatment: Rehabilitation  ONSET DATE: 1 year ago  SUBJECTIVE:   SUBJECTIVE STATEMENT:   No new complaints.  Discussed cane usage for additional safety.  PERTINENT HISTORY: COPD, hypertension, type 2 diabetes, history of a CVA, depression, and anxiety PAIN:   Are you having pain? Yes: NPRS scale: 5-6/10 Pain location: posterior aspect of both legs Pain description: sore  PRECAUTIONS: None  RED FLAGS: None   WEIGHT BEARING RESTRICTIONS: No  FALLS:  Has patient fallen in last 6 months? No  LIVING ENVIRONMENT: Lives with: lives alone Lives in: House/apartment Stairs: Yes: External: 1 steps; none Has following equipment at home: None  OCCUPATION: Conservation officer, [employer]; part time  PLOF: Independent  PATIENT GOALS: improved mobility and be able to mow his yard (riding lawnmower)   NEXT MD VISIT: 03/23/24  OBJECTIVE:  Note: Objective measures were completed at Evaluation unless otherwise noted.  COGNITION: Overall cognitive status: Within functional limits for tasks assessed     SENSATION: Patient reports no numbness or tingling  LOWER EXTREMITY ROM: WFL for activities assessed  LOWER EXTREMITY MMT:  MMT Right eval Left eval  Hip flexion 4/5 4-/5  Hip extension    Hip abduction    Hip adduction    Hip internal rotation    Hip external rotation    Knee flexion 4-/5 4/5  Knee extension 4/5 4-/5  Ankle dorsiflexion 3+/5 3+/5  Ankle plantarflexion    Ankle inversion    Ankle eversion     (Blank rows = not tested)  FUNCTIONAL TESTS:  5 times sit to stand: 22.74 seconds with infrequent UE support from armrests Timed up and go (TUG): 15.11 seconds Sit to stand transfer: infrequent posterior loss of balance and required close supervision  GAIT: Assistive device utilized: None Level of assistance: Complete Independence  Comments: decreased stride length and poor foot clearance bilaterally  Gait speed: 0.78 m/s                                                                                                                                 TREATMENT DATE:    02/03/24:                                       EXERCISE LOG  Exercise Repetitions and Resistance Comments  Nustep Level 4 x 15 minutes   Rockerboard  5 minutes in  parallel bars   Airex balance pad 3 minutes in parallel bars ball tosses.   Airex balance beam 3 minutes in parallel bars    Seated hip abduction Red theraband x 3 minutes    Seated hip adduction Ball squeezes x 3 minutes    4# LAQ's 2 fatigue x 2    4# Marching  2 fatigue x 2     01/27/2024                                   EXERCISE LOG   Balance/ conditioning  Exercise Repetitions and Resistance Comments  Nustep L3 x 15 mins   Seated Marching 3# 2 sets of 15 reps   LAQ 3# 2 sets of 15 reps   HS curl  2 x 20 red    Resisted dorsiflexion     Hip add iso     Hip abd  2.5 min w/ red    Static balance with UE reaches  Easy   Modified tandem balance    Forward Step Ups  UE support required  Side-stepping Airex x 3 mins   Tandem Gait Airex x 3 mins   Toe taps 2x 20 to 6 box   Rocker board 4 min    Tandem stance     Blank cell = exercise not performed today   PATIENT EDUCATION:  Education details: Plan of care, prognosis, objective findings, and goals for physical therapy Person educated: Patient Education method: Explanation Education comprehension: verbalized understanding  HOME EXERCISE PROGRAM:   ASSESSMENT:  CLINICAL IMPRESSION: Patient is very motivated and did well with all interventions today.  Recommended cane usage for added safety.  OBJECTIVE IMPAIRMENTS: Abnormal gait, decreased activity tolerance, decreased balance, decreased mobility, difficulty walking, decreased strength, and pain.   ACTIVITY LIMITATIONS: standing, transfers, and locomotion level  PARTICIPATION LIMITATIONS: shopping, community activity, and yard work  PERSONAL FACTORS: Past/current experiences, Time since onset of injury/illness/exacerbation, and 3+ comorbidities: COPD, hypertension, type 2 diabetes, history of a CVA, depression, and anxiety are also affecting patient's functional outcome.   REHAB POTENTIAL: Good  CLINICAL DECISION MAKING: Evolving/moderate complexity  EVALUATION  COMPLEXITY: Moderate   GOALS: Goals reviewed with patient? Yes  SHORT TERM GOALS: Target date: 02/03/24 Patient will be independent with his initial HEP. Baseline: Goal status: INITIAL  2.  Patient will improve his timed up and go time to 12 seconds or less to reduce his fall risk. Baseline:  Goal status: INITIAL  3.  Patient will improve his 5 times sit to stand time to 17 seconds or less for improved lower extremity power. Baseline:  Goal status: INITIAL  LONG TERM GOALS: Target date: 02/24/24  Patient will be independent with his advanced HEP. Baseline:  Goal status: INITIAL  2.  Patient will improve his gait speed to at least 1.0 m/s for improved community mobility. Baseline:  Goal status: INITIAL  3.  Patient will be able to transfer from sitting to standing with minimal to no difficulty for improved improved independence. Baseline:  Goal status: INITIAL  4.  Patient will improve his 5 times sit to stand time to 12 seconds or less to reduce his fall risk. Baseline:  Goal status: INITIAL  PLAN:  PT FREQUENCY: 2x/week  PT DURATION: 6 weeks  PLANNED INTERVENTIONS: 02835- PT Re-evaluation, 97750- Physical Performance Testing, 97110-Therapeutic exercises, 97530- Therapeutic activity, [id number]- Neuromuscular re-education, 97535- Self Care, 02859- Manual therapy, [phone]- Gait training, Patient/Family education, Balance training, and Stair training  PLAN FOR NEXT SESSION: NuStep, lower extremity strengthening, gait training, and balance interventions   Exie Chrismer, [place], PT 02/03/2024, 10:42 AM

## 2024-02-10 ENCOUNTER — Ambulatory Visit: Attending: Family Medicine

## 2024-02-10 DIAGNOSIS — R2681 Unsteadiness on feet: Secondary | ICD-10-CM | POA: Diagnosis not present

## 2024-02-10 DIAGNOSIS — M6281 Muscle weakness (generalized): Secondary | ICD-10-CM | POA: Diagnosis not present

## 2024-02-10 DIAGNOSIS — R29818 Other symptoms and signs involving the nervous system: Secondary | ICD-10-CM | POA: Insufficient documentation

## 2024-02-10 NOTE — Therapy (Signed)
 OUTPATIENT PHYSICAL THERAPY LOWER EXTREMITY TREATMENT   Patient Name: MERCER PEIFER MRN: [id number] DOB:1951-03-16, 73 y.o., male Today's Date: 02/10/2024  END OF SESSION:  PT End of Session - 02/10/24 0932     Visit Number 6    Number of Visits 12    Date for PT Re-Evaluation 04/08/24    PT Start Time 0930    PT Stop Time 1017    PT Time Calculation (min) 47 min    Activity Tolerance Patient tolerated treatment well    Behavior During Therapy WFL for tasks assessed/performed           Past Medical History:  Diagnosis Date   Diabetes mellitus without complication (HCC)    Hyperlipidemia    Hypertension    Past Surgical History:  Procedure Laterality Date   HEMORRHOID SURGERY N/A 06/08/2013   Procedure: HEMORRHOIDECTOMY;  Surgeon: Bernarda Ned, MD;  Location: [facility];  Service: General;  Laterality: N/A;   NO PAST SURGERIES     Patient Active Problem List   Diagnosis Date Noted   Hypokalemia 11/05/2022   CVA (cerebral vascular accident) (HCC) 11/04/2022   GAD (generalized anxiety disorder) 09/19/2020   Atherosclerosis of native arteries of extremity with intermittent claudication (HCC) 10/21/2017   Anaphylactic syndrome 10/21/2016   Current smoker 06/04/2016   Depression, recurrent (HCC) 06/04/2016   Type 2 diabetes mellitus with other specified complication (HCC) 05/09/2015   Essential hypertension, benign 05/09/2015   Mixed hyperlipidemia 05/09/2015   REFERRING PROVIDER: Dettinger, Fonda LABOR, MD   REFERRING DIAG: Weakness of both lower extremities, At moderate risk for fall   THERAPY DIAG:  Unsteadiness on feet  Muscle weakness (generalized)  Rationale for Evaluation and Treatment: Rehabilitation  ONSET DATE: 1 year ago  SUBJECTIVE:   SUBJECTIVE STATEMENT:   Pt reports 3/10 low back pain today.   PERTINENT HISTORY: COPD, hypertension, type 2 diabetes, history of a CVA, depression, and anxiety PAIN:  Are you having pain? Yes:  NPRS scale: 3/10 Pain location: low back Pain description: sore  PRECAUTIONS: None  RED FLAGS: None   WEIGHT BEARING RESTRICTIONS: No  FALLS:  Has patient fallen in last 6 months? No  LIVING ENVIRONMENT: Lives with: lives alone Lives in: House/apartment Stairs: Yes: External: 1 steps; none Has following equipment at home: None  OCCUPATION: Conservation officer, nature; part time  PLOF: Independent  PATIENT GOALS: improved mobility and be able to mow his yard (riding lawnmower)   NEXT MD VISIT: 03/23/24  OBJECTIVE:  Note: Objective measures were completed at Evaluation unless otherwise noted.  COGNITION: Overall cognitive status: Within functional limits for tasks assessed     SENSATION: Patient reports no numbness or tingling  LOWER EXTREMITY ROM: WFL for activities assessed  LOWER EXTREMITY MMT:  MMT Right eval Left eval  Hip flexion 4/5 4-/5  Hip extension    Hip abduction    Hip adduction    Hip internal rotation    Hip external rotation    Knee flexion 4-/5 4/5  Knee extension 4/5 4-/5  Ankle dorsiflexion 3+/5 3+/5  Ankle plantarflexion    Ankle inversion    Ankle eversion     (Blank rows = not tested)  FUNCTIONAL TESTS:  5 times sit to stand: 22.74 seconds with infrequent UE support from armrests Timed up and go (TUG): 15.11 seconds Sit to stand transfer: infrequent posterior loss of balance and required close supervision  GAIT: Assistive device utilized: None Level of assistance: Complete Independence Comments: decreased stride length and  poor foot clearance bilaterally  Gait speed: 0.78 m/s                                                                                                                                 TREATMENT DATE:    02/10/24:                          EXERCISE LOG  Exercise Repetitions and Resistance Comments  Nustep Level 4 x 15 minutes   Goal Assessment  See Below   Rockerboard  3 minutes in parallel bars   Side-Stepping Airex x 4  mins   Tandem Gait  Airex x 4 mins   Seated hip abduction    Seated hip adduction    Seated Ham Curls Green x 25 reps bil   4# LAQ's 2 fatigue x 2    4# Marching  2 fatigue x 2     01/27/2024                                   EXERCISE LOG   Balance/ conditioning  Exercise Repetitions and Resistance Comments  Nustep L3 x 15 mins   Seated Marching 3# 2 sets of 15 reps   LAQ 3# 2 sets of 15 reps   HS curl  2 x 20 red    Resisted dorsiflexion     Hip add iso     Hip abd  2.5 min w/ red    Static balance with UE reaches  Easy   Modified tandem balance    Forward Step Ups  UE support required  Side-stepping Airex x 3 mins   Tandem Gait Airex x 3 mins   Toe taps 2x 20 to 6 box   Rocker board 4 min    Tandem stance     Blank cell = exercise not performed today   PATIENT EDUCATION:  Education details: Plan of care, prognosis, objective findings, and goals for physical therapy Person educated: Patient Education method: Explanation Education comprehension: verbalized understanding  HOME EXERCISE PROGRAM:   ASSESSMENT:  CLINICAL IMPRESSION: Pt arrives for today's treatment session reporting 3/10 low back pain.  Pt able to complete TUG in 11.5 seconds and 5 STS in 16.5 seconds, meeting both of his STGs and making good progress towards his long term goals.  Pt able to perform safe and stable STS transfers independently without issue.  Pt able to tolerate increased time with balance exercises today with single UE support required for stability versus bil UE support.  Pt reported decrease in low back pain at completion of today's treatment session.  OBJECTIVE IMPAIRMENTS: Abnormal gait, decreased activity tolerance, decreased balance, decreased mobility, difficulty walking, decreased strength, and pain.   ACTIVITY LIMITATIONS: standing, transfers, and locomotion level  PARTICIPATION LIMITATIONS: shopping, community activity, and yard work  PERSONAL FACTORS:  Past/current experiences,  Time since onset of injury/illness/exacerbation, and 3+ comorbidities: COPD, hypertension, type 2 diabetes, history of a CVA, depression, and anxiety are also affecting patient's functional outcome.   REHAB POTENTIAL: Good  CLINICAL DECISION MAKING: Evolving/moderate complexity  EVALUATION COMPLEXITY: Moderate   GOALS: Goals reviewed with patient? Yes  SHORT TERM GOALS: Target date: 02/03/24 Patient will be independent with his initial HEP. Baseline: Goal status: MET  2.  Patient will improve his timed up and go time to 12 seconds or less to reduce his fall risk. Baseline: 9/3: 11.5 seconds Goal status: MET  3.  Patient will improve his 5 times sit to stand time to 17 seconds or less for improved lower extremity power. Baseline: 9/3: 16.5 seconds Goal status: MET  LONG TERM GOALS: Target date: 02/24/24  Patient will be independent with his advanced HEP. Baseline:  Goal status: IN PROGRESS  2.  Patient will improve his gait speed to at least 1.0 m/s for improved community mobility. Baseline:  Goal status: IN PROGRESS  3.  Patient will be able to transfer from sitting to standing with minimal to no difficulty for improved improved independence. Baseline:  Goal status: MET  4.  Patient will improve his 5 times sit to stand time to 12 seconds or less to reduce his fall risk. Baseline: 9/3: 16.5 seconds Goal status: IN PROGRESS  PLAN:  PT FREQUENCY: 2x/week  PT DURATION: 6 weeks  PLANNED INTERVENTIONS: 97164- PT Re-evaluation, 97750- Physical Performance Testing, 97110-Therapeutic exercises, 97530- Therapeutic activity, 97112- Neuromuscular re-education, 97535- Self Care, [id number]- Manual therapy, [phone]- Gait training, Patient/Family education, Balance training, and Stair training  PLAN FOR NEXT SESSION: NuStep, lower extremity strengthening, gait training, and balance interventions   Delon DELENA Gosling, PTA 02/10/2024, 10:22 AM

## 2024-02-15 DIAGNOSIS — M9904 Segmental and somatic dysfunction of sacral region: Secondary | ICD-10-CM | POA: Diagnosis not present

## 2024-02-15 DIAGNOSIS — M62830 Muscle spasm of back: Secondary | ICD-10-CM | POA: Diagnosis not present

## 2024-02-15 DIAGNOSIS — M9903 Segmental and somatic dysfunction of lumbar region: Secondary | ICD-10-CM | POA: Diagnosis not present

## 2024-02-15 DIAGNOSIS — M9905 Segmental and somatic dysfunction of pelvic region: Secondary | ICD-10-CM | POA: Diagnosis not present

## 2024-02-17 ENCOUNTER — Ambulatory Visit: Admitting: *Deleted

## 2024-02-17 ENCOUNTER — Encounter: Payer: Self-pay | Admitting: *Deleted

## 2024-02-17 DIAGNOSIS — R2681 Unsteadiness on feet: Secondary | ICD-10-CM | POA: Diagnosis not present

## 2024-02-17 DIAGNOSIS — R29818 Other symptoms and signs involving the nervous system: Secondary | ICD-10-CM

## 2024-02-17 DIAGNOSIS — M6281 Muscle weakness (generalized): Secondary | ICD-10-CM

## 2024-02-17 NOTE — Therapy (Signed)
 OUTPATIENT PHYSICAL THERAPY LOWER EXTREMITY TREATMENT   Patient Name: Randy [name] MRN: [id number] DOB:November 29, 1950, 73 y.o., male Today's Date: 02/17/2024  END OF SESSION:  PT End of Session - 02/17/24 0946     Visit Number 7    Number of Visits 12    Date for PT Re-Evaluation 04/08/24    PT Start Time 0930    PT Stop Time 1018    PT Time Calculation (min) 48 min           Past Medical History:  Diagnosis Date   Diabetes mellitus without complication (HCC)    Hyperlipidemia    Hypertension    Past Surgical History:  Procedure Laterality Date   HEMORRHOID SURGERY N/A 06/08/2013   Procedure: HEMORRHOIDECTOMY;  Surgeon: Bernarda Ned, MD;  Location: [facility];  Service: General;  Laterality: N/A;   NO PAST SURGERIES     Patient Active Problem List   Diagnosis Date Noted   Hypokalemia 11/05/2022   CVA (cerebral vascular accident) (HCC) 11/04/2022   GAD (generalized anxiety disorder) 09/19/2020   Atherosclerosis of native arteries of extremity with intermittent claudication (HCC) 10/21/2017   Anaphylactic syndrome 10/21/2016   Current smoker 06/04/2016   Depression, recurrent (HCC) 06/04/2016   Type 2 diabetes mellitus with other specified complication (HCC) 05/09/2015   Essential hypertension, benign 05/09/2015   Mixed hyperlipidemia 05/09/2015   REFERRING PROVIDER: Dettinger, Fonda LABOR, MD   REFERRING DIAG: Weakness of both lower extremities, At moderate risk for fall   THERAPY DIAG:  Unsteadiness on feet  Muscle weakness (generalized)  Other symptoms and signs involving the nervous system  Rationale for Evaluation and Treatment: Rehabilitation  ONSET DATE: 1 year ago  SUBJECTIVE:   SUBJECTIVE STATEMENT:   Pt reports 3/10 low back pain today and did good after last visit   PERTINENT HISTORY: COPD, hypertension, type 2 diabetes, history of a CVA, depression, and anxiety PAIN:  Are you having pain? Yes: NPRS scale: 3/10 Pain  location: low back Pain description: sore  PRECAUTIONS: None  RED FLAGS: None   WEIGHT BEARING RESTRICTIONS: No  FALLS:  Has patient fallen in last 6 months? No  LIVING ENVIRONMENT: Lives with: lives alone Lives in: House/apartment Stairs: Yes: External: 1 steps; none Has following equipment at home: None  OCCUPATION: Conservation officer, nature; part time  PLOF: Independent  PATIENT GOALS: improved mobility and be able to mow his yard (riding lawnmower)   NEXT MD VISIT: 03/23/24  OBJECTIVE:  Note: Objective measures were completed at Evaluation unless otherwise noted.  COGNITION: Overall cognitive status: Within functional limits for tasks assessed     SENSATION: Patient reports no numbness or tingling  LOWER EXTREMITY ROM: WFL for activities assessed  LOWER EXTREMITY MMT:  MMT Right eval Left eval  Hip flexion 4/5 4-/5  Hip extension    Hip abduction    Hip adduction    Hip internal rotation    Hip external rotation    Knee flexion 4-/5 4/5  Knee extension 4/5 4-/5  Ankle dorsiflexion 3+/5 3+/5  Ankle plantarflexion    Ankle inversion    Ankle eversion     (Blank rows = not tested)  FUNCTIONAL TESTS:  5 times sit to stand: 22.74 seconds with infrequent UE support from armrests Timed up and go (TUG): 15.11 seconds Sit to stand transfer: infrequent posterior loss of balance and required close supervision  GAIT: Assistive device utilized: None Level of assistance: Complete Independence Comments: decreased stride length and poor foot clearance bilaterally  Gait speed: 0.78 m/s                                                                                                                                 TREATMENT DATE:    02/17/24:                          EXERCISE LOG   balance/ LE   Exercise Repetitions and Resistance Comments  Nustep Level 4 x 16 minutes   Goal Assessment     Rockerboard  5 minutes in parallel bars CGA/SBA  Side-Stepping Airex balance beam  x  4 mins CGA/SBA  Tandem Gait  Airex   balance beam x 4 mins SBA/CGA  Seated hip abduction    Seated hip adduction    Seated Ham Curls    4# LAQ's 2 x20  alternating    4# Marching  2  x 20  alternating     01/27/2024                                   EXERCISE LOG   Balance/ conditioning  Exercise Repetitions and Resistance Comments  Nustep L3 x 15 mins   Seated Marching 3# 2 sets of 15 reps   LAQ 3# 2 sets of 15 reps   HS curl  2 x 20 red    Resisted dorsiflexion     Hip add iso     Hip abd  2.5 min w/ red    Static balance with UE reaches  Easy   Modified tandem balance    Forward Step Ups  UE support required  Side-stepping Airex x 3 mins   Tandem Gait Airex x 3 mins   Toe taps 2x 20 to 6 box   Rocker board 4 min    Tandem stance     Blank cell = exercise not performed today   PATIENT EDUCATION:  Education details: Plan of care, prognosis, objective findings, and goals for physical therapy Person educated: Patient Education method: Explanation Education comprehension: verbalized understanding  HOME EXERCISE PROGRAM:   ASSESSMENT:  CLINICAL IMPRESSION: Pt arrives for today's treatment session reporting 2-3/10 low back pain, but doing okay. Rx focused on balance act's  as well as LE strengthening and did very well with mainly fatigue end of session.    OBJECTIVE IMPAIRMENTS: Abnormal gait, decreased activity tolerance, decreased balance, decreased mobility, difficulty walking, decreased strength, and pain.   ACTIVITY LIMITATIONS: standing, transfers, and locomotion level  PARTICIPATION LIMITATIONS: shopping, community activity, and yard work  PERSONAL FACTORS: Past/current experiences, Time since onset of injury/illness/exacerbation, and 3+ comorbidities: COPD, hypertension, type 2 diabetes, history of a CVA, depression, and anxiety are also affecting patient's functional outcome.   REHAB POTENTIAL: Good  CLINICAL DECISION MAKING: Evolving/moderate  complexity  EVALUATION COMPLEXITY: Moderate   GOALS: Goals  reviewed with patient? Yes  SHORT TERM GOALS: Target date: 02/03/24 Patient will be independent with his initial HEP. Baseline: Goal status: MET  2.  Patient will improve his timed up and go time to 12 seconds or less to reduce his fall risk. Baseline: 9/3: 11.5 seconds Goal status: MET  3.  Patient will improve his 5 times sit to stand time to 17 seconds or less for improved lower extremity power. Baseline: 9/3: 16.5 seconds Goal status: MET  LONG TERM GOALS: Target date: 02/24/24  Patient will be independent with his advanced HEP. Baseline:  Goal status: IN PROGRESS  2.  Patient will improve his gait speed to at least 1.0 m/s for improved community mobility. Baseline:  Goal status: IN PROGRESS  3.  Patient will be able to transfer from sitting to standing with minimal to no difficulty for improved improved independence. Baseline:  Goal status: MET  4.  Patient will improve his 5 times sit to stand time to 12 seconds or less to reduce his fall risk. Baseline: 9/3: 16.5 seconds Goal status: IN PROGRESS  PLAN:  PT FREQUENCY: 2x/week  PT DURATION: 6 weeks  PLANNED INTERVENTIONS: 97164- PT Re-evaluation, 97750- Physical Performance Testing, 97110-Therapeutic exercises, 97530- Therapeutic activity, 97112- Neuromuscular re-education, 97535- Self Care, 02859- Manual therapy, [phone]- Gait training, Patient/Family education, Balance training, and Stair training  PLAN FOR NEXT SESSION: NuStep, lower extremity strengthening, gait training, and balance interventions   Shakala Marlatt,CHRIS, PTA 02/17/2024, 1:29 PM

## 2024-02-24 ENCOUNTER — Ambulatory Visit

## 2024-02-24 DIAGNOSIS — R2681 Unsteadiness on feet: Secondary | ICD-10-CM | POA: Diagnosis not present

## 2024-02-24 DIAGNOSIS — M6281 Muscle weakness (generalized): Secondary | ICD-10-CM

## 2024-02-24 DIAGNOSIS — R29818 Other symptoms and signs involving the nervous system: Secondary | ICD-10-CM

## 2024-02-24 NOTE — Therapy (Signed)
 OUTPATIENT PHYSICAL THERAPY LOWER EXTREMITY TREATMENT   Patient Name: Randy [name] MRN: [id number] DOB:Jul 24, 1950, 73 y.o., male Today's Date: 02/24/2024  END OF SESSION:  PT End of Session - 02/24/24 0922     Visit Number 8    Number of Visits 12    Date for PT Re-Evaluation 04/08/24    PT Start Time 0919    PT Stop Time 1002    PT Time Calculation (min) 43 min           Past Medical History:  Diagnosis Date   Diabetes mellitus without complication (HCC)    Hyperlipidemia    Hypertension    Past Surgical History:  Procedure Laterality Date   HEMORRHOID SURGERY N/A 06/08/2013   Procedure: HEMORRHOIDECTOMY;  Surgeon: Bernarda Ned, MD;  Location: [facility];  Service: General;  Laterality: N/A;   NO PAST SURGERIES     Patient Active Problem List   Diagnosis Date Noted   Hypokalemia 11/05/2022   CVA (cerebral vascular accident) (HCC) 11/04/2022   GAD (generalized anxiety disorder) 09/19/2020   Atherosclerosis of native arteries of extremity with intermittent claudication (HCC) 10/21/2017   Anaphylactic syndrome 10/21/2016   Current smoker 06/04/2016   Depression, recurrent (HCC) 06/04/2016   Type 2 diabetes mellitus with other specified complication (HCC) 05/09/2015   Essential hypertension, benign 05/09/2015   Mixed hyperlipidemia 05/09/2015   REFERRING PROVIDER: Dettinger, Fonda LABOR, MD   REFERRING DIAG: Weakness of both lower extremities, At moderate risk for fall   THERAPY DIAG:  Unsteadiness on feet  Muscle weakness (generalized)  Other symptoms and signs involving the nervous system  Rationale for Evaluation and Treatment: Rehabilitation  ONSET DATE: 1 year ago  SUBJECTIVE:   SUBJECTIVE STATEMENT:   Pt reports 4/10 low back pain today.  PERTINENT HISTORY: COPD, hypertension, type 2 diabetes, history of a CVA, depression, and anxiety PAIN:  Are you having pain? Yes: NPRS scale: 4/10 Pain location: low back Pain  description: sore  PRECAUTIONS: None  RED FLAGS: None   WEIGHT BEARING RESTRICTIONS: No  FALLS:  Has patient fallen in last 6 months? No  LIVING ENVIRONMENT: Lives with: lives alone Lives in: House/apartment Stairs: Yes: External: 1 steps; none Has following equipment at home: None  OCCUPATION: Conservation officer, nature; part time  PLOF: Independent  PATIENT GOALS: improved mobility and be able to mow his yard (riding lawnmower)   NEXT MD VISIT: 03/23/24  OBJECTIVE:  Note: Objective measures were completed at Evaluation unless otherwise noted.  COGNITION: Overall cognitive status: Within functional limits for tasks assessed     SENSATION: Patient reports no numbness or tingling  LOWER EXTREMITY ROM: WFL for activities assessed  LOWER EXTREMITY MMT:  MMT Right eval Left eval  Hip flexion 4/5 4-/5  Hip extension    Hip abduction    Hip adduction    Hip internal rotation    Hip external rotation    Knee flexion 4-/5 4/5  Knee extension 4/5 4-/5  Ankle dorsiflexion 3+/5 3+/5  Ankle plantarflexion    Ankle inversion    Ankle eversion     (Blank rows = not tested)  FUNCTIONAL TESTS:  5 times sit to stand: 22.74 seconds with infrequent UE support from armrests Timed up and go (TUG): 15.11 seconds Sit to stand transfer: infrequent posterior loss of balance and required close supervision  GAIT: Assistive device utilized: None Level of assistance: Complete Independence Comments: decreased stride length and poor foot clearance bilaterally  Gait speed: 0.78 m/s  TREATMENT DATE:    02/24/24:                          EXERCISE LOG   balance/ LE   Exercise Repetitions and Resistance Comments  Nustep Level 4 x 16 minutes   Goal Assessment     Rockerboard  5 minutes in parallel bars CGA/SBA  Side-Stepping Airex balance beam  x 4 mins CGA/SBA  Tandem  Gait  Airex   balance beam x 4 mins SBA/CGA  Cybex Knee Flexion 30# x 3 mins   Cybex Knee Extension 10# x 3 mins   Seated hip abduction    Seated hip adduction    Seated Ham Curls    LAQ's    Seated Marching  4# x 2 mins   STSs 10 reps     01/27/2024                                   EXERCISE LOG   Balance/ conditioning  Exercise Repetitions and Resistance Comments  Nustep L3 x 15 mins   Seated Marching 3# 2 sets of 15 reps   LAQ 3# 2 sets of 15 reps   HS curl  2 x 20 red    Resisted dorsiflexion     Hip add iso     Hip abd  2.5 min w/ red    Static balance with UE reaches  Easy   Modified tandem balance    Forward Step Ups  UE support required  Side-stepping Airex x 3 mins   Tandem Gait Airex x 3 mins   Toe taps 2x 20 to 6 box   Rocker board 4 min    Tandem stance     Blank cell = exercise not performed today   PATIENT EDUCATION:  Education details: Plan of care, prognosis, objective findings, and goals for physical therapy Person educated: Patient Education method: Explanation Education comprehension: verbalized understanding  HOME EXERCISE PROGRAM:   ASSESSMENT:  CLINICAL IMPRESSION: Pt arrives for today's treatment session reporting 4/10 low back pain.  Pt introduced to cybex knee flexion and extension today with min cues required for eccentric control.  Pt challenged by strengthening exercises today, but no pain experienced.  Pt able to perform STS transfers without use of UE support.  Pt reported decreased pain at completion of today's treatment session, but does report increased fatigue.    OBJECTIVE IMPAIRMENTS: Abnormal gait, decreased activity tolerance, decreased balance, decreased mobility, difficulty walking, decreased strength, and pain.   ACTIVITY LIMITATIONS: standing, transfers, and locomotion level  PARTICIPATION LIMITATIONS: shopping, community activity, and yard work  PERSONAL FACTORS: Past/current experiences, Time since onset of  injury/illness/exacerbation, and 3+ comorbidities: COPD, hypertension, type 2 diabetes, history of a CVA, depression, and anxiety are also affecting patient's functional outcome.   REHAB POTENTIAL: Good  CLINICAL DECISION MAKING: Evolving/moderate complexity  EVALUATION COMPLEXITY: Moderate   GOALS: Goals reviewed with patient? Yes  SHORT TERM GOALS: Target date: 02/03/24 Patient will be independent with his initial HEP. Baseline: Goal status: MET  2.  Patient will improve his timed up and go time to 12 seconds or less to reduce his fall risk. Baseline: 9/3: 11.5 seconds Goal status: MET  3.  Patient will improve his 5 times sit to stand time to 17 seconds or less for improved lower extremity power. Baseline: 9/3: 16.5 seconds Goal status: MET  LONG  TERM GOALS: Target date: 02/24/24  Patient will be independent with his advanced HEP. Baseline:  Goal status: IN PROGRESS  2.  Patient will improve his gait speed to at least 1.0 m/s for improved community mobility. Baseline:  Goal status: IN PROGRESS  3.  Patient will be able to transfer from sitting to standing with minimal to no difficulty for improved improved independence. Baseline:  Goal status: MET  4.  Patient will improve his 5 times sit to stand time to 12 seconds or less to reduce his fall risk. Baseline: 9/3: 16.5 seconds Goal status: IN PROGRESS  PLAN:  PT FREQUENCY: 2x/week  PT DURATION: 6 weeks  PLANNED INTERVENTIONS: 97164- PT Re-evaluation, 97750- Physical Performance Testing, 97110-Therapeutic exercises, 97530- Therapeutic activity, 97112- Neuromuscular re-education, 97535- Self Care, 02859- Manual therapy, [phone]- Gait training, Patient/Family education, Balance training, and Stair training  PLAN FOR NEXT SESSION: NuStep, lower extremity strengthening, gait training, and balance interventions   Delon DELENA Gosling, PTA 02/24/2024, 10:14 AM

## 2024-02-25 ENCOUNTER — Other Ambulatory Visit

## 2024-03-02 ENCOUNTER — Ambulatory Visit

## 2024-03-02 DIAGNOSIS — M6281 Muscle weakness (generalized): Secondary | ICD-10-CM

## 2024-03-02 DIAGNOSIS — R2681 Unsteadiness on feet: Secondary | ICD-10-CM

## 2024-03-02 NOTE — Therapy (Signed)
 OUTPATIENT PHYSICAL THERAPY LOWER EXTREMITY TREATMENT   Patient Name: Randy [name] MRN: [id number] DOB:July 10, 1950, 73 y.o., male Today's Date: 03/02/2024  END OF SESSION:  PT End of Session - 03/02/24 0848     Visit Number 9    Number of Visits 12    Date for Recertification  04/08/24    PT Start Time 0845    PT Stop Time 0925    PT Time Calculation (min) 40 min           Past Medical History:  Diagnosis Date   Diabetes mellitus without complication (HCC)    Hyperlipidemia    Hypertension    Past Surgical History:  Procedure Laterality Date   HEMORRHOID SURGERY N/A 06/08/2013   Procedure: HEMORRHOIDECTOMY;  Surgeon: Bernarda Ned, MD;  Location: [facility];  Service: General;  Laterality: N/A;   NO PAST SURGERIES     Patient Active Problem List   Diagnosis Date Noted   Hypokalemia 11/05/2022   CVA (cerebral vascular accident) (HCC) 11/04/2022   GAD (generalized anxiety disorder) 09/19/2020   Atherosclerosis of native arteries of extremity with intermittent claudication 10/21/2017   Anaphylactic syndrome 10/21/2016   Current smoker 06/04/2016   Depression, recurrent 06/04/2016   Type 2 diabetes mellitus with other specified complication (HCC) 05/09/2015   Essential hypertension, benign 05/09/2015   Mixed hyperlipidemia 05/09/2015   REFERRING PROVIDER: Dettinger, Fonda LABOR, MD   REFERRING DIAG: Weakness of both lower extremities, At moderate risk for fall   THERAPY DIAG:  Unsteadiness on feet  Muscle weakness (generalized)  Rationale for Evaluation and Treatment: Rehabilitation  ONSET DATE: 1 year ago  SUBJECTIVE:   SUBJECTIVE STATEMENT:   Pt reports 4/10 low back pain today.  PERTINENT HISTORY: COPD, hypertension, type 2 diabetes, history of a CVA, depression, and anxiety PAIN:  Are you having pain? Yes: NPRS scale: 4/10 Pain location: low back Pain description: sore  PRECAUTIONS: None  RED FLAGS: None   WEIGHT BEARING  RESTRICTIONS: No  FALLS:  Has patient fallen in last 6 months? No  LIVING ENVIRONMENT: Lives with: lives alone Lives in: House/apartment Stairs: Yes: External: 1 steps; none Has following equipment at home: None  OCCUPATION: Conservation officer, nature; part time  PLOF: Independent  PATIENT GOALS: improved mobility and be able to mow his yard (riding lawnmower)   NEXT MD VISIT: 03/23/24  OBJECTIVE:  Note: Objective measures were completed at Evaluation unless otherwise noted.  COGNITION: Overall cognitive status: Within functional limits for tasks assessed     SENSATION: Patient reports no numbness or tingling  LOWER EXTREMITY ROM: WFL for activities assessed  LOWER EXTREMITY MMT:  MMT Right eval Left eval  Hip flexion 4/5 4-/5  Hip extension    Hip abduction    Hip adduction    Hip internal rotation    Hip external rotation    Knee flexion 4-/5 4/5  Knee extension 4/5 4-/5  Ankle dorsiflexion 3+/5 3+/5  Ankle plantarflexion    Ankle inversion    Ankle eversion     (Blank rows = not tested)  FUNCTIONAL TESTS:  5 times sit to stand: 22.74 seconds with infrequent UE support from armrests Timed up and go (TUG): 15.11 seconds Sit to stand transfer: infrequent posterior loss of balance and required close supervision  GAIT: Assistive device utilized: None Level of assistance: Complete Independence Comments: decreased stride length and poor foot clearance bilaterally  Gait speed: 0.78 m/s  TREATMENT DATE:    03/02/24:                          EXERCISE LOG   balance/ LE   Exercise Repetitions and Resistance Comments  Nustep Level 4 x 15 minutes   Goal Assessment     Rockerboard  5 minutes in parallel bars CGA/SBA  Side-Stepping Airex balance beam  x 4.5 mins CGA/SBA  Tandem Gait  Airex   balance beam x 4.5 mins SBA/CGA  Cybex Knee Flexion 30# x 3.5  mins   Cybex Knee Extension 10# x 3.5 mins   Seated hip abduction    Seated hip adduction    Seated Ham Curls    LAQ's    Seated Marching  4# x 2 mins   STSs 15 reps     01/27/2024                                   EXERCISE LOG   Balance/ conditioning  Exercise Repetitions and Resistance Comments  Nustep L3 x 15 mins   Seated Marching 3# 2 sets of 15 reps   LAQ 3# 2 sets of 15 reps   HS curl  2 x 20 red    Resisted dorsiflexion     Hip add iso     Hip abd  2.5 min w/ red    Static balance with UE reaches  Easy   Modified tandem balance    Forward Step Ups  UE support required  Side-stepping Airex x 3 mins   Tandem Gait Airex x 3 mins   Toe taps 2x 20 to 6 box   Rocker board 4 min    Tandem stance     Blank cell = exercise not performed today   PATIENT EDUCATION:  Education details: Plan of care, prognosis, objective findings, and goals for physical therapy Person educated: Patient Education method: Explanation Education comprehension: verbalized understanding  HOME EXERCISE PROGRAM:   ASSESSMENT:  CLINICAL IMPRESSION: Pt arrives for today's treatment session reporting 4/10 low back pain.  Pt reports feeling good after last treatment session. Pt able to tolerate increased time with cybex exercises today with fatigue noted post knee extension.  Pt also able to tolerate increased time with balance beam exercises today.  Pt able to perform all balance exercises with less frequent use of UE support.  Pt reported minimal decrease in pain at completion of today's treatment session.  Pt will need progress note at next session.   OBJECTIVE IMPAIRMENTS: Abnormal gait, decreased activity tolerance, decreased balance, decreased mobility, difficulty walking, decreased strength, and pain.   ACTIVITY LIMITATIONS: standing, transfers, and locomotion level  PARTICIPATION LIMITATIONS: shopping, community activity, and yard work  PERSONAL FACTORS: Past/current experiences, Time since  onset of injury/illness/exacerbation, and 3+ comorbidities: COPD, hypertension, type 2 diabetes, history of a CVA, depression, and anxiety are also affecting patient's functional outcome.   REHAB POTENTIAL: Good  CLINICAL DECISION MAKING: Evolving/moderate complexity  EVALUATION COMPLEXITY: Moderate   GOALS: Goals reviewed with patient? Yes  SHORT TERM GOALS: Target date: 02/03/24 Patient will be independent with his initial HEP. Baseline: Goal status: MET  2.  Patient will improve his timed up and go time to 12 seconds or less to reduce his fall risk. Baseline: 9/3: 11.5 seconds Goal status: MET  3.  Patient will improve his 5 times sit to stand time to  17 seconds or less for improved lower extremity power. Baseline: 9/3: 16.5 seconds Goal status: MET  LONG TERM GOALS: Target date: 02/24/24  Patient will be independent with his advanced HEP. Baseline:  Goal status: IN PROGRESS  2.  Patient will improve his gait speed to at least 1.0 m/s for improved community mobility. Baseline:  Goal status: IN PROGRESS  3.  Patient will be able to transfer from sitting to standing with minimal to no difficulty for improved improved independence. Baseline:  Goal status: MET  4.  Patient will improve his 5 times sit to stand time to 12 seconds or less to reduce his fall risk. Baseline: 9/3: 16.5 seconds Goal status: IN PROGRESS  PLAN:  PT FREQUENCY: 2x/week  PT DURATION: 6 weeks  PLANNED INTERVENTIONS: 97164- PT Re-evaluation, 97750- Physical Performance Testing, 97110-Therapeutic exercises, 97530- Therapeutic activity, 97112- Neuromuscular re-education, 97535- Self Care, [id number]- Manual therapy, [phone]- Gait training, Patient/Family education, Balance training, and Stair training  PLAN FOR NEXT SESSION: NuStep, lower extremity strengthening, gait training, and balance interventions   Delon DELENA Gosling, PTA 03/02/2024, 9:29 AM

## 2024-03-09 ENCOUNTER — Ambulatory Visit: Attending: Family Medicine

## 2024-03-09 DIAGNOSIS — M6281 Muscle weakness (generalized): Secondary | ICD-10-CM | POA: Insufficient documentation

## 2024-03-09 DIAGNOSIS — R2681 Unsteadiness on feet: Secondary | ICD-10-CM | POA: Diagnosis present

## 2024-03-09 NOTE — Therapy (Addendum)
 OUTPATIENT PHYSICAL THERAPY LOWER EXTREMITY TREATMENT   Patient Name: Randy [name] MRN: [id number] DOB:Oct 08, 1950, 73 y.o., male Today's Date: 03/09/2024  END OF SESSION:  PT End of Session - 03/09/24 0846     Visit Number 10    Number of Visits 12    Date for Recertification  04/08/24    PT Start Time 0845    PT Stop Time 0930    PT Time Calculation (min) 45 min           Past Medical History:  Diagnosis Date   Diabetes mellitus without complication (HCC)    Hyperlipidemia    Hypertension    Past Surgical History:  Procedure Laterality Date   HEMORRHOID SURGERY N/A 06/08/2013   Procedure: HEMORRHOIDECTOMY;  Surgeon: Bernarda Ned, MD;  Location: [facility];  Service: General;  Laterality: N/A;   NO PAST SURGERIES     Patient Active Problem List   Diagnosis Date Noted   Hypokalemia 11/05/2022   CVA (cerebral vascular accident) (HCC) 11/04/2022   GAD (generalized anxiety disorder) 09/19/2020   Atherosclerosis of native arteries of extremity with intermittent claudication 10/21/2017   Anaphylactic syndrome 10/21/2016   Current smoker 06/04/2016   Depression, recurrent 06/04/2016   Type 2 diabetes mellitus with other specified complication (HCC) 05/09/2015   Essential hypertension, benign 05/09/2015   Mixed hyperlipidemia 05/09/2015   REFERRING PROVIDER: Dettinger, Fonda LABOR, MD   REFERRING DIAG: Weakness of both lower extremities, At moderate risk for fall   THERAPY DIAG:  Unsteadiness on feet  Muscle weakness (generalized)  Rationale for Evaluation and Treatment: Rehabilitation  ONSET DATE: 1 year ago  SUBJECTIVE:   SUBJECTIVE STATEMENT:   Pt denies any pain today.  PERTINENT HISTORY: COPD, hypertension, type 2 diabetes, history of a CVA, depression, and anxiety PAIN:  Are you having pain? No  PRECAUTIONS: None  RED FLAGS: None   WEIGHT BEARING RESTRICTIONS: No  FALLS:  Has patient fallen in last 6 months? No  LIVING  ENVIRONMENT: Lives with: lives alone Lives in: House/apartment Stairs: Yes: External: 1 steps; none Has following equipment at home: None  OCCUPATION: Conservation officer, [employer]; part time  PLOF: Independent  PATIENT GOALS: improved mobility and be able to mow his yard (riding lawnmower)   NEXT MD VISIT: 03/23/24  OBJECTIVE:  Note: Objective measures were completed at Evaluation unless otherwise noted.  COGNITION: Overall cognitive status: Within functional limits for tasks assessed     SENSATION: Patient reports no numbness or tingling  LOWER EXTREMITY ROM: WFL for activities assessed  LOWER EXTREMITY MMT:  MMT Right eval Left eval  Hip flexion 4/5 4-/5  Hip extension    Hip abduction    Hip adduction    Hip internal rotation    Hip external rotation    Knee flexion 4-/5 4/5  Knee extension 4/5 4-/5  Ankle dorsiflexion 3+/5 3+/5  Ankle plantarflexion    Ankle inversion    Ankle eversion     (Blank rows = not tested)  FUNCTIONAL TESTS:  5 times sit to stand: 22.74 seconds with infrequent UE support from armrests Timed up and go (TUG): 15.11 seconds Sit to stand transfer: infrequent posterior loss of balance and required close supervision  GAIT: Assistive device utilized: None Level of assistance: Complete Independence Comments: decreased stride length and poor foot clearance bilaterally  Gait speed: 0.78 m/s  TREATMENT DATE:    03/09/24:                          EXERCISE LOG   balance/ LE   Exercise Repetitions and Resistance Comments  Nustep Level 4 x 15 minutes   Goal Assessment  See Below   Rockerboard   CGA/SBA  Side-Stepping  CGA/SBA  Tandem Gait   SBA/CGA  Cybex Knee Flexion 30# x 4 mins   Cybex Knee Extension 10# x 4 mins   Seated hip abduction    Seated hip adduction    Seated Ham Curls    LAQ's    Seated Marching     STSs 15 reps      01/27/2024                                   EXERCISE LOG   Balance/ conditioning  Exercise Repetitions and Resistance Comments  Nustep L3 x 15 mins   Seated Marching 3# 2 sets of 15 reps   LAQ 3# 2 sets of 15 reps   HS curl  2 x 20 red    Resisted dorsiflexion     Hip add iso     Hip abd  2.5 min w/ red    Static balance with UE reaches  Easy   Modified tandem balance    Forward Step Ups  UE support required  Side-stepping Airex x 3 mins   Tandem Gait Airex x 3 mins   Toe taps 2x 20 to 6 box   Rocker board 4 min    Tandem stance     Blank cell = exercise not performed today   PATIENT EDUCATION:  Education details: Plan of care, prognosis, objective findings, and goals for physical therapy Person educated: Patient Education method: Explanation Education comprehension: verbalized understanding  HOME EXERCISE PROGRAM:   ASSESSMENT:  CLINICAL IMPRESSION: Pt arrives for today's treatment session denying any pain. Pt able to increase gait speed to 1.0 m/s and able to perform 5 STS test in 12 seconds, meeting both of his remaining LTGs.  Pt able to tolerate increased time on cybex machinery today.  Pt states that he cannot tell much difference in his leg strength or balance since beginning therapy.  Discussed with pt the importance of staying active and attending local gym if possible.  Pt verbalized understanding, but unsure of carryover.  Pt denied any pain at completion of today's treatment session.  Pt ready for discharge at this time.   OBJECTIVE IMPAIRMENTS: Abnormal gait, decreased activity tolerance, decreased balance, decreased mobility, difficulty walking, decreased strength, and pain.   ACTIVITY LIMITATIONS: standing, transfers, and locomotion level  PARTICIPATION LIMITATIONS: shopping, community activity, and yard work  PERSONAL FACTORS: Past/current experiences, Time since onset of injury/illness/exacerbation, and 3+ comorbidities: COPD, hypertension, type 2  diabetes, history of a CVA, depression, and anxiety are also affecting patient's functional outcome.   REHAB POTENTIAL: Good  CLINICAL DECISION MAKING: Evolving/moderate complexity  EVALUATION COMPLEXITY: Moderate   GOALS: Goals reviewed with patient? Yes  SHORT TERM GOALS: Target date: 02/03/24 Patient will be independent with his initial HEP. Baseline: Goal status: MET  2.  Patient will improve his timed up and go time to 12 seconds or less to reduce his fall risk. Baseline: 9/3: 11.5 seconds Goal status: MET  3.  Patient will improve his 5 times sit to stand time to  17 seconds or less for improved lower extremity power. Baseline: 9/3: 16.5 seconds Goal status: MET  LONG TERM GOALS: Target date: 02/24/24  Patient will be independent with his advanced HEP. Baseline:  Goal status: MET  2.  Patient will improve his gait speed to at least 1.0 m/s for improved community mobility. Baseline: 10/1: 1.0 m/s Goal status: MET  3.  Patient will be able to transfer from sitting to standing with minimal to no difficulty for improved improved independence. Baseline:  Goal status: MET  4.  Patient will improve his 5 times sit to stand time to 12 seconds or less to reduce his fall risk. Baseline: 9/3: 16.5 seconds; 10/1: 12.75 seconds Goal status: PARTIALLY MET  PLAN:  PT FREQUENCY: 2x/week  PT DURATION: 6 weeks  PLANNED INTERVENTIONS: 97164- PT Re-evaluation, 97750- Physical Performance Testing, 97110-Therapeutic exercises, 97530- Therapeutic activity, 97112- Neuromuscular re-education, 97535- Self Care, 02859- Manual therapy, [phone]- Gait training, Patient/Family education, Balance training, and Stair training  PLAN FOR NEXT SESSION: NuStep, lower extremity strengthening, gait training, and balance interventions   Delon DELENA Gosling, PTA 03/09/2024, 10:14 AM   PHYSICAL THERAPY DISCHARGE SUMMARY  Visits from Start of Care: 10.  Current functional level related to goals /  functional outcomes: See above.     Remaining deficits: LTG's #1 to #3 met.   Education / Equipment: HEP.   Patient agrees to discharge. Patient goals were partially met. Patient is being discharged due to being pleased with the current functional level.    Chad Applegate MPT

## 2024-03-23 ENCOUNTER — Encounter: Payer: Self-pay | Admitting: Family Medicine

## 2024-03-23 ENCOUNTER — Ambulatory Visit: Admitting: Family Medicine

## 2024-03-23 VITALS — BP 128/72 | HR 90 | Ht 69.0 in | Wt 193.0 lb

## 2024-03-23 DIAGNOSIS — E1159 Type 2 diabetes mellitus with other circulatory complications: Secondary | ICD-10-CM | POA: Diagnosis not present

## 2024-03-23 DIAGNOSIS — I10 Essential (primary) hypertension: Secondary | ICD-10-CM | POA: Diagnosis not present

## 2024-03-23 DIAGNOSIS — R351 Nocturia: Secondary | ICD-10-CM

## 2024-03-23 DIAGNOSIS — I152 Hypertension secondary to endocrine disorders: Secondary | ICD-10-CM

## 2024-03-23 DIAGNOSIS — N401 Enlarged prostate with lower urinary tract symptoms: Secondary | ICD-10-CM | POA: Diagnosis not present

## 2024-03-23 DIAGNOSIS — E785 Hyperlipidemia, unspecified: Secondary | ICD-10-CM | POA: Diagnosis not present

## 2024-03-23 DIAGNOSIS — E782 Mixed hyperlipidemia: Secondary | ICD-10-CM

## 2024-03-23 DIAGNOSIS — Z794 Long term (current) use of insulin: Secondary | ICD-10-CM

## 2024-03-23 DIAGNOSIS — Z23 Encounter for immunization: Secondary | ICD-10-CM | POA: Diagnosis not present

## 2024-03-23 DIAGNOSIS — E1169 Type 2 diabetes mellitus with other specified complication: Secondary | ICD-10-CM

## 2024-03-23 LAB — CBC WITH DIFFERENTIAL/PLATELET
Basophils Absolute: 0.1 x10E3/uL (ref 0.0–0.2)
Basos: 1 %
EOS (ABSOLUTE): 0.3 x10E3/uL (ref 0.0–0.4)
Eos: 3 %
Hematocrit: 43.3 % (ref 37.5–51.0)
Hemoglobin: 14.3 g/dL (ref 13.0–17.7)
Immature Grans (Abs): 0.1 x10E3/uL (ref 0.0–0.1)
Immature Granulocytes: 1 %
Lymphocytes Absolute: 1.8 x10E3/uL (ref 0.7–3.1)
Lymphs: 19 %
MCH: 31.2 pg (ref 26.6–33.0)
MCHC: 33 g/dL (ref 31.5–35.7)
MCV: 94 fL (ref 79–97)
Monocytes Absolute: 0.7 x10E3/uL (ref 0.1–0.9)
Monocytes: 8 %
Neutrophils Absolute: 6.3 x10E3/uL (ref 1.4–7.0)
Neutrophils: 68 %
Platelets: 224 x10E3/uL (ref 150–450)
RBC: 4.59 x10E6/uL (ref 4.14–5.80)
RDW: 12.8 % (ref 11.6–15.4)
WBC: 9.2 x10E3/uL (ref 3.4–10.8)

## 2024-03-23 LAB — CMP14+EGFR
ALT: 18 IU/L (ref 0–44)
AST: 17 IU/L (ref 0–40)
Albumin: 4.4 g/dL (ref 3.8–4.8)
Alkaline Phosphatase: 104 IU/L (ref 47–123)
BUN/Creatinine Ratio: 17 (ref 10–24)
BUN: 22 mg/dL (ref 8–27)
Bilirubin Total: 0.4 mg/dL (ref 0.0–1.2)
CO2: 26 mmol/L (ref 20–29)
Calcium: 9.8 mg/dL (ref 8.6–10.2)
Chloride: 97 mmol/L (ref 96–106)
Creatinine, Ser: 1.31 mg/dL — ABNORMAL HIGH (ref 0.76–1.27)
Globulin, Total: 2.3 g/dL (ref 1.5–4.5)
Glucose: 129 mg/dL — ABNORMAL HIGH (ref 70–99)
Potassium: 4.4 mmol/L (ref 3.5–5.2)
Sodium: 138 mmol/L (ref 134–144)
Total Protein: 6.7 g/dL (ref 6.0–8.5)
eGFR: 58 mL/min/1.73 — ABNORMAL LOW (ref >59)

## 2024-03-23 LAB — BAYER DCA HB A1C WAIVED: HB A1C (BAYER DCA - WAIVED): 8.4 % — ABNORMAL HIGH (ref 4.8–5.6)

## 2024-03-23 LAB — LIPID PANEL
Chol/HDL Ratio: 2.8 ratio (ref 0.0–5.0)
Cholesterol, Total: 128 mg/dL (ref 100–199)
HDL: 46 mg/dL (ref >39)
LDL Chol Calc (NIH): 62 mg/dL (ref 0–99)
Triglycerides: 111 mg/dL (ref 0–149)
VLDL Cholesterol Cal: 20 mg/dL (ref 5–40)

## 2024-03-23 MED ORDER — TAMSULOSIN HCL 0.4 MG PO CAPS
0.4000 mg | ORAL_CAPSULE | Freq: Every day | ORAL | 3 refills | Status: AC
Start: 2024-03-23 — End: ?

## 2024-03-23 NOTE — Progress Notes (Signed)
 BP 128/72   Pulse 90   Ht 5' 9 (1.753 m)   Wt 193 lb (87.5 kg)   SpO2 96%   BMI 28.50 kg/m    Subjective:   Patient ID: Randy [name], male    DOB: January 01, 1951, 73 y.o.   MRN: [id number]  HPI: Randy [name] is a 73 y.o. male presenting on 03/23/2024 for Medical Management of Chronic Issues, Diabetes, Hyperlipidemia, Hypertension, and Depression   Discussed the use of AI scribe software for clinical note transcription with the patient, who gave verbal consent to proceed.  History of Present Illness   Randy [name] is a 73 year old male with diabetes who presents for a recheck of his blood sugar levels.  Hyperglycemia - Blood glucose levels improved in September but have increased in October, with readings sometimes reaching 190s to 200s mg/dL - Ran out of Humalog  for approximately two weeks due to a supply issue; has since resumed therapy - Currently taking Basaglar  70 units daily, Humalog  10 units with each meal, and Trulicity  once weekly  Lower extremity weakness - Weakness in both legs, described as feeling like 'Jell-O' and similar to post-marathon fatigue when walking distances - No associated swelling or pain in the legs - Stands for eight hours daily, but notes that standing is not equivalent to walking in terms of exertion  Urinary frequency - Increased frequency of urination - No associated lower extremity swelling          Relevant past medical, surgical, family and social history reviewed and updated as indicated. Interim medical history since our last visit reviewed. Allergies and medications reviewed and updated.  Review of Systems  Constitutional:  Negative for chills and fever.  Respiratory:  Negative for shortness of breath and wheezing.   Cardiovascular:  Negative for chest pain and leg swelling.  Genitourinary:  Positive for frequency. Negative for dysuria, hematuria and urgency.  Musculoskeletal:  Negative for back pain and gait  problem.  Skin:  Negative for rash.  All other systems reviewed and are negative.   Per HPI unless specifically indicated above   Allergies as of 03/23/2024   No Known Allergies      Medication List        Accurate as of March 23, 2024 10:15 AM. If you have any questions, ask your nurse or doctor.          Accu-Chek Guide test strip Generic drug: glucose blood Use as instructed to test blood sugar daily. DX: E11.65 (patient using guide meter now)   amLODipine  10 MG tablet Commonly known as: NORVASC  Take 1 tablet (10 mg total) by mouth daily.   aspirin  EC 81 MG tablet Take 81 mg by mouth daily.   Basaglar  KwikPen 100 UNIT/ML Inject 70 Units into the skin daily.   cilostazol  100 MG tablet Commonly known as: PLETAL  TAKE ONE (1) TABLET BY MOUTH TWO (2) TIMES DAILY   citalopram  40 MG tablet Commonly known as: CELEXA  Take 1 tablet (40 mg total) by mouth daily.   glucose monitoring kit monitoring kit 1 each by Does not apply route 2 (two) times daily before a meal.   GNP UltiCare Pen Needles 32G X 6 MM Misc Generic drug: Insulin  Pen Needle Use 3 times daily DX: E11.65   hydrochlorothiazide  25 MG tablet Commonly known as: HYDRODIURIL  Take 1 tablet (25 mg total) by mouth daily.   hydrOXYzine  50 MG capsule Commonly known as: VISTARIL  TAKE ONE TABLET BY MOUTH THREE  TIMES DAILY AS NEEDED   insulin  lispro 100 UNIT/ML KwikPen Commonly known as: HumaLOG  KwikPen Inject 15 Units into the skin 3 (three) times daily before meals. Inject up to 50 units subcutaneously daily as instructed.   lidocaine  5 % ointment Commonly known as: XYLOCAINE  Apply 1 application topically 3 (three) times daily as needed.   losartan  100 MG tablet Commonly known as: COZAAR  Take 1 tablet (100 mg total) by mouth daily.   Potassium Chloride  ER 20 MEQ Tbcr Take 20 mEq by mouth daily.   rosuvastatin  40 MG tablet Commonly known as: CRESTOR  Take 1 tablet (40 mg total) by mouth daily.    tamsulosin 0.4 MG Caps capsule Commonly known as: FLOMAX Take 1 capsule (0.4 mg total) by mouth daily. Started by: Fonda LABOR Aamir Mclinden   Trulicity  4.5 MG/0.5ML Soaj Generic drug: Dulaglutide  Inject 4.5 mg as directed once a week. DX: E11.65         Objective:   BP 128/72   Pulse 90   Ht 5' 9 (1.753 m)   Wt 193 lb (87.5 kg)   SpO2 96%   BMI 28.50 kg/m   Wt Readings from Last 3 Encounters:  03/23/24 193 lb (87.5 kg)  08/26/23 192 lb (87.1 kg)  05/20/23 189 lb (85.7 kg)    Physical Exam Vitals and nursing note reviewed.  Constitutional:      Appearance: Normal appearance.  Neurological:     Mental Status: He is alert.    Physical Exam   VITALS: BP- 128/72 NECK: Thyroid  normal. No cervical lymphadenopathy. CHEST: Lungs clear to auscultation bilaterally. CARDIOVASCULAR: Regular heart rhythm, no murmurs. EXTREMITIES: No swelling in extremities.         Assessment & Plan:   Problem List Items Addressed This Visit       Cardiovascular and Mediastinum   Hypertension associated with diabetes (HCC)     Endocrine   Type 2 diabetes mellitus with other specified complication (HCC) - Primary   Relevant Orders   Bayer DCA Hb A1c Waived   CBC with Differential/Platelet   CMP14+EGFR   Lipid panel   Hyperlipidemia associated with type 2 diabetes mellitus (HCC)   Other Visit Diagnoses       BPH associated with nocturia       Relevant Medications   tamsulosin (FLOMAX) 0.4 MG CAPS capsule          Type 2 diabetes mellitus associated with hypertension and hyperlipidemia Blood glucose elevated to 190s-200s due to missed Humalog . Increased urination likely from hyperglycemia. - Ensure consistent Humalog  use with meals. - Monitor blood glucose regularly. - Check A1c for long-term control.  A1c is up at 8.4, this is actually better than last time but still probably contributing to his urinary frequency.  Leg weakness Weakness likely from decreased endurance, not  thrombosis. - Encourage regular walking and physical activity.  Essential hypertension Blood pressure controlled at 128/72 mmHg with amlodipine  and hydrochlorothiazide .  Mixed hyperlipidemia On Crestor . Awaiting lab results for cholesterol assessment.          Follow up plan: Return in about 3 months (around 06/23/2024), or if symptoms worsen or fail to improve, for Diabetes recheck.  Counseling provided for all of the vaccine components Orders Placed This Encounter  Procedures   Bayer DCA Hb A1c Waived   CBC with Differential/Platelet   CMP14+EGFR   Lipid panel    Fonda Levins, MD [facility] 03/23/2024, 10:15 AM

## 2024-03-24 DIAGNOSIS — L84 Corns and callosities: Secondary | ICD-10-CM | POA: Diagnosis not present

## 2024-03-24 DIAGNOSIS — M79674 Pain in right toe(s): Secondary | ICD-10-CM | POA: Diagnosis not present

## 2024-03-24 DIAGNOSIS — B351 Tinea unguium: Secondary | ICD-10-CM | POA: Diagnosis not present

## 2024-03-24 DIAGNOSIS — M79675 Pain in left toe(s): Secondary | ICD-10-CM | POA: Diagnosis not present

## 2024-03-24 DIAGNOSIS — E1142 Type 2 diabetes mellitus with diabetic polyneuropathy: Secondary | ICD-10-CM | POA: Diagnosis not present

## 2024-03-28 ENCOUNTER — Ambulatory Visit: Payer: Self-pay | Admitting: Family Medicine

## 2024-04-03 ENCOUNTER — Other Ambulatory Visit: Payer: Self-pay | Admitting: Family Medicine

## 2024-04-03 DIAGNOSIS — E1169 Type 2 diabetes mellitus with other specified complication: Secondary | ICD-10-CM

## 2024-04-13 ENCOUNTER — Other Ambulatory Visit: Payer: Self-pay | Admitting: Family Medicine

## 2024-04-13 DIAGNOSIS — I10 Essential (primary) hypertension: Secondary | ICD-10-CM

## 2024-04-13 NOTE — Telephone Encounter (Unsigned)
 Copied from CRM [id number]. Topic: Clinical - Medication Refill >> Apr 13, 2024  9:46 AM Sophia H wrote: Medication: losartan  (COZAAR ) 100 MG tablet   Has the patient contacted their pharmacy? Yes, needs updated RX   This is the patient's preferred pharmacy:  [facility], [place] - [address] [place] [address] Phone: [phone] Fax: [phone]    Is this the correct pharmacy for this prescription? Yes If no, delete pharmacy and type the correct one.   Has the prescription been filled recently? Yes  Is the patient out of the medication? Yes  Has the patient been seen for an appointment in the last year OR does the patient have an upcoming appointment? Yes, seen in October 2025.  Can we respond through MyChart? Yes  Agent: Please be advised that Rx refills may take up to 3 business days. We ask that you follow-up with your pharmacy.

## 2024-04-14 MED ORDER — LOSARTAN POTASSIUM 100 MG PO TABS: 100.0000 mg | ORAL_TABLET | Freq: Every day | ORAL | 0 refills | Status: AC

## 2024-04-20 ENCOUNTER — Other Ambulatory Visit: Payer: Self-pay | Admitting: Family Medicine

## 2024-04-20 DIAGNOSIS — F339 Major depressive disorder, recurrent, unspecified: Secondary | ICD-10-CM

## 2024-04-20 DIAGNOSIS — F411 Generalized anxiety disorder: Secondary | ICD-10-CM

## 2024-05-10 ENCOUNTER — Telehealth: Payer: Self-pay

## 2024-05-10 NOTE — Telephone Encounter (Signed)
 Patient dropped off [employer] re-enrollment application for Basaglar , Humalog , and Trulicity  patient assistance.  In process of getting provider pages completed.

## 2024-05-11 NOTE — Telephone Encounter (Signed)
 PAP:  Re-enrollment application for Basaglar , Humalog , and Trulicity  has been submitted to [employer], via fax.  Please send new RX's to [facility] pharmacy for 90 day supplies (with refills). Thanks!

## 2024-05-12 ENCOUNTER — Telehealth: Payer: Self-pay | Admitting: Pharmacist

## 2024-05-12 DIAGNOSIS — E1169 Type 2 diabetes mellitus with other specified complication: Secondary | ICD-10-CM

## 2024-05-12 MED ORDER — BASAGLAR KWIKPEN 100 UNIT/ML ~~LOC~~ SOPN: 70.0000 [IU] | PEN_INJECTOR | Freq: Every day | SUBCUTANEOUS | 5 refills | Status: AC

## 2024-05-12 MED ORDER — TRULICITY 4.5 MG/0.5ML ~~LOC~~ SOAJ: 4.5000 mg | SUBCUTANEOUS | 4 refills | Status: AC

## 2024-05-12 MED ORDER — INSULIN LISPRO (1 UNIT DIAL) 100 UNIT/ML (KWIKPEN): 15.0000 [IU] | PEN_INJECTOR | Freq: Three times a day (TID) | SUBCUTANEOUS | 5 refills | Status: AC

## 2024-05-18 NOTE — Telephone Encounter (Signed)
 PAP: Patient assistance RE-ENROLLMENT application for Basaglar , Humalog , and Trulicity  4.5MG  has been approved by P[employer] Companies: [employer] from 06/09/24 to 06/08/25.   Medication should be delivered to Home.   For further shipping updates, please contact [employer] at [phone].

## 2024-05-19 ENCOUNTER — Other Ambulatory Visit: Payer: Self-pay | Admitting: Family Medicine

## 2024-05-30 NOTE — Telephone Encounter (Signed)
" ° °  Patient enrolled in the [employer] PAP.  Updated prescriptions for Basaglar , Trulicity  & Humalog  escribed to [facility] mail order (pharmacy for [facility] patient assistance).  Patient is stable on current regimen. A1c increased, but he was without medications.  This access will allow him to re-achieve his A1c goal<7%.  Randy [name], PharmD, BCACP, CPP Clinical Pharmacist, [facility]   "

## 2024-06-29 ENCOUNTER — Other Ambulatory Visit: Payer: Self-pay

## 2024-06-29 ENCOUNTER — Encounter: Payer: Self-pay | Admitting: Family Medicine

## 2024-06-29 ENCOUNTER — Ambulatory Visit: Payer: Self-pay | Admitting: Family Medicine

## 2024-06-29 VITALS — BP 165/67 | HR 69 | Ht 69.0 in | Wt 197.0 lb

## 2024-06-29 DIAGNOSIS — I152 Hypertension secondary to endocrine disorders: Secondary | ICD-10-CM

## 2024-06-29 DIAGNOSIS — E1169 Type 2 diabetes mellitus with other specified complication: Secondary | ICD-10-CM

## 2024-06-29 DIAGNOSIS — E785 Hyperlipidemia, unspecified: Secondary | ICD-10-CM | POA: Diagnosis not present

## 2024-06-29 DIAGNOSIS — E1159 Type 2 diabetes mellitus with other circulatory complications: Secondary | ICD-10-CM

## 2024-06-29 DIAGNOSIS — Z794 Long term (current) use of insulin: Secondary | ICD-10-CM | POA: Diagnosis not present

## 2024-06-29 DIAGNOSIS — Z1211 Encounter for screening for malignant neoplasm of colon: Secondary | ICD-10-CM

## 2024-06-29 DIAGNOSIS — Z23 Encounter for immunization: Secondary | ICD-10-CM

## 2024-06-29 LAB — HGB A1C W/O EAG: Hgb A1c MFr Bld: 10 % — ABNORMAL HIGH (ref 4.8–5.6)

## 2024-06-29 MED ORDER — METOPROLOL SUCCINATE ER 25 MG PO TB24: 25.0000 mg | ORAL_TABLET | Freq: Every day | ORAL | 3 refills | Status: AC

## 2024-06-29 MED ORDER — CILOSTAZOL 100 MG PO TABS: 100.0000 mg | ORAL_TABLET | Freq: Two times a day (BID) | ORAL | 1 refills | Status: AC

## 2024-06-29 NOTE — Addendum Note (Signed)
 Addended by: LEIGH ROSINA SAILOR on: 06/29/2024 09:50 AM   Modules accepted: Orders

## 2024-06-29 NOTE — Progress Notes (Signed)
 "  BP (!) 165/67   Pulse 69   Ht 5' 9 (1.753 m)   Wt 197 lb (89.4 kg)   SpO2 97%   BMI 29.09 kg/m    Subjective:   Patient ID: Randy [name], male    DOB: Sep 03, 1950, 74 y.o.   MRN: [id number]  HPI: Randy [name] is a 74 y.o. male presenting on 06/29/2024 for Medical Management of Chronic Issues, Diabetes, Hyperlipidemia, Hypertension, and Increased urination   Discussed the use of AI scribe software for clinical note transcription with the patient, who gave verbal consent to proceed.  History of Present Illness   Randy [name] is a 74 year old male with hypertension and diabetes who presents for a recheck of his blood pressure and blood sugar levels.  Hypertension - Blood pressure elevated, with readings around 156/70 mmHg - Home blood pressure measurements consistently in the 150s systolic - Currently taking amlodipine , hydrochlorothiazide , and losartan , all at maximum doses  Hyperglycemia - Morning blood glucose readings around 170 mg/dL - Only checks blood sugar in the morning - Currently on Trulicity  4.5 mg, Basaglar  70 units, and Humalog  10 units with meals - Takes Humalog  even with light meals  Lower urinary tract symptoms - Frequent urination - Previously trialed Flomax  without symptom relief  Hyperlipidemia and peripheral vascular disease - Currently taking Crestor  for cholesterol management - Currently taking cilostazol  for circulation issues          Relevant past medical, surgical, family and social history reviewed and updated as indicated. Interim medical history since our last visit reviewed. Allergies and medications reviewed and updated.  Review of Systems  Constitutional:  Negative for chills and fever.  Eyes:  Negative for visual disturbance.  Respiratory:  Negative for shortness of breath and wheezing.   Cardiovascular:  Negative for chest pain and leg swelling.  Genitourinary:  Positive for frequency.  Musculoskeletal:   Negative for back pain and gait problem.  Skin:  Negative for rash.  Neurological:  Negative for dizziness and light-headedness.  All other systems reviewed and are negative.   Per HPI unless specifically indicated above   Allergies as of 06/29/2024   No Known Allergies      Medication List        Accurate as of June 29, 2024  9:48 AM. If you have any questions, ask your nurse or doctor.          Accu-Chek Guide test strip Generic drug: glucose blood Use as instructed to test blood sugar daily. DX: E11.65 (patient using guide meter now)   amLODipine  10 MG tablet Commonly known as: NORVASC  Take 1 tablet (10 mg total) by mouth daily.   aspirin  EC 81 MG tablet Take 81 mg by mouth daily.   Basaglar  KwikPen 100 UNIT/ML Inject 70 Units into the skin daily.   cilostazol  100 MG tablet Commonly known as: PLETAL  Take 1 tablet (100 mg total) by mouth 2 (two) times daily. What changed: See the new instructions. Changed by: Fonda Levins, MD   citalopram  40 MG tablet Commonly known as: CELEXA  Take 1 tablet (40 mg total) by mouth daily.   glucose monitoring kit monitoring kit 1 each by Does not apply route 2 (two) times daily before a meal.   GNP UltiCare Pen Needles 32G X 6 MM Misc Generic drug: Insulin  Pen Needle Use 3 times daily DX: E11.65   hydrochlorothiazide  25 MG tablet Commonly known as: HYDRODIURIL  Take 1 tablet (25 mg total) by mouth  daily.   hydrOXYzine  50 MG capsule Commonly known as: VISTARIL  TAKE ONE TABLET BY MOUTH THREE TIMES DAILY AS NEEDED   insulin  lispro 100 UNIT/ML KwikPen Commonly known as: HumaLOG  KwikPen Inject 15 Units into the skin 3 (three) times daily before meals. Inject up to 50 units subcutaneously daily as instructed.   lidocaine  5 % ointment Commonly known as: XYLOCAINE  Apply 1 application topically 3 (three) times daily as needed.   losartan  100 MG tablet Commonly known as: COZAAR  Take 1 tablet (100 mg total) by mouth  daily.   metoprolol  succinate 25 MG 24 hr tablet Commonly known as: TOPROL -XL Take 1 tablet (25 mg total) by mouth daily. Started by: Fonda Levins, MD   Potassium Chloride  ER 20 MEQ Tbcr Take 20 mEq by mouth daily.   rosuvastatin  40 MG tablet Commonly known as: CRESTOR  Take 1 tablet (40 mg total) by mouth daily.   tamsulosin  0.4 MG Caps capsule Commonly known as: FLOMAX  Take 1 capsule (0.4 mg total) by mouth daily.   Trulicity  4.5 MG/0.5ML Soaj Generic drug: Dulaglutide  Inject 4.5 mg into the skin once a week.         Objective:   BP (!) 165/67   Pulse 69   Ht 5' 9 (1.753 m)   Wt 197 lb (89.4 kg)   SpO2 97%   BMI 29.09 kg/m   Wt Readings from Last 3 Encounters:  06/29/24 197 lb (89.4 kg)  03/23/24 193 lb (87.5 kg)  08/26/23 192 lb (87.1 kg)    Physical Exam Vitals and nursing note reviewed.  Constitutional:      Appearance: Normal appearance. He is obese.  Neurological:     Mental Status: He is alert.    Physical Exam   NECK: Thyroid  without nodules. CHEST: Lungs clear to auscultation. CARDIOVASCULAR: Regular heart rate, no murmurs.         Assessment & Plan:   Problem List Items Addressed This Visit       Cardiovascular and Mediastinum   Hypertension associated with diabetes (HCC)   Relevant Medications   metoprolol  succinate (TOPROL -XL) 25 MG 24 hr tablet     Endocrine   Type 2 diabetes mellitus with other specified complication (HCC) - Primary   Hyperlipidemia associated with type 2 diabetes mellitus (HCC)   Relevant Medications   metoprolol  succinate (TOPROL -XL) 25 MG 24 hr tablet   Other Visit Diagnoses       Colon cancer screening       Relevant Orders   Cologuard          Type 2 diabetes mellitus with other specified complication Blood sugars elevated, morning readings in 170s. Frequent urination likely due to hyperglycemia. - Increase Humalog  to 12-13 units with meals. - Continue Basaglar  at 70 units. - Continue  Trulicity  at 4.5 mg. - Bring glucose meter to next appointment for review.  Hypertension associated with diabetes Blood pressure elevated, readings around 156/70 mmHg. Current medications at maximum doses. - Consider adding additional antihypertensive medication.  Hyperlipidemia associated with type 2 diabetes mellitus Continues on Crestor  for cholesterol management. - Continue Crestor .     Added metoprolol  25 mg daily     Follow up plan: Return in about 3 months (around 09/27/2024), or if symptoms worsen or fail to improve, for Diabetes recheck.  Counseling provided for all of the vaccine components Orders Placed This Encounter  Procedures   Cologuard    Fonda Levins, MD [facility] 06/29/2024, 9:48 AM     "

## 2024-10-12 ENCOUNTER — Ambulatory Visit: Admitting: Family Medicine
